# Patient Record
Sex: Female | Born: 1960 | Race: White | Hispanic: No | Marital: Married | Smoking: Former smoker
Health system: Southern US, Community
[De-identification: ages and names within clinical notes are randomized; demographics above are authoritative.]

## PROBLEM LIST (undated history)

## (undated) DIAGNOSIS — M1710 Unilateral primary osteoarthritis, unspecified knee: Secondary | ICD-10-CM

## (undated) DIAGNOSIS — G4700 Insomnia, unspecified: Secondary | ICD-10-CM

## (undated) DIAGNOSIS — Z5189 Encounter for other specified aftercare: Secondary | ICD-10-CM

## (undated) DIAGNOSIS — T4145XA Adverse effect of unspecified anesthetic, initial encounter: Secondary | ICD-10-CM

## (undated) DIAGNOSIS — R809 Proteinuria, unspecified: Secondary | ICD-10-CM

## (undated) DIAGNOSIS — T7840XA Allergy, unspecified, initial encounter: Secondary | ICD-10-CM

## (undated) DIAGNOSIS — I10 Essential (primary) hypertension: Secondary | ICD-10-CM

## (undated) DIAGNOSIS — C50919 Malignant neoplasm of unspecified site of unspecified female breast: Secondary | ICD-10-CM

## (undated) DIAGNOSIS — M179 Osteoarthritis of knee, unspecified: Secondary | ICD-10-CM

## (undated) DIAGNOSIS — F419 Anxiety disorder, unspecified: Secondary | ICD-10-CM

## (undated) DIAGNOSIS — Z9221 Personal history of antineoplastic chemotherapy: Secondary | ICD-10-CM

## (undated) DIAGNOSIS — A0472 Enterocolitis due to Clostridium difficile, not specified as recurrent: Secondary | ICD-10-CM

## (undated) DIAGNOSIS — M810 Age-related osteoporosis without current pathological fracture: Secondary | ICD-10-CM

## (undated) DIAGNOSIS — F329 Major depressive disorder, single episode, unspecified: Secondary | ICD-10-CM

## (undated) DIAGNOSIS — IMO0001 Reserved for inherently not codable concepts without codable children: Secondary | ICD-10-CM

## (undated) DIAGNOSIS — G4730 Sleep apnea, unspecified: Secondary | ICD-10-CM

## (undated) DIAGNOSIS — Z923 Personal history of irradiation: Secondary | ICD-10-CM

## (undated) DIAGNOSIS — E611 Iron deficiency: Secondary | ICD-10-CM

## (undated) DIAGNOSIS — K219 Gastro-esophageal reflux disease without esophagitis: Secondary | ICD-10-CM

## (undated) DIAGNOSIS — M722 Plantar fascial fibromatosis: Secondary | ICD-10-CM

## (undated) DIAGNOSIS — F32A Depression, unspecified: Secondary | ICD-10-CM

## (undated) DIAGNOSIS — E785 Hyperlipidemia, unspecified: Secondary | ICD-10-CM

## (undated) DIAGNOSIS — T8859XA Other complications of anesthesia, initial encounter: Secondary | ICD-10-CM

## (undated) HISTORY — PX: OTHER SURGICAL HISTORY: SHX169

## (undated) HISTORY — PX: MYRINGOTOMY: SUR874

## (undated) HISTORY — DX: Allergy, unspecified, initial encounter: T78.40XA

## (undated) HISTORY — DX: Hyperlipidemia, unspecified: E78.5

## (undated) HISTORY — DX: Essential (primary) hypertension: I10

## (undated) HISTORY — DX: Age-related osteoporosis without current pathological fracture: M81.0

## (undated) HISTORY — DX: Plantar fascial fibromatosis: M72.2

## (undated) HISTORY — PX: GASTRIC ROUX-EN-Y: SHX5262

## (undated) HISTORY — PX: COLONOSCOPY: SHX174

## (undated) HISTORY — PX: APPENDECTOMY: SHX54

## (undated) HISTORY — DX: Depression, unspecified: F32.A

## (undated) HISTORY — DX: Reserved for inherently not codable concepts without codable children: IMO0001

## (undated) HISTORY — DX: Iron deficiency: E61.1

## (undated) HISTORY — DX: Malignant neoplasm of unspecified site of unspecified female breast: C50.919

## (undated) HISTORY — DX: Osteoarthritis of knee, unspecified: M17.9

## (undated) HISTORY — DX: Enterocolitis due to Clostridium difficile, not specified as recurrent: A04.72

## (undated) HISTORY — DX: Major depressive disorder, single episode, unspecified: F32.9

## (undated) HISTORY — DX: Encounter for other specified aftercare: Z51.89

## (undated) HISTORY — DX: Insomnia, unspecified: G47.00

## (undated) HISTORY — DX: Anxiety disorder, unspecified: F41.9

## (undated) HISTORY — PX: TONSILLECTOMY AND ADENOIDECTOMY: SUR1326

## (undated) HISTORY — DX: Unilateral primary osteoarthritis, unspecified knee: M17.10

## (undated) HISTORY — DX: Sleep apnea, unspecified: G47.30

## (undated) HISTORY — DX: Proteinuria, unspecified: R80.9

---

## 1998-05-21 HISTORY — PX: TOTAL VAGINAL HYSTERECTOMY: SHX2548

## 1998-05-21 HISTORY — PX: ABDOMINAL HYSTERECTOMY: SHX81

## 1998-08-15 ENCOUNTER — Other Ambulatory Visit: Admission: RE | Admit: 1998-08-15 | Discharge: 1998-08-15 | Payer: Self-pay | Admitting: Obstetrics and Gynecology

## 1998-09-08 ENCOUNTER — Emergency Department (HOSPITAL_COMMUNITY): Admission: EM | Admit: 1998-09-08 | Discharge: 1998-09-08 | Payer: Self-pay | Admitting: Emergency Medicine

## 1998-09-08 ENCOUNTER — Encounter: Payer: Self-pay | Admitting: Emergency Medicine

## 1998-10-20 ENCOUNTER — Inpatient Hospital Stay (HOSPITAL_COMMUNITY): Admission: RE | Admit: 1998-10-20 | Discharge: 1998-10-22 | Payer: Self-pay | Admitting: Obstetrics and Gynecology

## 1999-05-22 DIAGNOSIS — C50919 Malignant neoplasm of unspecified site of unspecified female breast: Secondary | ICD-10-CM

## 1999-05-22 HISTORY — DX: Malignant neoplasm of unspecified site of unspecified female breast: C50.919

## 1999-09-18 ENCOUNTER — Other Ambulatory Visit: Admission: RE | Admit: 1999-09-18 | Discharge: 1999-09-18 | Payer: Self-pay | Admitting: *Deleted

## 1999-11-03 ENCOUNTER — Other Ambulatory Visit: Admission: RE | Admit: 1999-11-03 | Discharge: 1999-11-03 | Payer: Self-pay | Admitting: General Surgery

## 1999-11-03 ENCOUNTER — Encounter: Admission: RE | Admit: 1999-11-03 | Discharge: 1999-11-03 | Payer: Self-pay | Admitting: General Surgery

## 1999-11-03 ENCOUNTER — Encounter: Payer: Self-pay | Admitting: General Surgery

## 1999-11-08 ENCOUNTER — Encounter: Payer: Self-pay | Admitting: General Surgery

## 1999-11-09 ENCOUNTER — Encounter: Payer: Self-pay | Admitting: General Surgery

## 1999-11-09 ENCOUNTER — Ambulatory Visit (HOSPITAL_COMMUNITY): Admission: RE | Admit: 1999-11-09 | Discharge: 1999-11-09 | Payer: Self-pay | Admitting: General Surgery

## 1999-11-09 ENCOUNTER — Encounter (INDEPENDENT_AMBULATORY_CARE_PROVIDER_SITE_OTHER): Payer: Self-pay | Admitting: *Deleted

## 1999-11-19 HISTORY — PX: BREAST LUMPECTOMY: SHX2

## 1999-11-28 ENCOUNTER — Encounter: Admission: RE | Admit: 1999-11-28 | Discharge: 2000-02-26 | Payer: Self-pay | Admitting: *Deleted

## 1999-11-30 ENCOUNTER — Ambulatory Visit (HOSPITAL_COMMUNITY): Admission: RE | Admit: 1999-11-30 | Discharge: 1999-11-30 | Payer: Self-pay | Admitting: Oncology

## 1999-11-30 ENCOUNTER — Encounter: Payer: Self-pay | Admitting: Oncology

## 2000-02-27 ENCOUNTER — Encounter: Admission: RE | Admit: 2000-02-27 | Discharge: 2000-05-27 | Payer: Self-pay | Admitting: *Deleted

## 2000-06-27 ENCOUNTER — Ambulatory Visit (HOSPITAL_COMMUNITY): Admission: RE | Admit: 2000-06-27 | Discharge: 2000-06-27 | Payer: Self-pay | Admitting: *Deleted

## 2000-08-29 ENCOUNTER — Observation Stay (HOSPITAL_COMMUNITY): Admission: AD | Admit: 2000-08-29 | Discharge: 2000-08-30 | Payer: Self-pay | Admitting: Internal Medicine

## 2000-12-17 ENCOUNTER — Other Ambulatory Visit: Admission: RE | Admit: 2000-12-17 | Discharge: 2000-12-17 | Payer: Self-pay | Admitting: Obstetrics and Gynecology

## 2001-01-29 ENCOUNTER — Encounter: Payer: Self-pay | Admitting: Internal Medicine

## 2001-01-29 ENCOUNTER — Ambulatory Visit (HOSPITAL_COMMUNITY): Admission: RE | Admit: 2001-01-29 | Discharge: 2001-01-29 | Payer: Self-pay | Admitting: Internal Medicine

## 2001-06-27 ENCOUNTER — Encounter: Admission: RE | Admit: 2001-06-27 | Discharge: 2001-09-25 | Payer: Self-pay | Admitting: *Deleted

## 2002-01-02 ENCOUNTER — Other Ambulatory Visit: Admission: RE | Admit: 2002-01-02 | Discharge: 2002-01-02 | Payer: Self-pay | Admitting: Obstetrics and Gynecology

## 2002-02-20 ENCOUNTER — Encounter: Payer: Self-pay | Admitting: *Deleted

## 2002-02-20 ENCOUNTER — Encounter: Admission: RE | Admit: 2002-02-20 | Discharge: 2002-02-20 | Payer: Self-pay | Admitting: *Deleted

## 2002-05-07 ENCOUNTER — Encounter: Admission: RE | Admit: 2002-05-07 | Discharge: 2002-08-05 | Payer: Self-pay | Admitting: Surgery

## 2002-05-18 ENCOUNTER — Encounter: Payer: Self-pay | Admitting: Surgery

## 2002-05-18 ENCOUNTER — Encounter: Admission: RE | Admit: 2002-05-18 | Discharge: 2002-05-18 | Payer: Self-pay | Admitting: Surgery

## 2002-06-02 ENCOUNTER — Ambulatory Visit (HOSPITAL_BASED_OUTPATIENT_CLINIC_OR_DEPARTMENT_OTHER): Admission: RE | Admit: 2002-06-02 | Discharge: 2002-06-02 | Payer: Self-pay | Admitting: Surgery

## 2002-06-02 ENCOUNTER — Encounter: Admission: RE | Admit: 2002-06-02 | Discharge: 2002-06-18 | Payer: Self-pay | Admitting: Surgery

## 2002-08-03 ENCOUNTER — Inpatient Hospital Stay (HOSPITAL_COMMUNITY): Admission: RE | Admit: 2002-08-03 | Discharge: 2002-08-08 | Payer: Self-pay | Admitting: Surgery

## 2002-08-04 ENCOUNTER — Encounter: Payer: Self-pay | Admitting: Surgery

## 2002-08-05 ENCOUNTER — Encounter: Payer: Self-pay | Admitting: Surgery

## 2002-08-20 ENCOUNTER — Encounter: Admission: RE | Admit: 2002-08-20 | Discharge: 2002-11-18 | Payer: Self-pay | Admitting: Surgery

## 2002-11-12 ENCOUNTER — Encounter: Payer: Self-pay | Admitting: Emergency Medicine

## 2002-11-12 ENCOUNTER — Emergency Department (HOSPITAL_COMMUNITY): Admission: EM | Admit: 2002-11-12 | Discharge: 2002-11-12 | Payer: Self-pay | Admitting: Emergency Medicine

## 2002-12-09 ENCOUNTER — Encounter: Admission: RE | Admit: 2002-12-09 | Discharge: 2003-03-09 | Payer: Self-pay | Admitting: Surgery

## 2002-12-20 HISTORY — PX: OTHER SURGICAL HISTORY: SHX169

## 2003-01-22 ENCOUNTER — Encounter: Admission: RE | Admit: 2003-01-22 | Discharge: 2003-01-22 | Payer: Self-pay | Admitting: *Deleted

## 2003-01-22 ENCOUNTER — Encounter: Payer: Self-pay | Admitting: *Deleted

## 2003-02-12 ENCOUNTER — Encounter: Payer: Self-pay | Admitting: *Deleted

## 2003-02-12 ENCOUNTER — Encounter: Admission: RE | Admit: 2003-02-12 | Discharge: 2003-02-12 | Payer: Self-pay | Admitting: *Deleted

## 2003-03-12 ENCOUNTER — Encounter: Payer: Self-pay | Admitting: Oncology

## 2003-03-12 ENCOUNTER — Ambulatory Visit (HOSPITAL_COMMUNITY): Admission: RE | Admit: 2003-03-12 | Discharge: 2003-03-12 | Payer: Self-pay | Admitting: Oncology

## 2003-04-28 ENCOUNTER — Other Ambulatory Visit: Admission: RE | Admit: 2003-04-28 | Discharge: 2003-04-28 | Payer: Self-pay | Admitting: Obstetrics and Gynecology

## 2003-06-21 ENCOUNTER — Encounter: Admission: RE | Admit: 2003-06-21 | Discharge: 2003-09-19 | Payer: Self-pay | Admitting: Obstetrics and Gynecology

## 2004-05-01 ENCOUNTER — Emergency Department (HOSPITAL_COMMUNITY): Admission: EM | Admit: 2004-05-01 | Discharge: 2004-05-01 | Payer: Self-pay | Admitting: Emergency Medicine

## 2004-08-25 ENCOUNTER — Ambulatory Visit: Payer: Self-pay | Admitting: Oncology

## 2004-12-19 ENCOUNTER — Ambulatory Visit: Payer: Self-pay | Admitting: Oncology

## 2005-04-24 ENCOUNTER — Encounter: Admission: RE | Admit: 2005-04-24 | Discharge: 2005-04-24 | Payer: Self-pay | Admitting: Family Medicine

## 2005-06-19 ENCOUNTER — Ambulatory Visit: Payer: Self-pay | Admitting: Oncology

## 2005-12-19 ENCOUNTER — Ambulatory Visit: Payer: Self-pay | Admitting: Oncology

## 2005-12-24 LAB — COMPREHENSIVE METABOLIC PANEL
AST: 24 U/L (ref 0–37)
Albumin: 4.5 g/dL (ref 3.5–5.2)
Alkaline Phosphatase: 70 U/L (ref 39–117)
BUN: 13 mg/dL (ref 6–23)
Creatinine, Ser: 0.6 mg/dL (ref 0.40–1.20)
Glucose, Bld: 105 mg/dL — ABNORMAL HIGH (ref 70–99)
Potassium: 4.1 mEq/L (ref 3.5–5.3)
Total Bilirubin: 0.3 mg/dL (ref 0.3–1.2)

## 2005-12-24 LAB — CBC WITH DIFFERENTIAL/PLATELET
BASO%: 0.3 % (ref 0.0–2.0)
EOS%: 2.1 % (ref 0.0–7.0)
HCT: 38.9 % (ref 34.8–46.6)
MCH: 29.9 pg (ref 26.0–34.0)
MCHC: 34.3 g/dL (ref 32.0–36.0)
MCV: 87.1 fL (ref 81.0–101.0)
MONO%: 9.8 % (ref 0.0–13.0)
NEUT%: 49.3 % (ref 39.6–76.8)
lymph#: 2.3 10*3/uL (ref 0.9–3.3)

## 2006-05-21 HISTORY — PX: FOOT SURGERY: SHX648

## 2006-06-20 ENCOUNTER — Ambulatory Visit: Payer: Self-pay | Admitting: Oncology

## 2006-07-01 LAB — CBC WITH DIFFERENTIAL/PLATELET
Basophils Absolute: 0 10*3/uL (ref 0.0–0.1)
Eosinophils Absolute: 0.1 10*3/uL (ref 0.0–0.5)
HCT: 36.6 % (ref 34.8–46.6)
HGB: 13 g/dL (ref 11.6–15.9)
LYMPH%: 34.6 % (ref 14.0–48.0)
MCV: 83.9 fL (ref 81.0–101.0)
MONO#: 0.5 10*3/uL (ref 0.1–0.9)
MONO%: 8.6 % (ref 0.0–13.0)
NEUT#: 3 10*3/uL (ref 1.5–6.5)
NEUT%: 53.9 % (ref 39.6–76.8)
Platelets: 285 10*3/uL (ref 145–400)
WBC: 5.6 10*3/uL (ref 3.9–10.0)

## 2006-07-05 LAB — LACTATE DEHYDROGENASE: LDH: 176 U/L (ref 94–250)

## 2006-07-05 LAB — COMPREHENSIVE METABOLIC PANEL
Albumin: 4.4 g/dL (ref 3.5–5.2)
Alkaline Phosphatase: 61 U/L (ref 39–117)
BUN: 15 mg/dL (ref 6–23)
CO2: 26 mEq/L (ref 19–32)
Glucose, Bld: 204 mg/dL — ABNORMAL HIGH (ref 70–99)
Total Bilirubin: 0.3 mg/dL (ref 0.3–1.2)
Total Protein: 6.6 g/dL (ref 6.0–8.3)

## 2006-07-05 LAB — VITAMIN D PNL(25-HYDRXY+1,25-DIHY)-BLD

## 2006-07-05 LAB — CANCER ANTIGEN 27.29: CA 27.29: 24 U/mL (ref 0–39)

## 2006-12-03 ENCOUNTER — Ambulatory Visit (HOSPITAL_COMMUNITY): Admission: RE | Admit: 2006-12-03 | Discharge: 2006-12-03 | Payer: Self-pay | Admitting: Oncology

## 2006-12-18 ENCOUNTER — Ambulatory Visit: Payer: Self-pay | Admitting: Oncology

## 2006-12-20 LAB — CBC WITH DIFFERENTIAL/PLATELET
BASO%: 0.5 % (ref 0.0–2.0)
Eosinophils Absolute: 0.2 10*3/uL (ref 0.0–0.5)
LYMPH%: 36.7 % (ref 14.0–48.0)
MCHC: 34.8 g/dL (ref 32.0–36.0)
MCV: 85.3 fL (ref 81.0–101.0)
MONO%: 7 % (ref 0.0–13.0)
NEUT#: 3.1 10*3/uL (ref 1.5–6.5)
Platelets: 265 10*3/uL (ref 145–400)
RBC: 4.42 10*6/uL (ref 3.70–5.32)
RDW: 13.4 % (ref 11.3–14.5)
WBC: 5.9 10*3/uL (ref 3.9–10.0)

## 2006-12-20 LAB — COMPREHENSIVE METABOLIC PANEL
ALT: 25 U/L (ref 0–35)
AST: 23 U/L (ref 0–37)
Albumin: 4.4 g/dL (ref 3.5–5.2)
Alkaline Phosphatase: 59 U/L (ref 39–117)
Potassium: 4.1 mEq/L (ref 3.5–5.3)
Sodium: 142 mEq/L (ref 135–145)
Total Bilirubin: 0.5 mg/dL (ref 0.3–1.2)
Total Protein: 6.4 g/dL (ref 6.0–8.3)

## 2007-01-01 LAB — ESTRADIOL, ULTRA SENS: Estradiol, Ultra Sensitive: <2 pg/mL

## 2010-10-06 NOTE — Procedures (Signed)
[facility]  Patient:    Julie Fischer, Julie Fischer                    MRN: [id number] Proc. Date: 06/27/00 Adm. Date:  [id number] Attending:  Sabino Gasser                           Procedure Report  PROCEDURE:  Colonoscopy.  INDICATION FOR PROCEDURE:  Rectal bleeding.  ANESTHESIA:  Demerol 90, Versed 7 mg.  DESCRIPTION OF PROCEDURE:  With the patient mildly sedated in the left lateral decubitus position, the Olympus videoscopic colonoscope was inserted in the rectum and passed under direct vision to the cecum identified by the ileocecal valve and appendiceal orifice both of which were photographed. From this point, the colonoscope was slowly withdrawn taking circumferential views of the entire colonic mucosa, stopping only in the rectum which appeared normal in direct and retroflexed view. The endoscope was straightened and withdrawn. The patients vital signs and pulse oximeter remained stable. The patient tolerated the procedure well without apparent complications.  FINDINGS:  Essentially negative colonoscopic examination to the cecum.  PLAN:  Consider repeat examination in five years. DD:  06/27/00 TD:  06/28/00 Job: [id number]

## 2010-10-06 NOTE — H&P (Signed)
[place]  Patient:    Julie Fischer, Julie Fischer                      MRN: [id number] Adm. Date:  08/29/00 Attending:  Jenel Lucks, M.D. Dictator:   Rickard Patience, P.A.                         History and Physical  DATE OF BIRTH:  [birth year]  CHIEF COMPLAINT:  Chest pain.  HISTORY OF PRESENT ILLNESS:  Julie Fischer is a 50 year old married, white, female patient of Dr. Jenel Lucks who presents to the office today complaining of a three to four-day history of nonradiation substernal chest pain. Approximately four days ago, she had an episode of fevers and chills accompanied by some diarrhea which subsided after about two days.  She cannot identify any alleviating or exacerbating factors along with her chest pain. She denies any significant dyspnea or shortness of breath.  Chest pain occurs at rest and with activity.  She has a past medical history of type 2 diabetes diagnosed in January 2002 and is also status post right breast cancer status post lumpectomy in January 2001.  She also has had chemotherapy for breast cancer and is currently on Prevacid secondary to that.  PAST MEDICAL HISTORY: 1. Sleep apnea. 2. Dyslipidemia including elevated triglycerides. 3. Hypertension. 4. Breast cancer status post lumpectomy. 5. History of migraines. 6. Obesity.  PAST SURGICAL HISTORY: 1. Partial hysterectomy secondary to endometriosis June 2000. 2. Appendectomy. 3. Tonsillectomy. 4. Adenoidectomy. 5. Myringotomy tube placement x 5.  ALLERGIES:  PENICILLIN.  CURRENT MEDICATIONS: 1. Paxil 20 mg daily. 2. Prevacid 30 mg daily. 3. Tamoxifen 10 mg 2 daily. 4. Actos 15 mg daily. 5. Glucotrol XL 5 mg b.i.d. 6. ______ 5 mg daily.  SOCIAL HISTORY:  The patient is married, lives in [place] with her husband. She has no children.  She works as a Quarry manager.  She does not currently smoke.  She has a remote history of smoking, having quit  in approximately 1993.  No alcohol use.  FAMILY HISTORY:  Negative for any breast cancer, colon cancer, or endometrial cancer.  Father deceased at age 37 secondary to pneumonia which was a complication of metastatic cancer.  Mother had a history of cerebral aneurysm which did hemorrhage and also had a seizure disorder.  No family history of diabetes.  Maternal grandparents had emphysema.  Maternal uncle and maternal grandfather had a history of alcoholism.  No known coronary artery disease history is in the family.  REVIEW OF SYSTEMS:  Currently, her chest pain is a 3 to 4/10, nonradiating, and substernal.  No acute dyspnea or any kind of cough.  She denies recent cold symptoms, sore throat, or any fevers or chills in the last 48 hours.  She denies any heartburn-type symptoms or reflux.  She has had no recent changes in her bowel or bladder habits.  The remainder of the Review of Systems is otherwise negative except as noted in the HPI.  PHYSICAL EXAMINATION:  VITAL SIGNS:  Today, her weight is 183.  Oxygen saturation is 98% on room air. Blood pressure 120/90, temperature 97.9, pulse 68 and regular sitting, respirations 16 and unlabored.  GENERAL:  Well-nourished, obese, black female in no acute distress.  SKIN:  Clear with no lesions.  HEENT:  Head is normocephalic.  Pupils are equal, round and reactive to light. Extraocular motions intact.  The patient wears glasses.  Her oropharynx is moist and clear.  NECK:  Supple with no JVD or thyromegaly noted.  CARDIOVASCULAR:   Normal S1 and S2 with no murmur, rub, or gallop.  Pulse is 68 and regular with the patient sitting.  LUNGS:  Clear to auscultation bilaterally without rales, rhonchi, or wheezing. Chest shape is normal, and respirations are unlabored.   Chest wall in nontender to compression today.  ABDOMEN:  She is obese with good bowel sounds throughout.  She is soft and nontender.  No organomegaly is noted  today.  GU/RECTAL:  Exams deferred.  EXTREMITIES:  Normal muscle mass.  No synovitis and no edema.  NEUROLOGIC:  Alert and oriented x 3.  Cranial nerves II-XII intact.  No tremors.  Neurologic exam is nonfocal.  LABORATORY DATA:  An EKG is done which shows normal sinus rhythm at 68 with a minimal ST segment elevation in the inferior leads II, III, and aVF.  This was reviewed with Dr. Lucas Mallow.  Chest x-ray shows possible borderline cardiomegaly, otherwise negative.  No acute infiltrates are noted.  This is sent along with the patient today.  IMPRESSION: 1. Unstable angina.  We will admit her for overnight observation to rule    out myocardial infarction and get cardiac enzymes on her today.  She is    given nitroglycerin x 3 in the office with no relief of her chest pain;    325 mg aspirin was also given in the office, and the patient was placed on    oxygen at 2 liters until she can be transported. 2. Diabetes mellitus.  Currently under fair control.  This has been newly    diagnosed, and she will follow up with Dr. Shelva Majestic. 3. History of hyperlipidemia. 4. Obesity. 5. Sleep apnea. 6. Breast cancer status post lumpectomy.  If the patient has elevated cardiac enzymes, cardiac consult will be obtained, and she will be followed by Dr. Jenel Lucks. DD:  08/29/00 TD:  08/29/00 Job: [id number]

## 2010-10-06 NOTE — Consult Note (Signed)
NAME:  Julie Fischer, Julie Fischer                       ACCOUNT NO.:  [id number]   MEDICAL RECORD NO.:  [id number]                   PATIENT TYPE:  INP   LOCATION:  0163                                 FACILITY:  [facility]   PHYSICIAN:  Genene Churn. Cyndie Chime, M.D.          DATE OF BIRTH:  1960/08/22   DATE OF CONSULTATION:  08/04/2002  DATE OF DISCHARGE:                                   CONSULTATION   HISTORY OF PRESENT ILLNESS:  The patient is a 50 year old female who  presented to [facility] on 08[facility]02, for routine elective  laparoscopic Roux-en-Y gastrojejunostomy for morbid obesity.  The patient  received 5000 units of heparin preoperatively.  Additionally, her  preoperative platelet count was found to be 346,000.  Heparin 5000 units  q.8h. was ordered for the patient postoperatively, and it seemed she  received two postoperative doses of this per her report.  Her platelet count  decreased to 72,000 on the morning of 08/04/02, and a repeat of this showed  platelet count of 36,000.  At 4 p.m., the platelet count was found to be  23,000.  We were asked to evaluate this patient for the abrupt onset of  thrombocytopenia.  The patient does not recall any prior heparin exposures  with her prior surgeries or in regards to treatment of her breast cancer.  She has no known bleeding or clotting problems.   PAST MEDICAL HISTORY:  1. 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  2. Type 2 diabetes x2 years.  3. Hyperlipidemia.  4. Morbid obesity/OHS.  5. History of hysterectomy at age 4 secondary to endometriosis.  6. History of appendectomy at age 1.  7. Gastroesophageal reflux disease.  8. Degenerative joint disease of the right knee.   ALLERGIES:  PENICILLIN causes rash and swelling.   MEDICATIONS PRIOR TO ADMISSION:  1. Glucotrol.  2. Altace.  3. Lipitor.  4. TriCor.  5. Tamoxifen, discontinued 1/04, in preparation for current surgery.  6. Prevacid.  7. Paxil.  8. Effexor.  9. Bextra, started three to four months ago, prior to this used ibuprofen     for pain control of her degenerative joint disease.   MEDICATIONS WHILE IN THE HOSPITAL:  1. Heparin.  2. Morphine.  3. Regular sliding scale insulin.  4. Protonix.  5. Phenergan.  6. Zofran.   FAMILY HISTORY:  Father is deceased secondary to complications of metastatic  cancer and pneumonia.  Mother is alive and has been treated for gastric  cancer.  She has three siblings in good health.  No children.  No known  family history of bleeding or clotting disorders.   SOCIAL HISTORY:  The patient is married.  No children.  She works  as a  Veterinary surgeon.  She is a former one pack a day smoker, quit approximately nine  years ago.  She reports occasional alcohol use in social situation.  No  recreational drug use.   REVIEW OF SYMPTOMS:  CONSTITUTIONAL:  Occasional fever and chills this month  secondary to having a cold.  No night sweats.  No weight loss.  RESPIRATORY:  Long-standing dyspnea on exertion, no orthopnea, no cough.  The patient uses CPAP at night for obstructive sleep apnea and has done so  for two years.  CARDIOVASCULAR:  No chest pain, no arrhythmia, no edema.  GASTROINTESTINAL:  No change in bowel habits, no melena, no hematochezia, no  nausea or vomiting.  GENITOURINARY:  No dysuria, no hematuria, no vaginal  bleeding.  MSK:  No complaints except for right knee pain secondary to  osteoarthritis.  HEME:  No history of anemia or clotting disorder.  ENDOCRINE:  No history of thyroid disease.  No changes in energy level.  NEUROLOGIC:  No current headache.  No history of seizures.  No paresthesias.  She reports occasional migraine headaches, approximately one time per month.   PHYSICAL EXAMINATION:  VITAL  SIGNS:  Temperature 98.6, blood pressure  160/84, pulse 104, respirations 20, O2 saturation 99% on 3 L.  GENERA:L  An obese pale female in no acute distress.  HEENT:  Normocephalic, atraumatic.  Oropharynx is clear.  NECK:  Supple without thyromegaly or lymphadenopathy.  CHEST:  There is faint bibasilar crackles at the bases.  Good air movement.  HEART:  Regular rate and rhythm with normal S1 and S2.  No murmurs, rubs, or  gallops.  ABDOMEN:  Soft, there is a Jackson-Pratt drain in place draining  serosanguinous fluid.  She has dressings from her prior surgery which are  clean, dry, and intact.  She is slightly tender at the left lower quadrant.  Hypoactive bowel sounds.  EXTREMITIES:  Trace edema.  NEUROLOGIC:  Strength is 5/5.  Otherwise nonfocal.  SKIN:  No petechiae.   LABORATORY DATA:  PT 19.9, INR 1.9, PTT 51, platelet count 23, fibrinogen  392, D-dimer 9.29.  White blood cell count 9.4, hemoglobin 12.9, hematocrit  37.3, MCV 84.  On the peripheral smear, there was 1 to 3 platelets per high  powered field and no schistocytes seen.  Sodium was 136, potassium 4,  chloride 104, bicarbonate 27, BUN 4, creatinine 0.7, calcium 8.1, glucose  116.  Liver function tests were within normal limits.  Urinalysis was within  normal limits.  No red blood cells noted.   ASSESSMENT AND PLAN:  This is a 50 year old female with abrupt onset of  thrombocytopenia, status post laparoscopic gastric bypass surgery and three  doses of subcutaneous heparin administration.  With no known prior heparin  exposures, HIT from preformed antibodies is unlikely.  HIT that is immune  mediated typically occurs four to 10 days after the initiation of heparin  therapy.  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  At this time, favor DIC related to surgery as the most likely  diagnosis.  Other considerations include atypical reaction to anesthetic  agents or Protonix.    RECOMMENDATIONS:  1. DIC panel.  2. Platelet aggregation studies.  3. Platelet factor 4 Eliza.  4. Platelet heparin associated antibody.  5. Discontinue heparin.  6. Discontinue Protonix.  7. Repeat coagulation studies in the morning along with fibrinogen and a     reticulocyte count.  If her coagulation studies  are still abnormal,     consider giving fresh frozen plasma.  8. Would treat with one unit of platelets and four units of fresh frozen     plasma tonight in light of possible need for surgical exploration     tomorrow.   Thank you for this interesting consult.  We will follow with you.      Hillery Aldo, M.D.                      Genene Churn. Cyndie Chime, M.D.    CR/MEDQ  D:  08/04/2002  T:  08/05/2002  Job:  [id number]   cc:   Sandria Bales. Ezzard Standing, M.D.  [address]  [place]  [place] [address]  Fax: [phone]   Pierce Crane, M.D.  [address] [facility]  [place]  [place] [address]  Fax: [phone]   Armstead Peaks, M.D.  [address] [place], [place] [address]  Fax: [phone]

## 2010-10-06 NOTE — Discharge Summary (Signed)
NAME:  Julie Fischer, Julie Fischer                       ACCOUNT NO.:  [id number]   MEDICAL RECORD NO.:  [id number]                   PATIENT TYPE:  INP   LOCATION:  0469                                 FACILITY:  [facility]   PHYSICIAN:  Sandria Bales. Ezzard Standing, M.D.               DATE OF BIRTH:  05-14-1961   DATE OF ADMISSION:  08/03/2002  DATE OF DISCHARGE:  08/08/2002                                 DISCHARGE SUMMARY   DISCHARGE DIAGNOSES:  1. Morbid obesity.  2. Postoperative disseminated intravascular coagulation (DIC).  3. Chronic obstructive pulmonary disease with continuous positive airway     pressure (CPAP).  4. Hypertension.  5. Gastroesophageal reflux disease.  6. Hypercholesterolemia.  7. Type 2 diabetes mellitus.  8. Stage I carcinoma of the right breast.  9. History of depression.   OPERATION PERFORMED:  The patient underwent a laparoscopic Roux-en-Y  gastrojejunostomy, which was antecolic and antegastric, enterolysis of  adhesions, esophagogastroscopy on August 03, 2002.   HISTORY OF ILLNESS:  Ms. Kolander is a pleasant 50 year old white female who  sees Dr. Armstead Peaks from [facility] and Dr. Mancel Bale  from OB/GYN who has been overweight since her teenage years.  Her weight is  approximately 200 pounds but she is only 4 feet 8 inches tall and a BMI of  45.  She has tried multiple weight loss methods which have been unsuccessful  and now comes for attempted bariatric surgery.   Most significantly she has multiple comorbid problems associated with her  weight which include COPD, hypertension, gastroesophageal reflux disease,  and type 2 diabetes mellitus.   She has been through all our preoperative staging.  She sees Dr. Cyndia Skeeters as a  Psychiatrist and has a good understanding both of the indications and  potential complications of the operation.   HOSPITAL COURSE:  On August 03, 2002, she underwent a laparoscopic Roux-en-Y  gastrojejunostomy which was done  antecolic and antegastric, she had  enterolysis of adhesions, and esophagogastroscopy all on the 15th of March.   Postoperatively, she had two problems.  One is her initial swallow postop  showed what looked like a partial bowel obstruction, but we gave this 24  hours, followed up with x-rays, and this all resolved.  Her second problem  was that she developed DIC picture, initially identified as a  thrombocytopenia which her platelets the first postop day were 72,000, they  dropped down to 36,000.  She also bumped up her PT to 16.5, PTT to 47.  Dr.  Julio Sicks helped me with the medical management of her medical problems,  and Dr. Cyndie Chime saw her along with Dr. Pierce Crane for her hematologic  problems.   We did lower extremity Dopplers the first postop day which were negative.  Surprisingly through all this she remained remarkably stable.  Her glucoses  stayed low postop off her medications.  Her blood pressure stayed stable.  So, I  have held actually all her medicines at the time of discharge.   There over a few days postop slowly we did give her some FFP and some  platelets.  Slowly her PT and PTT returned to normal.  Her platelets started  increasing and again the whole cause of this DIC is somewhat of a mystery.  She is now five days postop.  Her hemoglobin is 10 and hematocrit 29, white  blood count of 8,400, and her platelets are 162,000.  Her PT is 14.9 and PTT  of 35.  Her glucose today is 97.   I think she is ready for discharge.  She is on our diet, which is mainly  liquids and Glucerna.  She will have some Roxicet liquid for pain.  She  should do no driving or heavy lifting for four to five days.  She has  staples in place.  I went on and removed her Jackson-Pratt drain today.  She  will see me back next Wednesday, which would be about March 24.  She knows  to call for any interval problems.  She has also already made an appointment  to see Dr. Yetta Barre the first of April.   At that time, I think will have to  just review her medicines and make adjustments as she does with her weight  loss.                                               Sandria Bales. Ezzard Standing, M.D.    DHN/MEDQ  D:  08/08/2002  T:  08/08/2002  Job:  [id number]   cc:   Armstead Peaks, M.D.  [address] [place], [place] [address]  Fax: [phone]   Pierce Crane, M.D.  [address] - [facility]  [place]  [place] [address]  Fax: [phone]   Jackie Plum, M.D.  [address]  [place]  [place] [address]  Fax: [phone]   Duke Salvia. Marcelle Overlie, M.D.  [address]  [place]  [place] [address]  Fax: [phone]   Georgiana Spinner, M.D.  [address]  [place]  [place] [address]  Fax: [phone]   Vilinda Flake, Ph.D.

## 2010-10-06 NOTE — Op Note (Signed)
   NAME:  Julie Fischer, Julie Fischer                       ACCOUNT NO.:  [id number]   MEDICAL RECORD NO.:  [id number]                   PATIENT TYPE:  INP   LOCATION:  X001                                 [facility]ACILITY[facility]  [facility]   PHYSICIAN:  Sandria Bales. Ezzard Standing, M.D.               DATE OF BIRTH:  Sep 22, 1960   DATE OF PROCEDURE:  08/03/2002  DATE OF DISCHARGE:                                 OPERATIVE REPORT   HISTORY:  She is a 50 year old white female,who has been morbidly obese for  a number of years.  She has multiple comorbid conditions and now comes for  laparoscopic Roux-Y gastrojejunostomy.   PREOPERATIVE DIAGNOSIS:  Morbid obesity.   POSTOPERATIVE DIAGNOSIS:  Morbid obesity.   PROCEDURE:  Laparoscopic Roux-en-Y, antecolic/antegastric gastrojejunostomy,  upper endoscopy.  Dictation ends at this point.                                                 Sandria Bales. Ezzard Standing, M.D.    DHN/MEDQ  D:  08/03/2002  T:  08/03/2002  Job:  [id number]   cc:   Armstead Peaks, M.D.  [address] [place], [place] [address]  Fax: [phone]   Dr. Wilburn Cornelia M. Marcelle Overlie, M.D.  [address]  [place]  [place] [address]  Fax: [phone]   Georgiana Spinner, M.D.  [address]  [place]  [place] [address]  Fax: [phone]   Pierce Crane, M.D.  [address] [facility]  [place]  [place] [address]  Fax: [phone]

## 2010-11-07 ENCOUNTER — Encounter: Payer: Self-pay | Admitting: *Deleted

## 2010-11-15 ENCOUNTER — Other Ambulatory Visit: Payer: Self-pay | Admitting: Family Medicine

## 2010-11-15 ENCOUNTER — Ambulatory Visit (INDEPENDENT_AMBULATORY_CARE_PROVIDER_SITE_OTHER): Payer: 59 | Admitting: Family Medicine

## 2010-11-15 ENCOUNTER — Encounter: Payer: Self-pay | Admitting: Family Medicine

## 2010-11-15 ENCOUNTER — Other Ambulatory Visit: Payer: Self-pay | Admitting: *Deleted

## 2010-11-15 VITALS — BP 150/90 | HR 72 | Ht <= 58 in | Wt 153.0 lb

## 2010-11-15 DIAGNOSIS — Z853 Personal history of malignant neoplasm of breast: Secondary | ICD-10-CM | POA: Insufficient documentation

## 2010-11-15 DIAGNOSIS — E119 Type 2 diabetes mellitus without complications: Secondary | ICD-10-CM

## 2010-11-15 DIAGNOSIS — Z23 Encounter for immunization: Secondary | ICD-10-CM

## 2010-11-15 DIAGNOSIS — G4700 Insomnia, unspecified: Secondary | ICD-10-CM | POA: Insufficient documentation

## 2010-11-15 DIAGNOSIS — M8580 Other specified disorders of bone density and structure, unspecified site: Secondary | ICD-10-CM

## 2010-11-15 DIAGNOSIS — E7800 Pure hypercholesterolemia, unspecified: Secondary | ICD-10-CM | POA: Insufficient documentation

## 2010-11-15 DIAGNOSIS — F329 Major depressive disorder, single episode, unspecified: Secondary | ICD-10-CM

## 2010-11-15 DIAGNOSIS — Z800 Family history of malignant neoplasm of digestive organs: Secondary | ICD-10-CM

## 2010-11-15 DIAGNOSIS — R413 Other amnesia: Secondary | ICD-10-CM

## 2010-11-15 DIAGNOSIS — Z0000 Encounter for general adult medical examination without abnormal findings: Secondary | ICD-10-CM

## 2010-11-15 DIAGNOSIS — F325 Major depressive disorder, single episode, in full remission: Secondary | ICD-10-CM | POA: Insufficient documentation

## 2010-11-15 DIAGNOSIS — M899 Disorder of bone, unspecified: Secondary | ICD-10-CM

## 2010-11-15 DIAGNOSIS — E559 Vitamin D deficiency, unspecified: Secondary | ICD-10-CM | POA: Insufficient documentation

## 2010-11-15 LAB — CBC WITH DIFFERENTIAL/PLATELET
Basophils Relative: 1 % (ref 0–1)
Eosinophils Absolute: 0.2 10*3/uL (ref 0.0–0.7)
HCT: 40 % (ref 36.0–46.0)
Hemoglobin: 13.1 g/dL (ref 12.0–15.0)
MCH: 28.2 pg (ref 26.0–34.0)
MCHC: 32.8 g/dL (ref 30.0–36.0)
Monocytes Absolute: 0.5 10*3/uL (ref 0.1–1.0)
Monocytes Relative: 8 % (ref 3–12)

## 2010-11-15 LAB — COMPREHENSIVE METABOLIC PANEL
Alkaline Phosphatase: 65 U/L (ref 39–117)
BUN: 10 mg/dL (ref 6–23)
Glucose, Bld: 93 mg/dL (ref 70–99)
Total Bilirubin: 0.4 mg/dL (ref 0.3–1.2)

## 2010-11-15 LAB — POCT URINALYSIS DIPSTICK
Ketones, UA: NEGATIVE
Protein, UA: NEGATIVE
Spec Grav, UA: 1.005
WBC, UA: NEGATIVE
pH, UA: 5

## 2010-11-15 LAB — LIPID PANEL
HDL: 57 mg/dL (ref >39)
LDL Cholesterol: 92 mg/dL (ref 0–99)
Triglycerides: 109 mg/dL (ref <150)
VLDL: 22 mg/dL (ref 0–40)

## 2010-11-15 LAB — VITAMIN B12: Vitamin B-12: 650 pg/mL (ref 211–911)

## 2010-11-15 MED ORDER — PAROXETINE HCL 40 MG PO TABS: 40.0000 mg | ORAL_TABLET | ORAL | Status: DC

## 2010-11-15 MED ORDER — SIMVASTATIN 20 MG PO TABS: 20.0000 mg | ORAL_TABLET | Freq: Every day | ORAL | Status: DC

## 2010-11-15 MED ORDER — ZOLPIDEM TARTRATE ER 12.5 MG PO TBCR: 12.5000 mg | EXTENDED_RELEASE_TABLET | Freq: Every evening | ORAL | Status: DC | PRN

## 2010-11-15 NOTE — Patient Instructions (Addendum)
Check blood pressure elsewhere (pharmacy).  Watch the sodium in your diet (try to limit to less than 2000mg   Daily).  Daily exercise and weight loss will also help your blood pressure.  Avoid decongestants (sinus medications) as these can raise your blood pressure.  Please write your BP down every time you check it, and mail/fax me a list of your blood pressures in the next 1-2 months.  Schedule an appointment with me if they are truly running >135/85.  Please schedule your mammogram, as well as your appointment with Dr. Donnie Coffin

## 2010-11-15 NOTE — Progress Notes (Addendum)
Julie Fischer is a 50 y.o. female who presents for a complete physical.  She has the following concerns: Possible lump on right breast, noticed in the last 6 weeks.  Not tender.  Last mammo was 07/2009. She has a h/o R breast cancer.  Also missed her yearly follow-up with Dr. Donnie Coffin, as she didn't seem to get a reminder from their office.  Also complaining of shin splints L>R for about 6 weeks.  Got new shoes after pain developed, and is using her orthotics in the shoes.  Hasn't made a difference.  Wears orthotics in all shoes, and doesn't seem to matter which shoes she's wearing.  Doesn't wear any high heeled shoes. Starts mid-day, may last for a couple of days, then self-resolves, but then may come back.  Denies any change in activity.  Was taking Bayer aspirin for the pain, which helped some, but then started causing epigastric stomach pain.  Stopped a week ago, and still having some residual pain   Immunization History  Administered Date(s) Administered  . Influenza Split 02/07/2009, 04/10/2010  . Pneumococcal Polysaccharide 06/04/2001  . Td 06/04/2001   Last Pap smear: s/p hysterectomy Last mammogram: 07/2009 Last colonoscopy: 2002 Last DEXA: through Dr. Donnie Coffin, showed osteopenia (per pt).  Last done 2009 Exercise:  Walks dog 1-2x/week Ophtho: yearly Dentist: twice yearly  Past Medical History  Diagnosis Date  . Breast cancer R breast(lumpectomy,chemo,radiation,tamoxifen) Dr.Rubin  . Hypertension resolved after weight loss surgery  . Diabetes mellitus resolved after weightr loss surgery  . Microalbuminuria h/o  . Iron deficiency h/o  . Depression   . Anxiety   . Sleep apnea   . Osteopenia (monitored by Dr Donnie Coffin)  . Plantar fasciitis (07') DrRegal  . Vitamin D deficiency   . OA (osteoarthritis) of knee   . Insomnia chronic    Past Surgical History  Procedure Date  . Breast lumpectomy right breast 11/1999  . Abdominal hysterectomy and RSO(endometriosis) 2000  . Appendectomy     . Cuboid stress fracture right 12/2002  . Left shoulder fracture repair (DrMortensen) 12/05  . 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 Myringotomy tubes B/L, multiple sets  . Foot surgery left 2008    History   Social History  . Marital Status: Married    Spouse Name: N/A    Number of Children: N/A  . Years of Education: N/A   Occupational History  . Not on file.   Social History Main Topics  . Smoking status: Former Smoker    Quit date: 01/28/1996  . Smokeless tobacco: Not on file  . Alcohol Use: Yes     glass of wine or beer maybe once a month.  . Drug Use: No  . Sexually Active: Not on file   Other Topics Concern  . Not on file   Social History Narrative  . No narrative on file    Family History  Problem Relation Age of Onset  . Dementia Mother   . Stomach cancer Mother 58  . Stroke Mother 3    due to aneurysm  . Macular degeneration Mother   . Cancer Mother     colon cancer  . Pancreatic cancer Father   . Hyperlipidemia Sister   . Hyperlipidemia Brother   . Diabetes Maternal Grandmother   . Hyperlipidemia Sister   . Breast cancer Cousin   . Breast cancer Cousin     Current outpatient prescriptions:Calcium Carbonate-Vitamin D (CALTRATE 600+D) 600-400 MG-UNIT per tablet, Take 1 tablet by mouth 2 (  two) times daily. , Disp: , Rfl: ;  Cholecalciferol (VITAMIN D) 400 UNITS capsule, Take 400 Units by mouth 2 (two) times daily.  , Disp: , Rfl: ;  CycloSPORINE (RESTASIS OP), Apply 1 drop to eye 2 (two) times daily.  , Disp: , Rfl: ;  Lysine 1000 MG TABS, Take 1 tablet by mouth daily.  , Disp: , Rfl:  Multiple Vitamins-Minerals (MULTIVITAMIN WITH MINERALS) tablet, Take 1 tablet by mouth daily.  , Disp: , Rfl: ;  Omega-3 Fatty Acids (FISH OIL) 1200 MG CAPS, Take 1 capsule by mouth daily. 2 nightly , Disp: , Rfl: ;  PARoxetine (PAXIL) 40 MG tablet, Take 1 tablet (40 mg total) by mouth every morning., Disp: 90 tablet, Rfl: 3;  simethicone (MYLICON) 80 MG chewable tablet,  Chew 80 mg by mouth as needed.  , Disp: , Rfl:  simvastatin (ZOCOR) 20 MG tablet, Take 1 tablet (20 mg total) by mouth at bedtime., Disp: 90 tablet, Rfl: 3;  vitamin C (ASCORBIC ACID) 500 MG tablet, Take 500 mg by mouth daily.  , Disp: , Rfl: ;  vitamin E 400 UNIT capsule, Take 400 Units by mouth daily.  , Disp: , Rfl: ;  zolpidem (AMBIEN CR) 12.5 MG CR tablet, Take 1 tablet (12.5 mg total) by mouth at bedtime as needed., Disp: 90 tablet, Rfl: 1 DISCONTD: Alpha-D-Galactosidase (BEANO) TABS, Take 1 tablet by mouth as needed.  , Disp: , Rfl: ;  DISCONTD: PARoxetine (PAXIL) 40 MG tablet, Take 40 mg by mouth every morning.  , Disp: , Rfl: ;  DISCONTD: simvastatin (ZOCOR) 20 MG tablet, Take 20 mg by mouth at bedtime.  , Disp: , Rfl: ;  DISCONTD: zolpidem (AMBIEN CR) 12.5 MG CR tablet, Take 12.5 mg by mouth at bedtime as needed.  , Disp: , Rfl:  letrozole (FEMARA) 2.5 MG tablet, Take 2.5 mg by mouth daily.  , Disp: , Rfl: ;  magnesium 30 MG tablet, Take 30 mg by mouth daily.  , Disp: , Rfl: ;  DISCONTD: cholecalciferol (VITAMIN D) 1000 UNITS tablet, Take 1,000 Units by mouth daily.  , Disp: , Rfl: ;  DISCONTD: Omega-3 Fatty Acids (FISH OIL) 1000 MG CAPS, Take 1 capsule by mouth 2 (two) times daily.  , Disp: , Rfl:   Allergies  Allergen Reactions  . Heparin Other (See Comments)    Low platelets  . Niacin And Related Other (See Comments)    Blotchy/itchy  . Penicillins Rash    ROS: The patient denies anorexia, fever, weight changes, headaches,  vision changes, decreased hearing, ear pain, sore throat, breast concerns, chest pain, palpitations, syncope, dyspnea on exertion, cough, swelling, vomiting, diarrhea, constipation, abdominal pain, melena, hematochezia, indigestion/heartburn, hematuria, incontinence, dysuria, vaginal bleeding, vaginal discharge, odor or itch, genital lesions,  numbness, tingling, weakness, tremor, suspicious skin lesions, depression, anxiety, abnormal bleeding/bruising, or enlarged  lymph nodes.  Some dizziness/vertigo associated with some nausea over the last few weeks. Occasional tinnitus, and some crackling in R ear. Allergies have also been flaring some recently, some frontal sinus pressure + shin splints, and left hip pain  PHYSICAL EXAM: BP 150/90  Pulse 72  Ht 4\' 9"  (1.448 m)  Wt 153 lb (69.4 kg)  BMI 33.11 kg/m2  General Appearance:    Alert, cooperative, no distress, appears stated age  Head:    Normocephalic, without obvious abnormality, atraumatic  Eyes:    PERRL, conjunctiva/corneas clear, EOM's intact, fundi    benign  Ears:    Normal TM's and  external ear canals. Blue PE tube R ear  Nose:   Nasal mucosa mildly edematous, sinuses mildly tender over frontal  Throat:   Lips, mucosa, and tongue normal; teeth and gums normal  Neck:   Supple, no lymphadenopathy;  thyroid:  no   enlargement/tenderness/nodules; no carotid   bruit or JVD  Back:    Spine nontender, no curvature, ROM normal, no CVA     tenderness  Lungs:     Clear to auscultation bilaterally without wheezes, rales or     ronchi; respirations unlabored  Chest Wall:    No tenderness or deformity   Heart:    Regular rate and rhythm, S1 and S2 normal, no murmur, rub   or gallop  Breast Exam:    R breast--s/p surgery with scarring and some skin changes related to previous radiation.  Area of concern is inferomedial breast.  I feel some fibroglandular changes along entire inferior R breast, with no focal mass. No tenderness, nipple discharge or inversion.      No axillary lymphadenopathy  Abdomen:     Soft, nondistended, normoactive bowel sounds,    no masses, no hepatosplenomegaly.  Very mild epigastric tenderness  Genitalia:    Normal external genitalia without lesions.  BUS and vagina normal;  Bimanual exam revealed surgically absent uterus. No adnexal masses. Mild tenderness at R adnexa/RLQ.  She has large overlying ecchymosis on skin in this area due to injury from dog.  No mass, rebound  tenderness or guarding  Rectal:    Normal tone, no masses or tenderness; guaiac negative stool  Extremities:   No clubbing, cyanosis or edema.  She is nontender over tibia and anterior tibialis muscles  Pulses:   2+ and symmetric all extremities  Skin:   Skin color, texture, turgor normal, no rashes or lesions  Lymph nodes:   Cervical, supraclavicular, and axillary nodes normal  Neurologic:   CNII-XII intact, normal strength, sensation and gait; reflexes 2+ and symmetric throughout          Psych:   Normal mood, affect, hygiene and grooming.    1. Routine general medical examination at a health care facility  POCT urinalysis dipstick, Visual acuity screening, CBC with Differential  2. Pure hypercholesterolemia  simvastatin (ZOCOR) 20 MG tablet, Lipid panel  3. Insomnia  zolpidem (AMBIEN CR) 12.5 MG CR tablet   controlled by Ambien CR  4. Depressive disorder, not elsewhere classified  PARoxetine (PAXIL) 40 MG tablet  5. Type II or unspecified type diabetes mellitus without mention of complication, not stated as uncontrolled  Comprehensive metabolic panel, Microalbumin / creatinine urine ratio   off medications/diet-controlled s/p gastric bypass surgery  6. Unspecified vitamin D deficiency  Vitamin D 25 hydroxy  7. Memory loss  Vitamin B12   mild; she relates has had some slight problems since chemo.  Check B12 level today  8. Need for Tdap vaccination  Tdap vaccine greater than or equal to 7yo IM  9. Need for pneumococcal vaccination  Pneumococcal polysaccharide vaccine 23-valent greater than or equal to 2yo subcutaneous/IM  10. Osteopenia  DG Bone Density   due for repeat DEXA (previously ordered by Dr. Donnie Coffin; will need records for comparison)  11. History of breast cancer     R breast.  Past due for f/u with Dr. Donnie Coffin and mammogram  12. Family history of colon cancer  Ambulatory referral to Gastroenterology  Shin splints--will keep better track to see if related to a particular shoe that  she is  wearing.  Taught stretches, recommend heat, NSAID prn (if able to tolerate--take with food, and avoid aspirin).  F/u with Dr. Charlsie Merles if ongoing problems  Colonoscopy referral to Dr. Virginia Rochester DEXA referral to breast center Mammogram to be scheduled, and patient to schedule follow up appt with Dr. Cornelius Moras be related to allergies.  Use OTC antihistamines, mucinex, +- sinus rinses.  Meclizine prn  Discussed monthly self breast exams and yearly mammograms; at least 30 minutes of aerobic activity at least 5 days/week; proper sunscreen use reviewed; healthy diet, including goals of calcium and vitamin D intake and alcohol recommendations (less than or equal to 1 drink/day) reviewed; regular seatbelt use; changing batteries in smoke detectors.  Immunization recommendations discussed--Tdap and pneumovax given today.  Yearly flu shots.  Colonoscopy recommendations reviewed--due now and referral done.  Hemoccult kit given

## 2010-11-16 ENCOUNTER — Other Ambulatory Visit: Payer: Self-pay | Admitting: Family Medicine

## 2010-11-16 ENCOUNTER — Encounter: Payer: Self-pay | Admitting: Gastroenterology

## 2010-11-16 ENCOUNTER — Encounter: Payer: Self-pay | Admitting: Family Medicine

## 2010-11-16 DIAGNOSIS — Z1231 Encounter for screening mammogram for malignant neoplasm of breast: Secondary | ICD-10-CM

## 2010-11-16 LAB — MICROALBUMIN / CREATININE URINE RATIO: Microalb, Ur: 0.89 mg/dL (ref 0.00–1.89)

## 2010-11-24 ENCOUNTER — Ambulatory Visit
Admission: RE | Admit: 2010-11-24 | Discharge: 2010-11-24 | Disposition: A | Discharge status: Home / Self Care | Payer: 59 | Source: Ambulatory Visit | Attending: Family Medicine | Admitting: Family Medicine

## 2010-11-24 DIAGNOSIS — Z1231 Encounter for screening mammogram for malignant neoplasm of breast: Secondary | ICD-10-CM

## 2010-11-24 DIAGNOSIS — M8580 Other specified disorders of bone density and structure, unspecified site: Secondary | ICD-10-CM

## 2010-11-29 ENCOUNTER — Telehealth: Payer: Self-pay | Admitting: *Deleted

## 2010-11-29 NOTE — Telephone Encounter (Signed)
Message copied by Melonie Florida on Wed Nov 29, 2010 10:47 AM ------      Message from: Lynelle Doctor, EVE      Created: Fri Nov 24, 2010  4:46 PM       Advise pt that bone density test shows osteoporosis.  There has been a significant decline in bone density since the comparison that was available for 05/2001.  She has had other DEXA scans in the interim through Dr. Renelda Loma office that we don't have--that is what they need to be compared to.  Please send copy of DEXA results to Dr. Donnie Coffin, and make sure that patient follows up with him (she is past due). Also please send copy of mammogram to Dr. Donnie Coffin

## 2010-11-29 NOTE — Telephone Encounter (Signed)
Spoke with patient, gave her bone density results. Faxed bone density and mammogram to Dr.Rubin, patient she will call and get appt ASAP with Dr.Rubin as she is overdue.

## 2010-12-18 ENCOUNTER — Ambulatory Visit (AMBULATORY_SURGERY_CENTER): Payer: 59 | Admitting: *Deleted

## 2010-12-18 VITALS — Ht <= 58 in | Wt 155.0 lb

## 2010-12-18 DIAGNOSIS — Z1211 Encounter for screening for malignant neoplasm of colon: Secondary | ICD-10-CM

## 2010-12-18 MED ORDER — PEG-KCL-NACL-NASULF-NA ASC-C 100 G PO SOLR: ORAL | Status: DC

## 2010-12-19 ENCOUNTER — Encounter: Payer: Self-pay | Admitting: Gastroenterology

## 2010-12-29 ENCOUNTER — Encounter: Payer: Self-pay | Admitting: Gastroenterology

## 2010-12-29 ENCOUNTER — Ambulatory Visit (AMBULATORY_SURGERY_CENTER): Payer: 59 | Admitting: Gastroenterology

## 2010-12-29 VITALS — BP 148/71 | HR 60 | Temp 98.0°F | Resp 25 | Ht <= 58 in | Wt 150.0 lb

## 2010-12-29 DIAGNOSIS — Z800 Family history of malignant neoplasm of digestive organs: Secondary | ICD-10-CM

## 2010-12-29 DIAGNOSIS — Z1211 Encounter for screening for malignant neoplasm of colon: Secondary | ICD-10-CM

## 2010-12-29 MED ORDER — SODIUM CHLORIDE 0.9 % IV SOLN: 500.0000 mL | INTRAVENOUS | Status: DC

## 2010-12-29 NOTE — Progress Notes (Signed)
At end of procedure pt with eyes open, no complaints of pain or discomfort. Watching monitor. EWM  Watched monitor and would close and open eyes. Asked if ok, any pain, pt said was fine. EWM  Pt tolerated procedure well. Denies pain, discomfort afterwards. EWM

## 2010-12-29 NOTE — Patient Instructions (Signed)
Discharge instructions given with verbal understanding.  Normal exam.  Resume previous medications. 

## 2011-01-01 ENCOUNTER — Telehealth: Payer: Self-pay | Admitting: *Deleted

## 2011-01-01 NOTE — Telephone Encounter (Signed)

## 2011-01-20 LAB — HM MAMMOGRAPHY: HM Mammogram: NORMAL

## 2011-01-30 HISTORY — PX: OTHER SURGICAL HISTORY: SHX169

## 2011-01-31 ENCOUNTER — Other Ambulatory Visit: Payer: Self-pay | Admitting: Oncology

## 2011-01-31 ENCOUNTER — Encounter (HOSPITAL_BASED_OUTPATIENT_CLINIC_OR_DEPARTMENT_OTHER): Payer: 59 | Admitting: Oncology

## 2011-01-31 ENCOUNTER — Other Ambulatory Visit: Payer: Self-pay | Admitting: Family Medicine

## 2011-01-31 DIAGNOSIS — C50919 Malignant neoplasm of unspecified site of unspecified female breast: Secondary | ICD-10-CM

## 2011-01-31 DIAGNOSIS — Z1231 Encounter for screening mammogram for malignant neoplasm of breast: Secondary | ICD-10-CM

## 2011-01-31 LAB — CBC WITH DIFFERENTIAL/PLATELET
BASO%: 0.2 % (ref 0.0–2.0)
EOS%: 1.1 % (ref 0.0–7.0)
HCT: 40.3 % (ref 34.8–46.6)
LYMPH%: 26 % (ref 14.0–49.7)
MCH: 29.3 pg (ref 25.1–34.0)
MCHC: 33.8 g/dL (ref 31.5–36.0)
MONO#: 1.3 10*3/uL — ABNORMAL HIGH (ref 0.1–0.9)
NEUT%: 62.8 % (ref 38.4–76.8)
Platelets: 282 10*3/uL (ref 145–400)
RBC: 4.66 10*6/uL (ref 3.70–5.45)
WBC: 13.6 10*3/uL — ABNORMAL HIGH (ref 3.9–10.3)
lymph#: 3.5 10*3/uL — ABNORMAL HIGH (ref 0.9–3.3)

## 2011-01-31 LAB — COMPREHENSIVE METABOLIC PANEL
ALT: 24 U/L (ref 0–35)
AST: 23 U/L (ref 0–37)
CO2: 29 mEq/L (ref 19–32)
Creatinine, Ser: 0.49 mg/dL — ABNORMAL LOW (ref 0.50–1.10)
Sodium: 139 mEq/L (ref 135–145)
Total Bilirubin: 0.2 mg/dL — ABNORMAL LOW (ref 0.3–1.2)
Total Protein: 7.3 g/dL (ref 6.0–8.3)

## 2011-02-08 ENCOUNTER — Encounter: Payer: Self-pay | Admitting: Medical

## 2011-02-28 ENCOUNTER — Ambulatory Visit (INDEPENDENT_AMBULATORY_CARE_PROVIDER_SITE_OTHER): Payer: 59 | Admitting: Family Medicine

## 2011-02-28 ENCOUNTER — Encounter: Payer: Self-pay | Admitting: Family Medicine

## 2011-02-28 VITALS — BP 130/84 | HR 80 | Temp 98.0°F | Ht <= 58 in | Wt 152.0 lb

## 2011-02-28 DIAGNOSIS — B349 Viral infection, unspecified: Secondary | ICD-10-CM

## 2011-02-28 DIAGNOSIS — B9789 Other viral agents as the cause of diseases classified elsewhere: Secondary | ICD-10-CM

## 2011-02-28 DIAGNOSIS — J309 Allergic rhinitis, unspecified: Secondary | ICD-10-CM

## 2011-02-28 NOTE — Progress Notes (Signed)
Chief Complaint:  sore/scratchy throat and sneezing since last Thursday, ears also feeling plugged  HPI: 6 days of scratchy throat, some chest congestion ("feels like there is a balloon in chest when I breathe"), and ears feel plugged.  Denies ear pain, drainage, some decreased hearing. Some ringing in the right ear. Right ear is a little worse than the left ear.  +sneezing.  Denies congestion or runny nose.  Sore throat, hurts to swallow.  Denies cough.  Denies sick contacts.  She has a history of fall allergies, but this feels a little different.  Has been using Mucinex with no significant improvement.   Had some diarrhea over the weekend.  Denies nausea, vomiting.  Some decreased appetite.  No skin rash.  Had some subjective feeling of fever and chills (didn't take temperature) 3 days ago, resolved.  Past Medical History  Diagnosis Date  . Breast cancer R breast(lumpectomy,chemo,radiation,tamoxifen) Dr.Rubin  . Hypertension resolved after weight loss surgery  . Diabetes mellitus resolved after weightr loss surgery  . Microalbuminuria h/o  . Iron deficiency h/o  . Depression   . Anxiety   . Sleep apnea   . Osteopenia (monitored by Dr Donnie Coffin)  . Plantar fasciitis (07') DrRegal    s/p surgical release 01/2011  . Vitamin D deficiency   . OA (osteoarthritis) of knee   . Insomnia chronic  . Allergy     fall seasonal  . Blood transfusion     Platlet transfusion when on heparin  . Hyperlipidemia     Past Surgical History  Procedure Date  . Breast lumpectomy right breast 11/1999  . Abdominal hysterectomy and RSO(endometriosis) 2000  . Appendectomy   . Cuboid stress fracture right 12/2002  . Left shoulder fracture repair (DrMortensen) 12/05  . 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 Myringotomy tubes B/L, multiple sets  . Foot surgery left 2008  . Colonoscopy   . Tonsillectomy and adenoidectomy   . Plantar fasciitis release 01/30/11    R foot, Dr. Charlsie Merles    History   Social History    . Marital Status: Married    Spouse Name: N/A    Number of Children: N/A  . Years of Education: N/A   Occupational History  . Not on file.   Social History Main Topics  . Smoking status: Former Smoker    Quit date: 01/28/1996  . Smokeless tobacco: Never Used  . Alcohol Use: Yes     glass of wine or beer maybe once a month.  . Drug Use: No  . Sexually Active: Not on file   Other Topics Concern  . Not on file   Social History Narrative  . No narrative on file    Family History  Problem Relation Age of Onset  . Dementia Mother   . Stroke Mother 42    due to aneurysm  . Macular degeneration Mother   . Cancer Mother     colon cancer  . Colon cancer Mother   . Pancreatic cancer Father   . Hyperlipidemia Sister   . Hyperlipidemia Brother   . Diabetes Maternal Grandmother   . Hyperlipidemia Sister   . Breast cancer Cousin   . Breast cancer Cousin     Current outpatient prescriptions:alendronate (FOSAMAX) 70 MG tablet, Take 70 mg by mouth every 7 (seven) days. Take with a full glass of water on an empty stomach. , Disp: , Rfl: ;  Calcium Carbonate-Vitamin D (CALTRATE 600+D) 600-400 MG-UNIT per tablet, Take 1 tablet by mouth 2 (  two) times daily. , Disp: , Rfl: ;  Cholecalciferol (VITAMIN D) 400 UNITS capsule, Take 400 Units by mouth 2 (two) times daily.  , Disp: , Rfl:  CycloSPORINE (RESTASIS OP), Apply 1 drop to eye 2 (two) times daily.  , Disp: , Rfl: ;  Lysine 1000 MG TABS, Take 1 tablet by mouth daily as needed. Cold sores, Disp: , Rfl: ;  magnesium 30 MG tablet, Take 30 mg by mouth daily.  , Disp: , Rfl: ;  Multiple Vitamins-Minerals (MULTIVITAMIN WITH MINERALS) tablet, Take 1 tablet by mouth daily.  , Disp: , Rfl:  Omega-3 Fatty Acids (FISH OIL) 1200 MG CAPS, Take 1 capsule by mouth daily. 2 nightly , Disp: , Rfl: ;  PARoxetine (PAXIL) 40 MG tablet, Take 1 tablet (40 mg total) by mouth every morning., Disp: 90 tablet, Rfl: 3;  simethicone (MYLICON) 80 MG chewable tablet,  Chew 80 mg by mouth as needed.  , Disp: , Rfl: ;  simvastatin (ZOCOR) 20 MG tablet, Take 1 tablet (20 mg total) by mouth at bedtime., Disp: 90 tablet, Rfl: 3 vitamin C (ASCORBIC ACID) 500 MG tablet, Take 500 mg by mouth daily.  , Disp: , Rfl: ;  vitamin E 400 UNIT capsule, Take 400 Units by mouth daily.  , Disp: , Rfl: ;  zolpidem (AMBIEN CR) 12.5 MG CR tablet, Take 1 tablet (12.5 mg total) by mouth at bedtime as needed., Disp: 90 tablet, Rfl: 1;  letrozole (FEMARA) 2.5 MG tablet, Take 2.5 mg by mouth daily.  , Disp: , Rfl:   Allergies  Allergen Reactions  . Heparin Other (See Comments)    Low platelets  . Niacin And Related Other (See Comments)    Blotchy/itchy  . Penicillins Rash   ROS:  See HPI  PHYSICAL EXAM: BP 130/84  Pulse 80  Temp(Src) 98 F (36.7 C) (Oral)  Ht 4\' 9"  (1.448 m)  Wt 152 lb (68.947 kg)  BMI 32.89 kg/m2 Well developed, pleasant female, in no distress.  No cough or congestion noted. HEENT: PERRL, EOMI, conjunctiva clear.  R ear--blue tube within wax within canal (cannot tell if still partly in TM).  L TM and EAC normal, with normal light reflex.  Nasal mucosa mildly edematous, clear mucus.  Sinuses nontender.  OP clear without erythema Neck: no lymphadenopathy or mass Heart: regular rate and rhythm without murmur Lungs: clear Abdomen: soft, nontender Skin: no rash Extremities: no edema.  Some soft tissue swelling R foot from recent PF release surgery  ASSESSMENT/PLAN:  1. Allergic rhinitis, cause unspecified   2. Viral syndrome    Suspect concomitant seasonal allergies, with possible virus (fevers and diarrhea).  Treat supportively with antihistamines (Claritin or Zyrtec), with occasional decongestant if needed (especially for ear discomfort).  Continue Mucinex for cough and chest congestion, if needed.  F/U if develops fever, worsening symptoms

## 2011-02-28 NOTE — Patient Instructions (Signed)
Suspect concomitant seasonal allergies, with possible virus (fevers and diarrhea).  Treat supportively with antihistamines (Claritin or Zyrtec), with occasional decongestant if needed (especially for ear discomfort).  Continue Mucinex for cough and chest congestion, if needed.  F/U if develops fever, worsening symptoms

## 2011-03-05 LAB — CREATININE, SERUM: GFR calc Af Amer: >60

## 2011-04-19 ENCOUNTER — Encounter: Payer: Self-pay | Admitting: Family Medicine

## 2011-04-19 ENCOUNTER — Ambulatory Visit (INDEPENDENT_AMBULATORY_CARE_PROVIDER_SITE_OTHER): Payer: 59 | Admitting: Family Medicine

## 2011-04-19 VITALS — BP 128/70 | HR 72 | Temp 98.3°F | Ht <= 58 in | Wt 152.0 lb

## 2011-04-19 DIAGNOSIS — M62838 Other muscle spasm: Secondary | ICD-10-CM

## 2011-04-19 DIAGNOSIS — J069 Acute upper respiratory infection, unspecified: Secondary | ICD-10-CM

## 2011-04-19 DIAGNOSIS — M25519 Pain in unspecified shoulder: Secondary | ICD-10-CM

## 2011-04-19 DIAGNOSIS — Z23 Encounter for immunization: Secondary | ICD-10-CM

## 2011-04-19 DIAGNOSIS — M25511 Pain in right shoulder: Secondary | ICD-10-CM

## 2011-04-19 MED ORDER — KETOROLAC TROMETHAMINE 60 MG/2ML IM SOLN
60.0000 mg | Freq: Once | INTRAMUSCULAR | Status: AC
  Administered 2011-04-19: 60 mg via INTRAMUSCULAR

## 2011-04-19 MED ORDER — CYCLOBENZAPRINE HCL 10 MG PO TABS: ORAL_TABLET | ORAL | Status: DC

## 2011-04-19 NOTE — Patient Instructions (Signed)
Heat, massage and stretches to neck and shoulder. F/u next week if not improving.  F/u immediately if high fever, shortness of breath, worsening symptoms Consider x-ray if cough isn't improving, or ongoing pain.  Mucinex DM for cough, avoid Nyquil since you will be taking Flexeril which is sedating. You may continue to use Vicodin if needed for severe pain You were given a Toradol shot, which is an anti-inflammatory.  Wait at least 6 hours before using other anti-inflammatories.  I would avoid aspirin products.  You may use aleve twice a day, taken with food.  Do not use Aleve long-term (due to your stomach surgery), but probably okay for short term, if taken with food and no stomach pain.

## 2011-04-19 NOTE — Progress Notes (Signed)
Chief complaint:  Cough since Sunday, chest tightness. Mucous yellowish-green. Right shoulder pain that radiates into her neck-painful to touch  HPI:  Started with pain in her chest, which radiated to her R shoulder last week.  Then she started with a cold, and developed a cough.  The cough seems to have worsened the shoulder pain, which radiates to the back of her arm to her R elbow, and up into her neck.  Some tingling down the arm and into the 3rd and 4th fingers.  Pain with moving her hand/fingers.  Denies weakness.  Has been using Vicodin, with some help, reducing pain from 10 to 7/10.  Has been using a heating back, without help.  Feels better if she has her R arm over her head, pain is worse when her arm is down.  [employer] hasn't helped much either.  Having chest congestion. Had a fever (tactile) for a few days, which has resolved.  Cough is not productive.  Cough was worse earlier in the week, but seems to be less coughing at night the last few nights.  Using Nyquil.  Denies sinus pressure, runny nose, head congestion.  Some shortness of breath, but also pain with breathing, in the shoulder and chest area.  Past Medical History  Diagnosis Date  . Breast cancer R breast(lumpectomy,chemo,radiation,tamoxifen) Dr.Rubin  . Hypertension resolved after weight loss surgery  . Diabetes mellitus resolved after weightr loss surgery  . Microalbuminuria h/o  . Iron deficiency h/o  . Depression   . Anxiety   . Sleep apnea   . Osteopenia (monitored by Dr Donnie Coffin)  . Plantar fasciitis (07') DrRegal    s/p surgical release 01/2011  . Vitamin D deficiency   . OA (osteoarthritis) of knee   . Insomnia chronic  . Allergy     fall seasonal  . Blood transfusion     Platlet transfusion when on heparin  . Hyperlipidemia     Past Surgical History  Procedure Date  . Breast lumpectomy right breast 11/1999  . Abdominal hysterectomy and RSO(endometriosis) 2000  . Appendectomy   . Cuboid stress  fracture right 12/2002  . Left shoulder fracture repair (DrMortensen) 12/05  . 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 Myringotomy tubes B/L, multiple sets  . Foot surgery left 2008  . Colonoscopy   . Tonsillectomy and adenoidectomy   . Plantar fasciitis release 01/30/11    R foot, Dr. Charlsie Merles    History   Social History  . Marital Status: Married    Spouse Name: N/A    Number of Children: N/A  . Years of Education: N/A   Occupational History  . Not on file.   Social History Main Topics  . Smoking status: Former Smoker    Quit date: 01/28/1996  . Smokeless tobacco: Never Used  . Alcohol Use: Yes     glass of wine or beer maybe once a month.  . Drug Use: No  . Sexually Active: Not on file   Other Topics Concern  . Not on file   Social History Narrative  . No narrative on file    Family History  Problem Relation Age of Onset  . Dementia Mother   . Stroke Mother 2    due to aneurysm  . Macular degeneration Mother   . Cancer Mother     colon cancer  . Colon cancer Mother   . Pancreatic cancer Father   . Hyperlipidemia Sister   . Hyperlipidemia Brother   .  Diabetes Maternal Grandmother   . Hyperlipidemia Sister   . Breast cancer Cousin   . Breast cancer Cousin    Current Outpatient Prescriptions on File Prior to Visit  Medication Sig Dispense Refill  . alendronate (FOSAMAX) 70 MG tablet Take 70 mg by mouth every 7 (seven) days. Take with a full glass of water on an empty stomach.       . Calcium Carbonate-Vitamin D (CALTRATE 600+D) 600-400 MG-UNIT per tablet Take 1 tablet by mouth 2 (two) times daily.       Marland Kitchen Lysine 1000 MG TABS Take 1 tablet by mouth daily as needed. Cold sores      . Multiple Vitamins-Minerals (MULTIVITAMIN WITH MINERALS) tablet Take 1 tablet by mouth daily.        . Omega-3 Fatty Acids (FISH OIL) 1200 MG CAPS Take 1 capsule by mouth daily. 2 nightly       . PARoxetine (PAXIL) 40 MG tablet Take 1 tablet (40 mg total) by mouth every morning.  90  tablet  3  . simethicone (MYLICON) 80 MG chewable tablet Chew 80 mg by mouth as needed.        . simvastatin (ZOCOR) 20 MG tablet Take 1 tablet (20 mg total) by mouth at bedtime.  90 tablet  3  . vitamin C (ASCORBIC ACID) 500 MG tablet Take 500 mg by mouth daily.        . vitamin E 400 UNIT capsule Take 400 Units by mouth daily.        Marland Kitchen zolpidem (AMBIEN CR) 12.5 MG CR tablet Take 1 tablet (12.5 mg total) by mouth at bedtime as needed.  90 tablet  1   No current facility-administered medications on file prior to visit.   Allergies  Allergen Reactions  . Heparin Other (See Comments)    Low platelets  . Niacin And Related Other (See Comments)    Blotchy/itchy  . Penicillins Rash   ROS:  See HPI.  No nausea, vomiting, diarrhea, skin rash. See HPI.  +chronic insomnia, treated with ambien  PHYSICAL EXAM: BP 128/70  Pulse 72  Temp(Src) 98.3 F (36.8 C) (Oral)  Ht 4\' 9"  (1.448 m)  Wt 152 lb (68.947 kg)  BMI 32.89 kg/m2 Well developed female, appearing in moderate discomfort, holding her right arm against her body HEENT:  PERRL, EOMI, conjunctiva clear.  TM's and EAC's normal.  OP clear.  Nasal mucosa normal without purulence--some thick white mucus noted in L nares.  Sinuses nontender Heart: regular rate and rhythm without murmur Lungs: clear bilaterally.  No wheezes, rales or ronchi Neck: spine nontender.  +tender and spasm of R paraspinous, trapezius and rhomboid muscles on R.  FROM of Right shoulder.  Normal strength, sensation, rotator cuff testing intact. Skin: no rash  ASSESSMENT/PLAN: 1. Shoulder pain, right  ketorolac (TORADOL) injection 60 mg  2. Muscle spasm  cyclobenzaprine (FLEXERIL) 10 MG tablet  3. URI (upper respiratory infection)    4. Need for prophylactic vaccination and inoculation against influenza  Flu vaccine greater than or equal to 3yo preservative free IM   toradol 60mg  given.  May use OTC Aleve sparingly (due to her history of bariatric surgery), not to  start until 6 hours after toradol injection.  May also use tylenol or vicodin if needed. rx flexeril 10 mg 1/2-1 tab every 8 hrs prn muscle spasm #20--counseled regarding sedating side effects Heat, massage and stretches recommended  F/u next week if not improving.  F/u immediately if high fever,  shortness of breath, worsening symptoms Consider x-ray if cough isn't improving, or ongoing pain.   Mucinex DM for cough, avoid Nyquil since you will be taking Flexeril which is sedating. You may continue to use Vicodin if needed for severe pain You were given a Toradol shot, which is an anti-inflammatory.  Wait at least 6 hours before using other anti-inflammatories.  I would avoid aspirin products.  You may use aleve twice a day, taken with food.  Do not use Aleve long-term (due to your stomach surgery), but probably okay for short term, if taken with food and no stomach pain.

## 2011-04-23 ENCOUNTER — Telehealth: Payer: Self-pay | Admitting: Family Medicine

## 2011-04-24 ENCOUNTER — Encounter: Payer: Self-pay | Admitting: Internal Medicine

## 2011-04-24 NOTE — Telephone Encounter (Signed)
Noted.  Was advised to follow up this week if ongoing pain--appt Thurs

## 2011-04-26 ENCOUNTER — Ambulatory Visit (INDEPENDENT_AMBULATORY_CARE_PROVIDER_SITE_OTHER): Payer: 59 | Admitting: Family Medicine

## 2011-04-26 DIAGNOSIS — M25519 Pain in unspecified shoulder: Secondary | ICD-10-CM

## 2011-04-26 DIAGNOSIS — M25511 Pain in right shoulder: Secondary | ICD-10-CM

## 2011-04-26 DIAGNOSIS — M62838 Other muscle spasm: Secondary | ICD-10-CM

## 2011-04-26 MED ORDER — CYCLOBENZAPRINE HCL 10 MG PO TABS: ORAL_TABLET | ORAL | Status: DC

## 2011-04-26 MED ORDER — METHYLPREDNISOLONE 4 MG PO KIT: PACK | ORAL | Status: AC

## 2011-04-26 MED ORDER — HYDROCODONE-ACETAMINOPHEN 5-325 MG PO TABS: ORAL_TABLET | ORAL | Status: DC

## 2011-04-26 NOTE — Patient Instructions (Signed)
Referring to physical therapy.  Continue heat, massage, muscle relaxants.  STOP the Aleve if/when you start the oral steroids.  If your PT is scheduled for the next few days, you might want to give that a try before starting the steroids.  Start the steroids if appointment is far off, or if ongoing pain despite therapy

## 2011-04-26 NOTE — Progress Notes (Signed)
Patient presents with ongoing R shoulder pain.  URI symptoms have resolved. Taking Aleve twice a day, heat, Vicodin (leftover from foot surgery) and Flexeril--nothing seems to help decrease the pain in the right shoulder, which radiates down the right arm.  Denies numbness/tingling into arm or hand.  Pain doesn't radiate below the elbow.  Has pain at rest, worse with use of her right hand (brushing teeth, lifting things, any movement at all).  Pain is diminished some only if she takes the Flexeril, Aleve and Vicodin all together--pain 10/10 before meds, down to 7/10 after medications.  Sleep doesn't seem to be disturbed due to pain, but she takes Ambien at bedtime.  Doesn't awaken due to discomfort or pain  Past Medical History  Diagnosis Date  . Breast cancer R breast(lumpectomy,chemo,radiation,tamoxifen) Dr.Rubin  . Hypertension resolved after weight loss surgery  . Diabetes mellitus resolved after weightr loss surgery  . Microalbuminuria h/o  . Iron deficiency h/o  . Depression   . Anxiety   . Sleep apnea   . Osteopenia (monitored by Dr Donnie Coffin)  . Plantar fasciitis (07') DrRegal    s/p surgical release 01/2011  . Vitamin D deficiency   . OA (osteoarthritis) of knee   . Insomnia chronic  . Allergy     fall seasonal  . Blood transfusion     Platlet transfusion when on heparin  . Hyperlipidemia     Past Surgical History  Procedure Date  . Breast lumpectomy right breast 11/1999  . Abdominal hysterectomy and RSO(endometriosis) 2000  . Appendectomy   . Cuboid stress fracture right 12/2002  . Left shoulder fracture repair (DrMortensen) 12/05  . 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 Myringotomy tubes B/L, multiple sets  . Foot surgery left 2008  . Colonoscopy   . Tonsillectomy and adenoidectomy   . Plantar fasciitis release 01/30/11    R foot, Dr. Charlsie Merles    History   Social History  . Marital Status: Married    Spouse Name: N/A    Number of Children: N/A  . Years of Education: N/A     Occupational History  . Not on file.   Social History Main Topics  . Smoking status: Former Smoker    Quit date: 01/28/1996  . Smokeless tobacco: Never Used  . Alcohol Use: Yes     glass of wine or beer maybe once a month.  . Drug Use: No  . Sexually Active: Not on file   Other Topics Concern  . Not on file   Social History Narrative  . No narrative on file    Family History  Problem Relation Age of Onset  . Dementia Mother   . Stroke Mother 28    due to aneurysm  . Macular degeneration Mother   . Cancer Mother     colon cancer  . Colon cancer Mother   . Pancreatic cancer Father   . Cancer Father   . Hyperlipidemia Sister   . Hyperlipidemia Brother   . Diabetes Maternal Grandmother   . Hyperlipidemia Sister   . Breast cancer Cousin   . Breast cancer Cousin     Current outpatient prescriptions:alendronate (FOSAMAX) 70 MG tablet, Take 70 mg by mouth every 7 (seven) days. Take with a full glass of water on an empty stomach. , Disp: , Rfl: ;  Calcium Carbonate-Vitamin D (CALTRATE 600+D) 600-400 MG-UNIT per tablet, Take 1 tablet by mouth 2 (two) times daily. , Disp: , Rfl: ;  cholecalciferol (VITAMIN D) 1000 UNITS tablet,  Take 1,000 Units by mouth daily.  , Disp: , Rfl:  cyclobenzaprine (FLEXERIL) 10 MG tablet, 1/2 to 1 tablet every 8 hours as needed for muscle spasm, Disp: 40 tablet, Rfl: 0;  HYDROcodone-acetaminophen (NORCO) 5-325 MG per tablet, 1 tablet by mouth every 4-6 hours as needed for severe pain, Disp: 30 tablet, Rfl: 0;  Lysine 1000 MG TABS, Take 1 tablet by mouth daily as needed. Cold sores, Disp: , Rfl:  Multiple Vitamins-Minerals (MULTIVITAMIN WITH MINERALS) tablet, Take 1 tablet by mouth daily.  , Disp: , Rfl: ;  Omega-3 Fatty Acids (FISH OIL) 1200 MG CAPS, Take 1 capsule by mouth daily. 2 nightly , Disp: , Rfl: ;  PARoxetine (PAXIL) 40 MG tablet, Take 1 tablet (40 mg total) by mouth every morning., Disp: 90 tablet, Rfl: 3;  simethicone (MYLICON) 80 MG chewable  tablet, Chew 80 mg by mouth as needed.  , Disp: , Rfl:  simvastatin (ZOCOR) 20 MG tablet, Take 1 tablet (20 mg total) by mouth at bedtime., Disp: 90 tablet, Rfl: 3;  vitamin C (ASCORBIC ACID) 500 MG tablet, Take 500 mg by mouth daily.  , Disp: , Rfl: ;  vitamin E 400 UNIT capsule, Take 400 Units by mouth daily.  , Disp: , Rfl: ;  zolpidem (AMBIEN CR) 12.5 MG CR tablet, Take 1 tablet (12.5 mg total) by mouth at bedtime as needed., Disp: 90 tablet, Rfl: 1 methylPREDNISolone (MEDROL, PAK,) 4 MG tablet, follow package directions, Disp: 21 tablet, Rfl: 0  Allergies  Allergen Reactions  . Heparin Other (See Comments)    Low platelets  . Niacin And Related Other (See Comments)    Blotchy/itchy  . Penicillins Rash   ROS:  Denies fevers, URI symptoms, shortness of breath, chest pain, rash, edema  PHYSICAL EXAM: BP 120/86  Pulse 80  Ht 4\' 9"  (1.448 m)  Wt 157 lb (71.215 kg)  BMI 33.97 kg/m2 Doesn't appear to be in as much discomfort as last week, not holding her arms in odd position like last week (was the only way to be comfortable, holding her arm over her head last week) Less tender over R paraspinous muscles, but still very tender over R trapezius.  Only mild spasm Strength, sensation and DTR's normal C-spine nontender  ASSESSMENT/PLAN: 1. Muscle spasm  cyclobenzaprine (FLEXERIL) 10 MG tablet  2. Shoulder pain, right  HYDROcodone-acetaminophen (NORCO) 5-325 MG per tablet, methylPREDNISolone (MEDROL, PAK,) 4 MG tablet, Ambulatory referral to Physical Therapy   R shoulder pain--consistent with muscle spasm, possibly radiculopathy.  Failed conservative measures with heat, massage, NSAIDs and muscle relaxant.  Refer for physical therapy.  Trial of oral steroids --risks/side effects reviewed with patient.  If PT appt is very soon, may opt to hold of on starting steroid course.  F/u 2-3 weeks if not better

## 2011-05-02 ENCOUNTER — Telehealth: Payer: Self-pay | Admitting: Family Medicine

## 2011-05-02 NOTE — Telephone Encounter (Signed)
DONE

## 2011-05-17 ENCOUNTER — Ambulatory Visit: Payer: 59 | Admitting: Family Medicine

## 2011-06-11 ENCOUNTER — Telehealth: Payer: Self-pay | Admitting: Family Medicine

## 2011-06-11 DIAGNOSIS — G4700 Insomnia, unspecified: Secondary | ICD-10-CM

## 2011-06-11 MED ORDER — ZOLPIDEM TARTRATE ER 12.5 MG PO TBCR: 12.5000 mg | EXTENDED_RELEASE_TABLET | Freq: Every evening | ORAL | Status: DC | PRN

## 2011-06-11 NOTE — Telephone Encounter (Signed)
Okay for rx's (#90 for mail order)

## 2011-06-11 NOTE — Telephone Encounter (Signed)
Left message to let patient know that Ambien 12.5mg  #15 was called into [facility] [address] and her 90 day supply was called into [facility] for #90 with 1 refill per Dr.Knapp.

## 2011-06-14 ENCOUNTER — Encounter: Payer: Self-pay | Admitting: Family Medicine

## 2011-06-14 ENCOUNTER — Ambulatory Visit (INDEPENDENT_AMBULATORY_CARE_PROVIDER_SITE_OTHER): Payer: 59 | Admitting: Family Medicine

## 2011-06-14 DIAGNOSIS — G4700 Insomnia, unspecified: Secondary | ICD-10-CM

## 2011-06-14 DIAGNOSIS — J069 Acute upper respiratory infection, unspecified: Secondary | ICD-10-CM

## 2011-06-14 DIAGNOSIS — E119 Type 2 diabetes mellitus without complications: Secondary | ICD-10-CM

## 2011-06-14 DIAGNOSIS — E7800 Pure hypercholesterolemia, unspecified: Secondary | ICD-10-CM

## 2011-06-14 LAB — HEMOGLOBIN A1C
Hgb A1c MFr Bld: 6.1 % — ABNORMAL HIGH (ref <5.7)
Mean Plasma Glucose: 128 mg/dL — ABNORMAL HIGH (ref <117)

## 2011-06-14 LAB — LIPID PANEL
HDL: 47 mg/dL (ref >39)
LDL Cholesterol: 106 mg/dL — ABNORMAL HIGH (ref 0–99)
Total CHOL/HDL Ratio: 3.9 Ratio
VLDL: 31 mg/dL (ref 0–40)

## 2011-06-14 LAB — HEPATIC FUNCTION PANEL
ALT: 36 U/L — ABNORMAL HIGH (ref 0–35)
Albumin: 4.7 g/dL (ref 3.5–5.2)
Total Protein: 6.8 g/dL (ref 6.0–8.3)

## 2011-06-14 NOTE — Progress Notes (Signed)
Chief complaint: 6 month fasting follow up , pt requests that A1C be done with labs today. Would like refill on hydrocodone  HPI: Complaining of ongoing foot pain.  Had surgery on R foot by Dr. Charlsie Merles 01/30/11, but having some ongoing pain. Has a few pills left, but hasn't been back for f/u.  Diabetes follow-up:  Blood sugars are not checked elsewhere.  She has diet controlled diabetes, and has been off medications since her gastric bypass surgery. Denies polydipsia and polyuria.  Last eye exam was 03/2011.  Patient follows a low sugar diet and checks feet regularly without concerns. Exercising 15 minutes daily, limited by her right foot pain  Hyperlipidemia follow-up:  Patient is reportedly following a low-fat, low cholesterol diet.  Compliant with medications and denies medication side effects  Shoulder pain completely resolved with muscle relaxants and steroids--never needed to get PT  Depression--Well controlled, denies side effects of medications Insomnia--well controlled with Ambien CR, needs it nightly.  Patient complains of nasal congestion and sinus headaches x 2 days.  Using some tylenol sinus medication with improvement  Past Medical History  Diagnosis Date  . Breast cancer R breast(lumpectomy,chemo,radiation,tamoxifen) Dr.Rubin  . Hypertension resolved after weight loss surgery  . Diabetes mellitus resolved after weightr loss surgery  . Microalbuminuria h/o  . Iron deficiency h/o  . Depression   . Anxiety   . Sleep apnea   . Osteopenia (monitored by Dr Donnie Coffin)  . Plantar fasciitis (07') DrRegal    s/p surgical release 01/2011  . Vitamin D deficiency   . OA (osteoarthritis) of knee   . Insomnia chronic  . Allergy     fall seasonal  . Blood transfusion     Platlet transfusion when on heparin  . Hyperlipidemia     Past Surgical History  Procedure Date  . Breast lumpectomy right breast 11/1999  . Abdominal hysterectomy and RSO(endometriosis) 2000  . Appendectomy   .  Cuboid stress fracture right 12/2002  . Left shoulder fracture repair (DrMortensen) 12/05  . 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 Myringotomy tubes B/L, multiple sets  . Foot surgery left 2008  . Colonoscopy   . Tonsillectomy and adenoidectomy   . Plantar fasciitis release 01/30/11    R foot, Dr. Charlsie Merles    History   Social History  . Marital Status: Married    Spouse Name: N/A    Number of Children: N/A  . Years of Education: N/A   Occupational History  . Not on file.   Social History Main Topics  . Smoking status: Former Smoker    Quit date: 01/28/1996  . Smokeless tobacco: Never Used  . Alcohol Use: Yes     glass of wine or beer maybe once a month.  . Drug Use: No  . Sexually Active: Not on file   Other Topics Concern  . Not on file   Social History Narrative  . No narrative on file    Family History  Problem Relation Age of Onset  . Dementia Mother   . Stroke Mother 43    due to aneurysm  . Macular degeneration Mother   . Cancer Mother     colon cancer  . Colon cancer Mother   . Pancreatic cancer Father   . Cancer Father   . Hyperlipidemia Sister   . Hyperlipidemia Brother   . Diabetes Maternal Grandmother   . Hyperlipidemia Sister   . Breast cancer Cousin   . Breast cancer Cousin    Current Outpatient  Prescriptions on File Prior to Visit  Medication Sig Dispense Refill  . alendronate (FOSAMAX) 70 MG tablet Take 70 mg by mouth every 7 (seven) days. Take with a full glass of water on an empty stomach.       . Calcium Carbonate-Vitamin D (CALTRATE 600+D) 600-400 MG-UNIT per tablet Take 1 tablet by mouth 2 (two) times daily.       . cholecalciferol (VITAMIN D) 1000 UNITS tablet Take 1,000 Units by mouth daily.        Marland Kitchen HYDROcodone-acetaminophen (NORCO) 5-325 MG per tablet 1 tablet by mouth every 4-6 hours as needed for severe pain  30 tablet  0  . Lysine 1000 MG TABS Take 1 tablet by mouth daily as needed. Cold sores      . Multiple Vitamins-Minerals  (MULTIVITAMIN WITH MINERALS) tablet Take 1 tablet by mouth daily.        . Omega-3 Fatty Acids (FISH OIL) 1200 MG CAPS Take 1 capsule by mouth daily. 2 nightly       . PARoxetine (PAXIL) 40 MG tablet Take 1 tablet (40 mg total) by mouth every morning.  90 tablet  3  . simethicone (MYLICON) 80 MG chewable tablet Chew 80 mg by mouth as needed.        . simvastatin (ZOCOR) 20 MG tablet Take 1 tablet (20 mg total) by mouth at bedtime.  90 tablet  3  . vitamin C (ASCORBIC ACID) 500 MG tablet Take 500 mg by mouth daily.        . vitamin E 400 UNIT capsule Take 400 Units by mouth daily.        Marland Kitchen zolpidem (AMBIEN CR) 12.5 MG CR tablet Take 1 tablet (12.5 mg total) by mouth at bedtime as needed.  15 tablet  0  . cyclobenzaprine (FLEXERIL) 10 MG tablet 1/2 to 1 tablet every 8 hours as needed for muscle spasm  40 tablet  0    Allergies  Allergen Reactions  . Heparin Other (See Comments)    Low platelets  . Niacin And Related Other (See Comments)    Blotchy/itchy  . Penicillins Rash   ROS:  Denies fevers, chest pain, shortness of breath, dizziness, skin rash, GI complaints or other concerns.  See HPI  PHYSICAL EXAM: BP 126/86  Pulse 68  Ht 4\' 9"  (1.448 m)  Wt 152 lb (68.947 kg)  BMI 32.89 kg/m2 Well developed, pleasant female, overweight, in no distress HEENT:  PERRL, EOMI, conjunctiva clear.  Nasal mucosa mildly edematous, with clear-white discharge, some recent bleeding on right. Sinuses mildly tender x 4.  OP clear.  TM's and EAC's normal. Neck: no lymphadenopathy, thyromegaly, carotid bruit or mass Heart: regular rate and rhythm without murmur Lungs: clear bilaterally Abdomen: soft, nontender, no organomegaly or mass Extremities: no edema, 2+ pulse Normal diabetic foot exam--see other section Skin: no rash Psych: normal mood, affect, hygiene and grooming  ASSESSMENT/PLAN: 1. Insomnia    2. Pure hypercholesterolemia  Lipid panel, Hepatic function panel  3. Type II or unspecified type  diabetes mellitus without mention of complication, not stated as uncontrolled  Hemoglobin A1c, Glucose, random  4. Acute upper respiratory infections of unspecified site      Obesity--further weight loss encouraged Increase exercise to minimum of 30 minutes daily. Continue to watch diet, and portion sizes  DM--diet controlled,  Check A1c today  Hyperlipidemia--labs due  URI--continue decongestants as needed for sinus pain.  Consider sinus rinses or Neti-pot, and use of Mucinex (or other  guaifenesin product) to help thin out the mucus. Contact us if symptoms persisting/worsening after 7-10 days of symptoms

## 2011-06-14 NOTE — Patient Instructions (Signed)
Increase exercise to minimum of 30 minutes daily. Continue to watch diet, and portion sizes  URI--continue decongestants as needed for sinus pain.  Consider sinus rinses or Neti-pot, and use of Mucinex (or other guaifenesin product) to help thin out the mucus. Contact us if symptoms persisting/worsening after 7-10 days of symptoms

## 2011-06-18 ENCOUNTER — Telehealth: Payer: Self-pay | Admitting: *Deleted

## 2011-06-18 ENCOUNTER — Other Ambulatory Visit: Payer: Self-pay | Admitting: *Deleted

## 2011-06-18 DIAGNOSIS — E7800 Pure hypercholesterolemia, unspecified: Secondary | ICD-10-CM

## 2011-06-18 DIAGNOSIS — E119 Type 2 diabetes mellitus without complications: Secondary | ICD-10-CM

## 2011-06-18 NOTE — Telephone Encounter (Signed)
Error

## 2011-07-04 ENCOUNTER — Ambulatory Visit (INDEPENDENT_AMBULATORY_CARE_PROVIDER_SITE_OTHER): Payer: 59 | Admitting: Medical

## 2011-07-04 ENCOUNTER — Encounter: Payer: Self-pay | Admitting: Medical

## 2011-07-04 VITALS — BP 128/80 | HR 68 | Temp 98.4°F | Resp 16 | Wt 152.0 lb

## 2011-07-04 DIAGNOSIS — J40 Bronchitis, not specified as acute or chronic: Secondary | ICD-10-CM

## 2011-07-04 MED ORDER — DOXYCYCLINE HYCLATE 100 MG PO TABS: 100.0000 mg | ORAL_TABLET | Freq: Two times a day (BID) | ORAL | Status: AC

## 2011-07-04 MED ORDER — METHYLPREDNISOLONE ACETATE 80 MG/ML IJ SUSP: 80.0000 mg | Freq: Once | INTRAMUSCULAR | Status: DC

## 2011-07-04 MED ORDER — METHYLPREDNISOLONE ACETATE PF 40 MG/ML IJ SUSP
80.0000 mg | Freq: Once | INTRAMUSCULAR | Status: AC
  Administered 2011-07-04: 80 mg via INTRAMUSCULAR

## 2011-07-04 MED ORDER — HYDROCODONE-HOMATROPINE 5-1.5 MG/5ML PO SYRP: 5.0000 mL | ORAL_SOLUTION | Freq: Four times a day (QID) | ORAL | Status: AC | PRN

## 2011-07-04 NOTE — Progress Notes (Signed)
Addended by: Janeice Robinson on: 07/04/2011 02:22 PM   Modules accepted: Orders

## 2011-07-04 NOTE — Progress Notes (Signed)
Subjective  Julie Fischer is a 51 y.o. female who presents for 1 week hx/o sore throat, runny nose, sneezing, nasal congestion as well as clear nasal discharge.   Started having cough, getting some phlegm.  Cough is keeping her up at night.  Having some ear fullness.  She notes some chest and back pain from all the coughing.   Having fever and chills intermittently.  Highest temp was 99.6.  +work sick contacts.  Using Vicks vapor rub, Nyquil.  No other aggravating or relieving factors.  No other c/o.  The following portions of the patient's history were reviewed and updated as appropriate: allergies, current medications, past family history, past medical history, past social history, past surgical history and problem list.  ROS: Gen.: No weight gain or weight change Skin: No rash GI: No nausea, vomiting, diarrhea Heart: No palpitations or edema Lungs: No shortness of breath or wheezing GU: Negative   Objective:   Filed Vitals:   07/04/11 1334  BP: 128/80  Pulse: 68  Temp: 98.4 F (36.9 C)  Resp: 16    General appearance: Alert, WD/WN, no distress, ill appearing, bad frequent coughing spells                              Skin: warm, no rash, no diaphoresis                           Head: no sinus tenderness                            Eyes: conjunctiva normal, corneas clear, PERRLA                            Ears: left TM with erythema, right TM with tube in place, external ear canals normal                          Nose: septum midline, turbinates swollen, with erythema and clear discharge             Mouth/throat: MMM, tongue normal, mild pharyngeal erythema                           Neck: supple, no adenopathy, no thyromegaly, nontender                          Heart: RRR, normal S1, S2, no murmurs                         Lungs: +bronchial breath sounds, few +scattered rhonchi, no wheezes, no rales                Extremities: no edema, nontender     Assessment and Plan:    Encounter Diagnosis  Name Primary?  . Bronchitis Yes   Prescription given today for Doxycycline as below. Hycodan prn for worse cough.  Depo Medrol 80mg  IM today to help with cough/inflammation.  Discussed diagnosis and treatment of bronchitis.  Suggested symptomatic OTC remedies for cough and congestion.  Call/return in 2-3 days if symptoms are worse or not improving.  Advised that cough may linger even after the infection is improved.

## 2011-07-04 NOTE — Patient Instructions (Signed)
Acute Bronchitis Bronchitis is a problem of the air tubes leading to your lungs. Acute means the illness started quickly. In this condition, the lining of those tubes becomes puffy (swollen) and can leak fluid. This makes it harder for air to get in and out of your lungs. You may cough a lot. This is because the air tubes are narrow. Bronchitis is most often caused by a virus. Medicines that kill germs (antibiotics) may be needed with germ (bacteria) infections for people who:  Smoke.   Have lasting (chronic) lung problems.   Are elderly.  HOME CARE  Rest.   Drink enough water and fluids to keep the pee clear or pale yellow.   Only take medicine as told by your doctor.   Medicines may be prescribed that will open up the airways. This will help make breathing easier.   Bronchitis usually gets better on its own in a few days.  Recovery from some problems (symptoms) of bronchitis may be slow. You should start feeling a little better after 2 to 3 days. Coughing may last for 3 to 4 weeks. GET HELP RIGHT AWAY IF:  You or your child has a temperature by mouth above 101, not controlled by medicine.   Chills or chest pain develops.   You or your child develops very bad shortness of breath.   There is bloody saliva mixed with mucus (sputum).   You or your child throws up (vomits) often, loses too much fluid (dehydration), feels faint, or has a very bad headache.   You or your child does not improve after 1 week of treatment.  MAKE SURE YOU:   Understand these instructions.   Will watch this condition.   Will get help right away if you or your child is not doing well or gets worse.  Document Released: 10/24/2007 Document Re-Released: 08/01/2009 [employer] Patient Information 2011 [employer].  

## 2011-07-06 ENCOUNTER — Ambulatory Visit (INDEPENDENT_AMBULATORY_CARE_PROVIDER_SITE_OTHER): Payer: 59 | Admitting: Medical

## 2011-07-06 ENCOUNTER — Encounter: Payer: Self-pay | Admitting: Medical

## 2011-07-06 VITALS — BP 130/80 | HR 60 | Temp 98.1°F | Resp 16 | Wt 152.0 lb

## 2011-07-06 DIAGNOSIS — J40 Bronchitis, not specified as acute or chronic: Secondary | ICD-10-CM

## 2011-07-06 NOTE — Progress Notes (Signed)
Subjective  Julie Fischer is a 51 y.o. female who presents for recheck.  I saw her a few days ago for horrible cough and chest congestion.  She is using the antibiotic, Hycodan cough syrup.  She says she feels still fatigued, no real improvement, and not drinking a lot of fluids.  No appetite.  Denies fever, no SOB or wheezing, no CP or palpitations, no edema, no nausea, vomiting, diarrhea, no recent long travel, no recent surgery or procedure, no hx/o DVT/PE.  No other aggravating or relieving factors.  No other c/o.  The following portions of the patient's history were reviewed and updated as appropriate: allergies, current medications, past family history, past medical history, past social history, past surgical history and problem list.  ROS: Gen.: No weight gain or weight change Skin: No rash GI: No nausea, vomiting, diarrhea Heart: No palpitations or edema Lungs: No shortness of breath or wheezing GU: Negative   Objective:   Filed Vitals:   07/06/11 1436  BP: 130/80  Pulse: 60  Temp: 98.1 F (36.7 C)  Resp: 16    General appearance: Alert, WD/WN, no distress, and she did not cough at all while I was in exam room                             Skin: warm, no rash, no diaphoresis                           Head: no sinus tenderness                            Eyes: conjunctiva normal, corneas clear, PERRLA                            Ears: TMs pearly, right with tube in place, external ear canals normal                          Nose: septum midline, turbinates swollen, with erythema and clear discharge             Mouth/throat: MMM, tongue normal, mild pharyngeal erythema                           Neck: supple, no adenopathy, no thyromegaly, nontender                          Heart: RRR, normal S1, S2, no murmurs                         Lungs: CTA, no rhonchi, no wheezes, no rales                Extremities: no edema, nontender     Assessment and Plan:   Encounter Diagnosis    Name Primary?  . Bronchitis Yes   Overall she appears improved from a few days ago. The other day she could barely speak because of the coughing, today I did not hear even a single cough.  We talked about hydration, trying to stay hydrated, advise she finish the antibiotic, can still use Hycodan syrup for cough, and if worse or not improving call or return.  I gave her my  cell phone number to call in the event things are worse over the weekend.

## 2011-08-23 ENCOUNTER — Telehealth: Payer: Self-pay | Admitting: Internal Medicine

## 2011-08-23 DIAGNOSIS — G4700 Insomnia, unspecified: Secondary | ICD-10-CM

## 2011-08-23 NOTE — Telephone Encounter (Signed)
Ok to refill #90.   

## 2011-08-23 NOTE — Telephone Encounter (Signed)
This paper was for a prior auth, verified with Dr.Knapp and gave to Vernona Rieger to renew prior Berkley Harvey that is dated to expire 08/30/11. Refill was not necessary at this time.

## 2011-08-27 ENCOUNTER — Telehealth: Payer: Self-pay | Admitting: Family Medicine

## 2011-08-27 DIAGNOSIS — G4700 Insomnia, unspecified: Secondary | ICD-10-CM

## 2011-08-27 MED ORDER — ZOLPIDEM TARTRATE ER 12.5 MG PO TBCR: 12.5000 mg | EXTENDED_RELEASE_TABLET | Freq: Every evening | ORAL | Status: DC | PRN

## 2011-08-27 NOTE — Telephone Encounter (Signed)
Got prior auth approved and called in [place] for 90 days but when i talked to the guy she still had 1 refill as of 06/13/11 #90 so i called in #90 and it will be processed on 4/15 but will not be given to her til 4/23. However i have tried to contact pt to call [employer] to give them the account info to make payment arrangements before given med

## 2011-11-26 ENCOUNTER — Ambulatory Visit: Payer: 59

## 2011-12-03 ENCOUNTER — Other Ambulatory Visit: Payer: 59

## 2011-12-03 ENCOUNTER — Telehealth: Payer: Self-pay | Admitting: *Deleted

## 2011-12-03 ENCOUNTER — Telehealth: Payer: Self-pay | Admitting: Internal Medicine

## 2011-12-03 DIAGNOSIS — E7800 Pure hypercholesterolemia, unspecified: Secondary | ICD-10-CM

## 2011-12-03 DIAGNOSIS — E119 Type 2 diabetes mellitus without complications: Secondary | ICD-10-CM

## 2011-12-03 LAB — COMPREHENSIVE METABOLIC PANEL
ALT: 28 U/L (ref 0–35)
Albumin: 4.4 g/dL (ref 3.5–5.2)
Alkaline Phosphatase: 55 U/L (ref 39–117)
CO2: 30 mEq/L (ref 19–32)
Glucose, Bld: 105 mg/dL — ABNORMAL HIGH (ref 70–99)
Potassium: 4.2 mEq/L (ref 3.5–5.3)
Sodium: 143 mEq/L (ref 135–145)
Total Bilirubin: 0.3 mg/dL (ref 0.3–1.2)
Total Protein: 6.4 g/dL (ref 6.0–8.3)

## 2011-12-03 LAB — LIPID PANEL
Cholesterol: 154 mg/dL (ref 0–200)
Triglycerides: 123 mg/dL (ref <150)

## 2011-12-03 LAB — HEMOGLOBIN A1C: Mean Plasma Glucose: 134 mg/dL — ABNORMAL HIGH (ref <117)

## 2011-12-03 NOTE — Telephone Encounter (Signed)
Spoke with patient and she stated she has enough Ambien and an rx at her appt would be fine.

## 2011-12-03 NOTE — Telephone Encounter (Signed)
She has appt next week--I'd rather give her printed rx at her visit, unless she needs sooner (I believe it is a 6 month f/u visit, for rx refills)

## 2011-12-03 NOTE — Telephone Encounter (Signed)
Left message for patient to call me back to relay message to her and make sure she has enough Ambien to make it until her appt.

## 2011-12-04 LAB — MICROALBUMIN / CREATININE URINE RATIO
Creatinine, Urine: 96.1 mg/dL
Microalb, Ur: 1.82 mg/dL (ref 0.00–1.89)

## 2011-12-10 ENCOUNTER — Ambulatory Visit (INDEPENDENT_AMBULATORY_CARE_PROVIDER_SITE_OTHER): Payer: 59 | Admitting: Family Medicine

## 2011-12-10 ENCOUNTER — Encounter: Payer: Self-pay | Admitting: Family Medicine

## 2011-12-10 VITALS — BP 140/88 | HR 72 | Ht <= 58 in | Wt 152.0 lb

## 2011-12-10 DIAGNOSIS — Z0000 Encounter for general adult medical examination without abnormal findings: Secondary | ICD-10-CM

## 2011-12-10 DIAGNOSIS — R7301 Impaired fasting glucose: Secondary | ICD-10-CM

## 2011-12-10 DIAGNOSIS — E559 Vitamin D deficiency, unspecified: Secondary | ICD-10-CM

## 2011-12-10 DIAGNOSIS — E7800 Pure hypercholesterolemia, unspecified: Secondary | ICD-10-CM

## 2011-12-10 DIAGNOSIS — F329 Major depressive disorder, single episode, unspecified: Secondary | ICD-10-CM

## 2011-12-10 DIAGNOSIS — I10 Essential (primary) hypertension: Secondary | ICD-10-CM

## 2011-12-10 DIAGNOSIS — M8580 Other specified disorders of bone density and structure, unspecified site: Secondary | ICD-10-CM

## 2011-12-10 DIAGNOSIS — G4700 Insomnia, unspecified: Secondary | ICD-10-CM

## 2011-12-10 DIAGNOSIS — M949 Disorder of cartilage, unspecified: Secondary | ICD-10-CM

## 2011-12-10 LAB — POCT URINALYSIS DIPSTICK
Bilirubin, UA: NEGATIVE
Glucose, UA: NEGATIVE
Ketones, UA: NEGATIVE
Leukocytes, UA: NEGATIVE
Nitrite, UA: NEGATIVE
pH, UA: 5

## 2011-12-10 MED ORDER — PAROXETINE HCL 40 MG PO TABS: 40.0000 mg | ORAL_TABLET | ORAL | Status: DC

## 2011-12-10 MED ORDER — LISINOPRIL 10 MG PO TABS: 10.0000 mg | ORAL_TABLET | Freq: Every day | ORAL | Status: DC

## 2011-12-10 MED ORDER — SIMVASTATIN 20 MG PO TABS: 20.0000 mg | ORAL_TABLET | Freq: Every day | ORAL | Status: DC

## 2011-12-10 MED ORDER — ZOLPIDEM TARTRATE ER 12.5 MG PO TBCR: 12.5000 mg | EXTENDED_RELEASE_TABLET | Freq: Every evening | ORAL | Status: DC | PRN

## 2011-12-10 NOTE — Progress Notes (Signed)
Chief Complaint  Patient presents with  . Annual Exam    annual CPE/med check with pelvic exam.    Depression: Paxil doesn't seem to be working quite as well in the past, as sometimes she notices a little bit of depression creeping in more frequently than in past. Has some bad days, rarely has suicidal thoughts on those days, but no plan.  Hyperlipidemia follow-up:  Patient is reportedly following a low-fat, low cholesterol diet.  Compliant with medications and denies medication side effects.  Breast cancer: Saw Dr. Donnie Coffin last fall, and was told everything was good.  Due again this fall.  Denies any breast concerns.  Osteopenia/osteoporosis: Tolerating fosamax without problems.  Denies dysphagia or chest pain.  DEXA's done through Dr. Renelda Loma office  Insomnia: requires Ambien CR nightly.  Denies side effects or strange behaviors.  Needs refill  Health Maintenance: Immunization History  Administered Date(s) Administered  . Influenza Split 02/07/2009, 04/10/2010, 04/19/2011  . Pneumococcal Polysaccharide 06/04/2001, 11/15/2010  . Td 06/04/2001  . Tdap 11/15/2010   Last Pap smear: s/p hysterectomy Last mammogram: 01/2011 Last colonoscopy: 12/2010 Last DEXA: through Dr. Donnie Coffin, due to see him again in Fall Ophtho: yearly, 04/2011 Dentist: twice yearly Exercise: walks twice a week  Past Medical History  Diagnosis Date  . Breast cancer R breast(lumpectomy,chemo,radiation,tamoxifen) Dr.Rubin  . Hypertension resolved after weight loss surgery  . Diabetes mellitus resolved after weightr loss surgery  . Microalbuminuria h/o  . Iron deficiency h/o  . Depression   . Anxiety   . Sleep apnea   . Osteopenia (monitored by Dr Donnie Coffin)  . Plantar fasciitis (07') DrRegal    s/p surgical release 01/2011  . Vitamin d deficiency   . OA (osteoarthritis) of knee   . Insomnia chronic  . Allergy     fall seasonal  . Blood transfusion     Platlet transfusion when on heparin  . Hyperlipidemia      Past Surgical History  Procedure Date  . Breast lumpectomy right breast 11/1999  . Abdominal hysterectomy and RSO(endometriosis) 2000  . Appendectomy   . Cuboid stress fracture right 12/2002  . Left shoulder fracture repair (DrMortensen) 12/05  . 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 Myringotomy tubes B/L, multiple sets  . Foot surgery left 2008  . Colonoscopy 2002, 2012  . Tonsillectomy and adenoidectomy   . Plantar fasciitis release 01/30/11    R foot, Dr. Charlsie Merles    History   Social History  . Marital Status: Married    Spouse Name: N/A    Number of Children: N/A  . Years of Education: N/A   Occupational History  . Not on file.   Social History Main Topics  . Smoking status: Former Smoker    Quit date: 01/28/1996  . Smokeless tobacco: Never Used  . Alcohol Use: Yes     glass of wine or beer maybe once a month.  . Drug Use: No  . Sexually Active: Not on file   Other Topics Concern  . Not on file   Social History Narrative   Works at [employer], as a copy and Chartered loss adjuster.  Lives with husband, 2 dogs, 1 cat, 10 fish    Family History  Problem Relation Age of Onset  . Dementia Mother   . Stroke Mother 40    due to aneurysm  . Macular degeneration Mother   . Cancer Mother     colon cancer  . Colon cancer Mother   . Pancreatic cancer Father   .  Cancer Father   . Hyperlipidemia Sister   . Hyperlipidemia Brother   . Diabetes Brother   . Diabetes Maternal Grandmother   . Hyperlipidemia Sister   . Breast cancer Cousin   . Breast cancer Cousin    Current Outpatient Prescriptions on File Prior to Visit  Medication Sig Dispense Refill  . alendronate (FOSAMAX) 70 MG tablet Take 70 mg by mouth every 7 (seven) days. Take with a full glass of water on an empty stomach.       . Calcium Carbonate-Vitamin D (CALTRATE 600+D) 600-400 MG-UNIT per tablet Take 1 tablet by mouth 2 (two) times daily.       . cholecalciferol (VITAMIN D) 1000 UNITS tablet Take 1,000  Units by mouth daily.        Marland Kitchen Lysine 1000 MG TABS Take 1 tablet by mouth daily as needed. Cold sores      . Multiple Vitamins-Minerals (MULTIVITAMIN WITH MINERALS) tablet Take 1 tablet by mouth daily.        . Omega-3 Fatty Acids (FISH OIL) 1200 MG CAPS Take 1 capsule by mouth daily. 2 nightly       . simethicone (MYLICON) 80 MG chewable tablet Chew 80 mg by mouth as needed.        . vitamin C (ASCORBIC ACID) 500 MG tablet Take 500 mg by mouth daily.        . vitamin E 400 UNIT capsule Take 400 Units by mouth daily.        Marland Kitchen DISCONTD: PARoxetine (PAXIL) 40 MG tablet Take 1 tablet (40 mg total) by mouth every morning.  90 tablet  3  . DISCONTD: simvastatin (ZOCOR) 20 MG tablet Take 1 tablet (20 mg total) by mouth at bedtime.  90 tablet  3  . DISCONTD: zolpidem (AMBIEN CR) 12.5 MG CR tablet Take 1 tablet (12.5 mg total) by mouth at bedtime as needed.  90 tablet  0  . lisinopril (PRINIVIL,ZESTRIL) 10 MG tablet Take 1 tablet (10 mg total) by mouth daily.  30 tablet  1  (lisinopril just started today)  Allergies  Allergen Reactions  . Heparin Other (See Comments)    Low platelets  . Niacin And Related Other (See Comments)    Blotchy/itchy  . Penicillins Rash   ROS:  The patient denies anorexia, fever, weight changes, headaches,  vision changes, decreased hearing, ear pain, sore throat, breast concerns, chest pain, palpitations, dizziness, syncope, dyspnea on exertion, cough, swelling, nausea, vomiting, diarrhea, constipation, abdominal pain, melena, hematochezia, indigestion/heartburn, hematuria, incontinence, dysuria, vaginal bleeding, discharge, odor or itch, genital lesions, joint pains, numbness, tingling, weakness, tremor, suspicious skin lesions, depression, anxiety, abnormal bleeding/bruising, or enlarged lymph nodes.  Occasionally checks BP at [facility], and is slightly high--can't recall #'s. Previously, BP's had always been fine, but over the last 1-2 years it has been creeping  up.  PHYSICAL EXAM: BP 140/88  Pulse 72  Ht 4\' 10"  (1.473 m)  Wt 152 lb (68.947 kg)  BMI 31.77 kg/m2 160/100 on repeat by MD, [place] General Appearance:  Alert, cooperative, no distress, appears stated age   Head:  Normocephalic, without obvious abnormality, atraumatic   Eyes:  PERRL, conjunctiva/corneas clear, EOM's intact, fundi  benign   Ears:  Normal TM's and external ear canals.   Nose:  Nasal mucosa normal  Throat:  Lips, mucosa, and tongue normal; teeth and gums normal   Neck:  Supple, no lymphadenopathy; thyroid: no enlargement/tenderness/nodules; no carotid  bruit or JVD   Back:  Spine nontender, no curvature, ROM normal, no CVA tenderness   Lungs:  Clear to auscultation bilaterally without wheezes, rales or ronchi; respirations unlabored   Chest Wall:  No tenderness or deformity   Heart:  Regular rate and rhythm, S1 and S2 normal, no murmur, rub  or gallop   Breast Exam:  R breast--s/p surgery with scarring and some skin changes related to previous radiation.  I feel some fibroglandular changes along entire inferior R breast, with no focal mass. No tenderness, nipple discharge or inversion. No axillary lymphadenopathy   Abdomen:  Soft, nondistended, normoactive bowel sounds,  no masses, no hepatosplenomegaly   Genitalia:  Normal external genitalia without lesions. BUS and vagina normal; Bimanual exam revealed surgically absent uterus. No adnexal masses. She is mildly tender on exam of adnexa bilaterally, mainly with internal exam.  No mass appreciated. No mass, rebound tenderness or guarding   Rectal:  Normal tone, no masses or tenderness; guaiac negative stool   Extremities:  No clubbing, cyanosis or edema. She is nontender over tibia and anterior tibialis muscles   Pulses:  2+ and symmetric all extremities   Skin:  Skin color, texture, turgor normal, no rashes or lesions   Lymph nodes:  Cervical, supraclavicular, and axillary nodes normal   Neurologic:  CNII-XII intact, normal  strength, sensation and gait; reflexes 2+ and symmetric throughout   Psych: Normal mood, affect, hygiene and grooming.   Lab Results  Component Value Date   CHOL 154 12/03/2011   HDL 46 12/03/2011   LDLCALC 83 12/03/2011   TRIG 123 12/03/2011   CHOLHDL 3.3 12/03/2011   Lab Results  Component Value Date   HGBA1C 6.3* 12/03/2011     Chemistry      Component Value Date/Time   NA 143 12/03/2011 0952   K 4.2 12/03/2011 0952   CL 106 12/03/2011 0952   CO2 30 12/03/2011 0952   BUN 11 12/03/2011 0952   CREATININE 0.48* 12/03/2011 0952   CREATININE 0.49* 01/31/2011 1536      Component Value Date/Time   CALCIUM 9.4 12/03/2011 0952   ALKPHOS 55 12/03/2011 0952   AST 27 12/03/2011 0952   ALT 28 12/03/2011 0952   BILITOT 0.3 12/03/2011 0952     Fasting glucose 105 Normal urine microalbumin/creatinine ratio   ASSESSMENT/PLAN: 1. Routine general medical examination at a health care facility  POCT Urinalysis Dipstick, Visual acuity screening  2. Pure hypercholesterolemia  simvastatin (ZOCOR) 20 MG tablet  3. Unspecified vitamin D deficiency    4. Insomnia  zolpidem (AMBIEN CR) 12.5 MG CR tablet  5. Osteopenia     per Dr. Donnie Coffin (pt reports osteoporosis)--to discuss with Dr. Donnie Coffin when next DEXA scan is due (at her f/u this fall)  6. Depressive disorder, not elsewhere classified  PARoxetine (PAXIL) 40 MG tablet  7. Insomnia  zolpidem (AMBIEN CR) 12.5 MG CR tablet   controlled by Ambien CR  8. Essential hypertension, benign  lisinopril (PRINIVIL,ZESTRIL) 10 MG tablet  9. Impaired fasting glucose     Hypertension--start lisinopril. Monitor BP, low sodium diet, exercise, weight loss.  F/u 4-6 weeks with list of BP's and plan to do b-met at visit.  Depression--consider adding Wellbutrin (vs changing to SNRI or other med).  For now, recommend stress reduction, consider counseling; exercise.  Hyperlipidemia--well controlled.  Continue current meds.  Diabetes--h/o diabetes prior to weight loss surgery.   Now falls under criteria of pre-diabetes/impaired fasting glucose.  A1c is creeping up.  Discussed need for exercise, weight  loss.  Discussed monthly self breast exams and yearly mammograms; at least 30 minutes of aerobic activity at least 5 days/week; proper sunscreen use reviewed; healthy diet, including goals of calcium and vitamin D intake and alcohol recommendations (less than or equal to 1 drink/day) reviewed; regular seatbelt use; changing batteries in smoke detectors.  Immunization recommendations discussed--UTD, yearly flu shots recommended.  Colonoscopy recommendations reviewed, UTD

## 2011-12-10 NOTE — Patient Instructions (Addendum)
HEALTH MAINTENANCE RECOMMENDATIONS:  It is recommended that you get at least 30 minutes of aerobic exercise at least 5 days/week (for weight loss, you may need as much as 60-90 minutes). This can be any activity that gets your heart rate up. This can be divided in 10-15 minute intervals if needed, but try and build up your endurance at least once a week.  Weight bearing exercise is also recommended twice weekly.  Eat a healthy diet with lots of vegetables, fruits and fiber.  "Colorful" foods have a lot of vitamins (ie green vegetables, tomatoes, red peppers, etc).  Limit sweet tea, regular sodas and alcoholic beverages, all of which has a lot of calories and sugar.  Up to 1 alcoholic drink daily may be beneficial for women (unless trying to lose weight, watch sugars).  Drink a lot of water.  Calcium recommendations are 1200-1500 mg daily (1500 mg for postmenopausal women or women without ovaries), and vitamin D 1000 IU daily.  This should be obtained from diet and/or supplements (vitamins), and calcium should not be taken all at once, but in divided doses.  Monthly self breast exams and yearly mammograms for women over the age of 74 is recommended.  Sunscreen of at least SPF 30 should be used on all sun-exposed parts of the skin when outside between the hours of 10 am and 4 pm (not just when at beach or pool, but even with exercise, golf, tennis, and yard work!)  Use a sunscreen that says "broad spectrum" so it covers both UVA and UVB rays, and make sure to reapply every 1-2 hours.  Remember to change the batteries in your smoke detectors when changing your clock times in the spring and fall.  Use your seat belt every time you are in a car, and please drive safely and not be distracted with cell phones and texting while driving.  Start lisinopril daily.  Call us if you develop a significant cough related to the medication (so it can be changed). Try and check blood pressure at least once a week,  write it down, and bring to your visit.  If you check with your own machine, please bring it to visit.  Try and exercise daily, lose weight, and follow low sodium diet to help get blood pressures down.  Goal blood pressure is <130/80  2 Gram Low Sodium Diet A 2 gram sodium diet restricts the amount of sodium in the diet to no more than 2 g or 2000 mg daily. Limiting the amount of sodium is often used to help lower blood pressure. It is important if you have heart, liver, or kidney problems. Many foods contain sodium for flavor and sometimes as a preservative. When the amount of sodium in a diet needs to be low, it is important to know what to look for when choosing foods and drinks. The following includes some information and guidelines to help make it easier for you to adapt to a low sodium diet. QUICK TIPS  Do not add salt to food.   Avoid convenience items and fast food.   Choose unsalted snack foods.   Buy lower sodium products, often labeled as "lower sodium" or "no salt added."   Check food labels to learn how much sodium is in 1 serving.   When eating at a restaurant, ask that your food be prepared with less salt or none, if possible.  READING FOOD LABELS FOR SODIUM INFORMATION The nutrition facts label is a good place to find  how much sodium is in foods. Look for products with no more than 500 to 600 mg of sodium per meal and no more than 150 mg per serving. Remember that 2 g = 2000 mg. The food label may also list foods as:  Sodium-free: Less than 5 mg in a serving.   Very low sodium: 35 mg or less in a serving.   Low-sodium: 140 mg or less in a serving.   Light in sodium: 50% less sodium in a serving. For example, if a food that usually has 300 mg of sodium is changed to become light in sodium, it will have 150 mg of sodium.   Reduced sodium: 25% less sodium in a serving. For example, if a food that usually has 400 mg of sodium is changed to reduced sodium, it will have 300 mg  of sodium.  CHOOSING FOODS Grains  Avoid: Salted crackers and snack items. Some cereals, including instant hot cereals. Bread stuffing and biscuit mixes. Seasoned rice or pasta mixes.   Choose: Unsalted snack items. Low-sodium cereals, oats, puffed wheat and rice, shredded wheat. English muffins and bread. Pasta.  Meats  Avoid: Salted, canned, smoked, spiced, pickled meats, including fish and poultry. Bacon, ham, sausage, cold cuts, hot dogs, anchovies.   Choose: Low-sodium canned tuna and salmon. Fresh or frozen meat, poultry, and fish.  Dairy  Avoid: Processed cheese and spreads. Cottage cheese. Buttermilk and condensed milk. Regular cheese.   Choose: Milk. Low-sodium cottage cheese. Yogurt. Sour cream. Low-sodium cheese.  Fruits and Vegetables  Avoid: Regular canned vegetables. Regular canned tomato sauce and paste. Frozen vegetables in sauces. Olives. Rosita Fire. Relishes. Sauerkraut.   Choose: Low-sodium canned vegetables. Low-sodium tomato sauce and paste. Frozen or fresh vegetables. Fresh and frozen fruit.  Condiments  Avoid: Canned and packaged gravies. Worcestershire sauce. Tartar sauce. Barbecue sauce. Soy sauce. Steak sauce. Ketchup. Onion, garlic, and table salt. Meat flavorings and tenderizers.   Choose: Fresh and dried herbs and spices. Low-sodium varieties of mustard and ketchup. Lemon juice. Tabasco sauce. Horseradish.  SAMPLE 2 GRAM SODIUM MEAL PLAN Breakfast / Sodium (mg)  1 cup low-fat milk / 143 mg   2 slices whole-wheat toast / 270 mg   1 tbs heart-healthy margarine / 153 mg   1 hard-boiled egg / 139 mg   1 small orange / 0 mg  Lunch / Sodium (mg)  1 cup raw carrots / 76 mg    cup hummus / 298 mg   1 cup low-fat milk / 143 mg    cup red grapes / 2 mg   1 whole-wheat pita bread / 356 mg  Dinner / Sodium (mg)  1 cup whole-wheat pasta / 2 mg   1 cup low-sodium tomato sauce / 73 mg   3 oz lean ground beef / 57 mg   1 small side salad (1 cup  raw spinach leaves,  cup cucumber,  cup yellow bell pepper) with 1 tsp olive oil and 1 tsp red wine vinegar / 25 mg  Snack / Sodium (mg)  1 container low-fat vanilla yogurt / 107 mg   3 graham cracker squares / 127 mg  Nutrient Analysis  Calories: 2033   Protein: 77 g   Carbohydrate: 282 g   Fat: 72 g   Sodium: 1971 mg  Document Released: 05/07/2005 Document Revised: 04/26/2011 Document Reviewed: 08/08/2009 [facility] Patient Information 2012 [place], [place].

## 2012-01-24 ENCOUNTER — Ambulatory Visit: Payer: 59 | Admitting: Family Medicine

## 2012-01-28 ENCOUNTER — Encounter: Payer: Self-pay | Admitting: Family Medicine

## 2012-01-28 ENCOUNTER — Ambulatory Visit (INDEPENDENT_AMBULATORY_CARE_PROVIDER_SITE_OTHER): Payer: 59 | Admitting: Family Medicine

## 2012-01-28 VITALS — BP 116/68 | HR 72 | Ht <= 58 in | Wt 150.0 lb

## 2012-01-28 DIAGNOSIS — I10 Essential (primary) hypertension: Secondary | ICD-10-CM

## 2012-01-28 DIAGNOSIS — Z79899 Other long term (current) drug therapy: Secondary | ICD-10-CM

## 2012-01-28 DIAGNOSIS — Z23 Encounter for immunization: Secondary | ICD-10-CM

## 2012-01-28 DIAGNOSIS — M62830 Muscle spasm of back: Secondary | ICD-10-CM

## 2012-01-28 DIAGNOSIS — M545 Low back pain: Secondary | ICD-10-CM

## 2012-01-28 DIAGNOSIS — M5380 Other specified dorsopathies, site unspecified: Secondary | ICD-10-CM

## 2012-01-28 LAB — POCT URINALYSIS DIPSTICK
Bilirubin, UA: NEGATIVE
Blood, UA: NEGATIVE
Glucose, UA: 250
Ketones, UA: NEGATIVE
Spec Grav, UA: 1.015
Urobilinogen, UA: NEGATIVE

## 2012-01-28 NOTE — Patient Instructions (Addendum)
Continue the lisinopril.  If the cough worsens, let us know. Continue to monitor blood pressure.  Goal is <130/80.  If you are frequently having <105 systolic (top number), especially if frequently <100, and/or if you are feeling dizzy, lightheaded, fatigued with these low blood pressures, please let us know, as we will likely want to adjust your dose.  Schedule visit if this occurs.  Continue low sodium diet and efforts at weight loss.  Continue with heat, massage and stretches for back.  Take flexeril at bedtime (muscle relaxant that you should have at home--call if you need a refill)

## 2012-01-28 NOTE — Progress Notes (Signed)
Chief Complaint  Patient presents with  . Hypertension    6 week follow up.   HPI: Patient presents to follow up on hypertension.  She was started on lisinopril at her CPE 6 weeks ago.  BP's at home have been running 103-127/67-85 (one time 137/90).  She felt a little queasy and light headed when she was taking lisinopril in the morning, but has done fine without side effects since changing it to taking at bedtime.  Feels less anxious.  She has just a small cough, very tolerable, not a bother.    C/o pain 3/10 bilateral back pain, R>L in paraspinous area. Tried back and body medication and vicodin as needed.  Heat and massage helps. Denies radiation of pain, numbness, tingling or weakness.  Some stomach upset after NSAIDs.  Depression--she reports she is doing better overall.  Past Medical History  Diagnosis Date  . Breast cancer R breast(lumpectomy,chemo,radiation,tamoxifen) Dr.Rubin  . Hypertension resolved after weight loss surgery  . Diabetes mellitus resolved after weightr loss surgery  . Microalbuminuria h/o  . Iron deficiency h/o  . Depression   . Anxiety   . Sleep apnea   . Osteopenia (monitored by Dr Donnie Coffin)  . Plantar fasciitis (07') DrRegal    s/p surgical release 01/2011  . Vitamin d deficiency   . OA (osteoarthritis) of knee   . Insomnia chronic  . Allergy     fall seasonal  . Blood transfusion     Platlet transfusion when on heparin  . Hyperlipidemia    Past Surgical History  Procedure Date  . Breast lumpectomy right breast 11/1999  . Abdominal hysterectomy and RSO(endometriosis) 2000  . Appendectomy   . Cuboid stress fracture right 12/2002  . Left shoulder fracture repair (DrMortensen) 12/05  . 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 Myringotomy tubes B/L, multiple sets  . Foot surgery left 2008  . Colonoscopy 2002, 2012  . Tonsillectomy and adenoidectomy   . Plantar fasciitis release 01/30/11    R foot, Dr. Charlsie Merles   History   Social History  . Marital  Status: Married    Spouse Name: N/A    Number of Children: N/A  . Years of Education: N/A   Occupational History  . Not on file.   Social History Main Topics  . Smoking status: Former Smoker    Quit date: 01/28/1996  . Smokeless tobacco: Never Used  . Alcohol Use: Yes     glass of wine or beer maybe once a month.  . Drug Use: No  . Sexually Active: Not on file   Other Topics Concern  . Not on file   Social History Narrative   Works at [employer], as a copy and Chartered loss adjuster.  Lives with husband, 2 dogs, 1 cat, 10 fish    Current Outpatient Prescriptions on File Prior to Visit  Medication Sig Dispense Refill  . alendronate (FOSAMAX) 70 MG tablet Take 70 mg by mouth every 7 (seven) days. Take with a full glass of water on an empty stomach.       . Calcium Carbonate-Vitamin D (CALTRATE 600+D) 600-400 MG-UNIT per tablet Take 1 tablet by mouth 2 (two) times daily.       . cholecalciferol (VITAMIN D) 1000 UNITS tablet Take 1,000 Units by mouth daily.        Marland Kitchen HYDROcodone-acetaminophen (VICODIN) 5-500 MG per tablet Take 1 tablet by mouth as needed. TAKES AS NEEDED FOR FOOT PAIN- rx'd by Dr.Regal @@ [facility]      .  lisinopril (PRINIVIL,ZESTRIL) 10 MG tablet Take 1 tablet (10 mg total) by mouth daily.  30 tablet  1  . Multiple Vitamins-Minerals (MULTIVITAMIN WITH MINERALS) tablet Take 1 tablet by mouth daily.        . Omega-3 Fatty Acids (FISH OIL) 1200 MG CAPS Take 1 capsule by mouth daily. 2 nightly      . PARoxetine (PAXIL) 40 MG tablet Take 1 tablet (40 mg total) by mouth every morning.  90 tablet  3  . simethicone (MYLICON) 80 MG chewable tablet Chew 80 mg by mouth as needed.        . simvastatin (ZOCOR) 20 MG tablet Take 1 tablet (20 mg total) by mouth at bedtime.  90 tablet  3  . vitamin C (ASCORBIC ACID) 500 MG tablet Take 500 mg by mouth daily.        . vitamin E 400 UNIT capsule Take 400 Units by mouth daily.        Marland Kitchen zolpidem (AMBIEN CR) 12.5 MG CR tablet Take 1  tablet (12.5 mg total) by mouth at bedtime as needed.  90 tablet  1  . Lysine 1000 MG TABS Take 1 tablet by mouth daily as needed. Cold sores       Allergies  Allergen Reactions  . Heparin Other (See Comments)    Low platelets  . Niacin And Related Other (See Comments)    Blotchy/itchy  . Penicillins Rash    ROS:  Denies fevers.  Getting up to void in the middle of the night, slightly strong smelling. Denies urgency or pain with urination.  Denies nausea, vomiting, diarrhea, URI complaints, sinus pain, skin rashes or other concerns.  Moods good. No chest pain, shortness of breath, edema, skin rash.  PHYSICAL EXAM: BP 120/62  Pulse 72  Ht 4\' 10"  (1.473 m)  Wt 150 lb (68.04 kg)  BMI 31.35 kg/m2 Pt's machine 109/71 116/68 LA by MD Neck: no lymphadenopathy Heart: regular rate and rhythm without murmur Lungs: clear bilaterally Back: no spine or CVA tenderness.  Tender bilaterally at paraspinous muscles at level of her bra Abdomen: soft, nontender, no organomegaly or mass Extremities: no edema Skin: no rash Neuro: alert and oriented.  DTR's 2+ and symmetric at knees bilaterally.  Normal strength, sensation, gait  ASSESSMENT/PLAN: 1. Need for prophylactic vaccination and inoculation against influenza  Flu vaccine greater than or equal to 3yo preservative free IM  2. Essential hypertension, benign  Basic metabolic panel  3. Lumbago  POCT urinalysis dipstick  4. Muscle spasm of back    5. Encounter for long-term (current) use of other medications  Basic metabolic panel   HTN--well controlled on lisinopril. Check b-met today.  If normal, send Rx to [facility] x 6 months.  Back pain--appears muscular in etiology.  Check urine dip given some increase in frequency and odor. Continue with heat, massage.   Has flexeril at home from previous neck issue--recommend trying Flexeril in the evenings.  Use NSAID's with caution.  F/u 6 months.

## 2012-01-29 ENCOUNTER — Encounter: Payer: Self-pay | Admitting: Family Medicine

## 2012-01-29 LAB — BASIC METABOLIC PANEL
CO2: 28 mEq/L (ref 19–32)
Chloride: 107 mEq/L (ref 96–112)
Potassium: 4.2 mEq/L (ref 3.5–5.3)

## 2012-02-02 ENCOUNTER — Other Ambulatory Visit: Payer: Self-pay | Admitting: Oncology

## 2012-02-02 DIAGNOSIS — M810 Age-related osteoporosis without current pathological fracture: Secondary | ICD-10-CM

## 2012-02-02 DIAGNOSIS — M8580 Other specified disorders of bone density and structure, unspecified site: Secondary | ICD-10-CM

## 2012-02-02 DIAGNOSIS — C50919 Malignant neoplasm of unspecified site of unspecified female breast: Secondary | ICD-10-CM

## 2012-02-04 NOTE — Telephone Encounter (Signed)
Please refill x1, this time only, then forward to Primary Care MD, Dr Joselyn Arrow,  patient was released from practice 01/2011.

## 2012-02-13 ENCOUNTER — Other Ambulatory Visit: Payer: Self-pay | Admitting: Family Medicine

## 2012-03-17 ENCOUNTER — Telehealth: Payer: Self-pay | Admitting: Family Medicine

## 2012-03-17 MED ORDER — LISINOPRIL 10 MG PO TABS: 10.0000 mg | ORAL_TABLET | Freq: Every day | ORAL | Status: DC

## 2012-03-17 NOTE — Telephone Encounter (Signed)
done

## 2012-04-20 LAB — HM DIABETES EYE EXAM

## 2012-04-26 ENCOUNTER — Other Ambulatory Visit: Payer: Self-pay | Admitting: Oncology

## 2012-04-29 ENCOUNTER — Telehealth: Payer: Self-pay | Admitting: Family Medicine

## 2012-04-29 DIAGNOSIS — C50919 Malignant neoplasm of unspecified site of unspecified female breast: Secondary | ICD-10-CM

## 2012-04-29 DIAGNOSIS — M8580 Other specified disorders of bone density and structure, unspecified site: Secondary | ICD-10-CM

## 2012-04-29 DIAGNOSIS — M810 Age-related osteoporosis without current pathological fracture: Secondary | ICD-10-CM

## 2012-04-29 MED ORDER — ALENDRONATE SODIUM 70 MG PO TABS: 70.0000 mg | ORAL_TABLET | ORAL | Status: DC

## 2012-04-29 NOTE — Telephone Encounter (Signed)
Done

## 2012-06-10 ENCOUNTER — Ambulatory Visit (INDEPENDENT_AMBULATORY_CARE_PROVIDER_SITE_OTHER): Payer: 59 | Admitting: Family Medicine

## 2012-06-10 ENCOUNTER — Encounter: Payer: Self-pay | Admitting: Family Medicine

## 2012-06-10 VITALS — BP 128/74 | HR 72 | Ht <= 58 in | Wt 148.0 lb

## 2012-06-10 DIAGNOSIS — J069 Acute upper respiratory infection, unspecified: Secondary | ICD-10-CM

## 2012-06-10 DIAGNOSIS — B001 Herpesviral vesicular dermatitis: Secondary | ICD-10-CM

## 2012-06-10 DIAGNOSIS — H938X9 Other specified disorders of ear, unspecified ear: Secondary | ICD-10-CM

## 2012-06-10 DIAGNOSIS — H6120 Impacted cerumen, unspecified ear: Secondary | ICD-10-CM

## 2012-06-10 DIAGNOSIS — G4700 Insomnia, unspecified: Secondary | ICD-10-CM

## 2012-06-10 DIAGNOSIS — B009 Herpesviral infection, unspecified: Secondary | ICD-10-CM

## 2012-06-10 MED ORDER — ZOLPIDEM TARTRATE ER 12.5 MG PO TBCR: 12.5000 mg | EXTENDED_RELEASE_TABLET | Freq: Every evening | ORAL | Status: DC | PRN

## 2012-06-10 MED ORDER — VALACYCLOVIR HCL 1 G PO TABS: ORAL_TABLET | ORAL | Status: DC

## 2012-06-10 NOTE — Progress Notes (Signed)
Chief Complaint  Patient presents with  . Ear Fullness    right ear fullness x 1 week. Also has gotten a cold sore in the last 24 hours. Needs refill on Ambien CR.   HPI: R ear has been plugged for a little over a week.  Denies pain.  Doesn't pop, just feels plugged.  Sometimes it feels like it wants to pop, but it doesn't pop or unplug.  Has a mild humming in that ear, louder than usual.  Woke up this morning with a headache, runny nose today, as well as sore throat, mild cough.  ?lowgrade fever throughout the week (subjective, doesn't have thermometer, unsure if hot flash vs fever).  Some decrease of hearing in R ear, more chronic, unrelated to this plugging.  Denies ear pain, sore throat, cough, shortness of breath.  Has been using benadryl or sudafed at night, trying to help the ear, but no improvement.  Took Excedrin Migraine today--didn't help  Cold sore x 24 hours only. Requesting medication to treat it. Needs refill on her Ambien CR--continues to work well.  Past Medical History  Diagnosis Date  . Breast cancer R breast(lumpectomy,chemo,radiation,tamoxifen) Dr.Rubin  . Hypertension resolved after weight loss surgery  . Diabetes mellitus resolved after weightr loss surgery  . Microalbuminuria h/o  . Iron deficiency h/o  . Depression   . Anxiety   . Sleep apnea   . Osteopenia (monitored by Dr Donnie Coffin)  . Plantar fasciitis (07') DrRegal    s/p surgical release 01/2011  . Vitamin D deficiency   . OA (osteoarthritis) of knee   . Insomnia chronic  . Allergy     fall seasonal  . Blood transfusion     Platlet transfusion when on heparin  . Hyperlipidemia    Past Surgical History  Procedure Date  . Breast lumpectomy right breast 11/1999  . Abdominal hysterectomy and RSO(endometriosis) 2000  . Appendectomy   . Cuboid stress fracture right 12/2002  . Left shoulder fracture repair (DrMortensen) 12/05  . 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 Myringotomy tubes B/L, multiple sets  .  Foot surgery left 2008  . Colonoscopy 2002, 2012  . Tonsillectomy and adenoidectomy   . Plantar fasciitis release 01/30/11    R foot, Dr. Charlsie Merles   History   Social History  . Marital Status: Married    Spouse Name: N/A    Number of Children: N/A  . Years of Education: N/A   Occupational History  . Not on file.   Social History Main Topics  . Smoking status: Former Smoker    Quit date: 01/28/1996  . Smokeless tobacco: Never Used  . Alcohol Use: Yes     Comment: glass of wine or beer maybe once a month.  . Drug Use: No  . Sexually Active: Not on file   Other Topics Concern  . Not on file   Social History Narrative   Works at [employer], as a copy and Chartered loss adjuster.  Lives with husband, 2 dogs, 1 cat, 10 fish   Current Outpatient Prescriptions on File Prior to Visit  Medication Sig Dispense Refill  . alendronate (FOSAMAX) 70 MG tablet Take 1 tablet (70 mg total) by mouth every 7 (seven) days. Take with a full glass of water on an empty stomach.  12 tablet  1  . Calcium Carbonate-Vitamin D (CALTRATE 600+D) 600-400 MG-UNIT per tablet Take 1 tablet by mouth 2 (two) times daily.       . cholecalciferol (VITAMIN D) 1000  UNITS tablet Take 1,000 Units by mouth daily.        Marland Kitchen lisinopril (PRINIVIL,ZESTRIL) 10 MG tablet Take 1 tablet (10 mg total) by mouth daily.  90 tablet  1  . Lysine 1000 MG TABS Take 1 tablet by mouth daily as needed. Cold sores      . Multiple Vitamins-Minerals (MULTIVITAMIN WITH MINERALS) tablet Take 1 tablet by mouth daily.        . Omega-3 Fatty Acids (FISH OIL) 1200 MG CAPS Take 1 capsule by mouth daily. 2 nightly      . PARoxetine (PAXIL) 40 MG tablet Take 1 tablet (40 mg total) by mouth every morning.  90 tablet  3  . simethicone (MYLICON) 80 MG chewable tablet Chew 80 mg by mouth as needed.        . simvastatin (ZOCOR) 20 MG tablet Take 1 tablet (20 mg total) by mouth at bedtime.  90 tablet  3  . vitamin C (ASCORBIC ACID) 500 MG tablet Take 500 mg by  mouth daily.        . vitamin E 400 UNIT capsule Take 400 Units by mouth daily.        Marland Kitchen zolpidem (AMBIEN CR) 12.5 MG CR tablet Take 1 tablet (12.5 mg total) by mouth at bedtime as needed.  90 tablet  1  . HYDROcodone-acetaminophen (VICODIN) 5-500 MG per tablet Take 1 tablet by mouth as needed. TAKES AS NEEDED FOR FOOT PAIN- rx'd by Dr.Regal @@ [facility]       Allergies  Allergen Reactions  . Heparin Other (See Comments)    Low platelets  . Niacin And Related Other (See Comments)    Blotchy/itchy  . Penicillins Rash   ROS:  Denies nausea, vomiting, diarrhea, skin rash.  +cold sore. Denies chest pain, palpitations, shortness of breath.  Sinus headache today, no other headaches. +hearing loss, plugging.  No dizziness, vertigo  PHYSICAL EXAM: BP 128/74  Pulse 72  Ht 4\' 10"  (1.473 m)  Wt 148 lb (67.132 kg)  BMI 30.93 kg/m2 Well developed, pleasant female in no distress HEENT:  L TM and EAC normal.  R TM has blue tube, with some surrounding wax (surrounding the tube, deep in canal).  After brief lavage, some wax was cleared from canal.  She developed some discomfort, so irrigation was stopped.  On recheck, wax was gone, tube still in place. Eardrum normal, without erythema. No drainage.n small ulceration with crusting at left lower lip.  No swelling or surrounding erythema. Nasal mucosa mildly edematous with clear mucus.  OP clear. Sinuses minimally tender Neck: no lymphadenopathy or mass  ASSESSMENT/PLAN:  1. Plugged feeling in ear    2. Insomnia  zolpidem (AMBIEN CR) 12.5 MG CR tablet   controlled by Ambien CR  3. Herpes labialis  valACYclovir (VALTREX) 1000 MG tablet  4. Cerumen impaction  Ear cerumen removal  5. URI (upper respiratory infection)      F/u with ENT re: hearing loss (more chronic, for full eval) F/u with ENT re: ?function of PE tube/plugging of ear.  Doesn't appear to be due to cerumen impaction, and symptoms preceded URI symptoms.  No evidence of  infection

## 2012-06-10 NOTE — Patient Instructions (Addendum)
Follow up your ear doctor if ear plugging persists.  Tube may no longer be functioning correctly.  We have made sure that wax is not contributing to the plugging.  Your cold symptoms may be making it worse. You can use decongestants cautiously--monitor your blood pressure while taking sudafed, and stop if your BP goes above 140/90

## 2012-06-12 ENCOUNTER — Telehealth: Payer: Self-pay | Admitting: *Deleted

## 2012-06-12 DIAGNOSIS — G4700 Insomnia, unspecified: Secondary | ICD-10-CM

## 2012-06-12 MED ORDER — ZOLPIDEM TARTRATE ER 12.5 MG PO TBCR: 12.5000 mg | EXTENDED_RELEASE_TABLET | Freq: Every evening | ORAL | Status: DC | PRN

## 2012-06-12 NOTE — Telephone Encounter (Signed)
Ok for #10 locally

## 2012-06-12 NOTE — Telephone Encounter (Signed)
Patient came by and brought her written rx for Ambien, said we needed to fax(so we did). She would like to know if you would call in #10 to hold her until mail order arrives? [facility] [address]

## 2012-06-12 NOTE — Telephone Encounter (Signed)
Called in and patient notified

## 2012-06-26 ENCOUNTER — Other Ambulatory Visit: Payer: Self-pay | Admitting: Family Medicine

## 2012-06-26 DIAGNOSIS — Z1231 Encounter for screening mammogram for malignant neoplasm of breast: Secondary | ICD-10-CM

## 2012-07-21 ENCOUNTER — Encounter: Payer: Self-pay | Admitting: Family Medicine

## 2012-07-21 ENCOUNTER — Ambulatory Visit (INDEPENDENT_AMBULATORY_CARE_PROVIDER_SITE_OTHER): Payer: 59 | Admitting: Family Medicine

## 2012-07-21 VITALS — BP 126/70 | HR 68 | Ht <= 58 in | Wt 151.0 lb

## 2012-07-21 DIAGNOSIS — F329 Major depressive disorder, single episode, unspecified: Secondary | ICD-10-CM

## 2012-07-21 DIAGNOSIS — Z853 Personal history of malignant neoplasm of breast: Secondary | ICD-10-CM

## 2012-07-21 DIAGNOSIS — F3289 Other specified depressive episodes: Secondary | ICD-10-CM

## 2012-07-21 DIAGNOSIS — E7800 Pure hypercholesterolemia, unspecified: Secondary | ICD-10-CM

## 2012-07-21 DIAGNOSIS — R7301 Impaired fasting glucose: Secondary | ICD-10-CM

## 2012-07-21 DIAGNOSIS — E119 Type 2 diabetes mellitus without complications: Secondary | ICD-10-CM

## 2012-07-21 DIAGNOSIS — E559 Vitamin D deficiency, unspecified: Secondary | ICD-10-CM

## 2012-07-21 DIAGNOSIS — I10 Essential (primary) hypertension: Secondary | ICD-10-CM

## 2012-07-21 LAB — POCT GLYCOSYLATED HEMOGLOBIN (HGB A1C): Hemoglobin A1C: 5.6

## 2012-07-21 NOTE — Patient Instructions (Signed)
Continue your current medications.  Have your pharmacy contact us when refills are needed.  We should have your lab results within just a day or two of when you have it drawn.  You can look up results yourself if you sign up for MyChart (if desired), or else we can call/mail results.

## 2012-07-21 NOTE — Progress Notes (Signed)
Chief Complaint  Patient presents with  . Diabetes    nonfasting med check. Also her oncologist, Dr. Donnie Coffin is no longer at [facility], wants to know what she should do.   Patient presents for med check, without any specific concerns except about Dr. Donnie Coffin no longer practicing.  She was past due to see him, never had labs done last year.  She did schedule mamogram, and gave my name as referring MD.  She denies any breast complaints.  H/o DM, improved after weight loss (surgical).  A1c's had been gradually creeping up, but she increased her exercise since her last visit.  She denies polydipsia, polyuria, vision changes.  Denies headaches, dizziness, chest pain, edema. She denies side effects of medications.  She continues to follow a low cholesterol diet.  Denies myalgias from medications.  Past Medical History  Diagnosis Date  . Breast cancer R breast(lumpectomy,chemo,radiation,tamoxifen) Dr.Rubin  . Hypertension resolved after weight loss surgery  . Diabetes mellitus resolved after weightr loss surgery  . Microalbuminuria h/o  . Iron deficiency h/o  . Depression   . Anxiety   . Sleep apnea   . Osteopenia (monitored by Dr Donnie Coffin)  . Plantar fasciitis (07') DrRegal    s/p surgical release 01/2011  . Vitamin D deficiency   . OA (osteoarthritis) of knee   . Insomnia chronic  . Allergy     fall seasonal  . Blood transfusion     Platlet transfusion when on heparin  . Hyperlipidemia    Past Surgical History  Procedure Laterality Date  . Breast lumpectomy  right breast 11/1999  . Abdominal hysterectomy  and RSO(endometriosis) 2000  . Appendectomy    . Cuboid stress fracture  right 12/2002  . Left shoulder fracture repair  (DrMortensen) 12/05  . 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 Myringotomy  tubes B/L, multiple sets  . Foot surgery  left 2008  . Colonoscopy  2002, 2012  . Tonsillectomy and adenoidectomy    . Plantar fasciitis release  01/30/11    R foot, Dr. Charlsie Merles   History   Social  History  . Marital Status: Married    Spouse Name: N/A    Number of Children: N/A  . Years of Education: N/A   Occupational History  . Not on file.   Social History Main Topics  . Smoking status: Former Smoker    Quit date: 01/28/1996  . Smokeless tobacco: Never Used  . Alcohol Use: Yes     Comment: glass of wine or beer maybe once a month.  . Drug Use: No  . Sexually Active: Not on file   Other Topics Concern  . Not on file   Social History Narrative   Works at [employer], as a copy and Chartered loss adjuster.  Lives with husband, 2 dogs, 1 cat, 10 fish   Current outpatient prescriptions:alendronate (FOSAMAX) 70 MG tablet, Take 1 tablet (70 mg total) by mouth every 7 (seven) days. Take with a full glass of water on an empty stomach., Disp: 12 tablet, Rfl: 1;  Calcium Carbonate-Vitamin D (CALTRATE 600+D) 600-400 MG-UNIT per tablet, Take 1 tablet by mouth 2 (two) times daily. , Disp: , Rfl: ;  cholecalciferol (VITAMIN D) 1000 UNITS tablet, Take 1,000 Units by mouth daily.  , Disp: , Rfl:  lisinopril (PRINIVIL,ZESTRIL) 10 MG tablet, Take 1 tablet (10 mg total) by mouth daily., Disp: 90 tablet, Rfl: 1;  Multiple Vitamins-Minerals (MULTIVITAMIN WITH MINERALS) tablet, Take 1 tablet by mouth daily.  , Disp: ,  Rfl: ;  Omega-3 Fatty Acids (FISH OIL) 1200 MG CAPS, Take 1 capsule by mouth daily. 2 nightly, Disp: , Rfl: ;  PARoxetine (PAXIL) 40 MG tablet, Take 1 tablet (40 mg total) by mouth every morning., Disp: 90 tablet, Rfl: 3 simethicone (MYLICON) 80 MG chewable tablet, Chew 80 mg by mouth as needed.  , Disp: , Rfl: ;  simvastatin (ZOCOR) 20 MG tablet, Take 1 tablet (20 mg total) by mouth at bedtime., Disp: 90 tablet, Rfl: 3;  vitamin C (ASCORBIC ACID) 500 MG tablet, Take 500 mg by mouth daily.  , Disp: , Rfl: ;  vitamin E 400 UNIT capsule, Take 400 Units by mouth daily.  , Disp: , Rfl:  zolpidem (AMBIEN CR) 12.5 MG CR tablet, Take 1 tablet (12.5 mg total) by mouth at bedtime as needed., Disp: 10  tablet, Rfl: 0;  Lysine 1000 MG TABS, Take 1 tablet by mouth daily as needed. Cold sores, Disp: , Rfl: ;  valACYclovir (VALTREX) 1000 MG tablet, Take 2 tablets at onset of cold sore, and repeat in 12 hours (4 tablets per episode of cold sore), Disp: 12 tablet, Rfl: 0  Allergies  Allergen Reactions  . Heparin Other (See Comments)    Low platelets  . Niacin And Related Other (See Comments)    Blotchy/itchy  . Penicillins Rash   ROS:  Denies weight change, fatigue, fevers, URI symptoms, cough, shortness of breath, nausea, vomiting, GI or GU complaints.  Denies bleeding/bruising, skin rash or other complaints.  Moods are good  PHYSICAL EXAM: BP 126/70  Pulse 68  Ht 4\' 10"  (1.473 m)  Wt 151 lb (68.493 kg)  BMI 31.57 kg/m2 Well developed, pleasant overweight/obese female in no distress Neck: no lymphadenopathy, thyromegaly or bruit Heart: regular rate and rhythm without murmur Lungs clear bilaterally Abdomen: soft, nontender, no mass Extremities: no edema Skin: no rash Psych: normal mood, affect, hygiene and grooming Neuro: alert and oriented, normal gait   Lab Results  Component Value Date   HGBA1C 5.6 07/21/2012   ASSESSMENT/PLAN  Type II or unspecified type diabetes mellitus without mention of complication, not stated as uncontrolled - Plan: HgB A1c, TSH  Depressive disorder, not elsewhere classified  Essential hypertension, benign - Plan: Comprehensive metabolic panel  Pure hypercholesterolemia - Plan: Lipid panel  Impaired fasting glucose  Unspecified vitamin D deficiency - Plan: Vitamin D 25 hydroxy  History of breast cancer - Plan: CBC with Differential, Comprehensive metabolic panel  Return for fasting labs to eval her treatment of hyperlipidemia.   HTN is well controlled on current regimen H/o DM--improved after weight loss, with persistent IFG per last labs.  Encouraged weight loss.  Continue daily exercise.  Can remain off medication.  Full set of labs ordered,  especially since she was past due for labs from oncologist.   Recommended she set f/u with another provider at [facility] heme-onc clinic.  Get mammo as scheduled.  F/u 6 months for med check and CPE, sooner prn.

## 2012-07-24 ENCOUNTER — Other Ambulatory Visit: Payer: 59

## 2012-07-24 ENCOUNTER — Other Ambulatory Visit: Payer: Self-pay | Admitting: Family Medicine

## 2012-07-24 DIAGNOSIS — I10 Essential (primary) hypertension: Secondary | ICD-10-CM

## 2012-07-24 DIAGNOSIS — E7800 Pure hypercholesterolemia, unspecified: Secondary | ICD-10-CM

## 2012-07-24 DIAGNOSIS — E559 Vitamin D deficiency, unspecified: Secondary | ICD-10-CM

## 2012-07-24 DIAGNOSIS — Z853 Personal history of malignant neoplasm of breast: Secondary | ICD-10-CM

## 2012-07-24 DIAGNOSIS — E119 Type 2 diabetes mellitus without complications: Secondary | ICD-10-CM

## 2012-07-24 LAB — CBC WITH DIFFERENTIAL/PLATELET
Basophils Absolute: 0 10*3/uL (ref 0.0–0.1)
Eosinophils Relative: 4 % (ref 0–5)
Lymphocytes Relative: 35 % (ref 12–46)
MCV: 84.4 fL (ref 78.0–100.0)
Platelets: 226 10*3/uL (ref 150–400)
RDW: 14.5 % (ref 11.5–15.5)
WBC: 6.5 10*3/uL (ref 4.0–10.5)

## 2012-07-24 LAB — COMPREHENSIVE METABOLIC PANEL
BUN: 11 mg/dL (ref 6–23)
CO2: 24 mEq/L (ref 19–32)
Creat: 0.45 mg/dL — ABNORMAL LOW (ref 0.50–1.10)
Glucose, Bld: 94 mg/dL (ref 70–99)
Total Bilirubin: 0.3 mg/dL (ref 0.3–1.2)

## 2012-07-24 LAB — LIPID PANEL: HDL: 48 mg/dL (ref >39)

## 2012-07-24 LAB — TSH: TSH: 0.909 u[IU]/mL (ref 0.350–4.500)

## 2012-07-25 LAB — VITAMIN D 25 HYDROXY (VIT D DEFICIENCY, FRACTURES): Vit D, 25-Hydroxy: 52 ng/mL (ref 30–89)

## 2012-07-28 LAB — FOLATE: Folate: >20 ng/mL

## 2012-07-28 LAB — VITAMIN B12: Vitamin B-12: 526 pg/mL (ref 211–911)

## 2012-07-29 ENCOUNTER — Encounter: Payer: Self-pay | Admitting: Family Medicine

## 2012-07-29 NOTE — Progress Notes (Signed)
Quick Note:  Gave to jo ______

## 2012-07-30 ENCOUNTER — Encounter: Payer: Self-pay | Admitting: Family Medicine

## 2012-07-30 DIAGNOSIS — D509 Iron deficiency anemia, unspecified: Secondary | ICD-10-CM | POA: Insufficient documentation

## 2012-07-31 ENCOUNTER — Other Ambulatory Visit: Payer: Self-pay | Admitting: *Deleted

## 2012-07-31 ENCOUNTER — Ambulatory Visit
Admission: RE | Admit: 2012-07-31 | Discharge: 2012-07-31 | Disposition: A | Discharge status: Home / Self Care | Payer: 59 | Source: Ambulatory Visit | Attending: Family Medicine | Admitting: Family Medicine

## 2012-07-31 DIAGNOSIS — D509 Iron deficiency anemia, unspecified: Secondary | ICD-10-CM

## 2012-07-31 DIAGNOSIS — Z1231 Encounter for screening mammogram for malignant neoplasm of breast: Secondary | ICD-10-CM

## 2012-09-06 ENCOUNTER — Other Ambulatory Visit: Payer: Self-pay | Admitting: Family Medicine

## 2012-09-18 ENCOUNTER — Telehealth: Payer: Self-pay | Admitting: Family Medicine

## 2012-09-18 DIAGNOSIS — G4700 Insomnia, unspecified: Secondary | ICD-10-CM

## 2012-09-18 MED ORDER — ZOLPIDEM TARTRATE ER 12.5 MG PO TBCR: 12.5000 mg | EXTENDED_RELEASE_TABLET | Freq: Every evening | ORAL | Status: DC | PRN

## 2012-09-18 NOTE — Telephone Encounter (Signed)
Needs refill on Ambien  [facility]

## 2012-09-18 NOTE — Telephone Encounter (Signed)
I'm assuming she means Ambien CR 12.5.  Okay for #90 to [facility]

## 2012-09-18 NOTE — Telephone Encounter (Signed)
Phoned in.

## 2012-11-03 ENCOUNTER — Other Ambulatory Visit: Payer: 59

## 2012-11-03 ENCOUNTER — Other Ambulatory Visit: Payer: Self-pay | Admitting: Family Medicine

## 2012-11-03 DIAGNOSIS — D509 Iron deficiency anemia, unspecified: Secondary | ICD-10-CM

## 2012-11-03 LAB — CBC WITH DIFFERENTIAL/PLATELET
Basophils Absolute: 0 10*3/uL (ref 0.0–0.1)
Basophils Relative: 0 % (ref 0–1)
Eosinophils Absolute: 0.2 10*3/uL (ref 0.0–0.7)
Hemoglobin: 12.9 g/dL (ref 12.0–15.0)
MCH: 28.6 pg (ref 26.0–34.0)
MCHC: 33.5 g/dL (ref 30.0–36.0)
Monocytes Relative: 10 % (ref 3–12)
Neutro Abs: 3.2 10*3/uL (ref 1.7–7.7)
Neutrophils Relative %: 48 % (ref 43–77)
RDW: 14.8 % (ref 11.5–15.5)

## 2012-11-04 LAB — FERRITIN: Ferritin: 11 ng/mL (ref 10–291)

## 2012-11-14 ENCOUNTER — Telehealth: Payer: Self-pay | Admitting: Family Medicine

## 2012-11-14 DIAGNOSIS — G4700 Insomnia, unspecified: Secondary | ICD-10-CM

## 2012-11-14 NOTE — Telephone Encounter (Signed)
90 pills were given May 1. It is too soon to get a refill.

## 2012-11-18 ENCOUNTER — Encounter: Payer: Self-pay | Admitting: Medical

## 2012-11-18 ENCOUNTER — Ambulatory Visit (INDEPENDENT_AMBULATORY_CARE_PROVIDER_SITE_OTHER): Payer: 59 | Admitting: Medical

## 2012-11-18 VITALS — BP 128/82 | HR 68 | Temp 98.1°F | Resp 16 | Wt 147.0 lb

## 2012-11-18 DIAGNOSIS — J329 Chronic sinusitis, unspecified: Secondary | ICD-10-CM

## 2012-11-18 MED ORDER — AZITHROMYCIN 250 MG PO TABS: ORAL_TABLET | ORAL | Status: DC

## 2012-11-18 NOTE — Patient Instructions (Signed)

## 2012-11-18 NOTE — Progress Notes (Signed)
Subjective:  Julie Fischer is a 52 y.o. female who presents for 1 wk hx/o illness.  She reports cough that began a week ago.  Has developed sore throat, sinus headache over the week.  Sore throat from coughing so much.  She notes subjective fever, pressure in sinuses, headache around eyes, sense of fullness in ears, feels some teeth pain.  No NVD, no runny nose.  Past history is significant for prior sinus infections, hx/o right TM tube due to prior OM.  Has done fine on Zpak prior, PCN allergic.  Patient is a non-smoker.  Using Tylneol and Sudafed for symptoms.  Denies sick contacts.  No other aggravating or relieving factors.  No other c/o.  Past Medical History  Diagnosis Date  . Breast cancer R breast(lumpectomy,chemo,radiation,tamoxifen) Dr.Rubin  . Hypertension resolved after weight loss surgery  . Diabetes mellitus resolved after weightr loss surgery  . Microalbuminuria h/o  . Iron deficiency h/o  . Depression   . Anxiety   . Sleep apnea   . Osteopenia (monitored by Dr Donnie Coffin)  . Plantar fasciitis (07') DrRegal    s/p surgical release 01/2011  . Vitamin D deficiency   . OA (osteoarthritis) of knee   . Insomnia chronic  . Allergy     fall seasonal  . Blood transfusion     Platlet transfusion when on heparin  . Hyperlipidemia    ROS as in subjective   Objective: Filed Vitals:   11/18/12 1337  BP: 128/82  Pulse: 68  Temp: 98.1 F (36.7 C)  Resp: 16    General appearance: Alert, WD/WN, no distress                             Skin: warm, no rash                           Head: +frontal and maxillary sinus tenderness,                            Eyes: conjunctiva normal, corneas clear, PERRLA                            Ears: blue TM tube in place in right TM, left TM pearly, external ear canals normal                          Nose: septum midline, turbinates swollen, no discharge             Mouth/throat: MMM, tongue normal, mild pharyngeal erythema       Neck: supple, no adenopathy, no thyromegaly, nontender                          Heart: RRR, normal S1, S2, no murmurs                         Lungs: CTA bilaterally, no wheezes, rales, or rhonchi      Assessment and Plan:   Encounter Diagnosis  Name Primary?  . Sinusitis Yes    Prescription given for Zpak.  Can use OTC Mucinex or Coricidin HBP for congestion.  Advised against sudafed or similar due to BP affects.   Can use Tylenol OTC for fever and  malaise.  Discussed symptomatic relief, nasal saline, and call or return if worse or not improving in 2-3 days.  Note given for work.

## 2012-11-23 ENCOUNTER — Other Ambulatory Visit: Payer: Self-pay | Admitting: Family Medicine

## 2012-11-24 ENCOUNTER — Telehealth: Payer: Self-pay | Admitting: Internal Medicine

## 2012-11-24 NOTE — Telephone Encounter (Signed)
Pt states she needs her Remus Loffler called in for a 10day supply to [facility] [address] and then [place] [facility] for [facility] 90 day supplies. Pt is out and she thought you were going to send in [place] the other day when she was in

## 2012-11-25 ENCOUNTER — Other Ambulatory Visit: Payer: Self-pay | Admitting: Family Medicine

## 2012-11-25 ENCOUNTER — Telehealth: Payer: Self-pay | Admitting: Family Medicine

## 2012-11-25 NOTE — Telephone Encounter (Signed)
Here is the note

## 2012-11-25 NOTE — Telephone Encounter (Signed)
Looking back through chart notes, Ambien last sent 5/1 for 90 day supply.  Thus, she should have enough until 8/1.  I can't refill this early.  I don't recall [place] discussing last visit for sinus infection, and there is another msg regarding this in recent phone records.

## 2012-11-25 NOTE — Telephone Encounter (Signed)
See note from 11/25/2012

## 2012-11-25 NOTE — Telephone Encounter (Signed)
Pt called wanting ambien refill. RX was written for number 90 on may 1st. Pt was informed that she should have meds left. Pt then brought bottle in that states she received number 60 not 90. [facility] then [facility] to inquire as to why correct amount was not filled. They stated that her insurance would only fill 60 at a time. The additional 30 pills where called into a local pharmacy. [facility] Pt did voice concerns that she had to make several phone calls to get this resolved and records indicate that she made several calls and was not called back.

## 2012-12-22 ENCOUNTER — Telehealth: Payer: Self-pay | Admitting: Family Medicine

## 2012-12-22 DIAGNOSIS — G4700 Insomnia, unspecified: Secondary | ICD-10-CM

## 2012-12-22 MED ORDER — ZOLPIDEM TARTRATE ER 12.5 MG PO TBCR: 12.5000 mg | EXTENDED_RELEASE_TABLET | Freq: Every evening | ORAL | Status: DC | PRN

## 2012-12-22 NOTE — Telephone Encounter (Signed)
Pt needs refill on ambien. This rx can only be written for 60 days not 90 as most mail orders. He insurance will only cover 60 at a time. Please note previous messages for this pt. [facility]

## 2012-12-22 NOTE — Telephone Encounter (Signed)
Faxed rx for #60 to [facility].

## 2012-12-22 NOTE — Telephone Encounter (Signed)
Previous messages were never routed to me.  They were now reviewed.  Please sent #60 to [facility]

## 2013-01-21 ENCOUNTER — Encounter: Payer: Self-pay | Admitting: Family Medicine

## 2013-01-28 ENCOUNTER — Ambulatory Visit (INDEPENDENT_AMBULATORY_CARE_PROVIDER_SITE_OTHER): Payer: 59 | Admitting: Family Medicine

## 2013-01-28 ENCOUNTER — Encounter: Payer: Self-pay | Admitting: Family Medicine

## 2013-01-28 VITALS — BP 120/76 | HR 60 | Ht <= 58 in | Wt 149.0 lb

## 2013-01-28 DIAGNOSIS — R7301 Impaired fasting glucose: Secondary | ICD-10-CM

## 2013-01-28 DIAGNOSIS — C50911 Malignant neoplasm of unspecified site of right female breast: Secondary | ICD-10-CM

## 2013-01-28 DIAGNOSIS — Z853 Personal history of malignant neoplasm of breast: Secondary | ICD-10-CM

## 2013-01-28 DIAGNOSIS — E7800 Pure hypercholesterolemia, unspecified: Secondary | ICD-10-CM

## 2013-01-28 DIAGNOSIS — M810 Age-related osteoporosis without current pathological fracture: Secondary | ICD-10-CM

## 2013-01-28 DIAGNOSIS — C50919 Malignant neoplasm of unspecified site of unspecified female breast: Secondary | ICD-10-CM

## 2013-01-28 DIAGNOSIS — J309 Allergic rhinitis, unspecified: Secondary | ICD-10-CM

## 2013-01-28 DIAGNOSIS — Z0000 Encounter for general adult medical examination without abnormal findings: Secondary | ICD-10-CM

## 2013-01-28 DIAGNOSIS — G4700 Insomnia, unspecified: Secondary | ICD-10-CM

## 2013-01-28 DIAGNOSIS — I10 Essential (primary) hypertension: Secondary | ICD-10-CM

## 2013-01-28 DIAGNOSIS — Z23 Encounter for immunization: Secondary | ICD-10-CM

## 2013-01-28 DIAGNOSIS — F3289 Other specified depressive episodes: Secondary | ICD-10-CM

## 2013-01-28 DIAGNOSIS — F329 Major depressive disorder, single episode, unspecified: Secondary | ICD-10-CM

## 2013-01-28 DIAGNOSIS — M899 Disorder of bone, unspecified: Secondary | ICD-10-CM

## 2013-01-28 DIAGNOSIS — M8580 Other specified disorders of bone density and structure, unspecified site: Secondary | ICD-10-CM

## 2013-01-28 LAB — COMPREHENSIVE METABOLIC PANEL
ALT: 25 U/L (ref 0–35)
Albumin: 4.5 g/dL (ref 3.5–5.2)
CO2: 28 mEq/L (ref 19–32)
Calcium: 9.5 mg/dL (ref 8.4–10.5)
Chloride: 102 mEq/L (ref 96–112)
Potassium: 3.7 mEq/L (ref 3.5–5.3)
Sodium: 137 mEq/L (ref 135–145)
Total Protein: 6.6 g/dL (ref 6.0–8.3)

## 2013-01-28 LAB — POCT URINALYSIS DIPSTICK
Bilirubin, UA: NEGATIVE
Blood, UA: NEGATIVE
Glucose, UA: NEGATIVE
Ketones, UA: NEGATIVE
Spec Grav, UA: 1.015
Urobilinogen, UA: NEGATIVE

## 2013-01-28 LAB — LIPID PANEL: Cholesterol: 150 mg/dL (ref 0–200)

## 2013-01-28 MED ORDER — FLUTICASONE PROPIONATE 50 MCG/ACT NA SUSP: 2.0000 | Freq: Every day | NASAL | Status: DC

## 2013-01-28 MED ORDER — PAROXETINE HCL 40 MG PO TABS: ORAL_TABLET | ORAL | Status: DC

## 2013-01-28 MED ORDER — LISINOPRIL 10 MG PO TABS: ORAL_TABLET | ORAL | Status: DC

## 2013-01-28 MED ORDER — SIMVASTATIN 20 MG PO TABS: ORAL_TABLET | ORAL | Status: DC

## 2013-01-28 MED ORDER — ALENDRONATE SODIUM 70 MG PO TABS: 70.0000 mg | ORAL_TABLET | ORAL | Status: DC

## 2013-01-28 NOTE — Patient Instructions (Signed)
HEALTH MAINTENANCE RECOMMENDATIONS:  It is recommended that you get at least 30 minutes of aerobic exercise at least 5 days/week (for weight loss, you may need as much as 60-90 minutes). This can be any activity that gets your heart rate up. This can be divided in 10-15 minute intervals if needed, but try and build up your endurance at least once a week.  Weight bearing exercise is also recommended twice weekly.  Eat a healthy diet with lots of vegetables, fruits and fiber.  "Colorful" foods have a lot of vitamins (ie green vegetables, tomatoes, red peppers, etc).  Limit sweet tea, regular sodas and alcoholic beverages, all of which has a lot of calories and sugar.  Up to 1 alcoholic drink daily may be beneficial for women (unless trying to lose weight, watch sugars).  Drink a lot of water.  Calcium recommendations are 1200-1500 mg daily (1500 mg for postmenopausal women or women without ovaries), and vitamin D 1000 IU daily.  This should be obtained from diet and/or supplements (vitamins), and calcium should not be taken all at once, but in divided doses.  Monthly self breast exams and yearly mammograms for women over the age of 66 is recommended.  Sunscreen of at least SPF 30 should be used on all sun-exposed parts of the skin when outside between the hours of 10 am and 4 pm (not just when at beach or pool, but even with exercise, golf, tennis, and yard work!)  Use a sunscreen that says "broad spectrum" so it covers both UVA and UVB rays, and make sure to reapply every 1-2 hours.  Remember to change the batteries in your smoke detectors when changing your clock times in the spring and fall.  Use your seat belt every time you are in a car, and please drive safely and not be distracted with cell phones and texting while driving.  Restart the weekly alendronate. Schedule your bone density. Increase your exercise, and cut back on portions, eat healthy diet--really try and lose a little weight Add  weight-bearing exercise to upper extremities twice a week.

## 2013-01-28 NOTE — Progress Notes (Signed)
Chief Complaint  Patient presents with  . Annual Exam    fasting annual exam, did have breakfast @ 8:20am. Needs pelvic. Is having some issues with ther sinuses-her teeth are aching by the end of the day.    Julie Fischer is a 52 y.o. female who presents for a complete physical.  She also presents for 6 month med check.  Allergies have started flaring over the last month.  She is complaining of some congestion and sinus pressure; describingpain in her upper teeth that is worse at the end of the day.  She denies any purulence or fevers. She takes tylenol as needed for the pain.  She takes an OTC allergy medication daily.  Some postnasal drainage.  Denies chest congestion, cough, shortness of breath.  She previously used flonase with good results.  Hypertension follow-up:  Blood pressures elsewhere are 120-130/70-85.  Denies dizziness, headaches, chest pain.  Denies side effects of medications, no cough.  Depression:  Moods seem to be well controlled on the current dose.  Some days feels more down than others, but overall doing okay.  Denies SI/HI.  Denies side effects of medications.  Hyperlipidemia follow-up: Patient is reportedly following a low-fat, low cholesterol diet. Compliant with medications and denies medication side effects.   Osteoporosis: Previously took fosamax without problems. Denies dysphagia or chest pain. DEXA's previously ordered by oncologist, but she is no longer seeing them. She stopped taking the medication about 6-8 months ago, because she got out of the habit.  Didn't have any side effects develop.   Insomnia: requires Ambien CR nightly. Denies side effects or strange behaviors. Insurance changed to where it only covers 60 pills/90 days.  Has been paying out of pocket for other days since the change (required local 30 day rx, whereas the #60 was going to mail order pharmacy).  H/o DM, improved after weight loss (surgical).  She denies polydipsia, polyuria, vision  changes.  Breast cancer:  She elected not to follow up with oncologist after Dr. Donnie Coffin left.  Her cancer diagnosis was 13 years ago, hasn't had any problems (other than osteoporosis), and still does regular breast exams, and yearly mammograms.   Immunization History  Administered Date(s) Administered  . Influenza Split 02/07/2009, 04/10/2010, 04/19/2011, 01/28/2012  . Influenza,inj,Quad PF,36+ Mos 01/28/2013  . Pneumococcal Polysaccharide 06/04/2001, 11/15/2010  . Td 06/04/2001  . Tdap 11/15/2010   Last Pap smear: s/p hysterectomy  Last mammogram: 07/2012 Last colonoscopy: 12/2010  Last DEXA: through oncologist, 11/2010.  T-2.7 (osteoporosis) Ophtho: yearly,  Last in 04/2012 Dentist: twice yearly  Exercise: walks 2-3x per week    ROS: The patient denies anorexia, fever, weight changes, headaches, vision changes, decreased hearing, ear pain, sore throat, breast concerns, chest pain, palpitations, dizziness, syncope, dyspnea on exertion, cough, swelling, nausea, vomiting, diarrhea, constipation, abdominal pain, melena, hematochezia, indigestion/heartburn, hematuria, incontinence, dysuria, vaginal bleeding, discharge, odor or itch, genital lesions, joint pains, numbness, tingling, weakness, tremor, suspicious skin lesions, depression, anxiety, abnormal bleeding/bruising, or enlarged lymph nodes.    PHYSICAL EXAM: BP 120/76  Pulse 60  Ht 4\' 9"  (1.448 m)  Wt 149 lb (67.586 kg)  BMI 32.23 kg/m2  General Appearance:  Alert, cooperative, no distress, appears stated age   Head:  Normocephalic, without obvious abnormality, atraumatic   Eyes:  PERRL, conjunctiva/corneas clear, EOM's intact, fundi  benign   Ears:  Normal TM's and external ear canals. Blue PE tube noted in right ear  Nose:  Nasal mucosa mildly edematous with clear mucus.  Sinuses nontender  Throat:  Lips, mucosa, and tongue normal; teeth and gums normal   Neck:  Supple, no lymphadenopathy; thyroid: no  enlargement/tenderness/nodules; no carotid  bruit or JVD   Back:  Spine nontender, no curvature, ROM normal, no CVA tenderness   Lungs:  Clear to auscultation bilaterally without wheezes, rales or ronchi; respirations unlabored   Chest Wall:  No tenderness or deformity   Heart:  Regular rate and rhythm, S1 and S2 normal, no murmur, rub  or gallop   Breast Exam:  R breast--s/p surgery with scarring and some skin changes related to previous radiation. No focal mass. No tenderness, nipple discharge or inversion. L breast exam is entirely normal.  No axillary lymphadenopathy   Abdomen:  Soft, nondistended, normoactive bowel sounds,  no masses, no hepatosplenomegaly   Genitalia:  Normal external genitalia without lesions. BUS and vagina normal; Bimanual exam revealed surgically absent uterus. No adnexal masses. No mass, rebound tenderness or guarding   Rectal:  Normal tone, no masses or tenderness; guaiac negative stool   Extremities:  No clubbing, cyanosis or edema.   Pulses:  2+ and symmetric all extremities   Skin:  Skin color, texture, turgor normal, no rashes or lesions   Lymph nodes:  Cervical, supraclavicular, and axillary nodes normal   Neurologic:  CNII-XII intact, normal strength, sensation and gait; reflexes 2+ and symmetric throughout          Psych: Normal mood, affect, hygiene and grooming.   ASSESSMENT/PLAN:  Routine general medical examination at a health care facility - Plan: POCT Urinalysis Dipstick, Visual acuity screening  Need for prophylactic vaccination and inoculation against influenza - Plan: Flu Vaccine QUAD 36+ mos IM  Pure hypercholesterolemia - Plan: Comprehensive metabolic panel, Lipid panel, simvastatin (ZOCOR) 20 MG tablet  Insomnia - well controlled with Ambien CR.  Insurance only covering 60/90 days.  discussed OTC meds to try on other days, vs cash-pay  Impaired fasting glucose - formerly diabetic (prior to weight loss).  encouraged daily exercise and weight  loss - Plan: Hemoglobin A1c  Essential hypertension, benign - well controlled.  encouraged increased exercise and weight loss.  continue current medication - Plan: Comprehensive metabolic panel, lisinopril (PRINIVIL,ZESTRIL) 10 MG tablet  Depressive disorder, not elsewhere classified - Stable.  continue current medication - Plan: PARoxetine (PAXIL) 40 MG tablet  Osteopenia due to cancer therapy - Plan: alendronate (FOSAMAX) 70 MG tablet  Breast cancer, right - Plan: Comprehensive metabolic panel, alendronate (FOSAMAX) 70 MG tablet  Osteoporosis - Due for f/u DEXA.  restart alendronate weekly.  If she prefers to change to a monthly dosed medication (Boniva, Actonel) she will let us know - Plan: alendronate (FOSAMAX) 70 MG tablet, DG Bone Density  Allergic rhinitis, cause unspecified - restart Flonase.  continue antihistamines.  use sinus rinses prn - Plan: fluticasone (FLONASE) 50 MCG/ACT nasal spray  History of breast cancer  Discussed monthly self breast exams and yearly mammograms; at least 30 minutes of aerobic activity at least 5 days/week; proper sunscreen use reviewed; healthy diet, including goals of calcium and vitamin D intake and alcohol recommendations (less than or equal to 1 drink/day) reviewed; regular seatbelt use; changing batteries in smoke detectors. Immunization recommendations discussed--UTD, flu shot given. Colonoscopy recommendations reviewed, UTD --due again in 2017 (5 year f/u) due to family h/o colon cancer (mother).

## 2013-01-29 ENCOUNTER — Encounter: Payer: Self-pay | Admitting: Family Medicine

## 2013-01-29 LAB — HEMOGLOBIN A1C
Hgb A1c MFr Bld: 5.6 % (ref <5.7)
Mean Plasma Glucose: 114 mg/dL (ref <117)

## 2013-02-12 ENCOUNTER — Encounter: Payer: Self-pay | Admitting: Family Medicine

## 2013-02-12 ENCOUNTER — Ambulatory Visit (INDEPENDENT_AMBULATORY_CARE_PROVIDER_SITE_OTHER): Payer: 59 | Admitting: Family Medicine

## 2013-02-12 VITALS — BP 122/74 | HR 60 | Wt 147.0 lb

## 2013-02-12 DIAGNOSIS — M549 Dorsalgia, unspecified: Secondary | ICD-10-CM

## 2013-02-12 NOTE — Patient Instructions (Addendum)
Heat to back for 20 minutes 3 times per day. Stretching after you do the heat. 4 Advil 3 times per day. Lift properly meaning keep your back straight. As for the next 7-10 days but she get worse come back

## 2013-02-12 NOTE — Progress Notes (Signed)
  Subjective:    Patient ID: Julie Fischer, female    DOB: 1960-07-06, 52 y.o.   MRN: [id number]  HPI 4 Days ago she noted the onset of back pain no history of injury. It has been slowly getting worse specially with activity. No numbness, tingling or weakness. She has been using Bayer aspirin. No urinary symptoms. No previous history of back pain     Review of Systems     Objective:   Physical Exam Slight tenderness palpation over the mid sacral area. SI joints were essentially negative. Normal lumbar motion. Normal hip motion. DTRs normal. Straight leg raising negative.       Assessment & Plan:  Back pain  recommend conservative care with heat, stretching, NSAID. Call if continued difficulty.

## 2013-02-16 ENCOUNTER — Ambulatory Visit (INDEPENDENT_AMBULATORY_CARE_PROVIDER_SITE_OTHER): Payer: 59 | Admitting: Family Medicine

## 2013-02-16 ENCOUNTER — Other Ambulatory Visit: Payer: Self-pay | Admitting: Family Medicine

## 2013-02-16 ENCOUNTER — Encounter: Payer: Self-pay | Admitting: Family Medicine

## 2013-02-16 ENCOUNTER — Ambulatory Visit
Admission: RE | Admit: 2013-02-16 | Discharge: 2013-02-16 | Disposition: A | Discharge status: Home / Self Care | Payer: 59 | Source: Ambulatory Visit | Attending: Family Medicine | Admitting: Family Medicine

## 2013-02-16 VITALS — BP 128/68 | HR 68 | Ht <= 58 in | Wt 149.0 lb

## 2013-02-16 DIAGNOSIS — M545 Low back pain: Secondary | ICD-10-CM

## 2013-02-16 DIAGNOSIS — M546 Pain in thoracic spine: Secondary | ICD-10-CM

## 2013-02-16 DIAGNOSIS — M62838 Other muscle spasm: Secondary | ICD-10-CM

## 2013-02-16 LAB — POCT URINALYSIS DIPSTICK
Bilirubin, UA: NEGATIVE
Ketones, UA: NEGATIVE
Leukocytes, UA: NEGATIVE
pH, UA: 5

## 2013-02-16 MED ORDER — CYCLOBENZAPRINE HCL 10 MG PO TABS: 5.0000 mg | ORAL_TABLET | Freq: Three times a day (TID) | ORAL | Status: DC | PRN

## 2013-02-16 NOTE — Progress Notes (Signed)
Chief Complaint  Patient presents with  . Back Pain    has been having some lbp that is on both sides x 1.5 weeks, saw Dr.Lalonde 02/12/13 he told her to take 800 mg IBU q 8 hrs and heat QID x 20 minutes and stretching afterward, not any better-actually worse. Also makes mention of a "knot" near her left shoulder blade that has been there x several years, pain is radiating down her arm-wonders if they are related?   Patient has been having LBP since 9/22. She saw Dr. Susann Givens on 9/25, was found to be tender over her sacrum. She has been using ibuprofen 800mg  three times daily and using heat, but pain has gotten worse.  Pain is now spreading up the back, on both sides.  Denies radiation into her legs.  Denies numbness, weakness or fevers.  She had some tingling in left arm yesterday--she has a knot in her upper shoulder, and felt numbness radiate into the left arm/hand while riding in the car. Yesterday she felt a little weak in that arm, but feels normal today.  Denies any known injury or change in activity; denies falls.  Past Medical History  Diagnosis Date  . Breast cancer 2001    R breast(lumpectomy,chemo,radiation,tamoxifen)  . Hypertension     resolved after weight loss surgery; recurred 2013  . Diabetes mellitus     resolved after weight loss surgery  . Microalbuminuria     h/o  . Iron deficiency     h/o  . Depression   . Anxiety   . Sleep apnea     resolved after weight loss surgery  . Osteoporosis   . Plantar fasciitis (07') DrRegal    s/p surgical release 01/2011  . Vitamin D deficiency   . OA (osteoarthritis) of knee   . Insomnia chronic  . Allergy     fall seasonal  . Blood transfusion     Platelet transfusion when on heparin  . Hyperlipidemia    Past Surgical History  Procedure Laterality Date  . Breast lumpectomy Right  11/1999  . Abdominal hysterectomy  2000    and RSO(endometriosis)  . Appendectomy    . Cuboid stress fracture Right 12/2002  . Left shoulder  fracture repair  (DrMortensen) 12/05  . 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 Myringotomy      tubes B/L, multiple sets  . Foot surgery Left 2008  . Colonoscopy  2002, 2012  . Tonsillectomy and adenoidectomy    . Plantar fasciitis release Right 01/30/11    Dr. Charlsie Merles   History   Social History  . Marital Status: Married    Spouse Name: N/A    Number of Children: N/A  . Years of Education: N/A   Occupational History  . Not on file.   Social History Main Topics  . Smoking status: Former Smoker    Quit date: 01/28/1996  . Smokeless tobacco: Never Used  . Alcohol Use: Yes     Comment: glass of wine or beer maybe once a month.  . Drug Use: No  . Sexual Activity: Not Currently    Partners: Male   Other Topics Concern  . Not on file   Social History Narrative   Works at [employer], as a copy and Chartered loss adjuster.  Lives with husband, 3 dogs, 1 cat, 4 fish   Current Outpatient Prescriptions on File Prior to Visit  Medication Sig Dispense Refill  . alendronate (FOSAMAX) 70 MG tablet Take 1 tablet (70  mg total) by mouth every 7 (seven) days. Take with a full glass of water on an empty stomach.  12 tablet  3  . Calcium Carbonate-Vitamin D (CALTRATE 600+D) 600-400 MG-UNIT per tablet Take 1 tablet by mouth 2 (two) times daily.       . cholecalciferol (VITAMIN D) 1000 UNITS tablet Take 1,000 Units by mouth daily.        . fluticasone (FLONASE) 50 MCG/ACT nasal spray Place 2 sprays into the nose daily.  16 g  6  . lisinopril (PRINIVIL,ZESTRIL) 10 MG tablet TAKE 1 TABLET DAILY  90 tablet  3  . Lysine 1000 MG TABS Take 1 tablet by mouth daily as needed. Cold sores      . Multiple Vitamins-Minerals (MULTIVITAMIN WITH MINERALS) tablet Take 1 tablet by mouth daily.        . Omega-3 Fatty Acids (FISH OIL) 1200 MG CAPS Take 1 capsule by mouth daily. 2 nightly      . PARoxetine (PAXIL) 40 MG tablet TAKE 1 TABLET EVERY MORNING  90 tablet  3  . simvastatin (ZOCOR) 20 MG tablet TAKE 1 TABLET AT  BEDTIME  90 tablet  1  . vitamin C (ASCORBIC ACID) 500 MG tablet Take 500 mg by mouth daily.        . vitamin E 400 UNIT capsule Take 400 Units by mouth daily.        Marland Kitchen zolpidem (AMBIEN CR) 12.5 MG CR tablet Take 1 tablet (12.5 mg total) by mouth at bedtime as needed.  60 tablet  0  . simethicone (MYLICON) 80 MG chewable tablet Chew 80 mg by mouth as needed.        . valACYclovir (VALTREX) 1000 MG tablet Take 2 tablets at onset of cold sore, and repeat in 12 hours (4 tablets per episode of cold sore)  12 tablet  0   No current facility-administered medications on file prior to visit.   Allergies  Allergen Reactions  . Heparin Other (See Comments)    Low platelets  . Niacin And Related Other (See Comments)    Blotchy/itchy  . Penicillins Rash   ROS:  Denies fevers, URI symptoms, chest pain, shortness of breath, numbness/tingling/weakness except as noted on HPI  Denies GI complaints, urinary complaints or other concerns.  PHYSICAL EXAM: BP 128/68  Pulse 68  Ht 4\' 9"  (1.448 m)  Wt 149 lb (67.586 kg)  BMI 32.23 kg/m2 Pleasant female in no distress  Spine: c-spine nontender Tender at mid-thoracic spine (above level of her bra, extending up to top of scapula), and at left rhomboid muscles Tender at entire lower lumbar spine, down through sacrum, and R SI joint.   No tenderness at sciatic notch Strength 5/5, normal sensation.  DTR's 2+ and symmetric Negative straight leg raise.  ASSESSMENT/PLAN: LBP (low back pain) - Plan: POCT Urinalysis Dipstick, DG Lumbar Spine Complete  Thoracic back pain - Plan: CANCELED: DG Thoracic Spine 4V  Muscle spasm - Plan: cyclobenzaprine (FLEXERIL) 10 MG tablet   Thoracic and lumbosacral back pain, with tenderness at spine and R SI joint, and some spasm of romboid muscles. Check x-rays given h/o cancer  Continue NSAID's as needed for pain, along with tylenol as needed. She has previously taken flexeril--will rx to use as needed at  bedtime Stretches as shown.  Call for referral to PT if not improving with these measures in the next week.

## 2013-02-16 NOTE — Patient Instructions (Signed)
Go to [facility] for x-rays. Continue ibuprofen as needed for pain, along with tylenol as needed. Continue heat, massage, and stretches as shown (for the muscles near your shoulder blade). Use the muscle relaxants at bedtime--likely can't use during the day due to sedation.  Call us within a week or two if you aren't improving, for referral to Physical Therapy

## 2013-02-27 ENCOUNTER — Telehealth: Payer: Self-pay | Admitting: Family Medicine

## 2013-02-27 DIAGNOSIS — G4700 Insomnia, unspecified: Secondary | ICD-10-CM

## 2013-02-27 MED ORDER — ZOLPIDEM TARTRATE ER 12.5 MG PO TBCR: 12.5000 mg | EXTENDED_RELEASE_TABLET | Freq: Every evening | ORAL | Status: DC | PRN

## 2013-02-27 NOTE — Telephone Encounter (Signed)
This was sent to me, is this okay to phone in?

## 2013-02-27 NOTE — Telephone Encounter (Signed)
Pt called requesting a refill on her Ambien CR 12.5 mg # 60 called into [facility] on [address]  Please notify pt when ready.

## 2013-02-27 NOTE — Telephone Encounter (Signed)
Phoned in 60 with 0 refills

## 2013-02-27 NOTE — Telephone Encounter (Signed)
Ok to call in #60, no refill

## 2013-04-07 ENCOUNTER — Encounter: Payer: Self-pay | Admitting: Medical

## 2013-04-07 ENCOUNTER — Ambulatory Visit (INDEPENDENT_AMBULATORY_CARE_PROVIDER_SITE_OTHER): Payer: 59 | Admitting: Medical

## 2013-04-07 VITALS — BP 140/90 | HR 63 | Temp 98.1°F | Resp 18 | Wt 149.0 lb

## 2013-04-07 DIAGNOSIS — R05 Cough: Secondary | ICD-10-CM

## 2013-04-07 DIAGNOSIS — R0602 Shortness of breath: Secondary | ICD-10-CM

## 2013-04-07 DIAGNOSIS — L255 Unspecified contact dermatitis due to plants, except food: Secondary | ICD-10-CM

## 2013-04-07 DIAGNOSIS — L237 Allergic contact dermatitis due to plants, except food: Secondary | ICD-10-CM

## 2013-04-07 DIAGNOSIS — J069 Acute upper respiratory infection, unspecified: Secondary | ICD-10-CM

## 2013-04-07 MED ORDER — AZITHROMYCIN 250 MG PO TABS: ORAL_TABLET | ORAL | Status: DC

## 2013-04-07 MED ORDER — HYDROCODONE-HOMATROPINE 5-1.5 MG/5ML PO SYRP
5.0000 mL | ORAL_SOLUTION | Freq: Three times a day (TID) | ORAL | Status: DC | PRN
Start: 2013-04-07 — End: 2013-07-29

## 2013-04-07 MED ORDER — METHYLPREDNISOLONE SODIUM SUCC 125 MG IJ SOLR
125.0000 mg | Freq: Once | INTRAMUSCULAR | Status: AC
  Administered 2013-04-07: 125 mg via INTRAMUSCULAR

## 2013-04-07 NOTE — Progress Notes (Signed)
Subjective:  Julie Fischer is a 52 y.o. female who presents for illness.  symptoms include 9 day hx/o cough, nonstop, sometimes productive of green mucous, nasal drainage that is green, some sore throat, some SOB, some sinus pressure.   Denies fever, NVD, ear pain, sneezing.   Patient is a non-smoker.  Using Mucinex DM, Nyquil, Dayquil, and Coricidin for symptoms.  Denies sick contacts.  No other aggravating or relieving factors.  No other c/o.  ROS as in subjective  Past Medical History  Diagnosis Date  . Breast cancer 2001    R breast(lumpectomy,chemo,radiation,tamoxifen)  . Hypertension     resolved after weight loss surgery; recurred 2013  . Diabetes mellitus     resolved after weight loss surgery  . Microalbuminuria     h/o  . Iron deficiency     h/o  . Depression   . Anxiety   . Sleep apnea     resolved after weight loss surgery  . Osteoporosis   . Plantar fasciitis (07') DrRegal    s/p surgical release 01/2011  . Vitamin D deficiency   . OA (osteoarthritis) of knee   . Insomnia chronic  . Allergy     fall seasonal  . Blood transfusion     Platelet transfusion when on heparin  . Hyperlipidemia     Objective: Filed Vitals:   04/07/13 1032  BP: 140/90  Pulse: 63  Temp: 98.1 F (36.7 C)  Resp: 18    General appearance: Alert, WD/WN, no distress                             Skin: warm, no rash                           Head: no sinus tenderness                            Eyes: conjunctiva normal, corneas clear, PERRLA                            Ears: blue TM tube in right TM, flat TMs in general, no erythema, external ear canals normal                          Nose: septum midline, turbinates swollen, with erythema and mucoid discharge             Mouth/throat: MMM, tongue normal, mild pharyngeal erythema                           Neck: supple, no adenopathy, no thyromegaly, nontender                          Heart: RRR, normal S1, S2, no murmurs            Lungs: CTA bilaterally, no wheezes, rales, or rhonchi      Assessment and Plan: URI SOB Cough  Prescription given for azithromycin, Hycodan syrup for cough.  Can use OTC Mucinex for congestion, but not all the different medications she is using simultaneously.  Tylenol for fever and malaise.  Discussed symptomatic relief, nasal saline flush, and call or return if worse or not improving in 2-3 days.

## 2013-04-29 ENCOUNTER — Telehealth: Payer: Self-pay | Admitting: Internal Medicine

## 2013-04-29 ENCOUNTER — Other Ambulatory Visit: Payer: Self-pay | Admitting: *Deleted

## 2013-04-29 DIAGNOSIS — G4700 Insomnia, unspecified: Secondary | ICD-10-CM

## 2013-04-29 MED ORDER — ZOLPIDEM TARTRATE ER 12.5 MG PO TBCR: 12.5000 mg | EXTENDED_RELEASE_TABLET | Freq: Every evening | ORAL | Status: DC | PRN

## 2013-04-29 NOTE — Telephone Encounter (Signed)
Refill request for Ambien 12.5 #[facility]

## 2013-04-29 NOTE — Telephone Encounter (Signed)
Ok to refill 

## 2013-04-29 NOTE — Telephone Encounter (Signed)
Done. Faxed rx.

## 2013-05-04 ENCOUNTER — Other Ambulatory Visit: Payer: Self-pay | Admitting: *Deleted

## 2013-05-04 ENCOUNTER — Telehealth: Payer: Self-pay | Admitting: Family Medicine

## 2013-05-04 DIAGNOSIS — G4700 Insomnia, unspecified: Secondary | ICD-10-CM

## 2013-05-04 MED ORDER — ZOLPIDEM TARTRATE ER 12.5 MG PO TBCR: 12.5000 mg | EXTENDED_RELEASE_TABLET | Freq: Every evening | ORAL | Status: DC | PRN

## 2013-05-04 NOTE — Telephone Encounter (Signed)
okay

## 2013-05-04 NOTE — Telephone Encounter (Signed)
Phoned in and notified patient.  

## 2013-05-25 ENCOUNTER — Encounter: Payer: 59 | Admitting: Family Medicine

## 2013-07-03 ENCOUNTER — Encounter: Payer: Self-pay | Admitting: Family Medicine

## 2013-07-03 ENCOUNTER — Ambulatory Visit (INDEPENDENT_AMBULATORY_CARE_PROVIDER_SITE_OTHER): Payer: 59 | Admitting: Family Medicine

## 2013-07-03 VITALS — BP 140/72 | HR 60 | Temp 98.2°F | Wt 149.0 lb

## 2013-07-03 DIAGNOSIS — J069 Acute upper respiratory infection, unspecified: Secondary | ICD-10-CM

## 2013-07-03 NOTE — Progress Notes (Signed)
   Subjective:    Patient ID: Julie Fischer, female    DOB: 08-04-1960, 53 y.o.   MRN: [id number]  HPI She has a two-day history of chest and nasal congestion or cough started last night. She also has fever and sore throat but no earache. Her husband has similar symptoms and is now getting better.   Review of Systems     Objective:   Physical Exam alert and in no distress. Tympanic membranes and canals are normal with a tube present in the right TM. Throat is clear. Tonsils are normal. Neck is supple without adenopathy or thyromegaly. Cardiac exam shows a regular sinus rhythm without murmurs or gallops. Lungs are clear to auscultation.        Assessment & Plan:  Acute URI  recommend supportive care.

## 2013-07-03 NOTE — Patient Instructions (Signed)
Upper Respiratory Infection, Adult An upper respiratory infection (URI) is also known as the common cold. It is often caused by a type of germ (virus). Colds are easily spread (contagious). You can pass it to others by kissing, coughing, sneezing, or drinking out of the same glass. Usually, you get better in 1 or 2 weeks.  HOME CARE   Only take medicine as told by your doctor.  Use a warm mist humidifier or breathe in steam from a hot shower.  Drink enough water and fluids to keep your pee (urine) clear or pale yellow.  Get plenty of rest.  Return to work when your temperature is back to normal or as told by your doctor. You may use a face mask and wash your hands to stop your cold from spreading. GET HELP RIGHT AWAY IF:   After the first few days, you feel you are getting worse.  You have questions about your medicine.  You have chills, shortness of breath, or brown or red spit (mucus).  You have yellow or brown snot (nasal discharge) or pain in the face, especially when you bend forward.  You have a fever, puffy (swollen) neck, pain when you swallow, or white spots in the back of your throat.  You have a bad headache, ear pain, sinus pain, or chest pain.  You have a high-pitched whistling sound when you breathe in and out (wheezing).  You have a lasting cough or cough up blood.  You have sore muscles or a stiff neck. MAKE SURE YOU:   Understand these instructions.  Will watch your condition.  Will get help right away if you are not doing well or get worse. Document Released: 10/24/2007 Document Revised: 07/30/2011 Document Reviewed: 09/11/2010 [facility] Patient Information 2014 [place], [place]. Afrin nasal spray at night only. Use whatever works for the rest your symptoms

## 2013-07-06 ENCOUNTER — Telehealth: Payer: Self-pay | Admitting: *Deleted

## 2013-07-06 ENCOUNTER — Other Ambulatory Visit: Payer: Self-pay | Admitting: *Deleted

## 2013-07-06 DIAGNOSIS — G4700 Insomnia, unspecified: Secondary | ICD-10-CM

## 2013-07-06 MED ORDER — BENZONATATE 100 MG PO CAPS: 100.0000 mg | ORAL_CAPSULE | Freq: Three times a day (TID) | ORAL | Status: DC | PRN

## 2013-07-06 MED ORDER — ZOLPIDEM TARTRATE ER 12.5 MG PO TBCR: 12.5000 mg | EXTENDED_RELEASE_TABLET | Freq: Every evening | ORAL | Status: DC | PRN

## 2013-07-06 NOTE — Telephone Encounter (Signed)
Patient called and asked for 30 day of Ambien be sent to pharmacy. Also she saw Dr.Lalonde last week, her cough is 10 times worse and she is not getting any sleep. Would like to know if you can phone something in or recommend something over the counter, nyquil is not working at all.

## 2013-07-06 NOTE — Telephone Encounter (Signed)
Done, and yes patient wanted #30.

## 2013-07-06 NOTE — Telephone Encounter (Signed)
[place] for zolpidem refill (looks like last rx was #60 on 04/29/13, is she only asking for #30 now?). Okay for tessalon 200mg  TID prn cough (see if she has taken/tolerated in past--shouldn't make her sedated, so can take during day.  Okay for #30, no refill).  She can also use Mucinex (plain or DM) to help with cough

## 2013-07-12 ENCOUNTER — Telehealth: Payer: Self-pay | Admitting: Family Medicine

## 2013-07-20 NOTE — Telephone Encounter (Signed)
Pt informed

## 2013-07-20 NOTE — Telephone Encounter (Signed)
P.A. ZOLPIDEM APPROVED TIL 07/13/14, FAXED PHARMACY

## 2013-07-29 ENCOUNTER — Encounter: Payer: Self-pay | Admitting: Family Medicine

## 2013-07-29 ENCOUNTER — Ambulatory Visit: Payer: 59 | Admitting: Family Medicine

## 2013-07-29 VITALS — BP 118/76 | HR 60 | Ht <= 58 in | Wt 150.0 lb

## 2013-07-29 DIAGNOSIS — E669 Obesity, unspecified: Secondary | ICD-10-CM

## 2013-07-29 DIAGNOSIS — I10 Essential (primary) hypertension: Secondary | ICD-10-CM

## 2013-07-29 DIAGNOSIS — Z9884 Bariatric surgery status: Secondary | ICD-10-CM

## 2013-07-29 DIAGNOSIS — B001 Herpesviral vesicular dermatitis: Secondary | ICD-10-CM

## 2013-07-29 DIAGNOSIS — M810 Age-related osteoporosis without current pathological fracture: Secondary | ICD-10-CM

## 2013-07-29 DIAGNOSIS — R7301 Impaired fasting glucose: Secondary | ICD-10-CM

## 2013-07-29 DIAGNOSIS — G4700 Insomnia, unspecified: Secondary | ICD-10-CM

## 2013-07-29 DIAGNOSIS — E7800 Pure hypercholesterolemia, unspecified: Secondary | ICD-10-CM

## 2013-07-29 DIAGNOSIS — D509 Iron deficiency anemia, unspecified: Secondary | ICD-10-CM

## 2013-07-29 LAB — HEPATIC FUNCTION PANEL
ALBUMIN: 4.6 g/dL (ref 3.5–5.2)
ALT: 27 U/L (ref 0–35)
AST: 23 U/L (ref 0–37)
Alkaline Phosphatase: 35 U/L — ABNORMAL LOW (ref 39–117)
BILIRUBIN INDIRECT: 0.3 mg/dL (ref 0.2–1.2)
Bilirubin, Direct: 0.1 mg/dL (ref 0.0–0.3)
TOTAL PROTEIN: 6.5 g/dL (ref 6.0–8.3)
Total Bilirubin: 0.4 mg/dL (ref 0.2–1.2)

## 2013-07-29 LAB — LIPID PANEL
Cholesterol: 155 mg/dL (ref 0–200)
HDL: 43 mg/dL (ref >39)
LDL Cholesterol: 92 mg/dL (ref 0–99)
TRIGLYCERIDES: 98 mg/dL (ref <150)
Total CHOL/HDL Ratio: 3.6 Ratio
VLDL: 20 mg/dL (ref 0–40)

## 2013-07-29 LAB — HEMOGLOBIN A1C
HEMOGLOBIN A1C: 6 % — AB (ref <5.7)
MEAN PLASMA GLUCOSE: 126 mg/dL — AB (ref <117)

## 2013-07-29 LAB — IRON: IRON: 123 ug/dL (ref 42–145)

## 2013-07-29 LAB — GLUCOSE, RANDOM: Glucose, Bld: 87 mg/dL (ref 70–99)

## 2013-07-29 MED ORDER — VALACYCLOVIR HCL 1 G PO TABS: ORAL_TABLET | ORAL | Status: DC

## 2013-07-29 MED ORDER — SIMVASTATIN 20 MG PO TABS: ORAL_TABLET | ORAL | Status: DC

## 2013-07-29 MED ORDER — ZOLPIDEM TARTRATE ER 12.5 MG PO TBCR: 12.5000 mg | EXTENDED_RELEASE_TABLET | Freq: Every evening | ORAL | Status: DC | PRN

## 2013-07-29 NOTE — Patient Instructions (Signed)
Continue your current medications. Please call the [facility] to schedule your bone density test.  It is recommended that you get at least 30 minutes of aerobic exercise at least 5 days/week (for weight loss, you may need as much as 60-90 minutes). This can be any activity that gets your heart rate up. This can be divided in 10-15 minute intervals if needed, but try and build up your endurance at least once a week.  Weight bearing exercise is also recommended twice weekly.  Try and limit portions, eat healthy diet, in order to lose weight.

## 2013-07-29 NOTE — Progress Notes (Signed)
Chief Complaint  Patient presents with  . Hypertension    fasting med check.   Had URI a month ago.  She was in bed for 4 days, but ultimately got better without any antibiotics. Cough only recently completely resolved in the last few days.  Hasn't been having any allergy symptoms recently, so not using flonase yet.   Hypertension follow-up: Blood pressures elsewhere are 120-130/70-85. Denies dizziness, headaches, chest pain. Denies side effects of medications, no cough.   Depression: Moods seem to be well controlled on the current dose. Some days feels more down than others, but overall doing okay--"maintaining", a few less bad days than per last visit. Denies SI/HI. Denies side effects of medications.   Hyperlipidemia follow-up: Patient is reportedly following a low-fat, low cholesterol diet. Compliant with medications and denies medication side effects.   Osteoporosis: She restarted Fosamax in September. (she had been off meds for about 6-8 months, got "out of habit", not due to any side effect or problem).  Denies dysphagia or chest pain.  Last DEXA was 11/2010.  One was ordered in September but apparently they never called her to schedule it, and she forgot all about it.  Order is still active in system.  Insomnia: requires Ambien CR nightly. She sent two texts during the night (as she was drifting off to sleep) --to her friend and her sister.  No other unusual behavior.  She keeps the phone farther away, and hasn't done it since.    H/o DM, now just impaired fasting glucose (improved after bariatric surgery and weight loss). She denies polydipsia, polyuria, vision changes, numbness or tingling.   Past Medical History  Diagnosis Date  . Breast cancer 2001    R breast(lumpectomy,chemo,radiation,tamoxifen)  . Hypertension     resolved after weight loss surgery; recurred 2013  . Diabetes mellitus     resolved after weight loss surgery  . Microalbuminuria     h/o  . Iron deficiency      h/o  . Depression   . Anxiety   . Sleep apnea     resolved after weight loss surgery  . Osteoporosis   . Plantar fasciitis (07') DrRegal    s/p surgical release 01/2011  . Vitamin D deficiency   . OA (osteoarthritis) of knee   . Insomnia chronic  . Allergy     fall seasonal  . Blood transfusion     Platelet transfusion when on heparin  . Hyperlipidemia    Past Surgical History  Procedure Laterality Date  . Breast lumpectomy Right  11/1999  . Abdominal hysterectomy  2000    and RSO(endometriosis)  . Appendectomy    . Cuboid stress fracture Right 12/2002  . Left shoulder fracture repair  (DrMortensen) 12/05  . 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 Myringotomy      tubes B/L, multiple sets  . Foot surgery Left 2008  . Colonoscopy  2002, 2012  . Tonsillectomy and adenoidectomy    . Plantar fasciitis release Right 01/30/11    Dr. Paulla Dolly   History   Social History  . Marital Status: Married    Spouse Name: N/A    Number of Children: N/A  . Years of Education: N/A   Occupational History  . Not on file.   Social History Main Topics  . Smoking status: Former Smoker    Quit date: 01/28/1996  . Smokeless tobacco: Never Used  . Alcohol Use: Yes     Comment: glass of wine or beer  maybe once a month.  . Drug Use: No  . Sexual Activity: Not Currently    Partners: Male   Other Topics Concern  . Not on file   Social History Narrative   Works at [employer], as a copy and Programme researcher, broadcasting/film/video.  Lives with husband, 3 dogs, 1 cat, 4 fish   Outpatient Encounter Prescriptions as of 07/29/2013  Medication Sig Note  . alendronate (FOSAMAX) 70 MG tablet Take 1 tablet (70 mg total) by mouth every 7 (seven) days. Take with a full glass of water on an empty stomach.   . Calcium Carbonate-Vitamin D (CALTRATE 600+D) 600-400 MG-UNIT per tablet Take 1 tablet by mouth 2 (two) times daily.    . cholecalciferol (VITAMIN D) 1000 UNITS tablet Take 1,000 Units by mouth daily.     Marland Kitchen lisinopril  (PRINIVIL,ZESTRIL) 10 MG tablet TAKE 1 TABLET DAILY   . Multiple Vitamins-Minerals (MULTIVITAMIN WITH MINERALS) tablet Take 1 tablet by mouth daily.     . Omega-3 Fatty Acids (FISH OIL) 1200 MG CAPS Take 1 capsule by mouth daily. 2 nightly   . PARoxetine (PAXIL) 40 MG tablet TAKE 1 TABLET EVERY MORNING   . simvastatin (ZOCOR) 20 MG tablet TAKE 1 TABLET AT BEDTIME   . vitamin C (ASCORBIC ACID) 500 MG tablet Take 500 mg by mouth daily.     . vitamin E 400 UNIT capsule Take 400 Units by mouth daily.     Marland Kitchen zolpidem (AMBIEN CR) 12.5 MG CR tablet Take 1 tablet (12.5 mg total) by mouth at bedtime as needed. 07/29/2013: Takes daily  . fluticasone (FLONASE) 50 MCG/ACT nasal spray Place 2 sprays into the nose daily. 07/29/2013: Seasonally, not needing now  . ibuprofen (ADVIL,MOTRIN) 200 MG tablet Take 800 mg by mouth every 8 (eight) hours as needed for pain.    Marland Kitchen Lysine 1000 MG TABS Take 1 tablet by mouth daily as needed. Cold sores 07/04/2011: AS NEEDED  . simethicone (MYLICON) 80 MG chewable tablet Chew 80 mg by mouth as needed.     . valACYclovir (VALTREX) 1000 MG tablet Take 2 tablets at onset of cold sore, and repeat in 12 hours (4 tablets per episode of cold sore) 01/28/2013: As needed  . [DISCONTINUED] benzonatate (TESSALON) 100 MG capsule Take 1-2 capsules (100-200 mg total) by mouth 3 (three) times daily as needed for cough.   . [DISCONTINUED] HYDROcodone-homatropine (HYCODAN) 5-1.5 MG/5ML syrup Take 5 mLs by mouth every 8 (eight) hours as needed for cough.     Allergies  Allergen Reactions  . Heparin Other (See Comments)    Low platelets  . Niacin And Related Other (See Comments)    Blotchy/itchy  . Tessalon [Benzonatate] Diarrhea  . Penicillins Rash   ROS:  Denies fevers, URI symptoms, cough, shortness of breath, headache, dizziness, chest pain, palpitations, nausea, vomiting, bowel changes, urinary complaints, rashes, bleeding, bruising, myalgias, arthralgias.  +stable depression,  +insomnia.  PHYSICAL EXAM: BP 118/76  Pulse 60  Ht 4\' 9"  (1.448 m)  Wt 150 lb (68.04 kg)  BMI 32.45 kg/m2  Well developed, pleasant overweight female in no distress  Neck: no lymphadenopathy, thyromegaly or bruit  Heart: regular rate and rhythm without murmur  Lungs clear bilaterally, no wheezes, rales, ronchi Abdomen: soft, nontender, no mass, no organomegaly or mass Extremities: no edema, 2+ pulses Skin: no rash or lesions Psych: normal mood, affect, hygiene and grooming  Neuro: alert and oriented, normal gait  ASSESSMENT/PLAN:  Essential hypertension, benign - controlled  Impaired fasting  glucose - Plan: TSH, Hemoglobin A1c, Glucose, random  Osteoporosis, unspecified - continue Fosamax.  Schedule DEXA  Pure hypercholesterolemia - Plan: Hepatic function panel, Lipid panel, simvastatin (ZOCOR) 20 MG tablet  Insomnia - chronic; controlled with Ambien CR - Plan: zolpidem (AMBIEN CR) 12.5 MG CR tablet  History of bariatric surgery - Plan: Vit D  25 hydroxy (rtn osteoporosis monitoring), TSH, Vitamin B12, Ferritin, Iron  Anemia, iron deficiency - Plan: TSH, Vitamin B12, Ferritin, Iron  Herpes labialis - Plan: valACYclovir (VALTREX) 1000 MG tablet  Obesity (BMI 30-39.9) - counseled re: exercise, diet--portions, healthy diet  Glucose, A1c, LFT's, lipids, TSH Vit D, B12 and iron/ferritin, given h/o anemia as well as h/o bariatric surgery  F/u 6 months for CPE/fasting med check

## 2013-07-30 ENCOUNTER — Other Ambulatory Visit: Payer: Self-pay | Admitting: Family Medicine

## 2013-07-30 LAB — VITAMIN D 25 HYDROXY (VIT D DEFICIENCY, FRACTURES): VIT D 25 HYDROXY: 94 ng/mL — AB (ref 30–89)

## 2013-07-30 LAB — FERRITIN: Ferritin: 56 ng/mL (ref 10–291)

## 2013-07-30 LAB — VITAMIN B12: Vitamin B-12: 675 pg/mL (ref 211–911)

## 2013-07-30 LAB — TSH: TSH: 0.623 u[IU]/mL (ref 0.350–4.500)

## 2013-10-29 ENCOUNTER — Telehealth: Payer: Self-pay | Admitting: *Deleted

## 2013-10-29 DIAGNOSIS — G4700 Insomnia, unspecified: Secondary | ICD-10-CM

## 2013-10-29 MED ORDER — ZOLPIDEM TARTRATE ER 12.5 MG PO TBCR: 12.5000 mg | EXTENDED_RELEASE_TABLET | Freq: Every evening | ORAL | Status: DC | PRN

## 2013-10-29 NOTE — Telephone Encounter (Signed)
Ok for 90

## 2013-10-29 NOTE — Telephone Encounter (Signed)
Patient called requesting a refill on Ambien CR be sent to [facility], she has enough to last until next Tuesday.

## 2013-10-29 NOTE — Telephone Encounter (Signed)
rx faxed to [facility] 

## 2014-01-02 ENCOUNTER — Other Ambulatory Visit: Payer: Self-pay | Admitting: Family Medicine

## 2014-01-03 ENCOUNTER — Other Ambulatory Visit: Payer: Self-pay | Admitting: Family Medicine

## 2014-01-15 ENCOUNTER — Other Ambulatory Visit: Payer: Self-pay | Admitting: Family Medicine

## 2014-01-28 ENCOUNTER — Encounter: Payer: Self-pay | Admitting: Family Medicine

## 2014-01-28 ENCOUNTER — Ambulatory Visit (INDEPENDENT_AMBULATORY_CARE_PROVIDER_SITE_OTHER): Payer: 59 | Admitting: Family Medicine

## 2014-01-28 VITALS — BP 130/80 | HR 60 | Ht <= 58 in | Wt 148.0 lb

## 2014-01-28 DIAGNOSIS — F329 Major depressive disorder, single episode, unspecified: Secondary | ICD-10-CM

## 2014-01-28 DIAGNOSIS — M949 Disorder of cartilage, unspecified: Secondary | ICD-10-CM

## 2014-01-28 DIAGNOSIS — M79609 Pain in unspecified limb: Secondary | ICD-10-CM

## 2014-01-28 DIAGNOSIS — M79672 Pain in left foot: Secondary | ICD-10-CM

## 2014-01-28 DIAGNOSIS — Z23 Encounter for immunization: Secondary | ICD-10-CM

## 2014-01-28 DIAGNOSIS — M8580 Other specified disorders of bone density and structure, unspecified site: Secondary | ICD-10-CM

## 2014-01-28 DIAGNOSIS — C50911 Malignant neoplasm of unspecified site of right female breast: Secondary | ICD-10-CM

## 2014-01-28 DIAGNOSIS — M899 Disorder of bone, unspecified: Secondary | ICD-10-CM

## 2014-01-28 DIAGNOSIS — Z79899 Other long term (current) drug therapy: Secondary | ICD-10-CM

## 2014-01-28 DIAGNOSIS — Z0000 Encounter for general adult medical examination without abnormal findings: Secondary | ICD-10-CM

## 2014-01-28 DIAGNOSIS — J309 Allergic rhinitis, unspecified: Secondary | ICD-10-CM

## 2014-01-28 DIAGNOSIS — M810 Age-related osteoporosis without current pathological fracture: Secondary | ICD-10-CM

## 2014-01-28 DIAGNOSIS — R7301 Impaired fasting glucose: Secondary | ICD-10-CM

## 2014-01-28 DIAGNOSIS — G4700 Insomnia, unspecified: Secondary | ICD-10-CM

## 2014-01-28 DIAGNOSIS — F3289 Other specified depressive episodes: Secondary | ICD-10-CM

## 2014-01-28 DIAGNOSIS — I10 Essential (primary) hypertension: Secondary | ICD-10-CM

## 2014-01-28 DIAGNOSIS — E7800 Pure hypercholesterolemia, unspecified: Secondary | ICD-10-CM

## 2014-01-28 DIAGNOSIS — M79671 Pain in right foot: Secondary | ICD-10-CM

## 2014-01-28 DIAGNOSIS — C50919 Malignant neoplasm of unspecified site of unspecified female breast: Secondary | ICD-10-CM

## 2014-01-28 LAB — POCT URINALYSIS DIPSTICK
Bilirubin, UA: NEGATIVE
Blood, UA: NEGATIVE
Glucose, UA: NEGATIVE
Ketones, UA: NEGATIVE
Leukocytes, UA: NEGATIVE
Nitrite, UA: NEGATIVE
Spec Grav, UA: 1.02
Urobilinogen, UA: NEGATIVE
pH, UA: 5

## 2014-01-28 LAB — CBC WITH DIFFERENTIAL/PLATELET
BASOS ABS: 0 10*3/uL (ref 0.0–0.1)
Basophils Relative: 0 % (ref 0–1)
Eosinophils Absolute: 0.2 10*3/uL (ref 0.0–0.7)
Eosinophils Relative: 4 % (ref 0–5)
HEMATOCRIT: 37.3 % (ref 36.0–46.0)
Hemoglobin: 12.5 g/dL (ref 12.0–15.0)
LYMPHS PCT: 44 % (ref 12–46)
Lymphs Abs: 2.4 10*3/uL (ref 0.7–4.0)
MCH: 29.5 pg (ref 26.0–34.0)
MCHC: 33.5 g/dL (ref 30.0–36.0)
MCV: 88 fL (ref 78.0–100.0)
Monocytes Absolute: 0.4 10*3/uL (ref 0.1–1.0)
Monocytes Relative: 8 % (ref 3–12)
NEUTROS ABS: 2.4 10*3/uL (ref 1.7–7.7)
NEUTROS PCT: 44 % (ref 43–77)
Platelets: 205 10*3/uL (ref 150–400)
RBC: 4.24 MIL/uL (ref 3.87–5.11)
RDW: 14.4 % (ref 11.5–15.5)
WBC: 5.4 10*3/uL (ref 4.0–10.5)

## 2014-01-28 MED ORDER — FLUTICASONE PROPIONATE 50 MCG/ACT NA SUSP: 2.0000 | Freq: Every day | NASAL | Status: DC

## 2014-01-28 MED ORDER — SIMVASTATIN 20 MG PO TABS: ORAL_TABLET | ORAL | Status: DC

## 2014-01-28 MED ORDER — ALENDRONATE SODIUM 70 MG PO TABS: 70.0000 mg | ORAL_TABLET | ORAL | Status: DC

## 2014-01-28 MED ORDER — ZOLPIDEM TARTRATE ER 12.5 MG PO TBCR: 12.5000 mg | EXTENDED_RELEASE_TABLET | Freq: Every evening | ORAL | Status: DC | PRN

## 2014-01-28 NOTE — Progress Notes (Signed)
Chief Complaint  Patient presents with  . cpe -fasting    cpe fasting, med check,- both feet have a lump on the side that bothers her and needs refill on ambien, flu shot today. no other concerns   Julie Fischer is a 53 y.o. female who presents for a complete physical.  She has the following concerns:  She has tender lumps on the sides of both feet. Hurts to stand, and worse at the end of the day.  Symptoms started in the last month.  Denies injury, fall.  She has new shoes, but may have started earlier.  Right hurts more than left.  Hypertension follow-up: Blood pressures elsewhere are 120-130/70-80. Denies dizziness, headaches, chest pain. Denies side effects of medications, no cough.   Depression: Moods seem to be well controlled on the current dose. Some days feels more down than others, but overall doing okay. Only has about 2-3 bad days/month, better than in the past. Denies SI/HI. Denies side effects of medications.   Hyperlipidemia follow-up: Patient is reportedly following a low-fat, low cholesterol diet. Compliant with medications and denies medication side effects.   Osteoporosis: She restarted Fosamax in September. (she had been off meds for about 6-8 months, got "out of habit", not due to any side effect or problem). She admits that she has had difficulty getting back in the habit of a weekly medications, and hasn't taken it in the last month. Denies dysphagia or chest pain. Last DEXA was 11/2010. One was ordered in September but apparently they never called her to schedule it, and she forgot all about it. This was discussed again at her last visit in March, but not yet done.  Vitamin D level was noted to be elevated in March, and dose was decreased.    H/o breast cancer:  She is no longer under the care of oncologist (since Dr. Truddie Coco left--now is in [place], and she would go up to [facility] to see him if there are any recurrences or problems).  She is past due for mammogram.  Insomnia:  requires Ambien CR nightly. No unusual behavior (keeps phone downstairs, not sending any texts late). It works well for her.  She doesn't sleep at all if she doesn't take it, "the brain won't shut up"..  H/o DM, now just impaired fasting glucose (improved after bariatric surgery and weight loss). She denies polydipsia, polyuria, vision changes, numbness or tingling.   Immunization History  Administered Date(s) Administered  . Influenza Split 02/07/2009, 04/10/2010, 04/19/2011, 01/28/2012  . Influenza,inj,Quad PF,36+ Mos 01/28/2013, 01/28/2014  . Pneumococcal Polysaccharide-23 06/04/2001, 11/15/2010  . Td 06/04/2001  . Tdap 11/15/2010   Last Pap smear: s/p hysterectomy  Last mammogram: 07/2012  Last colonoscopy: 12/2010  Last DEXA: through oncologist, 11/2010. T-2.7 (osteoporosis) --ordered last year, not done. Ophtho: yearly, Last in 04/2013 Dentist: twice yearly  Exercise: walks 2-3x per week  Past Medical History  Diagnosis Date  . Breast cancer 2001    R breast(lumpectomy,chemo,radiation,tamoxifen)  . Hypertension     resolved after weight loss surgery; recurred 2013  . Diabetes mellitus     resolved after weight loss surgery  . Microalbuminuria     h/o  . Iron deficiency     h/o  . Depression   . Anxiety   . Sleep apnea     resolved after weight loss surgery  . Osteoporosis   . Plantar fasciitis (07') DrRegal    s/p surgical release 01/2011  . Vitamin D deficiency   .  OA (osteoarthritis) of knee   . Insomnia chronic  . Allergy     fall seasonal  . Blood transfusion     Platelet transfusion when on heparin  . Hyperlipidemia     Past Surgical History  Procedure Laterality Date  . Breast lumpectomy Right  11/1999  . Abdominal hysterectomy  2000    and RSO(endometriosis)  . Appendectomy    . Cuboid stress fracture Right 12/2002  . Left shoulder fracture repair  (DrMortensen) 12/05  . 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 Myringotomy      tubes B/L, multiple sets   . Foot surgery Left 2008  . Colonoscopy  2002, 2012  . Tonsillectomy and adenoidectomy    . Plantar fasciitis release Right 01/30/11    Dr. Paulla Dolly    History   Social History  . Marital Status: Married    Spouse Name: N/A    Number of Children: N/A  . Years of Education: N/A   Occupational History  . Not on file.   Social History Main Topics  . Smoking status: Former Smoker    Quit date: 01/28/1996  . Smokeless tobacco: Never Used  . Alcohol Use: Yes     Comment: glass of wine or beer maybe once a month.  . Drug Use: No  . Sexual Activity: Not Currently    Partners: Male   Other Topics Concern  . Not on file   Social History Narrative   Works at [employer], as a copy and Programme researcher, broadcasting/film/video.  Lives with husband, 3 dogs, 1 cat, 8 fish    Family History  Problem Relation Age of Onset  . Dementia Mother   . Stroke Mother 79    due to aneurysm  . Macular degeneration Mother   . Cancer Mother     colon cancer  . Colon cancer Mother   . Pancreatic cancer Father   . Cancer Father   . Hyperlipidemia Sister   . Hyperlipidemia Brother   . Diabetes Brother   . Hypertension Brother   . Diabetes Maternal Grandmother   . Hyperlipidemia Sister   . Breast cancer Cousin   . Breast cancer Cousin    Outpatient Encounter Prescriptions as of 01/28/2014  Medication Sig Note  . Calcium Carbonate-Vitamin D (CALTRATE 600+D) 600-400 MG-UNIT per tablet Take 1 tablet by mouth 2 (two) times daily.    . cholecalciferol (VITAMIN D) 1000 UNITS tablet Take 1,000 Units by mouth daily.   01/28/2014: Taking every other day  . ibuprofen (ADVIL,MOTRIN) 200 MG tablet Take 800 mg by mouth every 8 (eight) hours as needed for pain.    Marland Kitchen lisinopril (PRINIVIL,ZESTRIL) 10 MG tablet TAKE 1 TABLET DAILY   . Lysine 1000 MG TABS Take 1 tablet by mouth daily as needed. Cold sores 07/04/2011: AS NEEDED  . Multiple Vitamins-Minerals (MULTIVITAMIN WITH MINERALS) tablet Take 1 tablet by mouth daily.     .  Omega-3 Fatty Acids (FISH OIL) 1200 MG CAPS Take 1 capsule by mouth daily. 2 nightly   . PARoxetine (PAXIL) 40 MG tablet TAKE 1 TABLET EVERY MORNING   . simethicone (MYLICON) 80 MG chewable tablet Chew 80 mg by mouth as needed.     . simvastatin (ZOCOR) 20 MG tablet TAKE 1 TABLET AT BEDTIME   . valACYclovir (VALTREX) 1000 MG tablet Take 2 tablets at onset of cold sore, and repeat in 12 hours (4 tablets per episode of cold sore) 01/28/2014: Uses prn flares (once a year)  .  vitamin C (ASCORBIC ACID) 500 MG tablet Take 500 mg by mouth daily.     . vitamin E 400 UNIT capsule Take 400 Units by mouth daily.     Marland Kitchen zolpidem (AMBIEN CR) 12.5 MG CR tablet Take 1 tablet (12.5 mg total) by mouth at bedtime as needed.   Marland Kitchen alendronate (FOSAMAX) 70 MG tablet Take 1 tablet (70 mg total) by mouth every 7 (seven) days. Take with a full glass of water on an empty stomach. 01/28/2014: Hasn't taken it in about a month, has been forgetful  . fluticasone (FLONASE) 50 MCG/ACT nasal spray Place 2 sprays into the nose daily. 01/28/2014: Takes seasonally; allergies have started, but hasn't restarted flonase yet  . [DISCONTINUED] simvastatin (ZOCOR) 20 MG tablet TAKE 1 TABLET AT BEDTIME      Allergies  Allergen Reactions  . Heparin Other (See Comments)    Low platelets  . Niacin And Related Other (See Comments)    Blotchy/itchy  . Tessalon [Benzonatate] Diarrhea  . Penicillins Rash    ROS: The patient denies anorexia, fever, weight changes, headaches, vision changes, decreased hearing, ear pain, sore throat, breast concerns, chest pain, palpitations, dizziness, syncope, dyspnea on exertion, cough, swelling, nausea, vomiting, diarrhea, constipation, abdominal pain, melena, hematochezia, indigestion/heartburn, hematuria, incontinence, dysuria, vaginal bleeding, discharge, odor or itch, genital lesions, joint pains, numbness, tingling, weakness, tremor, suspicious skin lesions, abnormal bleeding/bruising, or enlarged lymph  nodes.  Feet pain as per HPI Chronic insomnia.  Moods well controlled  PHYSICAL EXAM:  BP 130/80  Pulse 60  Ht 4' 8"  (1.422 m)  Wt 148 lb (67.132 kg)  BMI 33.20 kg/m2  General Appearance:  Alert, cooperative, no distress, appears stated age   Head:  Normocephalic, without obvious abnormality, atraumatic   Eyes:  PERRL, conjunctiva/corneas clear, EOM's intact, fundi  benign   Ears:  Normal TM's and external ear canals. Blue PE tube noted in right ear   Nose:  Nasal mucosa mildly edematous with clear mucus. Sinuses nontender   Throat:  Lips, mucosa, and tongue normal; teeth and gums normal   Neck:  Supple, no lymphadenopathy; thyroid: no enlargement/tenderness/nodules; no carotid  bruit or JVD   Back:  Spine nontender, no curvature, ROM normal, no CVA tenderness   Lungs:  Clear to auscultation bilaterally without wheezes, rales or ronchi; respirations unlabored   Chest Wall:  No tenderness or deformity   Heart:  Regular rate and rhythm, S1 and S2 normal, no murmur, rub  or gallop   Breast Exam:  R breast--s/p surgery with scarring and some skin changes related to previous radiation. No focal mass. No tenderness, nipple discharge or inversion. L breast exam is entirely normal. No axillary lymphadenopathy   Abdomen:  Soft, nondistended, normoactive bowel sounds,  no masses, no hepatosplenomegaly   Genitalia:  Normal external genitalia without lesions. BUS and vagina normal; Bimanual exam revealed surgically absent uterus. No adnexal masses. No mass, rebound tenderness or guarding   Rectal:  Normal tone, no masses or tenderness; guaiac negative stool   Extremities:  No clubbing, cyanosis or edema. Feet:  There is bony prominence bilaterally on the lateral aspects of the foot, at proximal 5th metatarsal.  Only very slight overlying callous. No erythema or warmth.  Pulses:  2+ and symmetric all extremities   Skin:  Skin color, texture, turgor normal, no rashes or lesions   Lymph nodes:   Cervical, supraclavicular, and axillary nodes normal   Neurologic:  CNII-XII intact, normal strength, sensation and gait; reflexes 2+  and symmetric throughout         Psych: Normal mood, affect, hygiene and grooming.   Urine dip:  Trace protein, otherwise negative.  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 Past due for DEXA--call to schedule  Allergic rhinitis, cause unspecified - Plan: fluticasone (FLONASE) 50 MCG/ACT nasal spray  Bilateral foot pain - suspect bunion; x-rays; padding, wide shoes.  f/u with Dr. Paulla Dolly if worse. - Plan: DG Foot Complete Left, DG Foot Complete Right  Osteopenia due to cancer therapy - Plan: alendronate (FOSAMAX) 70 MG tablet  Breast cancer, right - past due for mammogram; reminded to schedule - Plan: alendronate (FOSAMAX) 70 MG tablet  Osteoporosis - Due for f/u DEXA.  restart alendronate weekly.  If she prefers to change to a monthly dosed medication (Boniva, Actonel) she will let us know - Plan: alendronate (FOSAMAX) 70 MG tablet, Vit D  25 hydroxy (rtn osteoporosis monitoring)  Encounter for long-term (current) use of other medications - Plan: Lipid panel, Comprehensive metabolic panel,  CBC with Differential, Vit D  25 hydroxy (rtn osteoporosis monitoring)   Bunion pads, wide shoes, NSAID prn.  X-rays today. ?Tailor's bunion If ongoing/worsening, f/u with Dr. Paulla Dolly.  Discussed monthly self breast exams and yearly mammograms; at least 30 minutes of aerobic activity at least 5 days/week; proper sunscreen use reviewed; healthy diet, including goals of calcium and vitamin D intake and alcohol recommendations (less than or equal to 1 drink/day) reviewed; regular seatbelt use; changing batteries in smoke detectors. Immunization recommendations discussed--UTD, flu shot given. Colonoscopy recommendations reviewed, UTD --due again in 2017 (5 year f/u) due to family h/o colon cancer (mother).  Vitamin D level (to ensure no longer high) A1c, c-met, lipid, CBC DEXA order is good until 03/30/14--pt to call to schedule DEXA and mammo

## 2014-01-28 NOTE — Patient Instructions (Signed)
  HEALTH MAINTENANCE RECOMMENDATIONS:  It is recommended that you get at least 30 minutes of aerobic exercise at least 5 days/week (for weight loss, you may need as much as 60-90 minutes). This can be any activity that gets your heart rate up. This can be divided in 10-15 minute intervals if needed, but try and build up your endurance at least once a week.  Weight bearing exercise is also recommended twice weekly.  Eat a healthy diet with lots of vegetables, fruits and fiber.  "Colorful" foods have a lot of vitamins (ie green vegetables, tomatoes, red peppers, etc).  Limit sweet tea, regular sodas and alcoholic beverages, all of which has a lot of calories and sugar.  Up to 1 alcoholic drink daily may be beneficial for women (unless trying to lose weight, watch sugars).  Drink a lot of water.  Calcium recommendations are 1200-1500 mg daily (1500 mg for postmenopausal women or women without ovaries), and vitamin D 1000 IU daily.  This should be obtained from diet and/or supplements (vitamins), and calcium should not be taken all at once, but in divided doses.  Monthly self breast exams and yearly mammograms for women over the age of 23 is recommended.  Sunscreen of at least SPF 30 should be used on all sun-exposed parts of the skin when outside between the hours of 10 am and 4 pm (not just when at beach or pool, but even with exercise, golf, tennis, and yard work!)  Use a sunscreen that says "broad spectrum" so it covers both UVA and UVB rays, and make sure to reapply every 1-2 hours.  Remember to change the batteries in your smoke detectors when changing your clock times in the spring and fall.  Use your seat belt every time you are in a car, and please drive safely and not be distracted with cell phones and texting while driving.  SCHEDULE YOUR MAMMOGRAM AND YOUR BONE DENSITY  Try padding around the bunion/bony prominence to decrease the pressure, and try wider shoes.  Go to [facility] for  x-rays.  If pain persists/worsens, follow up with Dr. Paulla Dolly

## 2014-01-29 ENCOUNTER — Telehealth: Payer: Self-pay | Admitting: Family Medicine

## 2014-01-29 DIAGNOSIS — M810 Age-related osteoporosis without current pathological fracture: Secondary | ICD-10-CM

## 2014-01-29 LAB — COMPREHENSIVE METABOLIC PANEL
ALBUMIN: 4.4 g/dL (ref 3.5–5.2)
ALT: 28 U/L (ref 0–35)
AST: 24 U/L (ref 0–37)
Alkaline Phosphatase: 41 U/L (ref 39–117)
BUN: 12 mg/dL (ref 6–23)
CALCIUM: 9.1 mg/dL (ref 8.4–10.5)
CHLORIDE: 105 meq/L (ref 96–112)
CO2: 27 meq/L (ref 19–32)
CREATININE: 0.44 mg/dL — AB (ref 0.50–1.10)
Glucose, Bld: 95 mg/dL (ref 70–99)
POTASSIUM: 3.9 meq/L (ref 3.5–5.3)
Sodium: 141 mEq/L (ref 135–145)
Total Bilirubin: 0.3 mg/dL (ref 0.2–1.2)
Total Protein: 6.3 g/dL (ref 6.0–8.3)

## 2014-01-29 LAB — HEMOGLOBIN A1C
Hgb A1c MFr Bld: 6 % — ABNORMAL HIGH (ref <5.7)
Mean Plasma Glucose: 126 mg/dL — ABNORMAL HIGH (ref <117)

## 2014-01-29 LAB — LIPID PANEL
CHOLESTEROL: 136 mg/dL (ref 0–200)
HDL: 48 mg/dL (ref >39)
LDL Cholesterol: 64 mg/dL (ref 0–99)
Total CHOL/HDL Ratio: 2.8 Ratio
Triglycerides: 119 mg/dL (ref <150)
VLDL: 24 mg/dL (ref 0–40)

## 2014-01-29 LAB — VITAMIN D 25 HYDROXY (VIT D DEFICIENCY, FRACTURES): VIT D 25 HYDROXY: 66 ng/mL (ref 30–89)

## 2014-01-29 NOTE — Telephone Encounter (Signed)
Pt came in and stated that when she tried to get a mammogram and done density at the breast center. They informed her that the referral was too old and they needed a new one. Please handle for pt.

## 2014-01-29 NOTE — Telephone Encounter (Signed)
She doesn't need an order for mammogram (they have been doing screening, she can schedule herself).  The DEXA was ordered 01/28/13, but expiration date had been extended.  I guess they didn't like (or see that).  New order was put in.

## 2014-02-01 NOTE — Telephone Encounter (Signed)
Call and left a message with spouse to calls back.

## 2014-02-02 NOTE — Telephone Encounter (Signed)
Pt called and back and informed.

## 2014-04-02 ENCOUNTER — Other Ambulatory Visit: Payer: Self-pay | Admitting: Family Medicine

## 2014-04-02 DIAGNOSIS — F325 Major depressive disorder, single episode, in full remission: Secondary | ICD-10-CM

## 2014-04-02 NOTE — Telephone Encounter (Signed)
done

## 2014-04-02 NOTE — Telephone Encounter (Signed)
Is this okay?

## 2014-05-03 ENCOUNTER — Other Ambulatory Visit: Payer: Self-pay | Admitting: Family Medicine

## 2014-05-04 NOTE — Telephone Encounter (Signed)
Yes, okay to refill #90

## 2014-05-04 NOTE — Telephone Encounter (Signed)
Is this okay to refill? 

## 2014-06-14 ENCOUNTER — Other Ambulatory Visit: Payer: Self-pay | Admitting: Family Medicine

## 2014-06-14 NOTE — Telephone Encounter (Signed)
Is this okay?

## 2014-07-12 ENCOUNTER — Ambulatory Visit (INDEPENDENT_AMBULATORY_CARE_PROVIDER_SITE_OTHER): Payer: 59 | Admitting: Family Medicine

## 2014-07-12 ENCOUNTER — Encounter: Payer: Self-pay | Admitting: Family Medicine

## 2014-07-12 VITALS — BP 130/72 | HR 64 | Ht <= 58 in | Wt 148.0 lb

## 2014-07-12 DIAGNOSIS — M7061 Trochanteric bursitis, right hip: Secondary | ICD-10-CM

## 2014-07-12 DIAGNOSIS — R197 Diarrhea, unspecified: Secondary | ICD-10-CM

## 2014-07-12 DIAGNOSIS — M79604 Pain in right leg: Secondary | ICD-10-CM

## 2014-07-12 MED ORDER — NAPROXEN 500 MG PO TABS: 500.0000 mg | ORAL_TABLET | Freq: Two times a day (BID) | ORAL | Status: DC

## 2014-07-12 NOTE — Patient Instructions (Signed)
Take the naproxen twice daily with food, until your pain has completely resolved (ie you can stop after 7-10 days if 100% better; okay to use full 2 weeks if needed.  Cut the dose or stop if it bothers your stomach).  Take your prescribed anti-inflammatory medication with food; discontinue or cut back the dose if you develop stomach pain/discomfort/side effects.  Do not take other over-the-counter pain medications such as ibuprofen, advil, motrin, aleve, naproxen at the same time.  Do not use longer than recommended.  It is okay to use acetaminophen (tylenol) along with this medication.  Return in 2 weeks if ongoing problems.  Consider checking with pharmacy to see if supplier of generic has changed. Consider using imodium as needed just that day.  If very bothersome, consider trial of a once monthly bisophosphonate rather than weekly.

## 2014-07-12 NOTE — Progress Notes (Signed)
Chief Complaint  Patient presents with  . Leg Pain    right leg (from above knee about 5-6 inches up) x 1 month. Stretches and heat did not work, has been taking some tylenol.     She is having pain in her right anterior thigh for 4-6 weeks.  Pain feels muscular, like a stabbing/burning pain.  Pain is constant--worse as the day goes on, but not with any particular activity.  No known change in activity, injury, trauma.  She has been trying to stretch her quadriceps for a couple of weeks, but didn't notice much difference.  She has been taking 2 tylenol/day (in the evening), which helps some.  Heat doesn't help.  Rubbing/"beating"/massage doesn't seem to help (but she continues to rub it).  Maybe was carrying a heavy office chair down the stairs around that time, but no other change in activity.   Denies numbness, tingling.  Denies any back pain.  No rash/skin lesions.  She has diarrhea within 2 hours of taking the fosamax each week.  It lasts for about 4 hours (2-3 episodes of diarrhea over 4 hours).  She has been on fosamax for many years. She had stopped taking it for a while, but back to taking it again, thinks she has had this problem the whole time, not new.  PMH, PSH, SH reviewed.  Outpatient Encounter Prescriptions as of 07/12/2014  Medication Sig Note  . acetaminophen (TYLENOL) 500 MG tablet Take 1,000 mg by mouth every 6 (six) hours as needed.   Marland Kitchen alendronate (FOSAMAX) 70 MG tablet Take 1 tablet (70 mg total) by mouth every 7 (seven) days. Take with a full glass of water on an empty stomach.   . Calcium Carbonate-Vitamin D (CALTRATE 600+D) 600-400 MG-UNIT per tablet Take 1 tablet by mouth 2 (two) times daily.    . cholecalciferol (VITAMIN D) 1000 UNITS tablet Take 1,000 Units by mouth daily.   01/28/2014: Taking every other day  . lisinopril (PRINIVIL,ZESTRIL) 10 MG tablet TAKE 1 TABLET DAILY   . Multiple Vitamins-Minerals (MULTIVITAMIN WITH MINERALS) tablet Take 1 tablet by mouth daily.      . Omega-3 Fatty Acids (FISH OIL) 1200 MG CAPS Take 1 capsule by mouth daily. 2 nightly   . PARoxetine (PAXIL) 40 MG tablet TAKE 1 TABLET EVERY MORNING   . simvastatin (ZOCOR) 20 MG tablet TAKE 1 TABLET AT BEDTIME   . vitamin C (ASCORBIC ACID) 500 MG tablet Take 500 mg by mouth daily.     . vitamin E 400 UNIT capsule Take 400 Units by mouth daily.     Marland Kitchen zolpidem (AMBIEN CR) 12.5 MG CR tablet TAKE 1 TABLET BY MOUTH AT BEDTIME AS NEEDED   . fluticasone (FLONASE) 50 MCG/ACT nasal spray Place 2 sprays into both nostrils daily. (Patient not taking: Reported on 07/12/2014)   . ibuprofen (ADVIL,MOTRIN) 200 MG tablet Take 800 mg by mouth every 8 (eight) hours as needed for pain.    Marland Kitchen Lysine 1000 MG TABS Take 1 tablet by mouth daily as needed. Cold sores 07/04/2011: AS NEEDED  . simethicone (MYLICON) 80 MG chewable tablet Chew 80 mg by mouth as needed.     . valACYclovir (VALTREX) 1000 MG tablet TAKE 2 TABLETS AT ONSET OF COLD SORE AND REPEAT IN 12 HOURS (4 TABLETS PER EPISODE OF COLD SORE) (Patient not taking: Reported on 07/12/2014)    Allergies  Allergen Reactions  . Heparin Other (See Comments)    Low platelets  . Niacin And Related  Other (See Comments)    Blotchy/itchy  . Tessalon [Benzonatate] Diarrhea  . Penicillins Rash   ROS:  No fever, chills, headaches, dizziness, chest pain, palpitations, nausea, vomiting. +diarrhea weekly after fosamax as per HPI. No bleeding/bruising.  No weakness.  No back pain, urinary complaints. See HPI  PHYSICAL EXAM: BP 130/72 mmHg  Pulse 64  Ht 4\' 8"  (1.422 m)  Wt 148 lb (67.132 kg)  BMI 33.20 kg/m2 Very pleasant, obese female in good spirits. Constantly rubbing at her right thigh.  She is in no distress  Back: no CVA tenderness or SI joint tenderness.  Spine is nontender.  Mildly tender at right sciatic notch. She has some discomfort with pyriformis stretch.  She is tender over the trochanteric bursa on the right.  Nontender along IT band, no pain with ITB  stretch.  FROM of hip without pain. Neuro: Normal strength, sensation, DTR gait. Extremities: 2+ pulses, no edema  ASSESSMENT/PLAN:  Right leg pain - component of bursitis.  cannot r/o radiculpathy.  trial of NSAIDs.  risks/side effects reviewed. - Plan: naproxen (NAPROSYN) 500 MG tablet  Trochanteric bursitis of right hip  Diarrhea - once weekly, related to fosamax. Trial of imodium prn.  consider monthly bisphosphonate rather than weekly   Right thigh pain.  Likely multifactorial.  Suspect a component of bursitis.  Cannot r/o other causes as well.  Trial of NSAIDs along with ongoing heat/stretch. Shown quad stretches, pyriformis stretches, ITB stretches.  NSAID precautions were reviewed with the patient.  Patient should take medication with food, discontinue if develops GI side effects, not to take other OTC NSAIDs at the same time, and not to use longer than recommended.  Return in 2 weeks if not better. Might need x-rays, steroids or other additional eval depending on findings at that time.  Diarrhea related to fosamax.  ?if related to a generic.  Consider checking with pharmacy to see if supplier has changed.  Discussed using imodium as needed just that day.  If very bothersome, consider trial of a once monthly bisophosphonate rather than weekly.

## 2014-07-23 ENCOUNTER — Other Ambulatory Visit: Payer: Self-pay

## 2014-07-23 DIAGNOSIS — Z9889 Other specified postprocedural states: Secondary | ICD-10-CM

## 2014-07-23 DIAGNOSIS — Z1231 Encounter for screening mammogram for malignant neoplasm of breast: Secondary | ICD-10-CM

## 2014-07-23 DIAGNOSIS — Z853 Personal history of malignant neoplasm of breast: Secondary | ICD-10-CM

## 2014-07-28 ENCOUNTER — Ambulatory Visit
Admission: RE | Admit: 2014-07-28 | Discharge: 2014-07-28 | Disposition: A | Discharge status: Home / Self Care | Payer: 59 | Source: Ambulatory Visit

## 2014-07-28 ENCOUNTER — Telehealth: Payer: Self-pay | Admitting: Family Medicine

## 2014-07-28 DIAGNOSIS — Z1231 Encounter for screening mammogram for malignant neoplasm of breast: Secondary | ICD-10-CM

## 2014-07-28 DIAGNOSIS — Z853 Personal history of malignant neoplasm of breast: Secondary | ICD-10-CM

## 2014-07-28 DIAGNOSIS — Z9889 Other specified postprocedural states: Secondary | ICD-10-CM

## 2014-07-29 ENCOUNTER — Encounter: Payer: 59 | Admitting: Family Medicine

## 2014-07-29 ENCOUNTER — Ambulatory Visit (INDEPENDENT_AMBULATORY_CARE_PROVIDER_SITE_OTHER): Payer: 59 | Admitting: Family Medicine

## 2014-07-29 ENCOUNTER — Encounter: Payer: Self-pay | Admitting: Family Medicine

## 2014-07-29 VITALS — BP 140/74 | HR 60 | Ht <= 58 in | Wt 148.6 lb

## 2014-07-29 DIAGNOSIS — I10 Essential (primary) hypertension: Secondary | ICD-10-CM | POA: Diagnosis not present

## 2014-07-29 DIAGNOSIS — E7800 Pure hypercholesterolemia, unspecified: Secondary | ICD-10-CM

## 2014-07-29 DIAGNOSIS — G4700 Insomnia, unspecified: Secondary | ICD-10-CM

## 2014-07-29 DIAGNOSIS — R7301 Impaired fasting glucose: Secondary | ICD-10-CM | POA: Diagnosis not present

## 2014-07-29 DIAGNOSIS — M810 Age-related osteoporosis without current pathological fracture: Secondary | ICD-10-CM

## 2014-07-29 DIAGNOSIS — F324 Major depressive disorder, single episode, in partial remission: Secondary | ICD-10-CM | POA: Diagnosis not present

## 2014-07-29 DIAGNOSIS — F325 Major depressive disorder, single episode, in full remission: Secondary | ICD-10-CM

## 2014-07-29 DIAGNOSIS — Z853 Personal history of malignant neoplasm of breast: Secondary | ICD-10-CM | POA: Diagnosis not present

## 2014-07-29 LAB — HEPATIC FUNCTION PANEL
ALBUMIN: 4.5 g/dL (ref 3.5–5.2)
ALT: 26 U/L (ref 0–35)
AST: 22 U/L (ref 0–37)
Alkaline Phosphatase: 45 U/L (ref 39–117)
Bilirubin, Direct: 0.1 mg/dL (ref 0.0–0.3)
Indirect Bilirubin: 0.2 mg/dL (ref 0.2–1.2)
Total Bilirubin: 0.3 mg/dL (ref 0.2–1.2)
Total Protein: 6.4 g/dL (ref 6.0–8.3)

## 2014-07-29 LAB — GLUCOSE, RANDOM: Glucose, Bld: 89 mg/dL (ref 70–99)

## 2014-07-29 MED ORDER — SIMVASTATIN 20 MG PO TABS: ORAL_TABLET | ORAL | Status: DC

## 2014-07-29 NOTE — Progress Notes (Signed)
Chief Complaint  Patient presents with  . Med check    fasting med check.    Follow up on right hip pain--naproxen has helped a lot.  Pain was 7-8 initially, now 1-2/10 and tolerable now, "no big deal". She took the naproxen for 13 days--stopped due to GI side effects (pain, heartburn, needing Tums with meals). Pain resolved since stopping the NSAID.  Hypertension follow-up: Blood pressures haven't been checked elsewhere recently. Denies dizziness, headaches, chest pain. Denies side effects of medications, no cough.   Depression: Moods seem to be well controlled on the current dose. Some days feels more down than others, but overall doing okay. Only has about 2-3 bad days/month, better than in the past. Denies SI/HI. Denies side effects of medications.   Hyperlipidemia follow-up: Patient is reportedly following a low-fat, low cholesterol diet. Compliant with medications and denies medication side effects.   Osteoporosis: She is now in a good routine of remembering to take the fosamax weekly. She has some diarrhea that lasts for one day.  Denies heartburn, chest pain or dysphagia (just heartburn when on the naproxen). Denies dysphagia or chest pain. Last DEXA was 11/2010. She has one ordered, but still hasn't scheduled it (but was proud that she finally got her mammogram yesterday!).   H/o breast cancer: She is no longer under the care of oncologist (since Dr. Truddie Fischer left--now is in [place], and she would go up to [facility] to see him if there are any recurrences or problems).She finally got her mammogram yesterday--has not been read yet.  Insomnia: requires Ambien CR nightly. No unusual behavior (keeps phone downstairs, not sending any texts late). It works well for her. She doesn't sleep at all if she doesn't take it, "the brain won't shut up". She will be needing a refill next week.  H/o DM, now just impaired fasting glucose (improved after bariatric surgery and weight loss). She denies polydipsia,  polyuria, vision changes, numbness or tingling.  She is not getting regular exercise outside of work.  PMH, PSH, SH reviewed   Outpatient Encounter Prescriptions as of 07/29/2014  Medication Sig Note  . acetaminophen (TYLENOL) 500 MG tablet Take 1,000 mg by mouth every 6 (six) hours as needed.   Marland Kitchen alendronate (FOSAMAX) 70 MG tablet Take 1 tablet (70 mg total) by mouth every 7 (seven) days. Take with a full glass of water on an empty stomach.   . Calcium Carbonate-Vitamin D (CALTRATE 600+D) 600-400 MG-UNIT per tablet Take 1 tablet by mouth 2 (two) times daily.    . cholecalciferol (VITAMIN D) 1000 UNITS tablet Take 1,000 Units by mouth daily.   01/28/2014: Taking every other day  . fluticasone (FLONASE) 50 MCG/ACT nasal spray Place 2 sprays into both nostrils daily.   Marland Kitchen lisinopril (PRINIVIL,ZESTRIL) 10 MG tablet TAKE 1 TABLET DAILY   . Multiple Vitamins-Minerals (MULTIVITAMIN WITH MINERALS) tablet Take 1 tablet by mouth daily.     . Omega-3 Fatty Acids (FISH OIL) 1200 MG CAPS Take 1 capsule by mouth daily. 2 nightly   . PARoxetine (PAXIL) 40 MG tablet TAKE 1 TABLET EVERY MORNING   . simvastatin (ZOCOR) 20 MG tablet TAKE 1 TABLET AT BEDTIME   . vitamin C (ASCORBIC ACID) 500 MG tablet Take 500 mg by mouth daily.     . vitamin E 400 UNIT capsule Take 400 Units by mouth daily.     Marland Kitchen zolpidem (AMBIEN CR) 12.5 MG CR tablet TAKE 1 TABLET BY MOUTH AT BEDTIME AS NEEDED 07/29/2014: Uses nightly  .  ibuprofen (ADVIL,MOTRIN) 200 MG tablet Take 800 mg by mouth every 8 (eight) hours as needed for pain.    Marland Kitchen Lysine 1000 MG TABS Take 1 tablet by mouth daily as needed. Cold sores 07/04/2011: AS NEEDED  . simethicone (MYLICON) 80 MG chewable tablet Chew 80 mg by mouth as needed.     . valACYclovir (VALTREX) 1000 MG tablet TAKE 2 TABLETS AT ONSET OF COLD SORE AND REPEAT IN 12 HOURS (4 TABLETS PER EPISODE OF COLD SORE) (Patient not taking: Reported on 07/12/2014)   . [DISCONTINUED] naproxen (NAPROSYN) 500 MG tablet  Take 1 tablet (500 mg total) by mouth 2 (two) times daily with a meal. (Patient not taking: Reported on 07/29/2014)    Allergies  Allergen Reactions  . Heparin Other (See Comments)    Low platelets  . Niacin And Related Other (See Comments)    Blotchy/itchy  . Tessalon [Benzonatate] Diarrhea  . Penicillins Rash   ROS:  No fevers, chills, headaches, dizziness, chest pain, shortness of breath, nausea, vomiting, bowel changes, urinary complaints, joint pains (just slight residual in right thigh/hip). No numbness, tingling, weakness, bleeding, bruising, rash.  Moods are good.  No URI symptoms, cough, shortness of breath. See HPI  PHYSICAL EXAM: BP 140/86 mmHg  Pulse 60  Ht 4\' 8"  (1.422 m)  Wt 148 lb 9.6 oz (67.405 kg)  BMI 33.33 kg/m2 140/74 on repeat by MD  Well developed, pleasant overweight female in no distress  HEENT: PERRL, conjunctiva clear. Neck: no lymphadenopathy, thyromegaly or bruit  Heart: regular rate and rhythm without murmur  Lungs clear bilaterally, no wheezes, rales, ronchi Abdomen: soft, nontender, no mass, no organomegaly or mass Extremities: no edema, 2+ pulses Skin: no rash or lesions Psych: normal mood, affect, hygiene and grooming  Neuro: alert and oriented, normal gait  ASSESSMENT/PLAN:  Pure hypercholesterolemia - Plan: Hepatic function panel, simvastatin (ZOCOR) 20 MG tablet  Insomnia - controlled with Ambien CR nightly  Depression, major, in remission  Impaired fasting glucose - Plan: Hemoglobin A1c, Glucose, random  Essential hypertension, benign  Osteoporosis - reminded to schedule DEXA.  Continue fosamax, calcium and D  History of breast cancer - mammogram was just done, results pending  Encouraged regular exercise, weight loss. Continue current medications. Schedule DEXA  F/u 6 months at CPE, sooner prn

## 2014-07-29 NOTE — Patient Instructions (Signed)
Continue your current medications. [facility].  It is recommended that you get at least 30 minutes of aerobic exercise at least 5 days/week (for weight loss, you may need as much as 60-90 minutes). This can be any activity that gets your heart rate up. This can be divided in 10-15 minute intervals if needed, but try and build up your endurance at least once a week.  Weight bearing exercise is also recommended twice weekly.  Try and follow a low sodium diet, and periodically check your blood pressure elsewhere to ensure that it is usually less than 416 systolic (which is borderline high, which is what it was today at the office).

## 2014-07-30 LAB — HEMOGLOBIN A1C
Hgb A1c MFr Bld: 6.1 % — ABNORMAL HIGH (ref <5.7)
Mean Plasma Glucose: 128 mg/dL — ABNORMAL HIGH (ref <117)

## 2014-08-01 ENCOUNTER — Other Ambulatory Visit: Payer: Self-pay | Admitting: Family Medicine

## 2014-08-02 NOTE — Telephone Encounter (Signed)
Ok to refill #90.   

## 2014-08-02 NOTE — Telephone Encounter (Signed)
Is this okay to refill? 

## 2014-08-03 NOTE — Telephone Encounter (Signed)
P.A. Zolpidem approved til 07/28/15, pt informed

## 2014-08-20 ENCOUNTER — Ambulatory Visit
Admission: RE | Admit: 2014-08-20 | Discharge: 2014-08-20 | Disposition: A | Discharge status: Home / Self Care | Payer: 59 | Source: Ambulatory Visit | Attending: Family Medicine | Admitting: Family Medicine

## 2014-08-20 DIAGNOSIS — M810 Age-related osteoporosis without current pathological fracture: Secondary | ICD-10-CM

## 2014-08-20 LAB — HM DEXA SCAN

## 2014-09-01 ENCOUNTER — Encounter: Payer: Self-pay | Admitting: Internal Medicine

## 2014-09-01 ENCOUNTER — Encounter: Payer: Self-pay | Admitting: *Deleted

## 2014-09-08 ENCOUNTER — Other Ambulatory Visit: Payer: Self-pay | Admitting: Family Medicine

## 2014-09-19 ENCOUNTER — Other Ambulatory Visit: Payer: Self-pay | Admitting: Family Medicine

## 2014-10-28 ENCOUNTER — Other Ambulatory Visit: Payer: Self-pay | Admitting: Family Medicine

## 2014-10-29 NOTE — Telephone Encounter (Signed)
Okay to refill #90

## 2014-12-03 ENCOUNTER — Encounter: Payer: Self-pay | Admitting: Internal Medicine

## 2014-12-06 ENCOUNTER — Other Ambulatory Visit: Payer: Self-pay | Admitting: Family Medicine

## 2014-12-18 ENCOUNTER — Other Ambulatory Visit: Payer: Self-pay | Admitting: Family Medicine

## 2015-01-31 ENCOUNTER — Other Ambulatory Visit: Payer: Self-pay | Admitting: Family Medicine

## 2015-01-31 NOTE — Telephone Encounter (Signed)
yes

## 2015-01-31 NOTE — Telephone Encounter (Signed)
She has appointment 9/15 (Thurs)--see if she has enough or can wait

## 2015-01-31 NOTE — Telephone Encounter (Signed)
Sorry, I had called patient and she has two left and does this need called in, cannot wait. Is this ok?

## 2015-01-31 NOTE — Telephone Encounter (Signed)
Is this okay to call in? 

## 2015-02-03 ENCOUNTER — Ambulatory Visit (INDEPENDENT_AMBULATORY_CARE_PROVIDER_SITE_OTHER): Payer: 59 | Admitting: Family Medicine

## 2015-02-03 ENCOUNTER — Encounter: Payer: Self-pay | Admitting: Family Medicine

## 2015-02-03 VITALS — BP 122/72 | HR 68 | Ht <= 58 in | Wt 156.0 lb

## 2015-02-03 DIAGNOSIS — E7800 Pure hypercholesterolemia, unspecified: Secondary | ICD-10-CM

## 2015-02-03 DIAGNOSIS — F324 Major depressive disorder, single episode, in partial remission: Secondary | ICD-10-CM | POA: Diagnosis not present

## 2015-02-03 DIAGNOSIS — R7301 Impaired fasting glucose: Secondary | ICD-10-CM | POA: Diagnosis not present

## 2015-02-03 DIAGNOSIS — B001 Herpesviral vesicular dermatitis: Secondary | ICD-10-CM

## 2015-02-03 DIAGNOSIS — K219 Gastro-esophageal reflux disease without esophagitis: Secondary | ICD-10-CM

## 2015-02-03 DIAGNOSIS — Z9884 Bariatric surgery status: Secondary | ICD-10-CM

## 2015-02-03 DIAGNOSIS — M810 Age-related osteoporosis without current pathological fracture: Secondary | ICD-10-CM

## 2015-02-03 DIAGNOSIS — Z853 Personal history of malignant neoplasm of breast: Secondary | ICD-10-CM

## 2015-02-03 DIAGNOSIS — R109 Unspecified abdominal pain: Secondary | ICD-10-CM

## 2015-02-03 DIAGNOSIS — J309 Allergic rhinitis, unspecified: Secondary | ICD-10-CM

## 2015-02-03 DIAGNOSIS — Z0000 Encounter for general adult medical examination without abnormal findings: Secondary | ICD-10-CM

## 2015-02-03 DIAGNOSIS — G4700 Insomnia, unspecified: Secondary | ICD-10-CM

## 2015-02-03 DIAGNOSIS — N644 Mastodynia: Secondary | ICD-10-CM

## 2015-02-03 DIAGNOSIS — E669 Obesity, unspecified: Secondary | ICD-10-CM | POA: Diagnosis not present

## 2015-02-03 DIAGNOSIS — R10A2 Flank pain, left side: Secondary | ICD-10-CM

## 2015-02-03 DIAGNOSIS — Z23 Encounter for immunization: Secondary | ICD-10-CM

## 2015-02-03 DIAGNOSIS — I10 Essential (primary) hypertension: Secondary | ICD-10-CM | POA: Diagnosis not present

## 2015-02-03 DIAGNOSIS — F325 Major depressive disorder, single episode, in full remission: Secondary | ICD-10-CM

## 2015-02-03 DIAGNOSIS — Z1159 Encounter for screening for other viral diseases: Secondary | ICD-10-CM

## 2015-02-03 DIAGNOSIS — R413 Other amnesia: Secondary | ICD-10-CM

## 2015-02-03 DIAGNOSIS — R635 Abnormal weight gain: Secondary | ICD-10-CM

## 2015-02-03 DIAGNOSIS — Z5181 Encounter for therapeutic drug level monitoring: Secondary | ICD-10-CM

## 2015-02-03 LAB — POCT URINALYSIS DIPSTICK
Bilirubin, UA: NEGATIVE
Blood, UA: NEGATIVE
Glucose, UA: NEGATIVE
Ketones, UA: NEGATIVE
Leukocytes, UA: NEGATIVE
NITRITE UA: NEGATIVE
Protein, UA: NEGATIVE
Spec Grav, UA: 1.025
UROBILINOGEN UA: NEGATIVE
pH, UA: 5.5

## 2015-02-03 MED ORDER — PAROXETINE HCL 40 MG PO TABS: 40.0000 mg | ORAL_TABLET | Freq: Every morning | ORAL | Status: DC

## 2015-02-03 MED ORDER — ALENDRONATE SODIUM 70 MG PO TABS: ORAL_TABLET | ORAL | Status: DC

## 2015-02-03 MED ORDER — LISINOPRIL 10 MG PO TABS: 10.0000 mg | ORAL_TABLET | Freq: Every day | ORAL | Status: DC

## 2015-02-03 MED ORDER — SIMVASTATIN 20 MG PO TABS: 20.0000 mg | ORAL_TABLET | Freq: Every day | ORAL | Status: DC

## 2015-02-03 MED ORDER — VALACYCLOVIR HCL 1 G PO TABS: ORAL_TABLET | ORAL | Status: DC

## 2015-02-03 NOTE — Progress Notes (Signed)
Chief Complaint  Patient presents with  . Annual Exam    fasting (had coffee with cream this am) with pelvic/med check. Did not do eye exam, had one two weeks ago with Dr.Martinek. Is having some left sided mid to low back pain x 1 week. Is also having some memory problems.    Julie Fischer is a 54 y.o. female who presents for a complete physical.  She has the following concerns:  "I think I have a kidney infection".  She has been having a pain like a fist was stuck in her left flank for the last week.  Last night the pain became sharper, more like a "stiletto".  She has some urinary urgency, small amounts, slightly strong odor. No dysuria, no hematuria, fevers, chills.  She has been doing some bending, thinks pain is more constant rather than positional.  She took some Bayer Back and Body with some improvement, and Tylenol.  Having more frequent heartburn than usual--2x/week, not related to a particular food, usually at the end of the day.  Using Tums with relief.  She is complaining of feeling tired a lot.  Denies feeling unrefreshed in the morning. She has a h/o sleep apnea, prior to her bariatric surgery.  She also is complaining of memory concerns.  She is losing things--phone, remote control, papers. Has missed turns from spacing out while driving, but not really getting lost.  Some trouble with names, no trouble with word finding. Not significant issues have occurred.  Hypertension follow-up: Blood pressures at home run about 120-130's/70's-80. Denies dizziness, headaches, chest pain. Denies side effects of medications, no cough.   Depression: Moods seem to be well controlled on the current dose. Some days feels more down than others, but overall doing okay. Only has about 2-3 bad days/month, better than in the past.  This remains the same as 6 months ago, no changes. Denies SI/HI. Denies side effects of medications.   Hyperlipidemia follow-up: Patient is reportedly following a low-fat,  low cholesterol diet. Compliant with medications and denies medication side effects.   Osteoporosis: She is compliant with taking fosamax weekly. She has some diarrhea that lasts for one day. Denies chest pain or dysphagia, occasional heartburn not related to the medication.  Last DEXA was 08/2014 which showed T-2.0 at spine, -1.6 at left hip.  10% improvement of L hip bone density, and 11% increase in the spine.  H/o breast cancer: She is no longer under the care of oncologist (since Dr. Truddie Coco left--now is in [place], and she would go up to [facility] to see him if there are any recurrences or problems). Last mammogram was in March.  She checks her breasts regularly.  She has some tenderness around her scar, slightly more than usual.  Insomnia: requires Ambien CR nightly. No unusual behavior (keeps phone downstairs, not sending any texts late). It works well for her. She doesn't sleep at all if she doesn't take it, "the brain won't shut up". She tried not taking it, and using her husband's CPAP (which used to be effective in helping her fall asleep, when she had sleep apnea) but she still couldn't sleep.  H/o DM, now just impaired fasting glucose (improved after bariatric surgery and weight loss). She denies polydipsia, polyuria, vision changes, numbness or tingling. She is not getting regular exercise outside of work. Plans to start walking the dogs each evening  Immunization History  Administered Date(s) Administered  . Influenza Split 02/07/2009, 04/10/2010, 04/19/2011, 01/28/2012  . Influenza,inj,Quad PF,36+  Mos 01/28/2013, 01/28/2014, 02/03/2015  . Pneumococcal Polysaccharide-23 06/04/2001, 11/15/2010  . Td 06/04/2001  . Tdap 11/15/2010   Last Pap smear: s/p hysterectomy  Last mammogram: 07/2014  Last colonoscopy: 12/2010  Last DEXA: 08/2014 T-2.0 at spine, -1.6 at L hip (significantly improved from 2012). Ophtho: yearly, Last went a couple of weeks ago Dentist: twice yearly  Exercise:  nothing outside of work.  Past Medical History  Diagnosis Date  . Breast cancer 2001    R breast(lumpectomy,chemo,radiation,tamoxifen)  . Hypertension     resolved after weight loss surgery; recurred 2013  . Diabetes mellitus     resolved after weight loss surgery  . Microalbuminuria     h/o  . Iron deficiency     h/o  . Depression   . Anxiety   . Sleep apnea     resolved after weight loss surgery  . Osteoporosis   . Plantar fasciitis (07') DrRegal    s/p surgical release 01/2011  . Vitamin D deficiency   . OA (osteoarthritis) of knee   . Insomnia chronic  . Allergy     fall seasonal  . Blood transfusion     Platelet transfusion when on heparin  . Hyperlipidemia     Past Surgical History  Procedure Laterality Date  . Breast lumpectomy Right  11/1999  . Abdominal hysterectomy  2000    and RSO(endometriosis)  . Appendectomy    . Cuboid stress fracture Right 12/2002  . Left shoulder fracture repair  (DrMortensen) 12/05  . 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 Myringotomy      tubes B/L, multiple sets  . Foot surgery Left 2008  . Colonoscopy  2002, 2012  . Tonsillectomy and adenoidectomy    . Plantar fasciitis release Right 01/30/11    Dr. Paulla Dolly    Social History   Social History  . Marital Status: Married    Spouse Name: N/A  . Number of Children: N/A  . Years of Education: N/A   Occupational History  . Not on file.   Social History Main Topics  . Smoking status: Former Smoker    Quit date: 01/28/1996  . Smokeless tobacco: Never Used  . Alcohol Use: Yes     Comment: glass of wine or beer maybe once a month.  . Drug Use: No  . Sexual Activity:    Partners: Male   Other Topics Concern  . Not on file   Social History Narrative   Works at [employer], as a copy and Programme researcher, broadcasting/film/video.  Lives with husband, 3 dogs, 1 cat    Family History  Problem Relation Age of Onset  . Dementia Mother   . Stroke Mother 66    due to aneurysm  . Macular  degeneration Mother   . Cancer Mother     colon cancer  . Colon cancer Mother   . Pancreatic cancer Father   . Cancer Father   . Hyperlipidemia Sister   . Hyperlipidemia Brother   . Diabetes Brother   . Hypertension Brother   . Diabetes Maternal Grandmother   . Hyperlipidemia Sister   . Breast cancer Cousin   . Breast cancer Cousin     Outpatient Encounter Prescriptions as of 02/03/2015  Medication Sig Note  . alendronate (FOSAMAX) 70 MG tablet TAKE 1 TABLET EVERY SEVEN DAYS WITH A FULL GLASS OF WATER ON AN EMPTY STOMACH   . Calcium Carbonate-Vitamin D (CALTRATE 600+D) 600-400 MG-UNIT per tablet Take 1 tablet by mouth 2 (two)  times daily.    . cholecalciferol (VITAMIN D) 1000 UNITS tablet Take 1,000 Units by mouth daily.   01/28/2014: Taking every other day  . lisinopril (PRINIVIL,ZESTRIL) 10 MG tablet Take 1 tablet (10 mg total) by mouth daily.   . Multiple Vitamins-Minerals (MULTIVITAMIN WITH MINERALS) tablet Take 1 tablet by mouth daily.     . Omega-3 Fatty Acids (FISH OIL) 1200 MG CAPS Take 1 capsule by mouth daily. 2 nightly   . PARoxetine (PAXIL) 40 MG tablet Take 1 tablet (40 mg total) by mouth every morning.   . RESTASIS 0.05 % ophthalmic emulsion INSTILL 1 DROP IN [facility] EYE 2 TIMES DAILY 02/03/2015: Received from: External Pharmacy  . simvastatin (ZOCOR) 20 MG tablet Take 1 tablet (20 mg total) by mouth at bedtime.   . vitamin C (ASCORBIC ACID) 500 MG tablet Take 500 mg by mouth daily.     . vitamin E 400 UNIT capsule Take 400 Units by mouth daily.     Marland Kitchen zolpidem (AMBIEN CR) 12.5 MG CR tablet TAKE 1 TABLET BY MOUTH AT BEDTIME AS NEEDED   . [DISCONTINUED] alendronate (FOSAMAX) 70 MG tablet TAKE 1 TABLET EVERY SEVEN DAYS WITH A FULL GLASS OF WATER ON AN EMPTY STOMACH   . [DISCONTINUED] fluticasone (FLONASE) 50 MCG/ACT nasal spray Place 2 sprays into both nostrils daily.   . [DISCONTINUED] lisinopril (PRINIVIL,ZESTRIL) 10 MG tablet TAKE 1 TABLET DAILY   . [DISCONTINUED] PARoxetine  (PAXIL) 40 MG tablet TAKE 1 TABLET EVERY MORNING   . [DISCONTINUED] simvastatin (ZOCOR) 20 MG tablet TAKE 1 TABLET AT BEDTIME   . acetaminophen (TYLENOL) 500 MG tablet Take 1,000 mg by mouth every 6 (six) hours as needed.   Marland Kitchen ibuprofen (ADVIL,MOTRIN) 200 MG tablet Take 800 mg by mouth every 8 (eight) hours as needed for pain.    Marland Kitchen Lysine 1000 MG TABS Take 1 tablet by mouth daily as needed. Cold sores 07/04/2011: AS NEEDED  . simethicone (MYLICON) 80 MG chewable tablet Chew 80 mg by mouth as needed.     . valACYclovir (VALTREX) 1000 MG tablet TAKE 2 TABLETS AT ONSET OF COLD SORE AND REPEAT IN 12 HOURS (4 TABLETS PER EPISODE OF COLD SORE)   . [DISCONTINUED] valACYclovir (VALTREX) 1000 MG tablet TAKE 2 TABLETS AT ONSET OF COLD SORE AND REPEAT IN 12 HOURS (4 TABLETS PER EPISODE OF COLD SORE) (Patient not taking: Reported on 07/12/2014)    No facility-administered encounter medications on file as of 02/03/2015.    Allergies  Allergen Reactions  . Heparin Other (See Comments)    Low platelets  . Niacin And Related Other (See Comments)    Blotchy/itchy  . Tessalon [Benzonatate] Diarrhea  . Penicillins Rash    ROS: The patient denies anorexia, fever, headaches, vision changes, decreased hearing, ear pain, sore throat, chest pain, palpitations, dizziness, syncope, dyspnea on exertion, cough, swelling, nausea, vomiting, constipation, abdominal pain, melena, hematochezia, hematuria, incontinence, dysuria, vaginal bleeding, discharge, odor or itch, genital lesions, numbness, tingling, weakness, tremor, suspicious skin lesions, abnormal bleeding/bruising, or enlarged lymph nodes.  Chronic insomnia. Moods well controlled as per HPI. Some right knee pain, only infrequent hip pain. No longer having feet pain. See HPI--indigestion, back pain, slight urinary complaints. Some postnasal drainage with sore throat at night. +weight gain 8# in the last year. Tenderness around scar at right breast.  PHYSICAL  EXAM:  BP 122/72 mmHg  Pulse 68  Ht 4' 9.5" (1.461 m)  Wt 156 lb (70.761 kg)  BMI 33.15 kg/m2  General Appearance:  Alert, cooperative, no distress, appears stated age   Head:  Normocephalic, without obvious abnormality, atraumatic   Eyes:  PERRL, conjunctiva/corneas clear, EOM's intact, fundi  benign   Ears:  Normal TM's and external ear canals. Blue PE tube noted in right ear   Nose:  Nasal mucosa mildly edematous. Sinuses nontender   Throat:  Lips, mucosa, and tongue normal; teeth and gums normal   Neck:  Supple, no lymphadenopathy; thyroid: no enlargement/tenderness/nodules; no carotid  bruit or JVD   Back:  Spine nontender, no curvature, ROM normal. No muscle spasm. nontender to palpation over left flank and paraspinous muscles, but was tender to percussion over the L CVA.  Lungs:  Clear to auscultation bilaterally without wheezes, rales or ronchi; respirations unlabored   Chest Wall:  No tenderness or deformity   Heart:  Regular rate and rhythm, S1 and S2 normal, no murmur, rub  or gallop   Breast Exam:  R breast--s/p surgery with scarring and some skin changes related to previous radiation. No focal mass. No nipple discharge or inversion. L breast exam is entirely normal. No axillary lymphadenopathy. She is diffusely tender over the right scarred area in right breast.  Abdomen:  Soft, nondistended, normoactive bowel sounds,  no masses, no hepatosplenomegaly   Genitalia:  Normal external genitalia without lesions. BUS and vagina normal; Bimanual exam revealed surgically absent uterus. No adnexal masses. No mass, rebound tenderness or guarding   Rectal:  Normal tone, no masses or tenderness; guaiac negative stool   Extremities:  No clubbing, cyanosis or edema. No lesions.  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 Discussed Belsomra; no need to change at this point  Impaired fasting glucose - previously diabetic, prior to wt loss surgery. Gaining weight, at risk for increasing sugars - Plan: Comprehensive metabolic panel, Hemoglobin A1c  Essential hypertension, benign - controlled - Plan: lisinopril (PRINIVIL,ZESTRIL) 10 MG tablet  Osteoporosis - improving.  continue medication, Ca and D - Plan: alendronate (FOSAMAX) 70 MG tablet  Allergic rhinitis, unspecified allergic rhinitis type  History of breast cancer - some increased tenderness over scar.  send for diagnostic mammo/u/s. consider MRI - Plan: Comprehensive metabolic panel, CBC with Differential/Platelet, MM Digital Diagnostic Unilat R, US BREAST COMPLETE UNI RIGHT INC AXILLA  Obesity (BMI 30-39.9)  History of bariatric surgery - Plan: Lipid panel, Comprehensive metabolic panel, CBC with Differential/Platelet, TSH, Hemoglobin A1c, Vitamin B12  Herpes labialis - Plan: valACYclovir (VALTREX) 1000 MG tablet  Need for hepatitis C screening test - Plan: Hepatitis C antibody  Memory change - check labs; history is not particularly concerning. discussed routines/lists - Plan: TSH, Vitamin B12  Weight gain - discussed proper  diet, exercise, and need for weight loss - Plan: TSH  Medication monitoring encounter - Plan: Lipid panel, Comprehensive metabolic panel, CBC with Differential/Platelet  Left flank pain - nontender over muscles, tender w/percussion. normal urine dip, but r/o infection. heat, stretches, NSAID's prn - Plan: Urine culture  Gastroesophageal reflux disease without esophagitis - reviewed diet/behaviors, OTC meds. Weight loss encouraged  Pain of right breast - Plan: MM Digital Diagnostic Unilat R, US BREAST COMPLETE UNI RIGHT INC AXILLA   Fatigue--consider re-evaluating for OSA if energy isn't improved with starting a regular exercise routine, and if labs are normal.  Flank pain--send culture. May need further eval with imaging if not improving. Heat/massage and NSAID.  Contact us if not improving.  Discussed monthly self breast exams and yearly mammograms (referring for diagnostic and u/s of R breast due to pain); at least 30 minutes of aerobic activity at least 5 days/week, weight-bearing exercise at least 2x/wk; proper sunscreen use reviewed; healthy diet, including goals of calcium and vitamin D intake and alcohol recommendations (less than or equal to 1 drink/day) reviewed; regular seatbelt use; changing batteries in smoke detectors. Immunization recommendations discussed--UTD, flu shot given. Colonoscopy recommendations reviewed, UTD --due again in 2017 (5 year f/u) due to family h/o colon cancer (mother).  F/u 6 months for med check, sooner prn.

## 2015-02-03 NOTE — Patient Instructions (Addendum)
  HEALTH MAINTENANCE RECOMMENDATIONS:  It is recommended that you get at least 30 minutes of aerobic exercise at least 5 days/week (for weight loss, you may need as much as 60-90 minutes). This can be any activity that gets your heart rate up. This can be divided in 10-15 minute intervals if needed, but try and build up your endurance at least once a week.  Weight bearing exercise is also recommended twice weekly.  Eat a healthy diet with lots of vegetables, fruits and fiber.  "Colorful" foods have a lot of vitamins (ie green vegetables, tomatoes, red peppers, etc).  Limit sweet tea, regular sodas and alcoholic beverages, all of which has a lot of calories and sugar.  Up to 1 alcoholic drink daily may be beneficial for women (unless trying to lose weight, watch sugars).  Drink a lot of water.  Calcium recommendations are 1200-1500 mg daily (1500 mg for postmenopausal women or women without ovaries), and vitamin D 1000 IU daily.  This should be obtained from diet and/or supplements (vitamins), and calcium should not be taken all at once, but in divided doses.  Monthly self breast exams and yearly mammograms for women over the age of 76 is recommended.  Sunscreen of at least SPF 30 should be used on all sun-exposed parts of the skin when outside between the hours of 10 am and 4 pm (not just when at beach or pool, but even with exercise, golf, tennis, and yard work!)  Use a sunscreen that says "broad spectrum" so it covers both UVA and UVB rays, and make sure to reapply every 1-2 hours.  Remember to change the batteries in your smoke detectors when changing your clock times in the spring and fall.  Use your seat belt every time you are in a car, and please drive safely and not be distracted with cell phones and texting while driving.  Heat/massage and anti-inflammatory (aleve, ibuprofen). Contact us if not improving. We may need to evaluate with imaging procedure if pain persists/worsens.

## 2015-02-04 ENCOUNTER — Telehealth: Payer: Self-pay

## 2015-02-04 LAB — CBC WITH DIFFERENTIAL/PLATELET
BASOS ABS: 0.1 10*3/uL (ref 0.0–0.1)
Basophils Relative: 1 % (ref 0–1)
EOS ABS: 0.3 10*3/uL (ref 0.0–0.7)
Eosinophils Relative: 4 % (ref 0–5)
HCT: 38.9 % (ref 36.0–46.0)
Hemoglobin: 13.2 g/dL (ref 12.0–15.0)
LYMPHS ABS: 2.7 10*3/uL (ref 0.7–4.0)
LYMPHS PCT: 42 % (ref 12–46)
MCH: 29.9 pg (ref 26.0–34.0)
MCHC: 33.9 g/dL (ref 30.0–36.0)
MCV: 88 fL (ref 78.0–100.0)
MPV: 9.2 fL (ref 8.6–12.4)
Monocytes Absolute: 0.5 10*3/uL (ref 0.1–1.0)
Monocytes Relative: 7 % (ref 3–12)
NEUTROS PCT: 46 % (ref 43–77)
Neutro Abs: 3 10*3/uL (ref 1.7–7.7)
PLATELETS: 210 10*3/uL (ref 150–400)
RBC: 4.42 MIL/uL (ref 3.87–5.11)
RDW: 14.1 % (ref 11.5–15.5)
WBC: 6.5 10*3/uL (ref 4.0–10.5)

## 2015-02-04 LAB — LIPID PANEL
CHOL/HDL RATIO: 3.2 ratio (ref <=5.0)
CHOLESTEROL: 162 mg/dL (ref 125–200)
HDL: 50 mg/dL (ref >=46)
LDL Cholesterol: 82 mg/dL (ref <130)
TRIGLYCERIDES: 149 mg/dL (ref <150)
VLDL: 30 mg/dL (ref <30)

## 2015-02-04 LAB — COMPREHENSIVE METABOLIC PANEL
ALBUMIN: 4.5 g/dL (ref 3.6–5.1)
ALT: 39 U/L — ABNORMAL HIGH (ref 6–29)
AST: 35 U/L (ref 10–35)
Alkaline Phosphatase: 45 U/L (ref 33–130)
BILIRUBIN TOTAL: 0.3 mg/dL (ref 0.2–1.2)
BUN: 13 mg/dL (ref 7–25)
CALCIUM: 9.5 mg/dL (ref 8.6–10.4)
CHLORIDE: 102 mmol/L (ref 98–110)
CO2: 26 mmol/L (ref 20–31)
Creat: 0.44 mg/dL — ABNORMAL LOW (ref 0.50–1.05)
Glucose, Bld: 102 mg/dL — ABNORMAL HIGH (ref 65–99)
POTASSIUM: 4.2 mmol/L (ref 3.5–5.3)
Sodium: 139 mmol/L (ref 135–146)
Total Protein: 6.7 g/dL (ref 6.1–8.1)

## 2015-02-04 LAB — VITAMIN B12: VITAMIN B 12: 702 pg/mL (ref 211–911)

## 2015-02-04 LAB — HEMOGLOBIN A1C
Hgb A1c MFr Bld: 6.6 % — ABNORMAL HIGH (ref <5.7)
MEAN PLASMA GLUCOSE: 143 mg/dL — AB (ref <117)

## 2015-02-04 LAB — TSH: TSH: 0.66 u[IU]/mL (ref 0.350–4.500)

## 2015-02-04 LAB — HEPATITIS C ANTIBODY: HCV Ab: NEGATIVE

## 2015-02-04 NOTE — Telephone Encounter (Signed)
CALLED PT INFORMED HER OF HER APPOINTMENT 02/10/15 AT 10:30 PT VERBALIZED UNDERSTANDING

## 2015-02-04 NOTE — Progress Notes (Signed)
I HAVE CALLED MADE APPOINTMENT CALLED TO INFORM PT

## 2015-02-05 LAB — URINE CULTURE
COLONY COUNT: NO GROWTH
ORGANISM ID, BACTERIA: NO GROWTH

## 2015-02-10 ENCOUNTER — Ambulatory Visit
Admission: RE | Admit: 2015-02-10 | Discharge: 2015-02-10 | Disposition: A | Discharge status: Home / Self Care | Payer: 59 | Source: Ambulatory Visit | Attending: Family Medicine | Admitting: Family Medicine

## 2015-02-10 DIAGNOSIS — N644 Mastodynia: Secondary | ICD-10-CM

## 2015-02-10 DIAGNOSIS — Z853 Personal history of malignant neoplasm of breast: Secondary | ICD-10-CM

## 2015-02-17 ENCOUNTER — Encounter: Payer: Self-pay | Admitting: Family Medicine

## 2015-04-28 ENCOUNTER — Other Ambulatory Visit: Payer: Self-pay | Admitting: Family Medicine

## 2015-04-29 MED ORDER — ZOLPIDEM TARTRATE ER 12.5 MG PO TBCR: 12.5000 mg | EXTENDED_RELEASE_TABLET | Freq: Every evening | ORAL | Status: DC | PRN

## 2015-04-29 NOTE — Telephone Encounter (Signed)
Phoned in.

## 2015-04-29 NOTE — Telephone Encounter (Signed)
Is this ok to refill?  

## 2015-04-29 NOTE — Telephone Encounter (Signed)
Ok to refill #90.   

## 2015-04-29 NOTE — Addendum Note (Signed)
Addended by: Billie Lade on: 04/29/2015 12:20 PM   Modules accepted: Orders

## 2015-05-17 ENCOUNTER — Other Ambulatory Visit: Payer: Self-pay | Admitting: Family Medicine

## 2015-07-16 ENCOUNTER — Telehealth: Payer: Self-pay | Admitting: Family Medicine

## 2015-07-26 ENCOUNTER — Other Ambulatory Visit: Payer: Self-pay | Admitting: Family Medicine

## 2015-07-27 NOTE — Telephone Encounter (Signed)
Yes.  Okay to refill #90

## 2015-07-27 NOTE — Telephone Encounter (Signed)
Is this okay to refill? Spoke with patient, she has an appt next Wed but only has 2 pills left.

## 2015-08-03 ENCOUNTER — Encounter: Payer: Self-pay | Admitting: Family Medicine

## 2015-08-03 ENCOUNTER — Ambulatory Visit (INDEPENDENT_AMBULATORY_CARE_PROVIDER_SITE_OTHER): Payer: 59 | Admitting: Family Medicine

## 2015-08-03 VITALS — BP 110/70 | HR 76 | Ht <= 58 in | Wt 152.0 lb

## 2015-08-03 DIAGNOSIS — E7800 Pure hypercholesterolemia, unspecified: Secondary | ICD-10-CM

## 2015-08-03 DIAGNOSIS — Z87898 Personal history of other specified conditions: Secondary | ICD-10-CM

## 2015-08-03 DIAGNOSIS — I10 Essential (primary) hypertension: Secondary | ICD-10-CM | POA: Diagnosis not present

## 2015-08-03 DIAGNOSIS — K5900 Constipation, unspecified: Secondary | ICD-10-CM

## 2015-08-03 DIAGNOSIS — E669 Obesity, unspecified: Secondary | ICD-10-CM | POA: Diagnosis not present

## 2015-08-03 DIAGNOSIS — Z8669 Personal history of other diseases of the nervous system and sense organs: Secondary | ICD-10-CM

## 2015-08-03 DIAGNOSIS — E119 Type 2 diabetes mellitus without complications: Secondary | ICD-10-CM | POA: Diagnosis not present

## 2015-08-03 DIAGNOSIS — G478 Other sleep disorders: Secondary | ICD-10-CM | POA: Diagnosis not present

## 2015-08-03 DIAGNOSIS — Z9884 Bariatric surgery status: Secondary | ICD-10-CM | POA: Diagnosis not present

## 2015-08-03 DIAGNOSIS — G4700 Insomnia, unspecified: Secondary | ICD-10-CM | POA: Diagnosis not present

## 2015-08-03 DIAGNOSIS — R7301 Impaired fasting glucose: Secondary | ICD-10-CM | POA: Diagnosis not present

## 2015-08-03 DIAGNOSIS — Z5181 Encounter for therapeutic drug level monitoring: Secondary | ICD-10-CM

## 2015-08-03 LAB — POCT GLYCOSYLATED HEMOGLOBIN (HGB A1C): HEMOGLOBIN A1C: 6.4

## 2015-08-03 MED ORDER — SIMVASTATIN 20 MG PO TABS: 20.0000 mg | ORAL_TABLET | Freq: Every day | ORAL | Status: DC

## 2015-08-03 NOTE — Progress Notes (Signed)
Chief Complaint  Patient presents with  . Hypertension    fasting med check.   "I can't poop!"-- problems with constipation for the last 2 months.  She had been having loose stools almost daily prior to that.  Denies changes in diet, supplements.  Having bowel movement every 3-4 days. Stools are hard, painful, +straining.  She started taking gummy fiber supplement, but doesn't drink water with it.  Drinks 48-50 ounces of water daily.  She continues to feel fatigued, waking up unrefreshed.  She reports snoring "only a little bit", not like she used to.  She has a history of sleep apnea, treated with CPAP, prior to bariatric surgery.  She has regained some of the weight that she had lost.  Hypertension follow-up: Hasn't been checking blood pressures at home recently, had all been normal. Denies dizziness, headaches, chest pain. Denies side effects of medications, no cough.   Depression: Moods seem to be well controlled on the current dose. Some days feels more down than others, but overall doing okay. Only has about 2-3 bad days/month, better than in the past. She lost her cat a couple of weeks ago, so moods worse recently, but doing okay overall. Denies SI/HI. Denies side effects of medications.   Hyperlipidemia follow-up: Patient is reportedly following a low-fat, low cholesterol diet. Compliant with medications and denies medication side effects.   Osteoporosis: She is compliant with taking fosamax weekly. She hasn't been getting the diarrhea after taking it, which used to last for one day. Denies chest pain or dysphagia, occasional heartburn not related to the medication. Last DEXA was 08/2014 which showed T-2.0 at spine, -1.6 at left hip. 10% improvement of L hip bone density, and 11% increase in the spine.  Insomnia: requires Ambien CR nightly. No unusual behavior (keeps phone downstairs, not sending any texts late). It works well for her. She doesn't sleep at all if she doesn't take it,  "the brain won't shut up".  H/o DM, changed to impaired fasting glucose (improved after bariatric surgery and weight loss). She had regained weight and last A1c was up to 6.6% in September 2016. She denies polydipsia, polyuria, vision changes, numbness or tingling. She still isn't getting any regular exercise. She lost 4# since her last visit 6 months ago. Last eye exam was a month ago.  No changes noted to memory, same as per last visit ( losing things--phone, remote control, papers. Has missed turns from spacing out while driving, but not really getting lost. Some trouble with names, no trouble with word finding). No significant issues have occurred.  PMH, PSH, SH reviewed  Outpatient Encounter Prescriptions as of 08/03/2015  Medication Sig Note  . alendronate (FOSAMAX) 70 MG tablet TAKE 1 TABLET EVERY SEVEN DAYS WITH A FULL GLASS OF WATER ON AN EMPTY STOMACH   . Calcium Carbonate-Vitamin D (CALTRATE 600+D) 600-400 MG-UNIT per tablet Take 1 tablet by mouth 2 (two) times daily.    . cholecalciferol (VITAMIN D) 1000 UNITS tablet Take 1,000 Units by mouth daily.   01/28/2014: Taking every other day  . lisinopril (PRINIVIL,ZESTRIL) 10 MG tablet Take 1 tablet (10 mg total) by mouth daily.   . Multiple Vitamins-Minerals (MULTIVITAMIN WITH MINERALS) tablet Take 1 tablet by mouth daily.     . Omega-3 Fatty Acids (FISH OIL) 1200 MG CAPS Take 1 capsule by mouth daily. 2 nightly   . PARoxetine (PAXIL) 40 MG tablet Take 1 tablet (40 mg total) by mouth every morning.   . RESTASIS 0.05 %  ophthalmic emulsion INSTILL 1 DROP IN [facility] E[facility] 2 TIMES DAILY 02/03/2015: Received from: External Pharmacy  . simethicone (MYLICON) 80 MG chewable tablet Chew 80 mg by mouth as needed.     . simvastatin (ZOCOR) 20 MG tablet Take 1 tablet (20 mg total) by mouth at bedtime.   . valACYclovir (VALTREX) 1000 MG tablet TAKE 2 TABLETS AT ONSET OF COLD SORE AND REPEAT IN 12 HOURS (4 TABLETS PER EPISODE OF COLD SORE)   . vitamin C  (ASCORBIC ACID) 500 MG tablet Take 500 mg by mouth daily.     . vitamin E 400 UNIT capsule Take 400 Units by mouth daily.     Marland Kitchen zolpidem (AMBIEN CR) 12.5 MG CR tablet take 1 tablet by mouth at bedtime if needed   . acetaminophen (TYLENOL) 500 MG tablet Take 1,000 mg by mouth every 6 (six) hours as needed. Reported on 08/03/2015   . ibuprofen (ADVIL,MOTRIN) 200 MG tablet Take 800 mg by mouth every 8 (eight) hours as needed for pain. Reported on 08/03/2015   . Lysine 1000 MG TABS Take 1 tablet by mouth daily as needed. Reported on 08/03/2015 07/04/2011: AS NEEDED  . [DISCONTINUED] valACYclovir (VALTREX) 1000 MG tablet TAKE 2 TABLETS AT ONSET OF COLD SORE AND REPEAT IN 12 HOURS (4 TABLETS PER EPISODE OF COLD SORE)    No facility-administered encounter medications on file as of 08/03/2015.   Allergies  Allergen Reactions  . Heparin Other (See Comments)    Low platelets  . Niacin And Related Other (See Comments)    Blotchy/itchy  . Tessalon [Benzonatate] Diarrhea  . Penicillins Rash   ROS: no fever, chills, headaches, dizziness, chest pain, palpitations, DOE/SOB. Denies nausea, vomiting, reflux, abdominal pain.  +constipation as per HPI.  No urinary complaints, bleeding, bruising, rash.  Moods are well controlled.  Insomnia controlled with daily ambien CR. See HPI  PHYSICAL EXAM: BP 110/70 mmHg  Pulse 76  Ht 4' 9.5" (1.461 m)  Wt 152 lb (68.947 kg)  BMI 32.30 kg/m2  Well developed, pleasant overweight female in no distress  HEENT: PERRL, conjunctiva and sclera are clear. OP clear Neck: no lymphadenopathy, thyromegaly or bruit  Heart: regular rate and rhythm without murmur  Lungs clear bilaterally, no wheezes, rales, ronchi Abdomen: soft, nontender, no mass, no organomegaly or mass Extremities: no edema, 2+ pulses Skin: no rash or lesions Psych: normal mood, affect, hygiene and grooming  Neuro: alert and oriented, normal gait   Lab Results  Component Value Date   HGBA1C 6.4  08/03/2015   ASSESSMENT/PLAN:  Essential hypertension, benign - controlled - Plan: Comprehensive metabolic panel  Pure hypercholesterolemia - Plan: Comprehensive metabolic panel, simvastatin (ZOCOR) 20 MG tablet  Type 2 diabetes mellitus without complication, without long-term current use of insulin (HCC) - recurrence of diabetes (had been just IFG after bariatric surgery; last A1c back up to 6.6, giving her back the dx of DM) - Plan: Comprehensive metabolic panel  Insomnia - stable/controlled, requiring daily Ambien CR  IFG (impaired fasting glucose) - as per last A1c 6 months ago, now carries dx of DM again (was 6.6); improved today. Encouraged daily exercise, weight loss. Diet controlled. - Plan: HgB A1c  Constipation, unspecified constipation type - counseled re: high fiber diet, increased water intake, exercise, and otc meds (colace, Miralax)  Medication monitoring encounter  Unrefreshed by sleep - Plan: Split night study  History of sleep apnea - resolved s/p bariatric surgery.  Denies significant snoring, but recurrent symptoms otherwise, refer for  sleep study - Plan: Split night study  Obesity, Class I, BMI 30-34.9 - encouraged regular exercise, healthy diet, weight loss - Plan: Split night study   CPE 6 mos with fasting labs prior. c-met, lipid, CBC, TSH, A1c, Vit D, B12   Be sure to let the eye doctor know that you have diabetes so that they do a complete annual diabetic exam and ask them to forward me the notes.  Constipation: Be sure to drink at least 6-8 glasses of water daily; definitely take more fluid when taking any fiber supplements. You can start a stool softener daily such as colace (docusate sodium)--take 2 daily.  Cut back if stools become loose. Try and get regular exercise. If these measures aren't effective, you can use a laxative sporadically as needed, OR you can start using Miralax regularly.  You can use miralax once daily until you get good results,  then back down for maintenance.  Some people can take 1/2 capful daily, others only need it once or twice a week--depends on your response.

## 2015-08-03 NOTE — Patient Instructions (Signed)
Be sure to let the eye doctor know that you have diabetes so that they do a complete annual diabetic exam and ask them to forward me the notes.  We are referring you for a sleep study. Let us know if you don't hear from anyone.  I referred you to [facility].  If your insurance doesn't cover this, we may need to switch to an in-home study.   Constipation: Be sure to drink at least 6-8 glasses of water daily; definitely take more fluid when taking any fiber supplements. You can start a stool softener daily such as colace (docusate sodium)--take 2 daily.  Cut back if stools become loose. Try and get regular exercise. If these measures aren't effective, you can use a laxative sporadically as needed, OR you can start using Miralax regularly.  You can use miralax once daily until you get good results, then back down for maintenance.  Some people can take 1/2 capful daily, others only need it once or twice a week--depends on your response.   Constipation, Adult Constipation is when a person has fewer than three bowel movements a week, has difficulty having a bowel movement, or has stools that are dry, hard, or larger than normal. As people grow older, constipation is more common. A low-fiber diet, not taking in enough fluids, and taking certain medicines may make constipation worse.  CAUSES   Certain medicines, such as antidepressants, pain medicine, iron supplements, antacids, and water pills.   Certain diseases, such as diabetes, irritable bowel syndrome (IBS), thyroid disease, or depression.   Not drinking enough water.   Not eating enough fiber-rich foods.   Stress or travel.   Lack of physical activity or exercise.   Ignoring the urge to have a bowel movement.   Using laxatives too much.  SIGNS AND SYMPTOMS   Having fewer than three bowel movements a week.   Straining to have a bowel movement.   Having stools that are hard, dry, or larger than normal.    Feeling full or bloated.   Pain in the lower abdomen.   Not feeling relief after having a bowel movement.  DIAGNOSIS  Your health care provider will take a medical history and perform a physical exam. Further testing may be done for severe constipation. Some tests may include:  A barium enema X-ray to examine your rectum, colon, and, sometimes, your small intestine.   A sigmoidoscopy to examine your lower colon.   A colonoscopy to examine your entire colon. TREATMENT  Treatment will depend on the severity of your constipation and what is causing it. Some dietary treatments include drinking more fluids and eating more fiber-rich foods. Lifestyle treatments may include regular exercise. If these diet and lifestyle recommendations do not help, your health care provider may recommend taking over-the-counter laxative medicines to help you have bowel movements. Prescription medicines may be prescribed if over-the-counter medicines do not work.  HOME CARE INSTRUCTIONS   Eat foods that have a lot of fiber, such as fruits, vegetables, whole grains, and beans.  Limit foods high in fat and processed sugars, such as french fries, hamburgers, cookies, candies, and soda.   A fiber supplement may be added to your diet if you cannot get enough fiber from foods.   Drink enough fluids to keep your urine clear or pale yellow.   Exercise regularly or as directed by your health care provider.   Go to the restroom when you have the urge to go. Do not hold  it.   Only take over-the-counter or prescription medicines as directed by your health care provider. Do not take other medicines for constipation without talking to your health care provider first.  [place] IF:   You have bright red blood in your stool.   Your constipation lasts for more than 4 days or gets worse.   You have abdominal or rectal pain.   You have thin, pencil-like stools.   You have  unexplained weight loss. MAKE SURE YOU:   Understand these instructions.  Will watch your condition.  Will get help right away if you are not doing well or get worse.   This information is not intended to replace advice given to you by your health care provider. Make sure you discuss any questions you have with your health care provider.   Document Released: 02/03/2004 Document Revised: 05/28/2014 Document Reviewed: 02/16/2013 Elsevier Interactive Patient Education [employer].

## 2015-08-04 LAB — COMPREHENSIVE METABOLIC PANEL
ALBUMIN: 4.4 g/dL (ref 3.6–5.1)
ALK PHOS: 42 U/L (ref 33–130)
ALT: 36 U/L — AB (ref 6–29)
AST: 27 U/L (ref 10–35)
BILIRUBIN TOTAL: 0.4 mg/dL (ref 0.2–1.2)
BUN: 14 mg/dL (ref 7–25)
CHLORIDE: 103 mmol/L (ref 98–110)
CO2: 24 mmol/L (ref 20–31)
CREATININE: 0.47 mg/dL — AB (ref 0.50–1.05)
Calcium: 9.4 mg/dL (ref 8.6–10.4)
Glucose, Bld: 131 mg/dL — ABNORMAL HIGH (ref 65–99)
Potassium: 4.2 mmol/L (ref 3.5–5.3)
SODIUM: 136 mmol/L (ref 135–146)
TOTAL PROTEIN: 6.6 g/dL (ref 6.1–8.1)

## 2015-08-10 ENCOUNTER — Encounter: Payer: Self-pay | Admitting: Family Medicine

## 2015-08-22 ENCOUNTER — Other Ambulatory Visit: Payer: Self-pay | Admitting: Family Medicine

## 2015-08-29 ENCOUNTER — Ambulatory Visit (HOSPITAL_BASED_OUTPATIENT_CLINIC_OR_DEPARTMENT_OTHER): Payer: 59 | Attending: Family Medicine | Admitting: Internal Medicine

## 2015-08-29 VITALS — Ht <= 58 in | Wt 148.0 lb

## 2015-08-29 DIAGNOSIS — E669 Obesity, unspecified: Secondary | ICD-10-CM | POA: Insufficient documentation

## 2015-08-29 DIAGNOSIS — G478 Other sleep disorders: Secondary | ICD-10-CM | POA: Diagnosis present

## 2015-08-29 DIAGNOSIS — I493 Ventricular premature depolarization: Secondary | ICD-10-CM | POA: Diagnosis not present

## 2015-08-29 DIAGNOSIS — Z6833 Body mass index (BMI) 33.0-33.9, adult: Secondary | ICD-10-CM | POA: Diagnosis not present

## 2015-08-29 DIAGNOSIS — G4736 Sleep related hypoventilation in conditions classified elsewhere: Secondary | ICD-10-CM | POA: Diagnosis not present

## 2015-08-29 DIAGNOSIS — Z8669 Personal history of other diseases of the nervous system and sense organs: Secondary | ICD-10-CM

## 2015-08-29 DIAGNOSIS — G4733 Obstructive sleep apnea (adult) (pediatric): Secondary | ICD-10-CM | POA: Diagnosis not present

## 2015-08-29 DIAGNOSIS — Z87898 Personal history of other specified conditions: Secondary | ICD-10-CM | POA: Insufficient documentation

## 2015-08-29 DIAGNOSIS — R0683 Snoring: Secondary | ICD-10-CM | POA: Insufficient documentation

## 2015-09-10 DIAGNOSIS — G4733 Obstructive sleep apnea (adult) (pediatric): Secondary | ICD-10-CM

## 2015-09-10 DIAGNOSIS — E669 Obesity, unspecified: Secondary | ICD-10-CM

## 2015-09-10 DIAGNOSIS — G478 Other sleep disorders: Secondary | ICD-10-CM | POA: Diagnosis not present

## 2015-09-10 DIAGNOSIS — Z87898 Personal history of other specified conditions: Secondary | ICD-10-CM

## 2015-09-10 NOTE — Progress Notes (Signed)
  Patient Name: Julie Fischer, Chai Date: 08/29/2015 Gender: Female D.O.B: [birth year] Age (years): 54 Referring Provider: Rita Ohara Height (inches): 90 Interpreting Physician: Baird Lyons MD, ABSM Weight (lbs): 148 RPSGT: Madelon Lips BMI: 33 MRN: [id number] Neck Size: 14.00 CLINICAL INFORMATION Sleep Study Type: NPSG Indication for sleep study: OSA Epworth Sleepiness Score: 2  SLEEP STUDY TECHNIQUE As per the AASM Manual for the Scoring of Sleep and Associated Events v2.3 (April 2016) with a hypopnea requiring 4% desaturations. The channels recorded and monitored were frontal, central and occipital EEG, electrooculogram (EOG), submentalis EMG (chin), nasal and oral airflow, thoracic and abdominal wall motion, anterior tibialis EMG, snore microphone, electrocardiogram, and pulse oximetry.  MEDICATIONS Patient's medications include: charted for review. Medications self-administered by patient during sleep study : No sleep medicine administered.  SLEEP ARCHITECTURE The study was initiated at 10:06:59 PM and ended at 4:18:47 AM. Sleep onset time was 105.2 minutes and the sleep efficiency was 67.3%. The total sleep time was 250.1 minutes. Stage REM latency was 188.5 minutes. The patient spent 13.83% of the night in stage N1 sleep, 69.98% in stage N2 sleep, 2.60% in stage N3 and 13.60% in REM. Alpha intrusion was absent. Supine sleep was 59.49%. Wake after sleep onset 16.5 minutes  RESPIRATORY PARAMETERS The overall apnea/hypopnea index (AHI) was 42.0 per hour. There were 51 total apneas, including 50 obstructive, 1 central and 0 mixed apneas. There were 124 hypopneas and 27 RERAs. The AHI during Stage REM sleep was 0.0 per hour. AHI while supine was 52.4 per hour. The mean oxygen saturation was 93.99%. The minimum SpO2 during sleep was 78.00%. Loud snoring was noted during this study.  CARDIAC DATA The 2 lead EKG demonstrated sinus rhythm. The mean heart rate was 72.68  beats per minute. Other EKG findings include: PVCs.  LEG MOVEMENT DATA The total PLMS were 1 with a resulting PLMS index of 0.24. Associated arousal with leg movement index was 0.0 .  IMPRESSIONS - Severe obstructive sleep apnea occurred during this study (AHI = 42.0/h). - Sleep onset was delayed and there was insufficient early sleep to meet protocol requirements for split CPAP titration. - No significant central sleep apnea occurred during this study (CAI = 0.2/h). - Moderate oxygen desaturation was noted during this study (Min O2 = 78.00%). - The patient snored with Loud snoring volume. - EKG findings include PVCs. - Clinically significant periodic limb movements did not occur during sleep. No significant associated arousals.  DIAGNOSIS - Obstructive Sleep Apnea (327.23 [G47.33 ICD-10]) - Nocturnal Hypoxemia (327.26 [G47.36 ICD-10])  RECOMMENDATIONS - Therapeutic CPAP titration to determine optimal pressure required to alleviate sleep disordered breathing. - Positional therapy avoiding supine position during sleep. - Avoid alcohol, sedatives and other CNS depressants that may worsen sleep apnea and disrupt normal sleep architecture. - Sleep hygiene should be reviewed to assess factors that may improve sleep quality. - Weight management and regular exercise should be initiated or continued if appropriate.  Deneise Lever Diplomate, American Board of Sleep Medicine  ELECTRONICALLY SIGNED ON:  09/10/2015, 11:47 AM [place] PH: [phone]   FX: [phone] [place]

## 2015-09-19 NOTE — Telephone Encounter (Signed)
Recv'd notice no P.A. Required, I called pharmacy and went thru insurance in March & April

## 2015-09-26 ENCOUNTER — Telehealth: Payer: Self-pay | Admitting: *Deleted

## 2015-09-26 NOTE — Telephone Encounter (Signed)
-----   Message from Rita Ohara, MD sent at 09/24/2015  3:14 PM EDT ----- Regarding: sleep study results Not sure if she ever called for results of sleep study or not, but as I was going through my inbox today, I saw them:  DIAGNOSIS - Obstructive Sleep Apnea (327.23 [G47.33 ICD-10]) - Nocturnal Hypoxemia (327.26 [G47.36 ICD-10])  RECOMMENDATIONS - Therapeutic CPAP titration to determine optimal pressure required to alleviate sleep disordered breathing. - Positional therapy avoiding supine position during sleep. - Avoid alcohol, sedatives and other CNS depressants that may worsen sleep apnea and disrupt normal sleep architecture. - Sleep hygiene should be reviewed to assess factors that may improve sleep quality. - Weight management and regular exercise should be initiated or continued if appropriate.   I don't know if she was ever followed by pulm (had OSA treated in the past, prior to weight loss surgery), or if f/u is scheduled.  They weren't able to do split night, so needs to have CPAP titration study. Not sure if this needs to be done at home or at sleep lab  She had significant desats so probably should have the pulse oximetry along with the CPAP titration, to ensure it improves with treatment.  Please call pt (and cut/paste info into a phone note).  Thanks

## 2015-09-26 NOTE — Telephone Encounter (Signed)
Looks like she saw Dr.Young on 08/29/15, would you still like me to call her?

## 2015-09-26 NOTE — Telephone Encounter (Signed)
4/10 was the sleep study--I don't think she has had an office visit with him (click on the visit from that date and you will see the sleep study)

## 2015-09-26 NOTE — Telephone Encounter (Signed)
Left message for patient to return my call.

## 2015-09-28 ENCOUNTER — Other Ambulatory Visit: Payer: Self-pay | Admitting: *Deleted

## 2015-09-28 DIAGNOSIS — G4733 Obstructive sleep apnea (adult) (pediatric): Secondary | ICD-10-CM

## 2015-09-28 NOTE — Telephone Encounter (Signed)
Spoke with patient and sleep lab. They recommend having CPAP titration part done at [facility] and they said oximetry was automatic, no need to order. I told patient I would order and she was agreeable.

## 2015-10-25 ENCOUNTER — Other Ambulatory Visit: Payer: Self-pay | Admitting: Family Medicine

## 2015-10-25 NOTE — Telephone Encounter (Signed)
Called med into pharmacy

## 2015-10-25 NOTE — Telephone Encounter (Signed)
Ok to refill #90.   

## 2015-10-25 NOTE — Telephone Encounter (Signed)
Is this ok to call in?

## 2015-11-06 ENCOUNTER — Ambulatory Visit (HOSPITAL_BASED_OUTPATIENT_CLINIC_OR_DEPARTMENT_OTHER): Payer: 59 | Attending: Family Medicine | Admitting: Internal Medicine

## 2015-11-06 VITALS — Ht <= 58 in | Wt 148.0 lb

## 2015-11-06 DIAGNOSIS — G4733 Obstructive sleep apnea (adult) (pediatric): Secondary | ICD-10-CM | POA: Diagnosis not present

## 2015-11-12 DIAGNOSIS — G4733 Obstructive sleep apnea (adult) (pediatric): Secondary | ICD-10-CM

## 2015-11-12 NOTE — Procedures (Signed)
   Patient Name: Julie Fischer, Julie Fischer Date: 11/06/2015 Gender: Female D.O.B: [birth year] Age (years): 63 Referring Provider: Rita Ohara Height (inches): 56 Interpreting Physician: Baird Lyons MD, ABSM Weight (lbs): 148 RPSGT: Baxter Flattery BMI: 33 MRN: [id number] Neck Size: 14.00 CLINICAL INFORMATION The patient is referred for a CPAP titration to treat sleep apnea.   Date of NPSG, Split Night or HST: Diagnostic NPSG 09/07/15  AHI 42/ hr, desaturation to 78%, body weight 148 lbs  SLEEP STUDY TECHNIQUE As per the AASM Manual for the Scoring of Sleep and Associated Events v2.3 (April 2016) with a hypopnea requiring 4% desaturations. The channels recorded and monitored were frontal, central and occipital EEG, electrooculogram (EOG), submentalis EMG (chin), nasal and oral airflow, thoracic and abdominal wall motion, anterior tibialis EMG, snore microphone, electrocardiogram, and pulse oximetry. Continuous positive airway pressure (CPAP) was initiated at the beginning of the study and titrated to treat sleep-disordered breathing.  MEDICATIONS Medications taken by the patient : charted for review Medications administered by patient during sleep study : Sleep medicine administered - Ambien 10 mg at 08:35:31 PM  TECHNICIAN COMMENTS Comments added by technician: Pressure was tolerated Comments added by scorer: N/A  RESPIRATORY PARAMETERS Optimal PAP Pressure (cm): 10 AHI at Optimal Pressure (/hr): 0.0 Overall Minimal O2 (%): 88.00 Supine % at Optimal Pressure (%): 30 Minimal O2 at Optimal Pressure (%): 92.0    SLEEP ARCHITECTURE The study was initiated at 10:30:28 PM and ended at 4:52:40 AM. Sleep onset time was 11.5 minutes and the sleep efficiency was 91.0%. The total sleep time was 347.7 minutes. The patient spent 3.59% of the night in stage N1 sleep, 63.91% in stage N2 sleep, 18.98% in stage N3 and 13.52% in REM.Stage REM latency was 230.5 minutes Wake after sleep onset was  23.0. Alpha intrusion was absent. Supine sleep was 24.43%.  CARDIAC DATA The 2 lead EKG demonstrated sinus rhythm. The mean heart rate was 69.75 beats per minute. Other EKG findings include: None.  LEG MOVEMENT DATA The total Periodic Limb Movements of Sleep (PLMS) were 142. The PLMS index was 24.50. A PLMS index of <15 is considered normal in adults.  IMPRESSIONS - The optimal PAP pressure was 10 cm of water. - Central sleep apnea was not noted during this titration (CAI = 0.0/h). - Mild oxygen desaturations were observed during this titration (min O2 = 88.00%). - No snoring was audible during this study. - No cardiac abnormalities were observed during this study. - Mild periodic limb movements were observed during this study. Arousals associated with PLMs were rare.  DIAGNOSIS - Obstructive Sleep Apnea (327.23 [G47.33 ICD-10])  RECOMMENDATIONS - Trial of CPAP therapy on 10 cm H2O with a Small size Resmed Full Face Mask AirFit F20 mask and heated humidification. - Avoid alcohol, sedatives and other CNS depressants that may worsen sleep apnea and disrupt normal sleep architecture. - Sleep hygiene should be reviewed to assess factors that may improve sleep quality. - Weight management and regular exercise should be initiated or continued.  Deneise Lever Diplomate, American Board of Sleep Medicine  ELECTRONICALLY SIGNED ON:  11/12/2015, 11:43 AM [place] PH: [phone]   FX: [phone] [place]

## 2015-12-07 ENCOUNTER — Encounter: Payer: Self-pay | Admitting: Family Medicine

## 2015-12-07 ENCOUNTER — Encounter: Payer: Self-pay | Admitting: Gastroenterology

## 2015-12-07 ENCOUNTER — Ambulatory Visit (INDEPENDENT_AMBULATORY_CARE_PROVIDER_SITE_OTHER): Payer: 59 | Admitting: Family Medicine

## 2015-12-07 VITALS — BP 120/80 | HR 62 | Temp 98.7°F | Resp 16 | Ht <= 58 in | Wt 158.6 lb

## 2015-12-07 DIAGNOSIS — S61219A Laceration without foreign body of unspecified finger without damage to nail, initial encounter: Secondary | ICD-10-CM

## 2015-12-07 DIAGNOSIS — R059 Cough, unspecified: Secondary | ICD-10-CM

## 2015-12-07 DIAGNOSIS — R05 Cough: Secondary | ICD-10-CM

## 2015-12-07 DIAGNOSIS — J069 Acute upper respiratory infection, unspecified: Secondary | ICD-10-CM

## 2015-12-07 MED ORDER — HYDROCODONE-HOMATROPINE 5-1.5 MG/5ML PO SYRP: 5.0000 mL | ORAL_SOLUTION | Freq: Three times a day (TID) | ORAL | Status: DC | PRN

## 2015-12-07 NOTE — Progress Notes (Signed)
Chief Complaint  Patient presents with  . Cough    onset 1 week. no other sx other than snoring noted by husband. /RLB    4-5 days ago she started coughing. Throat feels raw "like coughing up tacks" when she coughs.  Denies significant nasal congestion, no runny nose. She has had some ear plugging intermittently; no ear pain.  Some sinus pressure, like a mask around and behind her eyes.  Cough is nonproductive.    She used Vicks on her chest--didn't help. No other OTC meds for cough. Hot showers didn't help.  Slight shortness of breath--feels like there is a lot of pressure on her chest.   Denies edema, chest pain (just heaviness). No sick contacts. Recent camping trip (in a camper--air conditioned).  She cut a paper cut from a piece of plastic last week.  This occurred on her left index finger.  It remains red.  It hasn't gotten much larger or more painful. No drainage, no fevers.  She is wondering if it is infected.  Denies any foreign body.  OSA:  She had CPAP titration study last month: IMPRESSIONS - The optimal PAP pressure was 10 cm of water. - Central sleep apnea was not noted during this titration (CAI = 0.0/h). - Mild oxygen desaturations were observed during this titration (min O2 = 88.00%). - No snoring was audible during this study. - No cardiac abnormalities were observed during this study. - Mild periodic limb movements were observed during this study. Arousals associated with PLMs were rare. RECOMMENDATIONS - Trial of CPAP therapy on 10 cm H2O with a Small size Resmed Full Face Mask AirFit F20 mask and heated humidification. - Avoid alcohol, sedatives and other CNS depressants that may worsen sleep apnea and disrupt normal sleep architecture. - Sleep hygiene should be reviewed to assess factors that may improve sleep quality. - Weight management and regular exercise should be initiated or continued.  PMH, PSH ,SH reviewed  Outpatient Encounter Prescriptions as of  12/07/2015  Medication Sig Note  . acetaminophen (TYLENOL) 500 MG tablet Take 1,000 mg by mouth every 6 (six) hours as needed. Reported on 08/03/2015   . alendronate (FOSAMAX) 70 MG tablet TAKE 1 TABLET EVERY SEVEN DAYS WITH A FULL GLASS OF WATER ON AN EMPTY STOMACH   . Calcium Carbonate-Vitamin D (CALTRATE 600+D) 600-400 MG-UNIT per tablet Take 1 tablet by mouth 2 (two) times daily.    . cholecalciferol (VITAMIN D) 1000 UNITS tablet Take 1,000 Units by mouth daily.   01/28/2014: Taking every other day  . ibuprofen (ADVIL,MOTRIN) 200 MG tablet Take 800 mg by mouth every 8 (eight) hours as needed for pain. Reported on 08/03/2015   . lisinopril (PRINIVIL,ZESTRIL) 10 MG tablet Take 1 tablet (10 mg total) by mouth daily.   Marland Kitchen Lysine 1000 MG TABS Take 1 tablet by mouth daily as needed. Reported on 08/03/2015 07/04/2011: AS NEEDED  . Multiple Vitamins-Minerals (MULTIVITAMIN WITH MINERALS) tablet Take 1 tablet by mouth daily.     . Omega-3 Fatty Acids (FISH OIL) 1200 MG CAPS Take 1 capsule by mouth daily. 2 nightly   . PARoxetine (PAXIL) 40 MG tablet Take 1 tablet (40 mg total) by mouth every morning.   . RESTASIS 0.05 % ophthalmic emulsion INSTILL 1 DROP IN [facility] EYE 2 TIMES DAILY 02/03/2015: Received from: External Pharmacy  . simethicone (MYLICON) 80 MG chewable tablet Chew 80 mg by mouth as needed.   12/07/2015: prn  . simvastatin (ZOCOR) 20 MG tablet Take 1 tablet (  20 mg total) by mouth at bedtime.   . valACYclovir (VALTREX) 1000 MG tablet TAKE 2 TABLETS AT ONSET OF COLD SORE AND REPEAT IN 12 HOURS (4 TABLETS PER EPISODE OF COLD SORE) 12/07/2015: prn  . vitamin C (ASCORBIC ACID) 500 MG tablet Take 500 mg by mouth daily.     . vitamin E 400 UNIT capsule Take 400 Units by mouth daily.     Marland Kitchen zolpidem (AMBIEN CR) 12.5 MG CR tablet TAKE 1 TABLET BY MOUTH AT BEDTIME AS NEEDED    No facility-administered encounter medications on file as of 12/07/2015.   Allergies  Allergen Reactions  . Heparin Other (See  Comments)    Low platelets  . Niacin And Related Other (See Comments)    Blotchy/itchy  . Tessalon [Benzonatate] Diarrhea  . Penicillins Rash   ROS: No fever, chills, nausea, vomiting, diarrhea; no bleeding, bruising, rash. No headaches (other than sinus pressure), dizziness, numbness, tingling, leg cramps, edema or other complaints.  See HPI.  PHYSICAL EXAM: BP 120/80 mmHg  Pulse 62  Temp(Src) 98.7 F (37.1 C) (Oral)  Resp 16  Ht 4' 9"  (1.448 m)  Wt 158 lb 9.6 oz (71.94 kg)  BMI 34.31 kg/m2  SpO2 97%  Well developed, pleasant female, with occasional dry cough. Speaking easily in full sentences HEENT: PERRL, EOMI, conjunctiva and sclera are clear. Nasal mucosa was mildly edematous, pale, with some white crusting noted on the left.  Mild diffuse sinus tenderness. OP clear. TM's and EAC's normal (PE tubes present). Neck: No lymphadenopathy or mass Heart: regular rate and rhythm, no murmur Lungs: slightly coarse anteriorly, but improved with ongoing deep breaths. No rales, ronchi or wheezes.  Good air movement. No coughing with deep breaths or forced expiration. Left index finger: 8x33m area of raised erythema at the left index finger at the radial aspect of the PIP joint.  There is no induration, fluctuance, drainage or warmth.  ASSESSMENT/PLAN:  Acute upper respiratory infection - discussed s/sx for which she should [place]  Cough - risks/side effects of cough syrup reviewed - Plan: HYDROcodone-homatropine (HYCODAN) 5-1.5 MG/5ML syrup  Finger laceration, initial encounter - very superficial, L index finger. S/sx of infection were reviewed--call/return if they develop, not infected today   I suspect that the cough is related to postnasal drainage from some sinus congestion. I do not hear evidence of bronchitis or pneumonia at this time. Drink plenty of water. Start taking Mucinex (or other guaifenesin, round the clock) to help thin out any secretions (in the sinuses  and in the bronchial tubes/chest). You may notice increase in drainage (from nose and/or get a productive cough) after using the mucinex. I encourage you to also use sinuses rinses (sinus rinse kit or neti-pot). Contact [place](by phone or MyChart) if worsening cough, fever, worsening discolored mucus). If the barky cough remains the same or worsens, also let [place].

## 2015-12-07 NOTE — Patient Instructions (Signed)
  I suspect that the cough is related to postnasal drainage from some sinus congestion. I do not hear evidence of bronchitis or pneumonia at this time. Drink plenty of water. Start taking Mucinex (or other guaifenesin, round the clock) to help thin out any secretions (in the sinuses and in the bronchial tubes/chest). You may notice increase in drainage (from nose and/or get a productive cough) after using the mucinex. I encourage you to also use sinuses rinses (sinus rinse kit or neti-pot). Contact us (by phone or MyChart) if worsening cough, fever, worsening discolored mucus). If the barky cough remains the same or worsens, also let us know.

## 2015-12-19 ENCOUNTER — Encounter: Payer: Self-pay | Admitting: Family Medicine

## 2015-12-19 ENCOUNTER — Ambulatory Visit (INDEPENDENT_AMBULATORY_CARE_PROVIDER_SITE_OTHER): Payer: 59 | Admitting: Family Medicine

## 2015-12-19 VITALS — BP 134/84 | HR 80 | Temp 100.0°F | Ht <= 58 in | Wt 156.6 lb

## 2015-12-19 DIAGNOSIS — R05 Cough: Secondary | ICD-10-CM | POA: Diagnosis not present

## 2015-12-19 DIAGNOSIS — J209 Acute bronchitis, unspecified: Secondary | ICD-10-CM | POA: Diagnosis not present

## 2015-12-19 DIAGNOSIS — R059 Cough, unspecified: Secondary | ICD-10-CM

## 2015-12-19 DIAGNOSIS — R5383 Other fatigue: Secondary | ICD-10-CM

## 2015-12-19 MED ORDER — AZITHROMYCIN 250 MG PO TABS: ORAL_TABLET | ORAL | 0 refills | Status: DC

## 2015-12-19 MED ORDER — BENZONATATE 200 MG PO CAPS: 200.0000 mg | ORAL_CAPSULE | Freq: Three times a day (TID) | ORAL | 0 refills | Status: DC | PRN

## 2015-12-19 NOTE — Patient Instructions (Addendum)
  Take the zpak as directed. Remember that this antibiotic stays in your system and works for 10 days, even though you only take it for 5.  Your fever should resolve within 48 hours or so of starting the antibiotic. If fever is getting worse or persisting, please let us know. Take the benzonatate as needed for cough. You previously stated that this causes diarrhea--consider taking a probiotic along with it (and the antibiotic). Stop the cough medication if diarrhea is a problem. Call in a few days if your cough is NOT improving, and we can call in a short course of steroids for you.  Look at all the ingredients in your over-the-counter medications. Be sure that you aren't getting duplicate--the dextromethorphan is in your Mucinex DM, Nyquil and the Delsym syrup, so don't use these together.

## 2015-12-19 NOTE — Progress Notes (Signed)
Chief Complaint  Patient presents with  . Cough    and wheezing. Seen 7/19, feels that she is much worse. Mucus is now yellow in color and is having some fevers.    Symptoms of URI began on 7/14--seen here on 7/19.  Supportive measures were recommended at that time, given rx for hycodan cough syrup. She has continued to cough, but been "maintaining", still going to work.  She feels like she has gotten worse in the last day or so. She feels more fatigued. Started with some fevers in the last 4 days or so, low grade.  Denies chills. Sinus pressure resolved, only occasional small amount of green mucus from the nose "nothing to be alarmed about". The cough is still "horrible"--mostly nonproductive, occasionally gets up creamy-yellow-green phlegm.  She feels short of breath, not related to coughing spasms--feels easily winded.  Denies any pressure/tightness in her chest, no pain with breathing, just feels like she can't get a deep enough breath.  Coughs at night and during the day.  She takes her Lorrin Mais, so she doesn't report being kept awake from the cough.  Her husband accompanies her today, and reports that last night she slept better, didn't cough much, other nights are worse.  PMH, PSH, SH reviewed  Outpatient Encounter Prescriptions as of 12/19/2015  Medication Sig Note  . alendronate (FOSAMAX) 70 MG tablet TAKE 1 TABLET EVERY SEVEN DAYS WITH A FULL GLASS OF WATER ON AN EMPTY STOMACH   . Calcium Carbonate-Vitamin D (CALTRATE 600+D) 600-400 MG-UNIT per tablet Take 1 tablet by mouth 2 (two) times daily.    . cholecalciferol (VITAMIN D) 1000 UNITS tablet Take 1,000 Units by mouth daily.   01/28/2014: Taking every other day  . dextromethorphan (DELSYM) 30 MG/5ML liquid Take 30 mg by mouth as needed for cough.   Marland Kitchen lisinopril (PRINIVIL,ZESTRIL) 10 MG tablet Take 1 tablet (10 mg total) by mouth daily.   . Multiple Vitamins-Minerals (MULTIVITAMIN WITH MINERALS) tablet Take 1 tablet by mouth daily.     .  Omega-3 Fatty Acids (FISH OIL) 1200 MG CAPS Take 1 capsule by mouth daily. 2 nightly   . PARoxetine (PAXIL) 40 MG tablet Take 1 tablet (40 mg total) by mouth every morning.   . Pseudoeph-Doxylamine-DM-APAP (NYQUIL PO) Take 30 mLs by mouth at bedtime.   . RESTASIS 0.05 % ophthalmic emulsion INSTILL 1 DROP IN [facility] EYE 2 TIMES DAILY 02/03/2015: Received from: External Pharmacy  . simvastatin (ZOCOR) 20 MG tablet Take 1 tablet (20 mg total) by mouth at bedtime.   . vitamin C (ASCORBIC ACID) 500 MG tablet Take 500 mg by mouth daily.     . vitamin E 400 UNIT capsule Take 400 Units by mouth daily.     Marland Kitchen zolpidem (AMBIEN CR) 12.5 MG CR tablet TAKE 1 TABLET BY MOUTH AT BEDTIME AS NEEDED   . acetaminophen (TYLENOL) 500 MG tablet Take 1,000 mg by mouth every 6 (six) hours as needed. Reported on 08/03/2015   . HYDROcodone-homatropine (HYCODAN) 5-1.5 MG/5ML syrup Take 5 mLs by mouth every 8 (eight) hours as needed for cough. (Patient not taking: Reported on 12/19/2015)   . ibuprofen (ADVIL,MOTRIN) 200 MG tablet Take 800 mg by mouth every 8 (eight) hours as needed for pain. Reported on 08/03/2015   . Lysine 1000 MG TABS Take 1 tablet by mouth daily as needed. Reported on 08/03/2015 07/04/2011: AS NEEDED  . simethicone (MYLICON) 80 MG chewable tablet Chew 80 mg by mouth as needed.   12/07/2015:  prn  . valACYclovir (VALTREX) 1000 MG tablet TAKE 2 TABLETS AT ONSET OF COLD SORE AND REPEAT IN 12 HOURS (4 TABLETS PER EPISODE OF COLD SORE) (Patient not taking: Reported on 12/19/2015) 12/07/2015: prn   No facility-administered encounter medications on file as of 12/19/2015.    She has also been taking Nyquil at bedtime, Delsym syrup, Hall's cough drops. She has been taking Mucinex DM, none yet today. Started taking the Delsym last night. Ran out of the Hycodan  Allergies  Allergen Reactions  . Heparin Other (See Comments)    Low platelets  . Niacin And Related Other (See Comments)    Blotchy/itchy  . Tessalon  [Benzonatate] Diarrhea  . Penicillins Rash   ROS:  No nausea, vomiting, diarrhea (loose today, after not having gone for a few days). No rashes, bleeding, bruising. +low grade fevers. Denies headaches; slight dizziness if she stands too quickly. +cough and shortness of breath as per HPI.  +LG fever.  No chest pain, palpitations, edema or other concerns, except as noted in HPI  PHYSICAL EXAM:  BP 134/84   Pulse 80   Temp 100 F (37.8 C) (Tympanic)   Ht 4\' 9"  (1.448 m)   Wt 156 lb 9.6 oz (71 kg)   BMI 33.89 kg/m   Fatigued, frustrated-appearing female in no distress.  Only occasional intermittent dry cough noted. Speaking easily in full sentences, in no distress (just tired, teary at one point). HEENT: PERRL, EOMI, conjunctiva and sclera are clear.  Nasal mucosa is mild-mod edematous, pale, with some clear mucus noted on the left.  Sinuses nontender, OP is clear.  TM's and EAC's are normal (blue tube seen on the right). Neck: no lymphadenopathy or mass Heart: regular rate and rhythm without murmur Lungs: clear bilaterally, no wheezes, rales, ronchi, no cough with forced expiration Intermittent hacky dry cough during visit, not associated with speaking, forced expiration or deep breaths. Skin: normal turgor, no lesions Psych: full range of affect, normal eye contact and speech.  Appears tired, frustrated Neuro: alert and oriented, cranial nerves intact, normal strength, gait  ASSESSMENT/PLAN:  Acute bronchitis, unspecified organism - Plan: azithromycin (ZITHROMAX) 250 MG tablet  Cough - Plan: benzonatate (TESSALON) 200 MG capsule  Other fatigue - suspect related to developing bacterial infection; no e/o pna. May be from overlap of DM in OTC meds--avoid    Take the zpak as directed. Remember that this antibiotic stays in your system and works for 10 days, even though you only take it for 5.  Your fever should resolve within 48 hours or so of starting the antibiotic. If fever is  getting worse or persisting, please let us know. Take the benzonatate as needed for cough. You previously stated that this causes diarrhea--consider taking a probiotic along with it (and the antibiotic). Stop the cough medication if diarrhea is a problem. Call in a few days if your cough is NOT improving, and we can call in a short course of steroids for you.  Look at all the ingredients in your over-the-counter medications. Be sure that you aren't getting duplicate--the dextromethorphan is in your Mucinex DM, Nyquil and the Delsym syrup, so don't use these together.   25 min visit, more than 1/2 spent counseling. Risks/side effects of meds reviewed (including steroids, if needed within the next week). Discussed tessalon and lsited diarrhea in her chart under allergies--intolerance/side effect, not allergic reaction. She is willing to try this again.

## 2016-01-25 ENCOUNTER — Other Ambulatory Visit: Payer: Self-pay | Admitting: Family Medicine

## 2016-01-25 NOTE — Telephone Encounter (Signed)
Yes, ok for #90 

## 2016-01-25 NOTE — Telephone Encounter (Signed)
Is this okay to call in? 

## 2016-01-30 ENCOUNTER — Other Ambulatory Visit: Payer: Self-pay | Admitting: Family Medicine

## 2016-01-30 DIAGNOSIS — E7800 Pure hypercholesterolemia, unspecified: Secondary | ICD-10-CM

## 2016-02-07 ENCOUNTER — Encounter: Payer: Self-pay | Admitting: Family Medicine

## 2016-02-07 ENCOUNTER — Ambulatory Visit (INDEPENDENT_AMBULATORY_CARE_PROVIDER_SITE_OTHER): Payer: 59 | Admitting: Family Medicine

## 2016-02-07 VITALS — BP 150/84 | HR 76 | Temp 98.9°F | Ht <= 58 in | Wt 158.2 lb

## 2016-02-07 DIAGNOSIS — J019 Acute sinusitis, unspecified: Secondary | ICD-10-CM | POA: Diagnosis not present

## 2016-02-07 DIAGNOSIS — J040 Acute laryngitis: Secondary | ICD-10-CM | POA: Diagnosis not present

## 2016-02-07 DIAGNOSIS — J029 Acute pharyngitis, unspecified: Secondary | ICD-10-CM | POA: Diagnosis not present

## 2016-02-07 DIAGNOSIS — B379 Candidiasis, unspecified: Secondary | ICD-10-CM

## 2016-02-07 MED ORDER — FLUCONAZOLE 100 MG PO TABS: 100.0000 mg | ORAL_TABLET | Freq: Every day | ORAL | 0 refills | Status: DC

## 2016-02-07 MED ORDER — OMEPRAZOLE MAGNESIUM 20 MG PO TBEC: DELAYED_RELEASE_TABLET | ORAL | 0 refills | Status: AC

## 2016-02-07 NOTE — Progress Notes (Signed)
Chief Complaint  Patient presents with  . Bronchitis    saw UC last Friday and was diagnosed with bronchitis. Not any better, may be worse. Got sent home from work yesterday. Supposed to work at 3:30 but unsure if she should. Taking prednisone and cefdnir.    Went to [facility] [place] 9/15 with complaints of laryngitis, chest pain, sinus congestion and headaches.  She hadn't been coughing.  She was prescribed Cefdinir 300mg  BID and prednisone 40mg  daily x 6 days.  She denies feeling any better--if anything is worse. On further clarification, the sinus pain/head pressure that she had at the UC visit has improved significantly, but is complaining of worsening pain/rawness in her throat.  She reports this feels like being scraped, like knifes, and it hurts in the lower chest when she breathes.  Head congestion and sinuses feel better, but throat and chest feel worse.    She had some chills/sweats on 9/17, not much since then.   She is having some insomnia (early morning awakening--still takes [place] and falls asleep easily) and some acne since being on the prednisone, otherwise tolerating it well.  Some diarrhea from the ABX, mild.  She recalls having a "white yucky tongue" shortly after being diagnosed with bronchitis (7/31), starting after she was seen and started on antibiotics. Doesn't recall having any tongue pain or white spots in mouth recently.    PMH, PSH ,SH reviewed  Outpatient Encounter Prescriptions as of 02/07/2016  Medication Sig Note  . alendronate (FOSAMAX) 70 MG tablet TAKE 1 TABLET EVERY SEVEN DAYS WITH A FULL GLASS OF WATER ON AN EMPTY STOMACH   . Calcium Carbonate-Vitamin D (CALTRATE 600+D) 600-400 MG-UNIT per tablet Take 1 tablet by mouth 2 (two) times daily.    . cefdinir (OMNICEF) 300 MG capsule Take 300 mg by mouth 2 (two) times daily.  02/07/2016: Received from: External Pharmacy  . cholecalciferol (VITAMIN D) 1000 UNITS tablet Take 1,000 Units by mouth daily.   01/28/2014: Taking every other  day  . lisinopril (PRINIVIL,ZESTRIL) 10 MG tablet Take 1 tablet (10 mg total) by mouth daily.   . Multiple Vitamins-Minerals (MULTIVITAMIN WITH MINERALS) tablet Take 1 tablet by mouth daily.     . Omega-3 Fatty Acids (FISH OIL) 1200 MG CAPS Take 1 capsule by mouth daily. 2 nightly   . PARoxetine (PAXIL) 40 MG tablet Take 1 tablet (40 mg total) by mouth every morning.   . predniSONE (DELTASONE) 20 MG tablet Take 40 mg by mouth daily with breakfast.  02/07/2016: Received from: External Pharmacy  . RESTASIS 0.05 % ophthalmic emulsion INSTILL 1 DROP IN [facility] EYE 2 TIMES DAILY 02/03/2015: Received from: External Pharmacy  . simvastatin (ZOCOR) 20 MG tablet TAKE 1 TABLET AT BEDTIME   . vitamin C (ASCORBIC ACID) 500 MG tablet Take 500 mg by mouth daily.     . vitamin E 400 UNIT capsule Take 400 Units by mouth daily.     Marland Kitchen zolpidem (AMBIEN CR) 12.5 MG CR tablet TAKE 1 TABLET BY MOUTH AT BEDTIME AS NEEDED   . acetaminophen (TYLENOL) 500 MG tablet Take 1,000 mg by mouth every 6 (six) hours as needed. Reported on 08/03/2015   . ibuprofen (ADVIL,MOTRIN) 200 MG tablet Take 800 mg by mouth every 8 (eight) hours as needed for pain. Reported on 08/03/2015   . Lysine 1000 MG TABS Take 1 tablet by mouth daily as needed. Reported on 08/03/2015 07/04/2011: AS NEEDED  . simethicone (MYLICON) 80 MG chewable tablet Chew 80 mg by  mouth as needed.   12/07/2015: prn  . valACYclovir (VALTREX) 1000 MG tablet TAKE 2 TABLETS AT ONSET OF COLD SORE AND REPEAT IN 12 HOURS (4 TABLETS PER EPISODE OF COLD SORE) (Patient not taking: Reported on 02/07/2016) 12/07/2015: prn  . [DISCONTINUED] azithromycin (ZITHROMAX) 250 MG tablet Take 2 tablets by mouth on first day, then 1 tablet by mouth on days 2 through 5   . [DISCONTINUED] benzonatate (TESSALON) 200 MG capsule Take 1 capsule (200 mg total) by mouth 3 (three) times daily as needed.   . [DISCONTINUED] dextromethorphan (DELSYM) 30 MG/5ML liquid Take 30 mg by mouth as needed for cough.   .  [DISCONTINUED] HYDROcodone-homatropine (HYCODAN) 5-1.5 MG/5ML syrup Take 5 mLs by mouth every 8 (eight) hours as needed for cough. (Patient not taking: Reported on 12/19/2015)   . [DISCONTINUED] Pseudoeph-Doxylamine-DM-APAP (NYQUIL PO) Take 30 mLs by mouth at bedtime.    No facility-administered encounter medications on file as of 02/07/2016.    Allergies  Allergen Reactions  . Heparin Other (See Comments)    Low platelets  . Niacin And Related Other (See Comments)    Blotchy/itchy  . Tessalon [Benzonatate] Diarrhea  . Penicillins Rash    ROS:  No nausea, vomiting, heartburn, abdominal pain, blood in her stool, no urinary complaints.  No cough, shortness of breath. She has been having some diarrhea since being on the antibiotics. No vaginal discharge or itching.  No bleeding, bruising, rash.   PHYSICAL EXAM:  BP (!) 150/96 (BP Location: Right Arm, Patient Position: Sitting, Cuff Size: Normal)   Pulse 76   Temp 98.9 F (37.2 C) (Tympanic)   Ht 4\' 9"  (1.448 m)   Wt 158 lb 3.2 oz (71.8 kg)   BMI 34.23 kg/m   Well developed, pleasant female in no distress. Voice is a little weak/hoarse/quiet--reports that it is painful for her to speak HEENT: PERRL, EOMI, conjunctiva and sclera are clear. TM's and EAC's normal  (blue tube seen on the right). Nasal mucosa is normal, sinuses nontender. OP is clear--tongue and mucosa are normal Neck: no lymphadenopathy, thyromegaly or mass Heart: regular rate and rhythm, no murmur Lungs: clear bilaterally, no wheezes, rales, ronchi Abdomen: soft, nontender, normal bowel sounds, no mass Extremities: no edema Skin: normal turgor, no rash Psych: normal mood, affect, hygiene and grooming Neuro; alert and oriented, cranial nerves intact, normal strength, gait   ASSESSMENT/PLAN:  Acute pharyngitis, unspecified etiology - Ddx iincludes reflux/esophagitis vs candidiasis.  Start with PPI, and if not better, treat for yeast. supportive measures reviewed -  Plan: omeprazole (PRILOSEC OTC) 20 MG tablet  Candidiasis - Plan: fluconazole (DIFLUCAN) 100 MG tablet  Laryngitis  Acute sinusitis, recurrence not specified, unspecified location - improving--complete course of ABX   Sore throat, chest, hoarseness and pain with speaking. Clinically her sinusitis symptoms have improved with steroids and antibiotics.  Mild ABX-induced diarrhea.  Throat symptoms have worsened despite treatment with steroids and ABX, making me suspect either reflux, vs an underlying yeast/fungal infection.  No thrush noted in the visible portions of the oropharynx, but will treat presumptively for this in her lower (not visualized) portion of her pharynx/larynx if not better from PPI.    Complete the course of antibiotics and prednisone as directed. Since your sinus symptoms are so much better, and you are having some side effects from the prednisone, you could decrease to 20mg  for the next 2-3 days and then stop (rather than completing the course at 40mg  daily).  Start taking some probiotics to  help with the diarrhea from the antibiotics. Start taking Prilosec OTC 20mg  once daily (if very helpful, but recurrent symptoms later in the day, you can take it up to twice daily).  Take this for 1-2 weeks.  If you get NO improvement after a few days with the prilosec, then let's start treatment for presumptive yeast. Take the fluconazole once daily for 7 days. If you don't get resolution of your symptoms, we will need to refer you to ENT for further evaluation.   Do not take your simvastatin when taking fluconazole--okay to stop it for the week that you take the short-term medication (if you even need to take it--don't start the fluconazole if your symptoms improve with the prilosec).  You may use tylenol, ibuprofen, salt water gargles also to help with your throat pain.

## 2016-02-07 NOTE — Patient Instructions (Addendum)
  Complete the course of antibiotics and prednisone as directed. Since your sinus symptoms are so much better, and you are having some side effects from the prednisone, you could decrease to 20mg  for the next 2-3 days and then stop (rather than completing the course at 40mg  daily).  Start taking some probiotics to help with the diarrhea from the antibiotics. Start taking Prilosec OTC 20mg  once daily (if very helpful, but recurrent symptoms later in the day, you can take it up to twice daily).  Take this for 1-2 weeks.  If you get NO improvement after a few days with the prilosec, then let's start treatment for presumptive yeast. Take the fluconazole once daily for 7 days. If you don't get resolution of your symptoms, we will need to refer you to ENT for further evaluation.   Do not take your simvastatin when taking fluconazole--okay to stop it for the week that you take the short-term medication (if you even need to take it--don't start the fluconazole if your symptoms improve with the prilosec).  You may use tylenol, ibuprofen, salt water gargles also to help with your throat pain.

## 2016-02-19 DIAGNOSIS — A0472 Enterocolitis due to Clostridium difficile, not specified as recurrent: Secondary | ICD-10-CM

## 2016-02-19 HISTORY — DX: Enterocolitis due to Clostridium difficile, not specified as recurrent: A04.72

## 2016-02-23 ENCOUNTER — Ambulatory Visit (INDEPENDENT_AMBULATORY_CARE_PROVIDER_SITE_OTHER): Payer: 59 | Admitting: Family Medicine

## 2016-02-23 ENCOUNTER — Encounter: Payer: Self-pay | Admitting: Family Medicine

## 2016-02-23 VITALS — BP 130/78 | HR 72 | Temp 98.3°F | Ht <= 58 in | Wt 154.2 lb

## 2016-02-23 DIAGNOSIS — Z23 Encounter for immunization: Secondary | ICD-10-CM

## 2016-02-23 DIAGNOSIS — G4733 Obstructive sleep apnea (adult) (pediatric): Secondary | ICD-10-CM | POA: Diagnosis not present

## 2016-02-23 DIAGNOSIS — Z9989 Dependence on other enabling machines and devices: Secondary | ICD-10-CM | POA: Diagnosis not present

## 2016-02-23 DIAGNOSIS — J302 Other seasonal allergic rhinitis: Secondary | ICD-10-CM | POA: Diagnosis not present

## 2016-02-23 DIAGNOSIS — J329 Chronic sinusitis, unspecified: Secondary | ICD-10-CM | POA: Diagnosis not present

## 2016-02-23 NOTE — Patient Instructions (Signed)
  I recommend that you start doing sinus rinses at least once/day (twice per day if having sinus congestion and pain).  Use either the Neti-pot or Sinus Rinse Kit ([facility] makes ones).  Continue to use the Flonase daily (2 sprays into each nostril, gentle sniffs).  Given the ongoing/recurrent sinus problems over the last few months, please be sure to check with [facility] regarding proper cleaning of your CPAP, to make sure this isn't contributing. I think we should do a CT of the sinuses at the end of your current antibiotic.  It may be that you haven't been fully clearing the infection, and a longer course of antibiotics for a chronic sinus infection is needed.  If the CT shows defomities (in the sinuses, nasal turbinates), then an ENT consult might be indicated, to see if you would benefit from any sinus surgery/procedures.

## 2016-02-23 NOTE — Progress Notes (Signed)
Chief Complaint  Patient presents with  . Advice Only    needs face to face for CPAP-compliance.   Marland Kitchen Headache    has been having headaches lately and would like to discuss.    Patient presents for face to face visit for CPAP.   She reports good compliance with use of her CPAP.  Missed it once this week after returning from a trip and fell asleep before unpacking it.  She tolerates it well, able to wear it the full night without any problems.  She feels more refreshed in the mornings since being back on the CPAP. Energy seems to be better during the day.  Used it 26/30 days (87%) per last compliance report (Aug-Sept)--didn't take it with her for a funeral in [place], shortly after starting back on CPAP.  CPAP titration study was done 11/12/15: IMPRESSIONS - The optimal PAP pressure was 10 cm of water. - Central sleep apnea was not noted during this titration (CAI =  0.0/h). - Mild oxygen desaturations were observed during this titration  (min O2 = 88.00%). - No snoring was audible during this study. - No cardiac abnormalities were observed during this study. - Mild periodic limb movements were observed during this study.  Arousals associated with PLMs were rare.   Allergies and sinuses have been flaring x 3 weeks.  She had been taking loratidine without benefit. She went to [facility] 9/23 with sinus pain, diagnosed with infection and was prescribed Septra DS and Fluticasone nasal spray. While on this antibiotic, she got better for just a few days, then she developed severe headache (behind both eyes and posteriorly), so she went to [facility] 2 days ago (10/3).  They changed her antibiotic from Septra to Cefdinir, and she was also prescribed a 6 day course of 106m prednisone.  She was also prescribed zofran and tramadol for her pain and nausea.  Tramadol made her very dizzy x 24 hours, so stopped taking it.  zofran was effective. Never actually vomited; nausea has improved significantly.   Headache is significantly improved (from a 9.8/10 down to a 1), mostly now just between her eyes.  Nasal mucus is clear.  Denies cough or sore throat.  She has some intermittent chest congestion.  She has reported having multiple sinus infections over the last few months--she thinks it is about every other week, maybe 6 times.  She has been treated with multiple courses of cefdinir, Septra, Z-pak, prednisone one other time.  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  PMH, PSH, SH reviewed  Social History   Social History  . Marital status: Married    Spouse name: N/A  . Number of children: N/A  . Years of education: N/A   Occupational History  . Not on file.   Social History Main Topics  . Smoking status: Former Smoker    Quit date: 01/28/1996  . Smokeless tobacco: Never Used  . Alcohol use Yes     Comment: glass of wine or beer maybe once a month.  . Drug use: No  . Sexual activity: Not Currently    Partners: Male   Other Topics Concern  . Not on file   Social History Narrative   Works at [employer] [employer], no longer at [employer](changed jobs 11/2015).   Lives with husband, 3 dogs, 1 cat  Outpatient Encounter Prescriptions as of 02/23/2016  Medication Sig Note  . alendronate (FOSAMAX) 70 MG tablet TAKE 1 TABLET EVERY SEVEN DAYS WITH A FULL GLASS OF WATER ON AN EMPTY STOMACH   . Calcium Carbonate-Vitamin D (CALTRATE 600+D) 600-400 MG-UNIT per tablet Take 1 tablet by mouth 2 (two) times daily.    . cefdinir (OMNICEF) 300 MG capsule Take 300 mg by mouth 2 (two) times daily.  02/07/2016: Received from: External Pharmacy  . cholecalciferol (VITAMIN D) 1000 UNITS tablet Take 1,000 Units by mouth daily.   01/28/2014: Taking every other day  . lisinopril (PRINIVIL,ZESTRIL) 10 MG tablet Take 1 tablet (10 mg total) by mouth daily.   . Multiple Vitamins-Minerals  (MULTIVITAMIN WITH MINERALS) tablet Take 1 tablet by mouth daily.     . Omega-3 Fatty Acids (FISH OIL) 1200 MG CAPS Take 1 capsule by mouth daily. 2 nightly   . omeprazole (PRILOSEC OTC) 20 MG tablet Take 1 capsule up to twice daily for the next 1-2 weeks   . PARoxetine (PAXIL) 40 MG tablet Take 1 tablet (40 mg total) by mouth every morning.   . predniSONE (DELTASONE) 20 MG tablet Take 40 mg by mouth daily with breakfast.  02/07/2016: Received from: External Pharmacy  . RESTASIS 0.05 % ophthalmic emulsion INSTILL 1 DROP IN [facility] EYE 2 TIMES DAILY 02/03/2015: Received from: External Pharmacy  . simvastatin (ZOCOR) 20 MG tablet TAKE 1 TABLET AT BEDTIME   . vitamin C (ASCORBIC ACID) 500 MG tablet Take 500 mg by mouth daily.     . vitamin E 400 UNIT capsule Take 400 Units by mouth daily.     Marland Kitchen zolpidem (AMBIEN CR) 12.5 MG CR tablet TAKE 1 TABLET BY MOUTH AT BEDTIME AS NEEDED   . acetaminophen (TYLENOL) 500 MG tablet Take 1,000 mg by mouth every 6 (six) hours as needed. Reported on 08/03/2015   . fluconazole (DIFLUCAN) 100 MG tablet Take 1 tablet (100 mg total) by mouth daily. (Patient not taking: Reported on 02/23/2016)   . ibuprofen (ADVIL,MOTRIN) 200 MG tablet Take 800 mg by mouth every 8 (eight) hours as needed for pain. Reported on 08/03/2015   . Lysine 1000 MG TABS Take 1 tablet by mouth daily as needed. Reported on 08/03/2015 07/04/2011: AS NEEDED  . ondansetron (ZOFRAN-ODT) 8 MG disintegrating tablet Take 8 mg by mouth every 8 (eight) hours as needed for nausea or vomiting.   . simethicone (MYLICON) 80 MG chewable tablet Chew 80 mg by mouth as needed.   12/07/2015: prn  . traMADol (ULTRAM) 50 MG tablet Take 50 mg by mouth every 6 (six) hours as needed.   . valACYclovir (VALTREX) 1000 MG tablet TAKE 2 TABLETS AT ONSET OF COLD SORE AND REPEAT IN 12 HOURS (4 TABLETS PER EPISODE OF COLD SORE) (Patient not taking: Reported on 02/23/2016) 12/07/2015: prn   No facility-administered encounter medications on file  as of 02/23/2016.    ROS: no fever, chills, vomiting, diarrhea, bleeding, bruising, rash.  +headaches as per HPI, improved, now with slight sinus pressure. Denies sore throat, shortness of breath, significant cough. Nausea resolved.  No chest pain, palpitations, edema or other concerns, except as noted in HPI.  PHYSICAL EXAM: BP 130/78 (BP Location: Right Arm, Patient Position: Sitting, Cuff Size: Normal)   Pulse 72   Ht 4' 9"  (1.448 m)   Wt 154 lb 3.2 oz (69.9 kg)   BMI 33.37 kg/m   Well appearing, pleasant female in no distress HEENT: PERRL, EOMI, conjunctiva and sclera  are clear.  TM's and EAC's normal, blue tube on R.  Sinuses nontender Nasal mucosa is moderately edematous, no erythema, clear mucus seen. OP is clear Neck: no lymphadenopathy Heart: regular rate and rhythm Lungs: clear bilaterally Skin: normal turgor, no rash Psych: normal mood, affect, hygiene and grooming Neuro: alert and oriented, normal strength, gait  ASSESSMENT/PLAN:  OSA on CPAP - doing well, continue regular use  Recurrent sinus infections - check sinus CT when near end of course of current ABX. May need ENT referral if abnl. Discuss CPAP cleaning with [facility]. Start sinus rinses - Plan: CT Maxillofacial WO CM  Need for prophylactic vaccination and inoculation against influenza - Plan: Flu Vaccine QUAD 36+ mos PF IM (Fluarix & Fluzone Quad PF)  Chronic seasonal allergic rhinitis, unspecified trigger - continue flonase, reviewed proper technique. Instructed on sinus rinses    Send copy of note to [facility]   I recommend that you start doing sinus rinses at least once/day (twice per day if having sinus congestion and pain).  Use either the Neti-pot or Sinus Rinse Kit ([facility] makes ones).  Continue to use the Flonase daily (2 sprays into each nostril, gentle sniffs).  Given the ongoing/recurrent sinus problems over the last few months, please be sure to check with [facility]  regarding proper cleaning of your CPAP, to make sure this isn't contributing. I think we should do a CT of the sinuses at the end of your current antibiotic.  It may be that you haven't been fully clearing the infection, and a longer course of antibiotics for a chronic sinus infection is needed.  If the CT shows defomities (in the sinuses, nasal turbinates), then an ENT consult might be indicated, to see if you would benefit from any sinus surgery/procedures.  Sinus CT on 10/13 or 16

## 2016-02-24 ENCOUNTER — Encounter: Payer: Self-pay | Admitting: Family Medicine

## 2016-03-02 ENCOUNTER — Ambulatory Visit
Admission: RE | Admit: 2016-03-02 | Discharge: 2016-03-02 | Disposition: A | Discharge status: Home / Self Care | Payer: 59 | Source: Ambulatory Visit | Attending: Family Medicine | Admitting: Family Medicine

## 2016-03-02 DIAGNOSIS — J329 Chronic sinusitis, unspecified: Secondary | ICD-10-CM

## 2016-03-09 ENCOUNTER — Encounter: Payer: Self-pay | Admitting: Family Medicine

## 2016-03-09 ENCOUNTER — Ambulatory Visit (INDEPENDENT_AMBULATORY_CARE_PROVIDER_SITE_OTHER): Payer: 59 | Admitting: Family Medicine

## 2016-03-09 VITALS — BP 122/70 | HR 84 | Temp 98.3°F | Wt 155.0 lb

## 2016-03-09 DIAGNOSIS — J302 Other seasonal allergic rhinitis: Secondary | ICD-10-CM | POA: Diagnosis not present

## 2016-03-09 DIAGNOSIS — R519 Headache, unspecified: Secondary | ICD-10-CM

## 2016-03-09 DIAGNOSIS — R51 Headache: Secondary | ICD-10-CM

## 2016-03-09 NOTE — Progress Notes (Signed)
   Subjective:    Patient ID: Julie Fischer, female    DOB: 1960-10-19, 55 y.o.   MRN: [id number]  HPI She is here for evaluation of the acute onset of facial pain. She states that it started this morning. She has a long history of difficulty with underlying allergies and sinus infections. She has had a recent CT scan which apparently was negative. She recently finished antibiotics and steroids approximately one week ago. She continues on Claritin and Flonase as well as nasal rinses. She also has underlying OSA and is using a CPAP. He describes headache as frontal in nature in the area of her sinuses as well as occipital but no blurred or double vision, nausea.   Review of Systems     Objective:   Physical Exam Alert and in no distress. Tender to palpation over frontal and ethmoid sinuses. Tympanic membranes and canals are normal. Pharyngeal area is normal. Neck is supple without adenopathy or thyromegaly. Cardiac exam shows a regular sinus rhythm without murmurs or gallops. Lungs are clear to auscultation.        Assessment & Plan:  Sinus headache I discussed her previous history and the fact that presently she has no evidence of an infection and therefore an antibiotic and definitely steroids were not necessary. I recommended that she switch to Rhinocort and take her favorite anti-inflammatory. She has an appointment with Dr. Tomi Bamberger in the near future to discuss this further.

## 2016-03-09 NOTE — Patient Instructions (Signed)
Switch to Nasacort also 2 Aleve twice per day and you can also take Tylenol with

## 2016-03-12 ENCOUNTER — Other Ambulatory Visit: Payer: 59

## 2016-03-12 DIAGNOSIS — Z9884 Bariatric surgery status: Secondary | ICD-10-CM

## 2016-03-12 DIAGNOSIS — I10 Essential (primary) hypertension: Secondary | ICD-10-CM

## 2016-03-12 DIAGNOSIS — Z5181 Encounter for therapeutic drug level monitoring: Secondary | ICD-10-CM

## 2016-03-12 DIAGNOSIS — E119 Type 2 diabetes mellitus without complications: Secondary | ICD-10-CM

## 2016-03-12 LAB — CBC WITH DIFFERENTIAL/PLATELET
BASOS ABS: 47 {cells}/uL (ref 0–200)
Basophils Relative: 1 %
EOS ABS: 235 {cells}/uL (ref 15–500)
Eosinophils Relative: 5 %
HCT: 32.6 % — ABNORMAL LOW (ref 35.0–45.0)
Hemoglobin: 10.8 g/dL — ABNORMAL LOW (ref 11.7–15.5)
LYMPHS PCT: 41 %
Lymphs Abs: 1927 cells/uL (ref 850–3900)
MCH: 28.6 pg (ref 27.0–33.0)
MCHC: 33.1 g/dL (ref 32.0–36.0)
MCV: 86.5 fL (ref 80.0–100.0)
MONOS PCT: 10 %
MPV: 8.8 fL (ref 7.5–12.5)
Monocytes Absolute: 470 cells/uL (ref 200–950)
Neutro Abs: 2021 cells/uL (ref 1500–7800)
Neutrophils Relative %: 43 %
PLATELETS: 267 10*3/uL (ref 140–400)
RBC: 3.77 MIL/uL — ABNORMAL LOW (ref 3.80–5.10)
RDW: 14 % (ref 11.0–15.0)
WBC: 4.7 10*3/uL (ref 4.0–10.5)

## 2016-03-12 LAB — TSH: TSH: 0.91 m[IU]/L

## 2016-03-13 LAB — COMPREHENSIVE METABOLIC PANEL
ALBUMIN: 3.3 g/dL — AB (ref 3.6–5.1)
ALT: 22 U/L (ref 6–29)
AST: 18 U/L (ref 10–35)
Alkaline Phosphatase: 43 U/L (ref 33–130)
BILIRUBIN TOTAL: 0.3 mg/dL (ref 0.2–1.2)
BUN: 10 mg/dL (ref 7–25)
CALCIUM: 8.6 mg/dL (ref 8.6–10.4)
CO2: 29 mmol/L (ref 20–31)
CREATININE: 0.41 mg/dL — AB (ref 0.50–1.05)
Chloride: 106 mmol/L (ref 98–110)
Glucose, Bld: 131 mg/dL — ABNORMAL HIGH (ref 65–99)
Potassium: 4.1 mmol/L (ref 3.5–5.3)
Sodium: 142 mmol/L (ref 135–146)
TOTAL PROTEIN: 5.5 g/dL — AB (ref 6.1–8.1)

## 2016-03-13 LAB — MICROALBUMIN / CREATININE URINE RATIO
Creatinine, Urine: 101 mg/dL (ref 20–320)
Microalb Creat Ratio: 8 mcg/mg creat (ref <30)
Microalb, Ur: 0.8 mg/dL

## 2016-03-13 LAB — LIPID PANEL
CHOL/HDL RATIO: 2.8 ratio (ref <=5.0)
Cholesterol: 113 mg/dL — ABNORMAL LOW (ref 125–200)
HDL: 40 mg/dL — ABNORMAL LOW (ref >=46)
LDL Cholesterol: 48 mg/dL (ref <130)
Triglycerides: 124 mg/dL (ref <150)
VLDL: 25 mg/dL (ref <30)

## 2016-03-13 LAB — VITAMIN B12: VITAMIN B 12: 731 pg/mL (ref 200–1100)

## 2016-03-13 LAB — VITAMIN D 25 HYDROXY (VIT D DEFICIENCY, FRACTURES): Vit D, 25-Hydroxy: 32 ng/mL (ref 30–100)

## 2016-03-13 LAB — HEMOGLOBIN A1C
Hgb A1c MFr Bld: 7.9 % — ABNORMAL HIGH (ref <5.7)
Mean Plasma Glucose: 180 mg/dL

## 2016-03-13 NOTE — Progress Notes (Signed)
Chief Complaint  Patient presents with  . Annual Exam    non fasting annual exam with pelvic. Did not do eye exam, she has one scheduled with Dr. Sharren Bridge. Still having diarrhea (had been going on 5-6 weeks).     Julie Fischer is a 55 y.o. female who presents for a complete physical.  She has the following concerns:  Diarrhea x 5-6 weeks. Having urgency, and accidents.  Stools are loose, some completely watery. She sees some mucus in the stool later in the evening.  Denies blood in the stool. Gas is foul smelling. Denies abdominal pain, fever. Taking probiotics without benefit (x2-3 weeks).  Originally started probiotics originally to help with chronic constipation. Denies camping, drinking stream water, travel, undercooked/raw/spoiled meats/foods. No sick contacts. Has continued to eat dairy (grilled cheese sandiwiches).  OSA--doing well on CPAP. Compliant with daily use.  Recurrent sinus headaches.  She saw Dr. Redmond School last week with recurrent sinus pain.  There was no evidence of sinusitis on exam, and she had CT done after last antibiotic course which was normal.  She was switched from Flonase to Nasacort, and told to take Aleve and tylenol as needed for headaches. She has also been doing sinus rinses daily. Symptoms have improved; denies discolored mucus. Currently denies sinus pain or pressure.  Hypertension follow-up: Hasn't been checking blood pressures at home recently, had all been normal. Denies dizziness, headaches, chest pain. Denies side effects of medications, no cough.   Depression: Moods seem to be well controlled on the current dose of paroxetine. Some days feels more down than others, but overall doing okay. Only has about 2-3 bad days/month, better than in the past. Currently she is unhappy with her job (hours of 3:30-midnight; not getting to see her husband). Requesting change to day shift. Denies SI/HI. Denies side effects of medications.   Hyperlipidemia follow-up:  Patient is reportedly following a low-fat, low cholesterol diet. Compliant with medications (simvastatin and fish oil) and denies medication side effects.   Osteoporosis: She is compliant with taking fosamax weekly. Denies side effects. Denies chest pain or dysphagia, occasional heartburn not related to the medication. Last DEXA was 08/2014 which showed T-2.0 at spine, -1.6 at left hip. 10% improvement of L hip bone density, and 11% increase in the spine.  Insomnia: requires Ambien CR nightly. No unusual behavior (keeps phone downstairs, not sending any texts late). It works well for her. She doesn't sleep at all if she doesn't take it, "the brain won't shut up".  H/o DM, changed to impaired fasting glucose (improved after bariatric surgery and weight loss). She had regained weight and last A1c was up to 6.6% in September 2016. She denies polydipsia, polyuria, vision changes, numbness or tingling. She still isn't getting any regular exercise. Last eye exam a year ago, scheduled in the near future. She has had multiple courses of steroids in the last few months (for bronchitis and sinusitis). She has been having diarrhea, so diet has changed some--snacking less.  No other change in diet.  H/o breast cancer: She is no longer under the care of oncologist (since Dr. Truddie Coco left--now is in [place], and she would go up to [facility] to see him if there are any recurrences or problems). Last mammogram was in 01/2015.  She checks her breasts regularly.  She has some intermittent tenderness around her scar (chronic, unchanged).  No changes noted to memory, same as per last visit (losing things--phone, remote control, papers. Has missed turns from spacing out  while driving, but not really getting lost. Some trouble with names, no trouble with word finding). No significant issues have occurred. Trying to keep routines to help with finding the remote.  Immunization History  Administered Date(s) Administered  .  Influenza Split 02/07/2009, 04/10/2010, 04/19/2011, 01/28/2012  . Influenza,inj,Quad PF,36+ Mos 01/28/2013, 01/28/2014, 02/03/2015, 02/23/2016  . Pneumococcal Polysaccharide-23 06/04/2001, 11/15/2010  . Td 06/04/2001  . Tdap 11/15/2010   Last Pap smear: s/p hysterectomy  Last mammogram: 01/2015  Last colonoscopy: 12/2010  Last DEXA: 08/2014 T-2.0 at spine, -1.6 at L hip (significantly improved from 2012). Ophtho: yearly, Last went a couple of weeks ago Dentist: twice yearly  Exercise: nothing outside of work.  Past Medical History:  Diagnosis Date  . Allergy    fall seasonal  . Anxiety   . Blood transfusion    Platelet transfusion when on heparin  . Breast cancer ([place]) 2001   R breast(lumpectomy,chemo,radiation,tamoxifen)  . Depression   . Diabetes mellitus    resolved after weight loss surgery; recurred 2017  . Hyperlipidemia   . Hypertension    resolved after weight loss surgery; recurred 2013  . Insomnia chronic  . Iron deficiency    h/o  . Microalbuminuria    h/o  . OA (osteoarthritis) of knee   . Osteoporosis   . Plantar fasciitis (07') DrRegal   s/p surgical release 01/2011  . Sleep apnea    resolved after weight loss surgery; recurrent with weight gain; on CPAP  . Vitamin D deficiency     Past Surgical History:  Procedure Laterality Date  . ABDOMINAL HYSTERECTOMY  2000   and RSO(endometriosis)  . APPENDECTOMY    . BREAST LUMPECTOMY Right  11/1999  . COLONOSCOPY  2002, 2012  . cuboid stress fracture Right 12/2002  . FOOT SURGERY Left 2008  . GASTRIC ROUX-EN-Y  (DrNewman) 3/04  . left shoulder fracture repair  (DrMortensen) 12/05  . MYRINGOTOMY     tubes B/L, multiple sets  . plantar fasciitis release Right 01/30/11   Dr. Paulla Dolly  . TONSILLECTOMY AND ADENOIDECTOMY      Social History   Social History  . Marital status: Married    Spouse name: N/A  . Number of children: N/A  . Years of education: N/A   Occupational History  . Not on file.    Social History Main Topics  . Smoking status: Former Smoker    Quit date: 01/28/1996  . Smokeless tobacco: Never Used  . Alcohol use Yes     Comment: glass of wine or beer maybe once a month.  . Drug use: No  . Sexual activity: Not Currently    Partners: Male   Other Topics Concern  . 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   Lives with husband, 3 dogs    Family History  Problem Relation Age of Onset  . Dementia Mother   . Stroke Mother 103    due to aneurysm  . Macular degeneration Mother   . Cancer Mother     colon cancer  . Colon cancer Mother 46  . Pancreatic cancer Father   . Cancer Father   . Hyperlipidemia Sister   . Hyperlipidemia Brother   . Diabetes Brother   . Hypertension Brother   . Hepatitis C Brother   . Alcohol abuse Brother   . Drug abuse Brother   . Diabetes  Maternal Grandmother   . Hyperlipidemia Sister   . Breast cancer Cousin   . Breast cancer Cousin     Outpatient Encounter Prescriptions as of 03/15/2016  Medication Sig Note  . alendronate (FOSAMAX) 70 MG tablet TAKE 1 TABLET EVERY SEVEN DAYS WITH A FULL GLASS OF WATER ON AN EMPTY STOMACH   . Calcium Carbonate-Vitamin D (CALTRATE 600+D) 600-400 MG-UNIT per tablet Take 1 tablet by mouth 2 (two) times daily.    . cholecalciferol (VITAMIN D) 1000 UNITS tablet Take 1,000 Units by mouth daily.   01/28/2014: Taking every other day  . lisinopril (PRINIVIL,ZESTRIL) 10 MG tablet Take 1 tablet (10 mg total) by mouth daily.   . Multiple Vitamins-Minerals (MULTIVITAMIN WITH MINERALS) tablet Take 1 tablet by mouth daily.     . Omega-3 Fatty Acids (FISH OIL) 1200 MG CAPS Take 1 capsule by mouth daily. 2 nightly   . omeprazole (PRILOSEC OTC) 20 MG tablet Take 1 capsule up to twice daily for the next 1-2 weeks 03/15/2016: Uses prn heartburn or hoarseness (every few weeks)  . PARoxetine (PAXIL)  40 MG tablet Take 1 tablet (40 mg total) by mouth every morning.   . RESTASIS 0.05 % ophthalmic emulsion INSTILL 1 DROP IN [facility] EYE 2 TIMES DAILY 02/03/2015: Received from: External Pharmacy  . simvastatin (ZOCOR) 20 MG tablet TAKE 1 TABLET AT BEDTIME   . vitamin C (ASCORBIC ACID) 500 MG tablet Take 500 mg by mouth daily.     . vitamin E 400 UNIT capsule Take 400 Units by mouth daily.     Marland Kitchen zolpidem (AMBIEN CR) 12.5 MG CR tablet TAKE 1 TABLET BY MOUTH AT BEDTIME AS NEEDED   . acetaminophen (TYLENOL) 500 MG tablet Take 1,000 mg by mouth every 6 (six) hours as needed. Reported on 08/03/2015   . ibuprofen (ADVIL,MOTRIN) 200 MG tablet Take 800 mg by mouth every 8 (eight) hours as needed for pain. Reported on 08/03/2015   . Lysine 1000 MG TABS Take 1 tablet by mouth daily as needed. Reported on 08/03/2015 07/04/2011: AS NEEDED  . ondansetron (ZOFRAN-ODT) 8 MG disintegrating tablet Take 8 mg by mouth every 8 (eight) hours as needed for nausea or vomiting.   . simethicone (MYLICON) 80 MG chewable tablet Chew 80 mg by mouth as needed.   12/07/2015: prn  . valACYclovir (VALTREX) 1000 MG tablet TAKE 2 TABLETS AT ONSET OF COLD SORE AND REPEAT IN 12 HOURS (4 TABLETS PER EPISODE OF COLD SORE) (Patient not taking: Reported on 03/15/2016) 12/07/2015: prn  . [DISCONTINUED] cefdinir (OMNICEF) 300 MG capsule Take 300 mg by mouth 2 (two) times daily.  02/23/2016: Started 10/3 from urgent care  . [DISCONTINUED] fluconazole (DIFLUCAN) 100 MG tablet Take 1 tablet (100 mg total) by mouth daily. (Patient not taking: Reported on 03/09/2016)   . [DISCONTINUED] predniSONE (DELTASONE) 20 MG tablet Take 40 mg by mouth daily with breakfast.  02/07/2016: Received from: External Pharmacy   No facility-administered encounter medications on file as of 03/15/2016.     Allergies  Allergen Reactions  . Heparin Other (See Comments)    Low platelets  . Niacin And Related Other (See Comments)    Blotchy/itchy  . Tessalon [Benzonatate]  Diarrhea  . Tramadol Other (See Comments)    Dizziness (lasted 24 hours after taking 2 doses)  . Penicillins Rash    ROS: The patient denies anorexia, weight changes, fever, headaches, vision changes, decreased hearing, ear pain, sore throat, chest pain, palpitations, dizziness, syncope, dyspnea on exertion, cough,  swelling, nausea, vomiting, constipation, abdominal pain, melena, hematochezia, hematuria, incontinence, dysuria, vaginal bleeding, discharge, odor or itch, genital lesions, numbness, tingling, weakness, tremor, suspicious skin lesions, abnormal bleeding/bruising, or enlarged lymph nodes.  Chronic insomnia. Moods well controlled as per HPI. Some occasional hip pain with walking at the mall. No longer having feet or knee pain. Sporadic popping of left knee. Some postnasal drainage with sore throat at night. Recent sinus problems--currently asymptomatic. Has some mild burning with urination.  No urgency/frequency.    PHYSICAL EXAM:  BP 132/86 (BP Location: Left Arm, Patient Position: Sitting, Cuff Size: Normal)   Pulse 76   Ht 4' 8.5" (1.435 m)   Wt 156 lb 9.6 oz (71 kg)   BMI 34.49 kg/m    General Appearance:  Alert, cooperative, no distress, appears stated age   Head:  Normocephalic, without obvious abnormality, atraumatic   Eyes:  PERRL, conjunctiva/corneas clear, EOM's intact, fundi  benign   Ears:  Normal TM's and external ear canals. Blue PE tube noted in right ear   Nose:  Nasal mucosa mildly edematous. Sinuses nontender   Throat:  Lips, mucosa, and tongue normal; teeth and gums normal   Neck:  Supple, no lymphadenopathy; thyroid: no enlargement/tenderness/nodules; no carotid  bruit or JVD   Back:  Spine nontender, no curvature, ROM normal. No CVA tenderness  Lungs:  Clear to auscultation bilaterally without wheezes, rales or ronchi; respirations unlabored   Chest Wall:  No tenderness or deformity   Heart:  Regular rate and rhythm, S1 and  S2 normal, no murmur, rub or gallop   Breast Exam:  R breast--s/p surgery with scarring and some skin changes related to previous radiation (telangiectasias). No focal mass. No nipple discharge or inversion. L breast exam is entirely normal. No axillary lymphadenopathy.  Abdomen:  Soft, nondistended, normoactive bowel sounds,  no masses, no hepatosplenomegaly   Genitalia:  Normal external genitalia without lesions. BUS and vagina normal; Bimanual exam revealed surgically absent uterus. No adnexal masses. No mass, rebound tenderness or guarding   Rectal:  Normal tone, no masses or tenderness; guaiac negative stool   Extremities:  No clubbing, cyanosis or edema. No lesions. Normal diabetic foot exam.  Pulses:  2+ and symmetric all extremities   Skin:  Skin color, texture, turgor normal, no rashes or lesions   Lymph nodes:  Cervical, supraclavicular, and axillary nodes normal   Neurologic:  CNII-XII intact, normal strength, sensation and gait; reflexes 2+ and symmetric throughout    Psych: Normal mood, affect, hygiene and grooming  Lab Results  Component Value Date   HGBA1C 7.9 (H) 03/12/2016   Fasting glucose 131. Rest of chem normal.  Lab Results  Component Value Date   TSH 0.91 03/12/2016   Lab Results  Component Value Date   WBC 4.7 03/12/2016   HGB 10.8 (L) 03/12/2016   HCT 32.6 (L) 03/12/2016   MCV 86.5 03/12/2016   PLT 267 03/12/2016   Urine microalbumin/Cr ratio normal (8).  Vitamin D-OH 32 Vitamin B12 731  Lab Results  Component Value Date   CHOL 113 (L) 03/12/2016   HDL 40 (L) 03/12/2016   LDLCALC 48 03/12/2016   TRIG 124 03/12/2016   CHOLHDL 2.8 03/12/2016     ASSESSMENT/PLAN:  Annual physical exam  Type 2 diabetes mellitus without complication, without long-term current use of insulin (HCC) - significant worsening of control since last check, like related to steroids, illness.  Restart Metformin.  Risks/side effects reviewed -  Plan: metFORMIN (GLUCOPHAGE-XR) 500 MG  24 hr tablet  Pure hypercholesterolemia - lipids at goal, continue current regimen  Depression, major, in remission ([place]) - Plan: PARoxetine (PAXIL) 40 MG tablet  Osteoporosis, unspecified osteoporosis type, unspecified pathological fracture presence - continue alendronate, calcium, D, weight-bearing exercise, and recheck DEXA 08/2016 - Plan: alendronate (FOSAMAX) 70 MG tablet  Essential hypertension, benign - controlled - Plan: lisinopril (PRINIVIL,ZESTRIL) 10 MG tablet  OSA on CPAP - continue CPAP.  Weight loss encouraged  Insomnia, unspecified type  Diarrhea, unspecified type - recent ABX. check stool studies including c-diff.  no imodium until c.diff results back. Continue probiotics; avoid dairy  Immunization due - Plan: Pneumococcal polysaccharide vaccine 23-valent greater than or equal to 2yo subcutaneous/IM   Discussed the mild, normocytic anemia noted recently. ?if related to diarrhea.  Await stool studies.  No other source of bleeding, asymptomatic.  Low serum protein--encouraged her to eat more protein in diet  Increase vitamin D to daily over the winter. (levels on the low side, and will likely drop in the winter)   Restart Metformin. Take once daily with breakfast or dinner.  Increase to 2 daily after a week if tolerated. Take metamucil if needed to help bulk up the stools. Avoid use of imodium until we know the C.diff results (you may use imodium as needed if C.diff testing is negative).  DEXA due 08/2016 mammo need to be scheduled (past due) Pneumovax today  Discussed monthly self breast exams and yearly mammograms (past due); at least 30 minutes of aerobic activity at least 5 days/week, weight-bearing exercise at least 2x/wk; proper sunscreen use reviewed; healthy diet, including goals of calcium and vitamin D intake and alcohol recommendations (less than or equal to 1 drink/day) reviewed; regular seatbelt use; changing batteries  in smoke detectors. Immunization recommendations discussed--UTD, flu shot given. Colonoscopy recommendations reviewed, UTD --due again now (5 year f/u) due to family h/o colon cancer (mother). She was advised to call her GI to schedule.  F/u 3 months (A1c at visit).

## 2016-03-15 ENCOUNTER — Ambulatory Visit (INDEPENDENT_AMBULATORY_CARE_PROVIDER_SITE_OTHER): Payer: 59 | Admitting: Family Medicine

## 2016-03-15 ENCOUNTER — Encounter: Payer: Self-pay | Admitting: Family Medicine

## 2016-03-15 VITALS — BP 132/86 | HR 76 | Ht <= 58 in | Wt 156.6 lb

## 2016-03-15 DIAGNOSIS — M810 Age-related osteoporosis without current pathological fracture: Secondary | ICD-10-CM | POA: Diagnosis not present

## 2016-03-15 DIAGNOSIS — Z9989 Dependence on other enabling machines and devices: Secondary | ICD-10-CM | POA: Diagnosis not present

## 2016-03-15 DIAGNOSIS — E119 Type 2 diabetes mellitus without complications: Secondary | ICD-10-CM

## 2016-03-15 DIAGNOSIS — G4700 Insomnia, unspecified: Secondary | ICD-10-CM

## 2016-03-15 DIAGNOSIS — I10 Essential (primary) hypertension: Secondary | ICD-10-CM | POA: Diagnosis not present

## 2016-03-15 DIAGNOSIS — R197 Diarrhea, unspecified: Secondary | ICD-10-CM | POA: Diagnosis not present

## 2016-03-15 DIAGNOSIS — Z23 Encounter for immunization: Secondary | ICD-10-CM

## 2016-03-15 DIAGNOSIS — E7800 Pure hypercholesterolemia, unspecified: Secondary | ICD-10-CM | POA: Diagnosis not present

## 2016-03-15 DIAGNOSIS — F325 Major depressive disorder, single episode, in full remission: Secondary | ICD-10-CM | POA: Diagnosis not present

## 2016-03-15 DIAGNOSIS — G4733 Obstructive sleep apnea (adult) (pediatric): Secondary | ICD-10-CM

## 2016-03-15 DIAGNOSIS — Z0000 Encounter for general adult medical examination without abnormal findings: Secondary | ICD-10-CM | POA: Diagnosis not present

## 2016-03-15 MED ORDER — METFORMIN HCL ER 500 MG PO TB24: ORAL_TABLET | ORAL | 1 refills | Status: DC

## 2016-03-15 MED ORDER — PAROXETINE HCL 40 MG PO TABS: 40.0000 mg | ORAL_TABLET | Freq: Every morning | ORAL | 3 refills | Status: DC

## 2016-03-15 MED ORDER — LISINOPRIL 10 MG PO TABS: 10.0000 mg | ORAL_TABLET | Freq: Every day | ORAL | 3 refills | Status: DC

## 2016-03-15 MED ORDER — ALENDRONATE SODIUM 70 MG PO TABS: ORAL_TABLET | ORAL | 3 refills | Status: DC

## 2016-03-15 NOTE — Patient Instructions (Addendum)
  HEALTH MAINTENANCE RECOMMENDATIONS:  It is recommended that you get at least 30 minutes of aerobic exercise at least 5 days/week (for weight loss, you may need as much as 60-90 minutes). This can be any activity that gets your heart rate up. This can be divided in 10-15 minute intervals if needed, but try and build up your endurance at least once a week.  Weight bearing exercise is also recommended twice weekly.  Eat a healthy diet with lots of vegetables, fruits and fiber.  "Colorful" foods have a lot of vitamins (ie green vegetables, tomatoes, red peppers, etc).  Limit sweet tea, regular sodas and alcoholic beverages, all of which has a lot of calories and sugar.  Up to 1 alcoholic drink daily may be beneficial for women (unless trying to lose weight, watch sugars).  Drink a lot of water.  Calcium recommendations are 1200-1500 mg daily (1500 mg for postmenopausal women or women without ovaries), and vitamin D 1000 IU daily.  This should be obtained from diet and/or supplements (vitamins), and calcium should not be taken all at once, but in divided doses.  Monthly self breast exams and yearly mammograms for women over the age of 24 is recommended.  Sunscreen of at least SPF 30 should be used on all sun-exposed parts of the skin when outside between the hours of 10 am and 4 pm (not just when at beach or pool, but even with exercise, golf, tennis, and yard work!)  Use a sunscreen that says "broad spectrum" so it covers both UVA and UVB rays, and make sure to reapply every 1-2 hours.  Remember to change the batteries in your smoke detectors when changing your clock times in the spring and fall.  Use your seat belt every time you are in a car, and please drive safely and not be distracted with cell phones and texting while driving.  Take your vitamin D supplement EVERY DAY  Call Dr. Edison Nasuti ( GI) to check on scheduling colonoscopy, which is due (every 5 years due to mother's colon  cancer). Schedule your mammogram.  We will be checking stool studies to evaluate your diarrhea--bring the samples back to the lab as directed with the written prescription you were given.   Restart Metformin. Take once daily with breakfast or dinner.  Increase to 2 daily after a week if tolerated. Take metamucil if needed to help bulk up the stools. Avoid use of imodium until we know the C.diff results (you may use imodium as needed if C.diff testing is negative).   Avoid all dairy for the next 1-2 weeks.  Continue probiotic.

## 2016-03-21 ENCOUNTER — Telehealth: Payer: Self-pay | Admitting: *Deleted

## 2016-03-21 MED ORDER — METRONIDAZOLE 500 MG PO TABS: 500.0000 mg | ORAL_TABLET | Freq: Three times a day (TID) | ORAL | 0 refills | Status: DC

## 2016-03-21 NOTE — Telephone Encounter (Signed)
Patient advised that she was positive for c diff and not to use any immodium and not to drink any ETOH. Called in metronidazole 500mg  #42 TID and patient advised to follow up if not better.

## 2016-03-27 ENCOUNTER — Encounter: Payer: Self-pay | Admitting: Family Medicine

## 2016-04-24 ENCOUNTER — Other Ambulatory Visit: Payer: Self-pay | Admitting: Family Medicine

## 2016-04-24 NOTE — Telephone Encounter (Signed)
Okay to refill #90 

## 2016-04-24 NOTE — Telephone Encounter (Signed)
Is this okay to refill? 

## 2016-04-29 ENCOUNTER — Other Ambulatory Visit: Payer: Self-pay | Admitting: Family Medicine

## 2016-04-29 DIAGNOSIS — E7800 Pure hypercholesterolemia, unspecified: Secondary | ICD-10-CM

## 2016-05-21 DIAGNOSIS — G4733 Obstructive sleep apnea (adult) (pediatric): Secondary | ICD-10-CM | POA: Diagnosis not present

## 2016-06-13 NOTE — Progress Notes (Signed)
Chief Complaint  Patient presents with  . Hypertension    and IFG, no fasting med check.  . Cough    slight ST and some drainage, gets worse as day goes on-started 3-4 days. No fevers, no discolored mucus.    She is complaining of cough and sore throat x 3-4 days.  Sore throat is worse with talking.  Using neti-pot, drainage is clear-white.  Cough is nonproductive. No fever or chills, no body aches.  Slight back pain (lifted heavy tub last week, flared it up).  Slight shortness of breath walking through the parking lot.  She hasn't been getting much regular exercise recently.  Patient presents to follow up on Diabetes. She was started back on Metformin at her last visit in October. Her A1c had jumped to 7.9, after having been 6.4 in 07/2015. She is taking 1064m without any side effects.  Sugars are running 120-130.  Lab Results  Component Value Date   HGBA1C 7.9 (H) 03/12/2016    She was also found to have C.diff, when complaining of 5-6 weeks of diarrhea at that same visit. She was treated with a course of flagyl. She reports diarrhea has cleared up over the last 2-3 weeks, now having formed stools. She had ongoing diarrhea until then, really didn't notice much improvement during the course of antibiotics/flagyl.  Denies abdominal pain, occasional bloating.  Denies blood in the stool.  All formed stools (maybe one loose one) in the last 2-3 weeks--loose if she eats salad. (she had always responded this way to salad, cleared up her constipation).   PMH, PSH, SH reviewed  Outpatient Encounter Prescriptions as of 06/14/2016  Medication Sig Note  . alendronate (FOSAMAX) 70 MG tablet TAKE 1 TABLET EVERY SEVEN DAYS WITH A FULL GLASS OF WATER ON AN EMPTY STOMACH   . Calcium Carbonate-Vitamin D (CALTRATE 600+D) 600-400 MG-UNIT per tablet Take 1 tablet by mouth 2 (two) times daily.    . cholecalciferol (VITAMIN D) 1000 UNITS tablet Take 1,000 Units by mouth daily.   01/28/2014: Taking every other day   . lisinopril (PRINIVIL,ZESTRIL) 10 MG tablet Take 1 tablet (10 mg total) by mouth daily.   . metFORMIN (GLUCOPHAGE-XR) 500 MG 24 hr tablet Start at 1 tablet by mouth with breakfast or dinner once daily. After 1 week, increase to 2 tablets daily 06/14/2016: Taking 2 daily  . Multiple Vitamins-Minerals (MULTIVITAMIN WITH MINERALS) tablet Take 1 tablet by mouth daily.     . Omega-3 Fatty Acids (FISH OIL) 1200 MG CAPS Take 1 capsule by mouth daily. 2 nightly   . PARoxetine (PAXIL) 40 MG tablet Take 1 tablet (40 mg total) by mouth every morning.   . [facility]IS 0.05 % [facility]: Received from: External Pharmacy  . simvastatin (ZOCOR) 20 MG tablet TAKE 1 TABLET AT BEDTIME   . vitamin C (ASCORBIC ACID) 500 MG tablet Take 500 mg by mouth daily.     . vitamin E 400 UNIT capsule Take 400 Units by mouth daily.     .Marland Kitchenzolpidem (AMBIEN CR) 12.5 MG CR tablet take 1 tablet by mouth at bedtime if needed for sleep   . acetaminophen (TYLENOL) 500 MG tablet Take 1,000 mg by mouth every 6 (six) hours as needed. Reported on 08/03/2015   . ibuprofen (ADVIL,MOTRIN) 200 MG tablet Take 800 mg by mouth every 8 (eight) hours as needed for pain. Reported on 08/03/2015   . Lysine 1000 MG TABS  Take 1 tablet by mouth daily as needed. Reported on 08/03/2015 07/04/2011: AS NEEDED  . omeprazole (PRILOSEC OTC) 20 MG tablet Take 1 capsule up to twice daily for the next 1-2 weeks (Patient not taking: Reported on 06/14/2016) 03/15/2016: Uses prn heartburn or hoarseness (every few weeks)  . ondansetron (ZOFRAN-ODT) 8 MG disintegrating tablet Take 8 mg by mouth every 8 (eight) hours as needed for nausea or vomiting.   . simethicone (MYLICON) 80 MG chewable tablet Chew 80 mg by mouth as needed.   12/07/2015: prn  . valACYclovir (VALTREX) 1000 MG tablet TAKE 2 TABLETS AT ONSET OF COLD SORE AND REPEAT IN 12 HOURS (4 TABLETS PER EPISODE OF COLD SORE) (Patient not taking: Reported on  03/15/2016) 12/07/2015: prn  . [DISCONTINUED] metroNIDAZOLE (FLAGYL) 500 MG tablet Take 1 tablet (500 mg total) by mouth 3 (three) times daily.    No facility-administered encounter medications on file as of 06/14/2016.    Allergies  Allergen Reactions  . Heparin Other (See Comments)    Low platelets  . Niacin And Related Other (See Comments)    Blotchy/itchy  . Tessalon [Benzonatate] Diarrhea  . Tramadol Other (See Comments)    Dizziness (lasted 24 hours after taking 2 doses)  . Penicillins Rash    ROS: no headaches, dizziness, chest pain, palpitations, nausea, vomiting; bowels improved. No urinary complaints, bleeding, bruising, rash.  Moods are good overall.  See HPI.  PHYSICAL EXAM:  BP 116/80 (BP Location: Right Arm, Patient Position: Sitting, Cuff Size: Normal)   Pulse 72   Temp 98.3 F (36.8 C) (Oral)   Ht 4' 8.5" (1.435 m)   Wt 151 lb 6.4 oz (68.7 kg)   BMI 33.35 kg/m   Well appearing, pleasant female in no distress HEENT: PERRL, EOMI, conjunctiva and sclera are clear. Nasal mucosa mildly edematous with clear mucus on the left, no drainage or purulence on the right.  Sinuses nontender. OP is clear. Blue PE tube on the right, normal TM and EAC on the left Neck: no lymphadenopathy, thyromegaly or mass, no bruit Heart: regular rate and rhythm Lungs: clear bilaterally Abdomen: soft, nontender, normal bowel sounds, no mass Extremities: no edema, normal pulses Psych: normal mood, affect, hygiene and grooming Neuro: alert and oriented, cranial nerves intact, normal gait.   Lab Results  Component Value Date   HGBA1C 6.7 06/14/2016     ASSESSMENT/PLAN:  Type 2 diabetes mellitus without complication, without long-term current use of insulin ([place]) - improved on metformin--continue. Encouraged regular exercise and weight loss - Plan: HgB A1c  Diarrhea, unspecified type - due to c.diff.  Resolved (although took longer than expected). Advised to contact us if diarrhea  recurs  Acute upper respiratory infection - no evidence of bacterial infection.  supportive measures reviewed.  Essential hypertension, benign - well controlled    F/u in April as scheduled with labs prior--Lipid, c-met, A1c    Please let us know if you have recurrent diarrhea.  We will need to recheck for recurrent C.Diff infection.

## 2016-06-14 ENCOUNTER — Encounter: Payer: Self-pay | Admitting: Family Medicine

## 2016-06-14 ENCOUNTER — Ambulatory Visit (INDEPENDENT_AMBULATORY_CARE_PROVIDER_SITE_OTHER): Payer: 59 | Admitting: Family Medicine

## 2016-06-14 VITALS — BP 116/80 | HR 72 | Temp 98.3°F | Ht <= 58 in | Wt 151.4 lb

## 2016-06-14 DIAGNOSIS — R197 Diarrhea, unspecified: Secondary | ICD-10-CM | POA: Diagnosis not present

## 2016-06-14 DIAGNOSIS — E119 Type 2 diabetes mellitus without complications: Secondary | ICD-10-CM

## 2016-06-14 DIAGNOSIS — I10 Essential (primary) hypertension: Secondary | ICD-10-CM

## 2016-06-14 DIAGNOSIS — J069 Acute upper respiratory infection, unspecified: Secondary | ICD-10-CM

## 2016-06-14 DIAGNOSIS — Z5181 Encounter for therapeutic drug level monitoring: Secondary | ICD-10-CM

## 2016-06-14 LAB — POCT GLYCOSYLATED HEMOGLOBIN (HGB A1C): Hemoglobin A1C: 6.7

## 2016-06-14 NOTE — Patient Instructions (Addendum)
Continue your current medications.  Continue the 1000mg  (2 pills) of the metformin. Try and get regular exercise as we discussed (150 minutes of aerobic exercise each week, weight-bearing 2x/week).  You may use coricidin if needed for the cold symptoms, as well as tylenol and salt water gargles for the sore throat.  Let us know if you develop discolored mucus, fever, worsening shortness of breath or other new symptoms.  Please let us know if you have recurrent diarrhea.  We will need to recheck for recurrent C.Diff infection.

## 2016-06-15 ENCOUNTER — Encounter: Payer: Self-pay | Admitting: Family Medicine

## 2016-06-21 DIAGNOSIS — G4733 Obstructive sleep apnea (adult) (pediatric): Secondary | ICD-10-CM | POA: Diagnosis not present

## 2016-07-03 DIAGNOSIS — G4733 Obstructive sleep apnea (adult) (pediatric): Secondary | ICD-10-CM | POA: Diagnosis not present

## 2016-07-19 DIAGNOSIS — G4733 Obstructive sleep apnea (adult) (pediatric): Secondary | ICD-10-CM | POA: Diagnosis not present

## 2016-07-22 ENCOUNTER — Other Ambulatory Visit: Payer: Self-pay | Admitting: Family Medicine

## 2016-07-23 NOTE — Telephone Encounter (Signed)
Is this okay to refill? 

## 2016-07-23 NOTE — Telephone Encounter (Signed)
Ok to refill #90.   

## 2016-07-26 ENCOUNTER — Ambulatory Visit
Admission: RE | Admit: 2016-07-26 | Discharge: 2016-07-26 | Disposition: A | Discharge status: Home / Self Care | Payer: 59 | Source: Ambulatory Visit | Attending: Family Medicine | Admitting: Family Medicine

## 2016-07-26 ENCOUNTER — Ambulatory Visit: Payer: 59 | Admitting: Family Medicine

## 2016-07-26 ENCOUNTER — Ambulatory Visit (INDEPENDENT_AMBULATORY_CARE_PROVIDER_SITE_OTHER): Payer: 59 | Admitting: Family Medicine

## 2016-07-26 ENCOUNTER — Encounter: Payer: Self-pay | Admitting: Family Medicine

## 2016-07-26 VITALS — BP 124/72 | HR 84 | Ht <= 58 in | Wt 151.6 lb

## 2016-07-26 DIAGNOSIS — M25562 Pain in left knee: Secondary | ICD-10-CM

## 2016-07-26 NOTE — Progress Notes (Signed)
Chief Complaint  Patient presents with  . Knee Pain    left knee pain ongoing x 6-8 months. Sharp pain. Can even feel when she is laying in bed.    She is complaining of chronic knee pain at the inferiomedial left knee.  Sometimes it is sharp and stabbing, like a toothpick stabbing her.  Movements when sitting can flare it.  It hurts while walking and with stairs.  She has taken Aleve with good (but temporary) results (taking 3 at once, once daily for the last 3-4 months--stopped doing this 2-3 weeks ago). She has also tried topical medication such as [employer], which also helped some.  No known injury, change in activity or change in shoes. She thinks sometimes sees some swelling in that particular area (not in the whole knee).    Denies any locking.  Sometimes it feels like it might give out, but doesn't (just gets a sharp pain).    PMH, PSH, SH reviewed  Outpatient Encounter Prescriptions as of 07/26/2016  Medication Sig Note  . alendronate (FOSAMAX) 70 MG tablet TAKE 1 TABLET EVERY SEVEN DAYS WITH A FULL GLASS OF WATER ON AN EMPTY STOMACH   . Calcium Carbonate-Vitamin D (CALTRATE 600+D) 600-400 MG-UNIT per tablet Take 1 tablet by mouth 2 (two) times daily.    . cholecalciferol (VITAMIN D) 1000 UNITS tablet Take 1,000 Units by mouth daily.   01/28/2014: Taking every other day  . lisinopril (PRINIVIL,ZESTRIL) 10 MG tablet Take 1 tablet (10 mg total) by mouth daily.   . metFORMIN (GLUCOPHAGE-XR) 500 MG 24 hr tablet Start at 1 tablet by mouth with breakfast or dinner once daily. After 1 week, increase to 2 tablets daily 06/14/2016: Taking 2 daily  . Multiple Vitamins-Minerals (MULTIVITAMIN WITH MINERALS) tablet Take 1 tablet by mouth daily.     . naproxen sodium (ANAPROX) 220 MG tablet Take 660 mg by mouth 2 (two) times daily with a meal.   . Omega-3 Fatty Acids (FISH OIL) 1200 MG CAPS Take 1 capsule by mouth daily. 2 nightly   . PARoxetine (PAXIL) 40 MG tablet Take 1 tablet (40 mg total) by  mouth every morning.   . RESTASIS 0.05 % ophthalmic emulsion INSTILL 1 DROP IN [facility] EYE 2 TIMES DAILY 02/03/2015: Received from: External Pharmacy  . simvastatin (ZOCOR) 20 MG tablet TAKE 1 TABLET AT BEDTIME   . vitamin C (ASCORBIC ACID) 500 MG tablet Take 500 mg by mouth daily.     . vitamin E 400 UNIT capsule Take 400 Units by mouth daily.     Marland Kitchen zolpidem (AMBIEN CR) 12.5 MG CR tablet take 1 tablet by mouth at bedtime if needed for sleep   . acetaminophen (TYLENOL) 500 MG tablet Take 1,000 mg by mouth every 6 (six) hours as needed. Reported on 08/03/2015   . ibuprofen (ADVIL,MOTRIN) 200 MG tablet Take 800 mg by mouth every 8 (eight) hours as needed for pain. Reported on 08/03/2015   . Lysine 1000 MG TABS Take 1 tablet by mouth daily as needed. Reported on 08/03/2015 07/04/2011: AS NEEDED  . omeprazole (PRILOSEC OTC) 20 MG tablet Take 1 capsule up to twice daily for the next 1-2 weeks (Patient not taking: Reported on 06/14/2016) 03/15/2016: Uses prn heartburn or hoarseness (every few weeks)  . simethicone (MYLICON) 80 MG chewable tablet Chew 80 mg by mouth as needed.   12/07/2015: prn  . valACYclovir (VALTREX) 1000 MG tablet TAKE 2 TABLETS AT ONSET OF COLD SORE AND REPEAT IN 12  HOURS (4 TABLETS PER EPISODE OF COLD SORE) (Patient not taking: Reported on 03/15/2016) 12/07/2015: prn  . [DISCONTINUED] ondansetron (ZOFRAN-ODT) 8 MG disintegrating tablet Take 8 mg by mouth every 8 (eight) hours as needed for nausea or vomiting.    No facility-administered encounter medications on file as of 07/26/2016.    Allergies  Allergen Reactions  . Heparin Other (See Comments)    Low platelets  . Niacin And Related Other (See Comments)    Blotchy/itchy  . Tessalon [Benzonatate] Diarrhea  . Tramadol Other (See Comments)    Dizziness (lasted 24 hours after taking 2 doses)  . Penicillins Rash   ROS: no fever chills, URI symptoms, bleeding, bruising, nausea, vomiting. Denies any significant GI side effects from  NSAIDs. Denies other joint pains or other concerns.  PHYSICAL EXAM:  BP 124/72 (BP Location: Left Arm, Patient Position: Sitting, Cuff Size: Normal)   Pulse 84   Ht 4' 8.5" (1.435 m)   Wt 151 lb 9.6 oz (68.8 kg)   BMI 33.39 kg/m   Well appearing, pleasant, obese female in no distress Examination of left knee: Tender at medial joint line No effusion or warmth. Some discomfort in joint line area (not over LCL) with valgus stress on the left. Discomfort with [place] but no clicks/clunks Some pain with patellar compression with quad contractio non the left No crepitus. FROM No edema, normal pulses  ASSESSMENT/PLAN:  Left knee pain, unspecified chronicity - suspect OA. check x-ray; supportive measures reviewed.  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   Old films from 02/2002 of the RIGHT knee shows OA changes. Never had x-rays of left.  Discussed weight loss, tylenol arthritis (avoid NSAIDs unless swelling noted), glucosamine/chondroitin. X-ray today  Refer to ortho vs return here for injection if not improving.   Go to [facility] imaging for x-ray of your left knee  ([address]) Start taking Tylenol Arthritis--take as directed on the box, and do not exceed the recommended amount. Try and limit your use of aleve to using it only when seeing significant swelling in the knee.  It may cause more harm to your kidneys and stomach than the tylenol. We also discussed glucosamine and chondroitin supplement (ie osteo biflex), as well as continued use of topical medications such at biofreeze or icy hot. Weight loss and regular exercise will help. Obviously exercise that isn't weight-bearing will be more tolerable.  These include water aerobics or stationery bicycle.

## 2016-07-26 NOTE — Patient Instructions (Addendum)
Go to [facility] imaging for x-ray of your left knee  ([address]) Start taking Tylenol Arthritis--take as directed on the box, and do not exceed the recommended amount. Try and limit your use of aleve to using it only when seeing significant swelling in the knee.  It may cause more harm to your kidneys and stomach than the tylenol. We also discussed glucosamine and chondroitin supplement (ie osteo biflex), as well as continued use of topical medications such at biofreeze or icy hot. Weight loss and regular exercise will help. Obviously exercise that isn't weight-bearing will be more tolerable.  These include water aerobics or stationery bicycle.    Knee Pain, Adult Knee pain in adults is common. It can be caused by many things, including:  Arthritis.  A fluid-filled sac (cyst) or growth in your knee.  An infection in your knee.  An injury that will not heal.  Damage, swelling, or irritation of the tissues that support your knee. Knee pain is usually not a sign of a serious problem. The pain may go away on its own with time and rest. If it does not, a health care provider may order tests to find the cause of the pain. These may include:  Imaging tests, such as an X-ray, MRI, or ultrasound.  Joint aspiration. In this test, fluid is removed from the knee.  Arthroscopy. In this test, a lighted tube is inserted into knee and an image is projected onto a TV screen.  A biopsy. In this test, a sample of tissue is removed from the body and studied under a microscope. Follow these instructions at home: Pay attention to any changes in your symptoms. Take these actions to relieve your pain. Activity   Rest your knee.  Do not do things that cause pain or make pain worse.  Avoid high-impact activities or exercises, such as running, jumping rope, or doing jumping jacks. General instructions   Take over-the-counter and prescription medicines only as told by your health care  provider.  Raise (elevate) your knee above the level of your heart when you are sitting or lying down.  Sleep with a pillow under your knee.  If directed, apply ice to the knee:  Put ice in a plastic bag.  Place a towel between your skin and the bag.  Leave the ice on for 20 minutes, 2-3 times a day.  Ask your health care provider if you should wear an elastic knee support.  Lose weight if you are overweight. Extra weight can put pressure on your knee.  Do not use any products that contain nicotine or tobacco, such as cigarettes and e-cigarettes. Smoking may slow the healing of any bone and joint problems that you may have. If you need help quitting, ask your health care provider. Contact a health care provider if:  Your knee pain continues, changes, or gets worse.  You have a fever along with knee pain.  Your knee buckles or locks up.  Your knee swells, and the swelling becomes worse. Get help right away if:  Your knee feels warm to the touch.  You cannot move your knee.  You have severe pain in your knee.  You have chest pain.  You have trouble breathing. Summary  Knee pain in adults is common. It can be caused by many things, including, arthritis, infection, cysts, or injury.  Knee pain is usually not a sign of a serious problem, but if it does not go away, a health care provider  may perform tests to know the cause of the pain.  Pay attention to any changes in your symptoms. Relieve your pain with rest, medicines, light activity, and use of ice.  Get help if your pain continues or becomes very severe, or if your knee buckles or locks up, or if you have chest pain or trouble breathing. This information is not intended to replace advice given to you by your health care provider. Make sure you discuss any questions you have with your health care provider. Document Released: 03/04/2007 Document Revised: 04/27/2016 Document Reviewed: 04/27/2016 Elsevier Interactive  Patient Education  2017 [employer].

## 2016-07-28 ENCOUNTER — Other Ambulatory Visit: Payer: Self-pay | Admitting: Family Medicine

## 2016-07-28 DIAGNOSIS — E7800 Pure hypercholesterolemia, unspecified: Secondary | ICD-10-CM

## 2016-08-19 DIAGNOSIS — G4733 Obstructive sleep apnea (adult) (pediatric): Secondary | ICD-10-CM | POA: Diagnosis not present

## 2016-09-03 ENCOUNTER — Other Ambulatory Visit: Payer: 59

## 2016-09-03 DIAGNOSIS — Z5181 Encounter for therapeutic drug level monitoring: Secondary | ICD-10-CM

## 2016-09-03 DIAGNOSIS — I10 Essential (primary) hypertension: Secondary | ICD-10-CM

## 2016-09-03 DIAGNOSIS — E119 Type 2 diabetes mellitus without complications: Secondary | ICD-10-CM

## 2016-09-03 LAB — LIPID PANEL
CHOL/HDL RATIO: 2.9 ratio (ref <5.0)
Cholesterol: 126 mg/dL (ref <200)
HDL: 43 mg/dL — ABNORMAL LOW (ref >50)
LDL CALC: 57 mg/dL (ref <100)
Triglycerides: 132 mg/dL (ref <150)
VLDL: 26 mg/dL (ref <30)

## 2016-09-03 LAB — COMPREHENSIVE METABOLIC PANEL
ALT: 24 U/L (ref 6–29)
AST: 24 U/L (ref 10–35)
Albumin: 4 g/dL (ref 3.6–5.1)
Alkaline Phosphatase: 44 U/L (ref 33–130)
BILIRUBIN TOTAL: 0.3 mg/dL (ref 0.2–1.2)
BUN: 12 mg/dL (ref 7–25)
CO2: 26 mmol/L (ref 20–31)
CREATININE: 0.44 mg/dL — AB (ref 0.50–1.05)
Calcium: 9.1 mg/dL (ref 8.6–10.4)
Chloride: 107 mmol/L (ref 98–110)
GLUCOSE: 133 mg/dL — AB (ref 65–99)
Potassium: 4.2 mmol/L (ref 3.5–5.3)
SODIUM: 141 mmol/L (ref 135–146)
Total Protein: 6.3 g/dL (ref 6.1–8.1)

## 2016-09-04 LAB — HEMOGLOBIN A1C
Hgb A1c MFr Bld: 6.1 % — ABNORMAL HIGH (ref <5.7)
Mean Plasma Glucose: 128 mg/dL

## 2016-09-05 NOTE — Progress Notes (Signed)
Chief Complaint  Patient presents with  . Hypertension    nonfasting med check, labs already done. Needs recommendation for her arthritis, her knees are worsening, L knee is worse than R but they are both bothering her.     Patient presents to follow up on Diabetes. She was started back on Metformin in October. Her A1c had jumped to 7.9. She is taking 1069m without any side effects.  She lost her meter so hasn't been checking sugars. She denies polydipsia, polyuria, vision changes, numbness/tingling. Checks feet regularly without concerns.She had labs prior to her visit today.  Lab Results  Component Value Date   HGBA1C 6.1 (H) 09/03/2016   OSA--doing well on CPAP. Compliant with daily use. Feels refreshed in the mornings, notes improvement.  Hypertension follow-up: Hasn't been checking blood pressures at home recently. Denies dizziness, headaches, chest pain. Denies side effects of medications, no cough.   Depression: Moods seem to be well controlled on the current dose of paroxetine. (still has some days feels more down than others, but overall doing okay).  Moods are better since quitting her job.  She had to put one of her three dogs down last month, which was a little rough, doing better now.  She quit her job (was unhappy with her job, related to the evening/weekend hours) 6 weeks ago. She has been enjoying spoiling her husband and dogs.  Hoping to go back to work part-time later in the summer. Denies SI/HI. Denies side effects of medications.   Hyperlipidemia follow-up: Patient is reportedly following a low-fat, low cholesterol diet. Compliant with medications (simvastatin and fish oil) and denies medication side effects.  Lab Results  Component Value Date   CHOL 126 09/03/2016   HDL 43 (L) 09/03/2016   LDLCALC 57 09/03/2016   TRIG 132 09/03/2016   CHOLHDL 2.9 09/03/2016    Osteoporosis: She is compliant with taking fosamax weekly. Denies side effects. Denies chest pain or  dysphagia, occasional heartburn not related to the medication. Last DEXA was 08/2014 which showed T-2.0 at spine, -1.6 at left hip. 10% improvement of L hip bone density, and 11% increase in the spine.  Insomnia: requires Ambien CR nightly. No unusual behavior (keeps phone downstairs, not sending any texts late). It works well for her. She doesn't sleep at all if she doesn't take it, "the brain won't shut up".  She was also found to have C.diff in October, treated with Flagyl. She hasn't had any recurrent diarrhea.  4-5 weeks ago she was constipated for a week, resolved with a laxative.  Having knee pain, L>R.  She would like to see an orthopedist for evaluation. It limits her exercise, pain with walking.   Gets cold sores about twice a year. Needs a refill on the Valtrex.  PMH, PSH, SH reviewed  Outpatient Encounter Prescriptions as of 09/06/2016  Medication Sig Note  . alendronate (FOSAMAX) 70 MG tablet TAKE 1 TABLET EVERY SEVEN DAYS WITH A FULL GLASS OF WATER ON AN EMPTY STOMACH   . Calcium Carbonate-Vitamin D (CALTRATE 600+D) 600-400 MG-UNIT per tablet Take 1 tablet by mouth 2 (two) times daily.    . cholecalciferol (VITAMIN D) 1000 UNITS tablet Take 1,000 Units by mouth daily.   01/28/2014: Taking every other day  . ibuprofen (ADVIL,MOTRIN) 200 MG tablet Take 600 mg by mouth every 8 (eight) hours as needed for pain. Reported on 08/03/2015   . lisinopril (PRINIVIL,ZESTRIL) 10 MG tablet Take 1 tablet (10 mg total) by mouth daily.   .Marland Kitchen  metFORMIN (GLUCOPHAGE-XR) 500 MG 24 hr tablet Start at 1 tablet by mouth with breakfast or dinner once daily. After 1 week, increase to 2 tablets daily   . Multiple Vitamins-Minerals (MULTIVITAMIN WITH MINERALS) tablet Take 1 tablet by mouth daily.     . Omega-3 Fatty Acids (FISH OIL) 1200 MG CAPS Take 1 capsule by mouth daily. 2 nightly   . PARoxetine (PAXIL) 40 MG tablet Take 1 tablet (40 mg total) by mouth every morning.   . RESTASIS 0.05 % ophthalmic  emulsion INSTILL 1 DROP IN [facility] EYE 2 TIMES DAILY 02/03/2015: Received from: External Pharmacy  . simvastatin (ZOCOR) 20 MG tablet TAKE 1 TABLET AT BEDTIME   . vitamin C (ASCORBIC ACID) 500 MG tablet Take 500 mg by mouth daily.     . vitamin E 400 UNIT capsule Take 400 Units by mouth daily.     Marland Kitchen zolpidem (AMBIEN CR) 12.5 MG CR tablet take 1 tablet by mouth at bedtime if needed for sleep   . [DISCONTINUED] metFORMIN (GLUCOPHAGE-XR) 500 MG 24 hr tablet Start at 1 tablet by mouth with breakfast or dinner once daily. After 1 week, increase to 2 tablets daily 06/14/2016: Taking 2 daily  . acetaminophen (TYLENOL) 500 MG tablet Take 1,500 mg by mouth every 6 (six) hours as needed. Reported on 08/03/2015   . Lysine 1000 MG TABS Take 1 tablet by mouth daily as needed. Reported on 08/03/2015 07/04/2011: AS NEEDED  . omeprazole (PRILOSEC OTC) 20 MG tablet Take 1 capsule up to twice daily for the next 1-2 weeks (Patient not taking: Reported on 06/14/2016) 03/15/2016: Uses prn heartburn or hoarseness (every few weeks)  . simethicone (MYLICON) 80 MG chewable tablet Chew 80 mg by mouth as needed.   12/07/2015: prn  . valACYclovir (VALTREX) 1000 MG tablet TAKE 2 TABLETS AT ONSET OF COLD SORE AND REPEAT IN 12 HOURS (4 TABLETS PER EPISODE OF COLD SORE)   . [DISCONTINUED] naproxen sodium (ANAPROX) 220 MG tablet Take 660 mg by mouth 2 (two) times daily with a meal.   . [DISCONTINUED] valACYclovir (VALTREX) 1000 MG tablet TAKE 2 TABLETS AT ONSET OF COLD SORE AND REPEAT IN 12 HOURS (4 TABLETS PER EPISODE OF COLD SORE) (Patient not taking: Reported on 03/15/2016) 12/07/2015: prn   No facility-administered encounter medications on file as of 09/06/2016.    Allergies  Allergen Reactions  . Heparin Other (See Comments)    Low platelets  . Niacin And Related Other (See Comments)    Blotchy/itchy  . Tessalon [Benzonatate] Diarrhea  . Tramadol Other (See Comments)    Dizziness (lasted 24 hours after taking 2 doses)  .  Penicillins Rash   ROS:  No fever, chills, headaches, dizziness, URI symptoms, chest pain, shortness of breath, GI or GU complaints.  +knee pain, no other complaints of pain.  Moods good.  See HPI   PHYSICAL EXAM:  BP 120/72 (BP Location: Left Arm, Patient Position: Sitting, Cuff Size: Normal)   Pulse 76   Ht 4' 8.5" (1.435 m)   Wt 153 lb 9.6 oz (69.7 kg)   BMI 33.83 kg/m    Wt Readings from Last 3 Encounters:  07/26/16 151 lb 9.6 oz (68.8 kg)  06/14/16 151 lb 6.4 oz (68.7 kg)  03/15/16 156 lb 9.6 oz (71 kg)   Well developed, pleasant overweight female in no distress  HEENT: PERRL, conjunctiva and sclera are clear. OP clear Neck: no lymphadenopathy, thyromegaly or bruit  Heart: regular rate and rhythm without murmur  Lungs clear bilaterally, no wheezes, rales, ronchi Abdomen: soft, nontender, no mass, no organomegaly or mass Extremities: no edema, 2+ pulses (knees not examined today) Skin: no rash or lesions Psych: normal mood, affect, hygiene and grooming  Neuro: alert and oriented, normal gait      Chemistry      Component Value Date/Time   NA 141 09/03/2016 0815   K 4.2 09/03/2016 0815   CL 107 09/03/2016 0815   CO2 26 09/03/2016 0815   BUN 12 09/03/2016 0815   CREATININE 0.44 (L) 09/03/2016 0815      Component Value Date/Time   CALCIUM 9.1 09/03/2016 0815   ALKPHOS 44 09/03/2016 0815   AST 24 09/03/2016 0815   ALT 24 09/03/2016 0815   BILITOT 0.3 09/03/2016 0815     Fasting glucose 133 Lab Results  Component Value Date   HGBA1C 6.1 (H) 09/03/2016   Lab Results  Component Value Date   CHOL 126 09/03/2016   HDL 43 (L) 09/03/2016   LDLCALC 57 09/03/2016   TRIG 132 09/03/2016   CHOLHDL 2.9 09/03/2016     ASSESSMENT/PLAN:  Type 2 diabetes mellitus without complication, without long-term current use of insulin (HCC) - controlled; continue metformin. Encouraged daily exercise, proper diet and further weight loss - Plan: metFORMIN (GLUCOPHAGE-XR)  500 MG 24 hr tablet  Essential hypertension, benign - well controlled  Pure hypercholesterolemia - lipids at goal; continue current regimen  Osteoporosis, unspecified osteoporosis type, unspecified pathological fracture presence - continue calcium, D, weight bearing exercise, alendronate. Due for DEXA - Plan: DG Bone Density  OSA on CPAP - compliant; continue  Insomnia, unspecified type - requires daily ambien, working well  Herpes labialis - Plan: valACYclovir (VALTREX) 1000 MG tablet  Pain in both knees, unspecified chronicity - encouraged weight loss; discussed which exercises to avoid. Trial topical meds. f/u with ortho if not improving   f/u 6 months for CPE, labs prior A1c, c-met, microalb/Cr, TSH, D, CBC, lipids   Continue your current medications. Work on getting at least 150 minutes of aerobic exercise each week, trying to get at least 30 minutes daily.  Now that you have some more time, try and work on cooking healthy meals, and work on losing more weight.  Losing the weight should help decrease the knee pain.  Try water aerobics, exercise bike, or walking on flat surfaces, as these will be less painful.  Use Tylenol Arthritis for pain as needed (preferable to advil/ibuprofen as long as there isn't significant joint swelling). Consider a supplement such as osteo biflex (glucosamine/chondroitin). Don't re-try this if it caused you side effects in the past. Topical medications such as biofreeze or icy hot can also be helpful.    You are due for your bone density, and past due for your mammogram. Please schedule both for the same day.

## 2016-09-06 ENCOUNTER — Ambulatory Visit (INDEPENDENT_AMBULATORY_CARE_PROVIDER_SITE_OTHER): Payer: 59 | Admitting: Family Medicine

## 2016-09-06 ENCOUNTER — Encounter: Payer: Self-pay | Admitting: Family Medicine

## 2016-09-06 VITALS — BP 120/72 | HR 76 | Ht <= 58 in | Wt 153.6 lb

## 2016-09-06 DIAGNOSIS — B001 Herpesviral vesicular dermatitis: Secondary | ICD-10-CM

## 2016-09-06 DIAGNOSIS — G4733 Obstructive sleep apnea (adult) (pediatric): Secondary | ICD-10-CM

## 2016-09-06 DIAGNOSIS — E7800 Pure hypercholesterolemia, unspecified: Secondary | ICD-10-CM

## 2016-09-06 DIAGNOSIS — I10 Essential (primary) hypertension: Secondary | ICD-10-CM | POA: Diagnosis not present

## 2016-09-06 DIAGNOSIS — M810 Age-related osteoporosis without current pathological fracture: Secondary | ICD-10-CM

## 2016-09-06 DIAGNOSIS — Z9989 Dependence on other enabling machines and devices: Secondary | ICD-10-CM

## 2016-09-06 DIAGNOSIS — G4700 Insomnia, unspecified: Secondary | ICD-10-CM

## 2016-09-06 DIAGNOSIS — E119 Type 2 diabetes mellitus without complications: Secondary | ICD-10-CM | POA: Diagnosis not present

## 2016-09-06 DIAGNOSIS — Z5181 Encounter for therapeutic drug level monitoring: Secondary | ICD-10-CM

## 2016-09-06 DIAGNOSIS — M25561 Pain in right knee: Secondary | ICD-10-CM

## 2016-09-06 DIAGNOSIS — M25562 Pain in left knee: Secondary | ICD-10-CM

## 2016-09-06 MED ORDER — METFORMIN HCL ER 500 MG PO TB24: ORAL_TABLET | ORAL | 1 refills | Status: DC

## 2016-09-06 MED ORDER — VALACYCLOVIR HCL 1 G PO TABS: ORAL_TABLET | ORAL | 1 refills | Status: DC

## 2016-09-06 NOTE — Patient Instructions (Addendum)
  Continue your current medications. Work on getting at least 150 minutes of aerobic exercise each week, trying to get at least 30 minutes daily.  Now that you have some more time, try and work on cooking healthy meals, and work on losing more weight.  Losing the weight should help decrease the knee pain.  Try water aerobics, exercise bike, or walking on flat surfaces, as these will be less painful.  Use Tylenol Arthritis for pain as needed (preferable to advil/ibuprofen as long as there isn't significant joint swelling). Consider a supplement such as osteo biflex (glucosamine/chondroitin). Don't re-try this if it caused you side effects in the past. Topical medications such as biofreeze or icy hot can also be helpful.   We discussed various orthopedic offices, including Dr. Wynelle Link at [facility] are due for your bone density, and past due for your mammogram. Please schedule both for the same day.  I recommend getting the new shingles vaccine (Shingrix). You will need to check with your insurance to see if it is covered.  It is a series of 2 injections, spaced 2 months apart.

## 2016-09-14 ENCOUNTER — Other Ambulatory Visit: Payer: Self-pay | Admitting: Family Medicine

## 2016-09-14 DIAGNOSIS — E119 Type 2 diabetes mellitus without complications: Secondary | ICD-10-CM

## 2016-09-14 MED ORDER — METFORMIN HCL ER 500 MG PO TB24: ORAL_TABLET | ORAL | 1 refills | Status: DC

## 2016-09-18 DIAGNOSIS — G4733 Obstructive sleep apnea (adult) (pediatric): Secondary | ICD-10-CM | POA: Diagnosis not present

## 2016-09-27 DIAGNOSIS — M1712 Unilateral primary osteoarthritis, left knee: Secondary | ICD-10-CM | POA: Diagnosis not present

## 2016-10-16 ENCOUNTER — Other Ambulatory Visit: Payer: Self-pay | Admitting: Family Medicine

## 2016-10-17 NOTE — Telephone Encounter (Signed)
Is this okay to refill? 

## 2016-10-17 NOTE — Telephone Encounter (Signed)
Okay for refill?  

## 2016-11-15 DIAGNOSIS — M1712 Unilateral primary osteoarthritis, left knee: Secondary | ICD-10-CM | POA: Diagnosis not present

## 2016-12-14 DIAGNOSIS — M1712 Unilateral primary osteoarthritis, left knee: Secondary | ICD-10-CM | POA: Diagnosis not present

## 2016-12-18 ENCOUNTER — Other Ambulatory Visit: Payer: Self-pay | Admitting: Family Medicine

## 2016-12-18 NOTE — Telephone Encounter (Signed)
Written for #90 on 5/30--too soon for refill

## 2016-12-18 NOTE — Telephone Encounter (Signed)
Can this pt have she a refill on this ?

## 2016-12-21 DIAGNOSIS — M1712 Unilateral primary osteoarthritis, left knee: Secondary | ICD-10-CM | POA: Diagnosis not present

## 2016-12-28 DIAGNOSIS — M1712 Unilateral primary osteoarthritis, left knee: Secondary | ICD-10-CM | POA: Diagnosis not present

## 2017-01-17 ENCOUNTER — Other Ambulatory Visit: Payer: Self-pay | Admitting: Family Medicine

## 2017-01-17 NOTE — Telephone Encounter (Signed)
States her local pharmacy will only fill #30 so that is what I will call in.

## 2017-01-17 NOTE — Telephone Encounter (Signed)
Ok for #30 if she is requesting, or okay for #90 as we have done in the past

## 2017-01-17 NOTE — Telephone Encounter (Signed)
Is this okay to refill? 

## 2017-02-08 DIAGNOSIS — M1712 Unilateral primary osteoarthritis, left knee: Secondary | ICD-10-CM | POA: Diagnosis not present

## 2017-02-17 ENCOUNTER — Other Ambulatory Visit: Payer: Self-pay | Admitting: Family Medicine

## 2017-02-18 NOTE — Telephone Encounter (Signed)
Is this okay to refill? 

## 2017-02-18 NOTE — Telephone Encounter (Signed)
She has appt 11/1. Okay to refill #30

## 2017-02-26 ENCOUNTER — Other Ambulatory Visit: Payer: Self-pay | Admitting: Family Medicine

## 2017-03-13 ENCOUNTER — Encounter: Payer: Self-pay | Admitting: Family Medicine

## 2017-03-13 ENCOUNTER — Ambulatory Visit (INDEPENDENT_AMBULATORY_CARE_PROVIDER_SITE_OTHER): Payer: 59 | Admitting: Family Medicine

## 2017-03-13 VITALS — BP 120/70 | HR 68 | Temp 98.2°F | Ht <= 58 in | Wt 156.2 lb

## 2017-03-13 DIAGNOSIS — W57XXXA Bitten or stung by nonvenomous insect and other nonvenomous arthropods, initial encounter: Secondary | ICD-10-CM | POA: Diagnosis not present

## 2017-03-13 DIAGNOSIS — H6121 Impacted cerumen, right ear: Secondary | ICD-10-CM

## 2017-03-13 DIAGNOSIS — L299 Pruritus, unspecified: Secondary | ICD-10-CM

## 2017-03-13 DIAGNOSIS — Z23 Encounter for immunization: Secondary | ICD-10-CM | POA: Diagnosis not present

## 2017-03-13 MED ORDER — HYDROXYZINE HCL 25 MG PO TABS: 25.0000 mg | ORAL_TABLET | Freq: Three times a day (TID) | ORAL | 0 refills | Status: DC | PRN

## 2017-03-13 NOTE — Progress Notes (Signed)
Chief Complaint  Patient presents with  . Ear Pain    right ear pain x 10 days and also numerous bug bites on her legs and arms x 8 days, terribly itchy.    She has pain, plugging and decreased hearing in the right ear x 10 days. Feels like she needs to pop it but can't.  No change over the last 10 days.  Denies URI symptoms or allergy symptoms. She has some discomfort in the right lower jaw.  Denies TMJ pain or popping.   No fever, chills.  She was at the [place] in [facility] [place] and got "no see-um" bites at both ankles, forearms and wrists.  This occurred 9 days ago.  She was in her own camper, no known flea exposure. She reports the camper was surrounded by the bugs and knows exactly when she received the bites. She tried Solarcaine, Benadryl, topical benadryl gel, aspercreme.  None of these have helped. They are extremely itchy, not painful.  Denies fire ants or fleas or other exposures. Others with her did not get bitten.  PMH, PSH, SH reviewed  Outpatient Encounter Prescriptions as of 03/13/2017  Medication Sig Note  . alendronate (FOSAMAX) 70 MG tablet TAKE 1 TABLET EVERY SEVEN DAYS WITH A FULL GLASS OF WATER ON AN EMPTY STOMACH   . Calcium Carbonate-Vitamin D (CALTRATE 600+D) 600-400 MG-UNIT per tablet Take 1 tablet by mouth 2 (two) times daily.    . cholecalciferol (VITAMIN D) 1000 UNITS tablet Take 1,000 Units by mouth daily.   01/28/2014: Taking every other day  . HYDROcodone-acetaminophen (NORCO/VICODIN) 5-325 MG tablet Take 1 tablet by mouth every 6 (six) hours as needed for moderate pain.   Marland Kitchen lisinopril (PRINIVIL,ZESTRIL) 10 MG tablet Take 1 tablet (10 mg total) by mouth daily.   . metFORMIN (GLUCOPHAGE-XR) 500 MG 24 hr tablet Take  2 tablets daily   . Multiple Vitamins-Minerals (MULTIVITAMIN WITH MINERALS) tablet Take 1 tablet by mouth daily.     . Omega-3 Fatty Acids (FISH OIL) 1200 MG CAPS Take 1 capsule by mouth daily. 2 nightly   . omeprazole (PRILOSEC OTC) 20 MG tablet Take 1 capsule up  to twice daily for the next 1-2 weeks 03/15/2016: Uses prn heartburn or hoarseness (every few weeks)  . PARoxetine (PAXIL) 40 MG tablet Take 1 tablet (40 mg total) by mouth every morning.   . RESTASIS 0.05 % ophthalmic emulsion INSTILL 1 DROP IN [facility] EYE 2 TIMES DAILY 02/03/2015: Received from: External Pharmacy  . simvastatin (ZOCOR) 20 MG tablet TAKE 1 TABLET AT BEDTIME   . vitamin C (ASCORBIC ACID) 500 MG tablet Take 500 mg by mouth daily.     . vitamin E 400 UNIT capsule Take 400 Units by mouth daily.     Marland Kitchen zolpidem (AMBIEN CR) 12.5 MG CR tablet TAKE 1 TABLET BY MOUTH EVERY DAY AT BEDTIME   . acetaminophen (TYLENOL) 500 MG tablet Take 1,500 mg by mouth every 6 (six) hours as needed. Reported on 08/03/2015   . ibuprofen (ADVIL,MOTRIN) 200 MG tablet Take 600 mg by mouth every 8 (eight) hours as needed for pain. Reported on 08/03/2015   . Lysine 1000 MG TABS Take 1 tablet by mouth daily as needed. Reported on 08/03/2015 07/04/2011: AS NEEDED  . simethicone (MYLICON) 80 MG chewable tablet Chew 80 mg by mouth as needed.   12/07/2015: prn  . valACYclovir (VALTREX) 1000 MG tablet TAKE 2 TABLETS AT ONSET OF COLD SORE AND REPEAT IN 12 HOURS (4 TABLETS PER  EPISODE OF COLD SORE) (Patient not taking: Reported on 03/13/2017)    No facility-administered encounter medications on file as of 03/13/2017.    Allergies  Allergen Reactions  . Heparin Other (See Comments)    Low platelets  . Niacin And Related Other (See Comments)    Blotchy/itchy  . Tessalon [Benzonatate] Diarrhea  . Tramadol Other (See Comments)    Dizziness (lasted 24 hours after taking 2 doses)  . Penicillins Rash   ROS:  No fever, chills, headaches, dizziness. No body aches, rashes (other than the bites), nausea, vomiting, diarrhea, fever, chills, URI symptoms or other complaints. +decreased hearing and discomfort in right ear per HPI.  PHYSICAL EXAM:  BP 120/70   Pulse 68   Temp 98.2 F (36.8 C) (Oral)   Ht 4' 8.5" (1.435 m)   Wt  156 lb 3.2 oz (70.9 kg)   BMI 34.40 kg/m    Well appearing, pleasant female, frequently scratching at her forearms and legs, otherwise in no distress HEENT: PERRL, EOMI, conjunctiva and sclera are clear.  TM and EAC is normal on the left.  On the right, there is a blue tube which appears to be at an incline/angle, taking up most of the canal, with cerumen on either side. TM is not visualized.  Nasal mucosa is normal, OP is clear, sinuses are nontender.  TMJ nontender After ear soak and lavage, some cerumen was removed.  The tube was still present, clearly at an angle with the opening pointing superiorly.  I was able to get some additional cerumen with a curette, but not able to dislodge or move the tube.  Patient tolerated the procedure well, without pain, and hearing was improved upon removal of the cerumen.  Neck: no lymphadenopathy or mass Heart: regular rate and rhythm Lungs: clear bilaterally Skin: multiple small papules, some of which are slightly excoriated, scattered at L>R forearm and bilateral lower legs.  No erythema, crusting, soft tissue swelling.   ASSESSMENT/PLAN:  Hearing loss due to cerumen impaction, right - blocked by both cerumen and old PE tube, which is at an angle in the canal. hearing improved after cerumen removal; f/u ENT if ongoing pain/dec hearing  Need for influenza vaccination - Plan: Flu Vaccine QUAD 6+ mos PF IM (Fluarix Quad PF)  Insect bite, initial encounter - no evidence of infection.  Trial OTC HC, atarax; call for rx steroid cream if not improving. Supportive measures reviewed  Itching    Use hydrocortisone 1% cream (over-the-counter, such as Cortaid) three times daily as needed for itching, apply to areas of bites. You can also try Aveeno both/soak to the extremities that are affected.  There are some other anti-itch creams that may be effective, such as Sarna (has menthol, which is cooling).    I'm prescribing hydroxyzine, which is an  antihistamine (kind of like benadryl, so don't use both) which should help with the itching. It may make you sleepy, so if using it during the day, do so with caution, don't drive if sleepy.  Hopefully it should help at night, and keep you from scratching in your sleep.  Hold off on taking the Ambien, as taking both may be overly sedating.  If the hydroxyzine doesn't make you sleepy and you didn't sleep without the ambien, then you can use the two together, if needed.  If theses measures aren't helping with the itching, you can contact me for a prescription steroid cream (triamcinolone). Remember that if we prescribe this, you need to apply  this sparingly to the bites, not like a lotion over the whole extremity, to avoid changes to the normal/unaffected areas of skin between bites.

## 2017-03-13 NOTE — Patient Instructions (Addendum)
Use hydrocortisone 1% cream (over-the-counter, such as Cortaid) three times daily as needed for itching, apply to areas of bites. You can also try Aveeno both/soak to the extremities that are affected.  There are some other anti-itch creams that may be effective, such as Sarna (has menthol, which is cooling).    I'm prescribing hydroxyzine, which is an antihistamine (kind of like benadryl, so don't use both) which should help with the itching. It may make you sleepy, so if using it during the day, do so with caution, don't drive if sleepy.  Hopefully it should help at night, and keep you from scratching in your sleep.  Hold off on taking the Ambien, as taking both may be overly sedating.  If the hydroxyzine doesn't make you sleepy and you didn't sleep without the ambien, then you can use the two together, if needed.  If theses measures aren't helping with the itching, you can contact me for a prescription steroid cream (triamcinolone). Remember that if we prescribe this, you need to apply this sparingly to the bites, not like a lotion over the whole extremity, to avoid changes to the normal/unaffected areas of skin between bites.    We removed some wax from your ear, so now the canal isn't completely blocked, and your hearing is a little better.  There still is the blue tube in the canal, that is at somewhat of an angle, and so is blocking the canal more than usual.  If you have any ongoing problems with discomfort or hearing, you will need to see an ENT to have this addressed.    Insect Bite, Adult An insect bite can make your skin red, itchy, and swollen. An insect bite is different from an insect sting, which happens when an insect injects poison (venom) into the skin. Some insects can spread disease to people through a bite. However, most insect bites do not lead to disease and are not serious. What are the causes? Insects may bite for a variety of reasons, including:  Hunger.  To defend  themselves.  Insects that bite include:  Spiders.  Mosquitoes.  Ticks.  Fleas.  Ants.  Flies.  Bedbugs.  What are the signs or symptoms? Symptoms of this condition include:  Itching or pain in the bite area.  Redness and swelling in the bite area.  An open wound (skin ulcer).  In many cases, symptoms last for 2-4 days. How is this diagnosed? This condition is usually diagnosed based on symptoms and a physical exam. How is this treated? Treatment is usually not needed. Symptoms often go away on their own. When treatment is recommended, it may involve:  Applying a cream or lotion to the bitten area. This treatment helps with itching.  Taking an antibiotic medicine. This treatment is needed if the bite area gets infected.  Getting a tetanus shot.  Applying ice to the affected area.  Medicines called antihistamines. This treatment is needed if you develop an allergic reaction to the insect bite.  Follow these instructions at home: Bite area care  Do not scratch the bite area.  Keep the bite area clean and dry. Wash it every day with soap and water as told by your health care provider.  Check the bite area every day for signs of infection. Check for: ? More redness, swelling, or pain. ? Fluid or blood. ? Warmth. ? Pus. Managing pain, itching, and swelling   You may apply a baking soda paste, cortisone cream, or calamine lotion to  the bite area as told by your health care provider.  If directed, applyice to the bite area. ? Put ice in a plastic bag. ? Place a towel between your skin and the bag. ? Leave the ice on for 20 minutes, 2-3 times per day. Medicines  Apply or take over-the-counter and prescription medicines only as told by your health care provider.  If you were prescribed an antibiotic medicine, use it as told by your health care provider. Do not stop using the antibiotic even if your condition improves. General instructions  Keep all  follow-up visits as told by your health care provider. This is important. How is this prevented? To help reduce your risk of insect bites:  When you are outdoors, wear clothing that covers your arms and legs.  Use insect repellent. The best insect repellents contain: ? DEET, picaridin, oil of lemon eucalyptus (OLE), or IR3535. ? Higher amounts of an active ingredient.  If your home windows do not have screens, consider installing them.  Contact a health care provider if:  You have more redness, swelling, or pain in the bite area.  You have fluid, blood, or pus coming from the bite area.  The bite area feels warm to the touch.  You have a fever. Get help right away if:  You have joint pain.  You have a rash.  You have shortness of breath.  You feel unusually tired or sleepy.  You have neck pain.  You have a headache.  You have unusual weakness.  You have chest pain.  You have nausea, vomiting, or pain in the abdomen. This information is not intended to replace advice given to you by your health care provider. Make sure you discuss any questions you have with your health care provider. Document Released: 06/14/2004 Document Revised: 01/04/2016 Document Reviewed: 11/14/2015 Elsevier Interactive Patient Education  [employer].

## 2017-03-16 ENCOUNTER — Other Ambulatory Visit: Payer: Self-pay | Admitting: Family Medicine

## 2017-03-16 DIAGNOSIS — E119 Type 2 diabetes mellitus without complications: Secondary | ICD-10-CM

## 2017-03-17 ENCOUNTER — Ambulatory Visit: Payer: Self-pay | Admitting: Orthopedic Surgery

## 2017-03-18 ENCOUNTER — Other Ambulatory Visit: Payer: 59

## 2017-03-18 DIAGNOSIS — E119 Type 2 diabetes mellitus without complications: Secondary | ICD-10-CM

## 2017-03-18 DIAGNOSIS — M810 Age-related osteoporosis without current pathological fracture: Secondary | ICD-10-CM

## 2017-03-18 DIAGNOSIS — I10 Essential (primary) hypertension: Secondary | ICD-10-CM

## 2017-03-18 DIAGNOSIS — Z5181 Encounter for therapeutic drug level monitoring: Secondary | ICD-10-CM

## 2017-03-18 DIAGNOSIS — E7800 Pure hypercholesterolemia, unspecified: Secondary | ICD-10-CM

## 2017-03-19 DIAGNOSIS — T161XXA Foreign body in right ear, initial encounter: Secondary | ICD-10-CM | POA: Diagnosis not present

## 2017-03-19 DIAGNOSIS — H6981 Other specified disorders of Eustachian tube, right ear: Secondary | ICD-10-CM | POA: Diagnosis not present

## 2017-03-19 DIAGNOSIS — H6501 Acute serous otitis media, right ear: Secondary | ICD-10-CM | POA: Diagnosis not present

## 2017-03-19 DIAGNOSIS — H90A31 Mixed conductive and sensorineural hearing loss, unilateral, right ear with restricted hearing on the contralateral side: Secondary | ICD-10-CM | POA: Insufficient documentation

## 2017-03-19 LAB — CBC WITH DIFFERENTIAL/PLATELET
BASOS ABS: 31 {cells}/uL (ref 0–200)
BASOS PCT: 0.5 %
EOS ABS: 211 {cells}/uL (ref 15–500)
Eosinophils Relative: 3.4 %
HCT: 37.1 % (ref 35.0–45.0)
Hemoglobin: 12.7 g/dL (ref 11.7–15.5)
LYMPHS ABS: 2226 {cells}/uL (ref 850–3900)
MCH: 29.2 pg (ref 27.0–33.0)
MCHC: 34.2 g/dL (ref 32.0–36.0)
MCV: 85.3 fL (ref 80.0–100.0)
MONOS PCT: 8.6 %
MPV: 10.2 fL (ref 7.5–12.5)
Neutro Abs: 3199 cells/uL (ref 1500–7800)
Neutrophils Relative %: 51.6 %
PLATELETS: 252 10*3/uL (ref 140–400)
RBC: 4.35 10*6/uL (ref 3.80–5.10)
RDW: 12.6 % (ref 11.0–15.0)
Total Lymphocyte: 35.9 %
WBC: 6.2 10*3/uL (ref 3.8–10.8)
WBCMIX: 533 {cells}/uL (ref 200–950)

## 2017-03-19 LAB — COMPREHENSIVE METABOLIC PANEL
AG RATIO: 2 (calc) (ref 1.0–2.5)
ALBUMIN MSPROF: 4.5 g/dL (ref 3.6–5.1)
ALT: 31 U/L — AB (ref 6–29)
AST: 25 U/L (ref 10–35)
Alkaline phosphatase (APISO): 51 U/L (ref 33–130)
BILIRUBIN TOTAL: 0.4 mg/dL (ref 0.2–1.2)
BUN: 14 mg/dL (ref 7–25)
CALCIUM: 9.8 mg/dL (ref 8.6–10.4)
CO2: 23 mmol/L (ref 20–32)
Chloride: 100 mmol/L (ref 98–110)
Creat: 0.55 mg/dL (ref 0.50–1.05)
Globulin: 2.2 g/dL (calc) (ref 1.9–3.7)
Glucose, Bld: 180 mg/dL — ABNORMAL HIGH (ref 65–99)
POTASSIUM: 4.7 mmol/L (ref 3.5–5.3)
SODIUM: 136 mmol/L (ref 135–146)
TOTAL PROTEIN: 6.7 g/dL (ref 6.1–8.1)

## 2017-03-19 LAB — MICROALBUMIN / CREATININE URINE RATIO
Creatinine, Urine: 145 mg/dL (ref 20–275)
Microalb Creat Ratio: 22 mcg/mg creat (ref <30)
Microalb, Ur: 3.2 mg/dL

## 2017-03-19 LAB — HEMOGLOBIN A1C
Hgb A1c MFr Bld: 7.5 % of total Hgb — ABNORMAL HIGH (ref <5.7)
Mean Plasma Glucose: 169 (calc)
eAG (mmol/L): 9.3 (calc)

## 2017-03-19 LAB — LIPID PANEL
CHOLESTEROL: 150 mg/dL (ref <200)
HDL: 44 mg/dL — ABNORMAL LOW (ref >50)
LDL Cholesterol (Calc): 79 mg/dL (calc)
Non-HDL Cholesterol (Calc): 106 mg/dL (calc) (ref <130)
Total CHOL/HDL Ratio: 3.4 (calc) (ref <5.0)
Triglycerides: 163 mg/dL — ABNORMAL HIGH (ref <150)

## 2017-03-19 LAB — VITAMIN D 25 HYDROXY (VIT D DEFICIENCY, FRACTURES): Vit D, 25-Hydroxy: 53 ng/mL (ref 30–100)

## 2017-03-19 LAB — TSH: TSH: 1.14 mIU/L (ref 0.40–4.50)

## 2017-03-20 ENCOUNTER — Encounter: Payer: Self-pay | Admitting: Family Medicine

## 2017-03-20 NOTE — Progress Notes (Signed)
Chief Complaint  Patient presents with  . Annual Exam    nonfasting annual with pelvic. Will schedule eye appt. No concerns.     Julie Fischer is a 56 y.o. female who presents for a complete physical.  She has the following concerns:  She is scheduled for left TKR 11/19 with Julie Fischer.  Seen recently with decreased hearing right ear. She saw ENT and had the extruded PE tube removed, with plans to insert another (tomorrow)--as she still had fluid and decreased hearing. She also had itchy insect bites for which she was prescribed hydroxyzine and OTC hydrocortisone was recommended.  The atarax was very helpful, and they are healing. No longer bothersome.  Diabetes: She was started back on Metformin in October 2017 after her A1c had jumped to 7.9. At her last visit 6 months ago, the A1c was down to 6.1%.  She is taking 1000mg  without any side effects. She denies polydipsia, polyuria, vision changes, numbness/tingling. Checks feet regularly without concerns.  Last eye exam was a couple of years ago. She hasn't been checking her sugars. She hasn't been exercising as much, due to her left knee pain. She admits to eating more candy and cookies (just stopped buying the cookies). Eating more casseroles (rice or noodles)  OSA--doing well on CPAP. Compliant with daily use. Feels refreshed in the mornings.  Hypertension follow-up: Hasn't been checking blood pressures at home recently. Denies dizziness, headaches, chest pain. Denies side effects of medications, no cough.   Depression: Moods seem to be well controlled on the current dose of paroxetine. Moods improved quitting her job (was unhappy, related to the evening/weekend hours). One of her dogs was killed on a recent trip 2 weeks ago--very traumatic, she watched it get eaten/killed by a pit bull at a campground.  Cried x 23 hours, but is doing much better now. Denies SI/HI. Denies side effects of medications.   Hyperlipidemia follow-up:  Patient is reportedly following a low-fat, low cholesterol diet. Compliant with medications (simvastatin and fish oil) and denies medication side effects.   Osteoporosis: She is compliant with taking fosamax weekly. Denies side effects. Denies chest pain or dysphagia, occasional heartburn not related to the medication. Last DEXA was 08/2014 which showed T-2.0 at spine, -1.6 at left hip. 10% improvement of L hip bone density, and 11% increase in the spine. She was due to have this repeated in 08/2016 ordered but not done.  Insomnia: requires Ambien CR nightly. No unusual behavior (keeps phone downstairs, not sending any texts late). Sometimes she gets hungry about 15 minutes later and asks her husband for food--sometimes she falls asleep before he gets it. She has asked him to NOT bring her food, since she really doesn't recall eating it, but he still does.  The Ambien is effective in treating the insomnia--she doesn't sleep at all if she doesn't take it, "the brain won't shut up".  H/o breast cancer: She is no longer under the care of oncologist (previously saw Julie Fischer.) Last mammogram was in 01/2015, is aware she is past due. She checks her breasts regularly. She has some intermittent tenderness around her scar (chronic, unchanged).    Immunization History  Administered Date(s) Administered  . Influenza Split 02/07/2009, 04/10/2010, 04/19/2011, 01/28/2012  . Influenza,inj,Quad PF,6+ Mos 01/28/2013, 01/28/2014, 02/03/2015, 02/23/2016, 03/13/2017  . Pneumococcal Polysaccharide-23 06/04/2001, 11/15/2010, 03/15/2016  . Td 06/04/2001  . Tdap 11/15/2010   Last Pap smear: s/p hysterectomy  Last mammogram: 01/2015  Last colonoscopy: 12/2010 normal; past due (  due 12/2015 due to FHx) Last DEXA: 08/2014 T-2.0 at spine, -1.6 at L hip (significantly improved from 2012). Ophtho: usually yearly--past due Dentist: twice yearly  Exercise: household chores only (stairs at home, laundry)  Past  Medical History:  Diagnosis Date  . Allergy    fall seasonal  . Anxiety   . Blood transfusion    Platelet transfusion when on heparin  . Breast cancer ([place]) 2001   R breast(lumpectomy,chemo,radiation,tamoxifen)  . C. difficile diarrhea 02/2016   treated with Flagyl  . Depression   . Diabetes mellitus    resolved after weight loss surgery; recurred 2017  . Hyperlipidemia   . Hypertension    resolved after weight loss surgery; recurred 2013  . Insomnia chronic  . Iron deficiency    h/o  . Microalbuminuria    h/o  . OA (osteoarthritis) of knee   . Osteoporosis   . Plantar fasciitis (07') DrRegal   s/p surgical release 01/2011  . Sleep apnea    resolved after weight loss surgery; recurrent with weight gain; on CPAP  . Vitamin D deficiency     Past Surgical History:  Procedure Laterality Date  . ABDOMINAL HYSTERECTOMY  2000   and RSO(endometriosis)  . APPENDECTOMY    . BREAST LUMPECTOMY Right  11/1999  . COLONOSCOPY  2002, 2012  . cuboid stress fracture Right 12/2002  . FOOT SURGERY Left 2008  . GASTRIC ROUX-EN-Y  (Julie Fischer) 3/04  . left shoulder fracture repair  (Julie Fischer) 12/05  . MYRINGOTOMY     tubes B/L, multiple sets  . plantar fasciitis release Right 01/30/11   Dr. Paulla Fischer  . TONSILLECTOMY AND ADENOIDECTOMY      Social History   Social History  . Marital status: Married    Spouse name: N/A  . Number of children: N/A  . Years of education: N/A   Occupational History  . Not on file.   Social History Main Topics  . Smoking status: Former Smoker    Quit date: 01/28/1996  . Smokeless tobacco: Never Used  . Alcohol use Yes     Comment: 1/2 beer every 1-2 months  . Drug use: No  . Sexual activity: Not Currently    Partners: Male   Other Topics Concern  . 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   Lives with husband, 2 dogs    Family  History  Problem Relation Age of Onset  . Dementia Mother   . Stroke Mother 51       due to aneurysm  . Macular degeneration Mother   . Cancer Mother        colon cancer  . Colon cancer Mother 69  . Pancreatic cancer Father   . Cancer Father   . Hyperlipidemia Sister   . Hyperlipidemia Brother   . Diabetes Brother   . Hypertension Brother   . Hepatitis C Brother   . Alcohol abuse Brother   . Drug abuse Brother   . Diabetes Maternal Grandmother   . Hyperlipidemia Sister   . Breast cancer Cousin   . Breast cancer Cousin     Outpatient Encounter Prescriptions as of 03/21/2017  Medication Sig Note  . alendronate (FOSAMAX) 70 MG tablet TAKE 1 TABLET EVERY SEVEN DAYS WITH A FULL GLASS OF WATER ON AN EMPTY STOMACH   . Calcium Carbonate-Vitamin D (CALTRATE 600+D) 600-400 MG-UNIT per tablet Take  1 tablet by mouth 2 (two) times daily.    . cholecalciferol (VITAMIN D) 1000 UNITS tablet Take 1,000 Units by mouth daily.   01/28/2014: Taking every other day  . lisinopril (PRINIVIL,ZESTRIL) 10 MG tablet Take 1 tablet (10 mg total) by mouth daily.   Marland Kitchen Lysine 1000 MG TABS Take 1 tablet by mouth daily as needed. Reported on 08/03/2015 07/04/2011: AS NEEDED  . metFORMIN (GLUCOPHAGE-XR) 500 MG 24 hr tablet Take  2 tablets daily   . Multiple Vitamins-Minerals (MULTIVITAMIN WITH MINERALS) tablet Take 1 tablet by mouth daily.     . Omega-3 Fatty Acids (FISH OIL) 1200 MG CAPS Take 1 capsule by mouth daily. 2 nightly   . omeprazole (PRILOSEC OTC) 20 MG tablet Take 1 capsule up to twice daily for the next 1-2 weeks 03/15/2016: Uses prn heartburn or hoarseness (every few weeks)  . PARoxetine (PAXIL) 40 MG tablet Take 1 tablet (40 mg total) by mouth every morning.   . RESTASIS 0.05 % ophthalmic emulsion INSTILL 1 DROP IN [facility] EYE 2 TIMES DAILY 02/03/2015: Received from: External Pharmacy  . simvastatin (ZOCOR) 20 MG tablet Take 1 tablet (20 mg total) by mouth at bedtime.   . vitamin C (ASCORBIC ACID) 500 MG  tablet Take 500 mg by mouth daily.     . vitamin E 400 UNIT capsule Take 400 Units by mouth daily.     Marland Kitchen zolpidem (AMBIEN CR) 12.5 MG CR tablet Take 1 tablet (12.5 mg total) by mouth at bedtime.   . [DISCONTINUED] lisinopril (PRINIVIL,ZESTRIL) 10 MG tablet Take 1 tablet (10 mg total) by mouth daily.   . [DISCONTINUED] metFORMIN (GLUCOPHAGE-XR) 500 MG 24 hr tablet Take  2 tablets daily   . [DISCONTINUED] PARoxetine (PAXIL) 40 MG tablet Take 1 tablet (40 mg total) by mouth every morning.   . [DISCONTINUED] simvastatin (ZOCOR) 20 MG tablet TAKE 1 TABLET AT BEDTIME   . [DISCONTINUED] zolpidem (AMBIEN CR) 12.5 MG CR tablet TAKE 1 TABLET BY MOUTH EVERY DAY AT BEDTIME   . acetaminophen (TYLENOL) 500 MG tablet Take 1,500 mg by mouth every 6 (six) hours as needed. Reported on 08/03/2015   . HYDROcodone-acetaminophen (NORCO/VICODIN) 5-325 MG tablet Take 1 tablet by mouth every 6 (six) hours as needed for moderate pain.   . hydrOXYzine (ATARAX/VISTARIL) 25 MG tablet Take 1 tablet (25 mg total) by mouth 3 (three) times daily as needed for itching. (Patient not taking: Reported on 03/21/2017)   . ibuprofen (ADVIL,MOTRIN) 200 MG tablet Take 600 mg by mouth every 8 (eight) hours as needed for pain. Reported on 08/03/2015   . simethicone (MYLICON) 80 MG chewable tablet Chew 80 mg by mouth as needed.   12/07/2015: prn  . valACYclovir (VALTREX) 1000 MG tablet TAKE 2 TABLETS AT ONSET OF COLD SORE AND REPEAT IN 12 HOURS (4 TABLETS PER EPISODE OF COLD SORE) (Patient not taking: Reported on 03/13/2017)    No facility-administered encounter medications on file as of 03/21/2017.     Allergies  Allergen Reactions  . Heparin Other (See Comments)    Low platelets  . Niacin And Related Other (See Comments)    Blotchy/itchy  . Tessalon [Benzonatate] Diarrhea  . Tramadol Other (See Comments)    Dizziness (lasted 24 hours after taking 2 doses)  . Penicillins Rash    ROS: The patient denies anorexia, fever, headaches,  vision changes, decreased hearing, ear pain, sore throat, chest pain, palpitations, dizziness, syncope, dyspnea on exertion, cough, swelling, nausea, vomiting, constipation, abdominal pain,  melena, hematochezia, hematuria, incontinence, dysuria, vaginal bleeding, discharge, odor or itch, genital lesions, numbness, tingling, weakness, tremor, suspicious skin lesions, abnormal bleeding/bruising, or enlarged lymph nodes.  Chronic insomnia. Moods well controlled as per HPI. L>R knee pain. Gradual weight gain Some postnasal drainage with sore throat at night--intermittently Decreased hearing right ear; the ache in the ear has resolved. Insect bites are resolving, no longer itchy. Some loose stools (?from metformin)    PHYSICAL EXAM:  BP 128/76   Pulse 72   Ht 4' 8.5" (1.435 m)   Wt 153 lb 6.4 oz (69.6 kg)   BMI 33.79 kg/m   Wt Readings from Last 3 Encounters:  03/21/17 153 lb 6.4 oz (69.6 kg)  03/13/17 156 lb 3.2 oz (70.9 kg)  09/06/16 153 lb 9.6 oz (69.7 kg)    General Appearance:  Alert, cooperative, no distress, appears stated age   Head:  Normocephalic, without obvious abnormality, atraumatic   Eyes:  PERRL, conjunctiva/corneas clear, EOM's intact, fundi benign   Ears:  Normal TM's and external ear canals. Right TM now visualized (last week wasn't, due to cerumen and extruded PE tube), slight scar and some fluid  Nose:  Nasal mucosa mildly edematous with some clear mucus. Sinuses nontender   Throat:  Lips, mucosa, and tongue normal; teeth and gums normal   Neck:  Supple, no lymphadenopathy; thyroid: no enlargement/tenderness/nodules; no carotid bruit or JVD   Back:  Spine nontender, no curvature, ROM normal. No CVA tenderness  Lungs:  Clear to auscultation bilaterally without wheezes, rales or ronchi; respirations unlabored   Chest Wall:  No tenderness or deformity   Heart:  Regular rate and rhythm, S1 and S2 normal, no murmur, rub or gallop   Breast Exam:   R breast--s/p surgery with scarring and some skin changes related to previous radiation (telangiectasias). No focal mass. No nipple discharge or inversion. L breast exam is entirely normal. No axillary lymphadenopathy.  Abdomen:  Soft, nondistended, normoactive bowel sounds, no masses, no hepatosplenomegaly   Genitalia:  Normal external genitalia without lesions. BUS and vagina normal; Bimanual exam revealed surgically absent uterus. No adnexal masses. No mass, rebound tenderness or guarding   Rectal:  Normal tone, no masses or tenderness; guaiac negative stool   Extremities:  No clubbing, cyanosis or edema. No lesions. Normal diabetic foot exam.  Pulses:  2+ and symmetric all extremities   Skin:  Skin color, texture, turgor normal, no rashes or lesions   Lymph nodes:  Cervical, supraclavicular, and axillary nodes normal   Neurologic:  CNII-XII intact, normal strength, sensation and gait; reflexes 2+ and symmetric throughout    Psych:  She was reportedly very tearful with the nurse, recounting the story of her dog's recent death.  She exhibited normal mood and full range affect with me; normal hygiene and grooming  Urine dip notable for glucose Fingerstick glucose (nonfasting) 182 Normal diabetic foot exam   Lab Results  Component Value Date   HGBA1C 7.5 (H) 03/18/2017     Chemistry      Component Value Date/Time   NA 136 03/18/2017 1006   K 4.7 03/18/2017 1006   CL 100 03/18/2017 1006   CO2 23 03/18/2017 1006   BUN 14 03/18/2017 1006   CREATININE 0.55 03/18/2017 1006      Component Value Date/Time   CALCIUM 9.8 03/18/2017 1006   ALKPHOS 44 09/03/2016 0815   AST 25 03/18/2017 1006   ALT 31 (H) 03/18/2017 1006   BILITOT 0.4 03/18/2017 1006  Fasting glucose 180  Urine microalb/Cr 22  Lab Results  Component Value Date   TSH 1.14 03/18/2017   Lab Results  Component Value Date   WBC 6.2 03/18/2017   HGB 12.7 03/18/2017   HCT 37.1 03/18/2017    MCV 85.3 03/18/2017   PLT 252 03/18/2017   Lab Results  Component Value Date   CHOL 150 03/18/2017   HDL 44 (L) 03/18/2017   LDLCALC 57 09/03/2016   TRIG 163 (H) 03/18/2017   CHOLHDL 3.4 03/18/2017   Vitamin D-OH 53   ASSESSMENT/PLAN:  Annual physical exam - Plan: POCT Urinalysis DIP (Proadvantage Device)  Type 2 diabetes mellitus without complication, without long-term current use of insulin (HCC) - worsening control--reinforced diet, monitoring sugars. No change in meds for now; f/u 3 mos - Plan: metFORMIN (GLUCOPHAGE-XR) 500 MG 24 hr tablet, Glucose (CBG)  OSA on CPAP  Osteoporosis, unspecified osteoporosis type, unspecified pathological fracture presence - compliant with alendronate; recheck DEXA  Pure hypercholesterolemia - LDL at goal, TG elevated--reviewed diet. Continue current meds - Plan: simvastatin (ZOCOR) 20 MG tablet  Essential hypertension, benign - controlled - Plan: lisinopril (PRINIVIL,ZESTRIL) 10 MG tablet  Insomnia, unspecified type - controlled with ambien CR. Needs to eliminate late night eating - Plan: zolpidem (AMBIEN CR) 12.5 MG CR tablet  Depression, major, in remission ([place]) - Plan: PARoxetine (PAXIL) 40 MG tablet  Family history of colon cancer requiring screening colonoscopy - Plan: Ambulatory referral to Gastroenterology   Past due for mammo in pt with h/o breast cancer Past due for DEXA, in pt with osteoporosis, on treatment with alendronate Past due for colonoscopy (48yr f/u was due 12/2015, due to +FH)  rec Shingrix  DM not as well controlled, significantly different than on last check--due to dietary changes and limited exercise. Will start with dietary improvement and monitoring blood sugars (so aware of the consequences and makes better choices). Would like to see sugars improve prior to surgery to ensure a better outcome--she understands the reasoning for this, and will be monitoring and improve her diet. She is past due for eye exam  and plans to schedule. Consider starting Farxiga or Jardiance if sugars remain elevated (fasting glucose >150. Recheck A1c at f/u visit in 3 mos.  Discussed monthly self breast exams and yearly mammograms (past due); at least 30 minutes of aerobic activity at least 5 days/week, weight-bearing exercise at least 2x/wk; proper sunscreen use reviewed; healthy diet, including goals of calcium and vitamin D intake and alcohol recommendations (less than or equal to 1 drink/day) reviewed; regular seatbelt use; changing batteries in smoke detectors. Immunization recommendations discussed--UTD; Shingrix recommended. Colonoscopy recommendations reviewed, past due (was due 12/2015, due to family h/o colon cancer in mother). Referred back to Dr. Ardis Hughs.    F/u 3 months (A1c at visit).

## 2017-03-21 ENCOUNTER — Encounter: Payer: Self-pay | Admitting: Family Medicine

## 2017-03-21 ENCOUNTER — Ambulatory Visit (INDEPENDENT_AMBULATORY_CARE_PROVIDER_SITE_OTHER): Payer: 59 | Admitting: Family Medicine

## 2017-03-21 VITALS — BP 128/76 | HR 72 | Ht <= 58 in | Wt 153.4 lb

## 2017-03-21 DIAGNOSIS — E7800 Pure hypercholesterolemia, unspecified: Secondary | ICD-10-CM | POA: Diagnosis not present

## 2017-03-21 DIAGNOSIS — E119 Type 2 diabetes mellitus without complications: Secondary | ICD-10-CM | POA: Diagnosis not present

## 2017-03-21 DIAGNOSIS — G4700 Insomnia, unspecified: Secondary | ICD-10-CM

## 2017-03-21 DIAGNOSIS — I10 Essential (primary) hypertension: Secondary | ICD-10-CM

## 2017-03-21 DIAGNOSIS — Z0000 Encounter for general adult medical examination without abnormal findings: Secondary | ICD-10-CM

## 2017-03-21 DIAGNOSIS — Z800 Family history of malignant neoplasm of digestive organs: Secondary | ICD-10-CM

## 2017-03-21 DIAGNOSIS — M810 Age-related osteoporosis without current pathological fracture: Secondary | ICD-10-CM | POA: Diagnosis not present

## 2017-03-21 DIAGNOSIS — G4733 Obstructive sleep apnea (adult) (pediatric): Secondary | ICD-10-CM | POA: Diagnosis not present

## 2017-03-21 DIAGNOSIS — F325 Major depressive disorder, single episode, in full remission: Secondary | ICD-10-CM

## 2017-03-21 DIAGNOSIS — Z9989 Dependence on other enabling machines and devices: Secondary | ICD-10-CM

## 2017-03-21 LAB — POCT URINALYSIS DIP (PROADVANTAGE DEVICE)
Bilirubin, UA: NEGATIVE
Blood, UA: NEGATIVE
Glucose, UA: 500 mg/dL — AB
Ketones, POC UA: NEGATIVE mg/dL
LEUKOCYTES UA: NEGATIVE
NITRITE UA: NEGATIVE
PH UA: 6 (ref 5.0–8.0)
PROTEIN UA: NEGATIVE mg/dL
Specific Gravity, Urine: 1.02
UUROB: NEGATIVE

## 2017-03-21 LAB — GLUCOSE, POCT (MANUAL RESULT ENTRY): POC GLUCOSE: 182 mg/dL — AB (ref 70–99)

## 2017-03-21 MED ORDER — ZOLPIDEM TARTRATE ER 12.5 MG PO TBCR: 12.5000 mg | EXTENDED_RELEASE_TABLET | Freq: Every day | ORAL | 2 refills | Status: DC

## 2017-03-21 MED ORDER — SIMVASTATIN 20 MG PO TABS: 20.0000 mg | ORAL_TABLET | Freq: Every day | ORAL | 1 refills | Status: DC

## 2017-03-21 MED ORDER — METFORMIN HCL ER 500 MG PO TB24: ORAL_TABLET | ORAL | 1 refills | Status: DC

## 2017-03-21 MED ORDER — LISINOPRIL 10 MG PO TABS: 10.0000 mg | ORAL_TABLET | Freq: Every day | ORAL | 3 refills | Status: DC

## 2017-03-21 MED ORDER — PAROXETINE HCL 40 MG PO TABS: 40.0000 mg | ORAL_TABLET | Freq: Every morning | ORAL | 3 refills | Status: DC

## 2017-03-21 NOTE — Patient Instructions (Addendum)
HEALTH MAINTENANCE RECOMMENDATIONS:  It is recommended that you get at least 30 minutes of aerobic exercise at least 5 days/week (for weight loss, you may need as much as 60-90 minutes). This can be any activity that gets your heart rate up. This can be divided in 10-15 minute intervals if needed, but try and build up your endurance at least once a week.  Weight bearing exercise is also recommended twice weekly.  Eat a healthy diet with lots of vegetables, fruits and fiber.  "Colorful" foods have a lot of vitamins (ie green vegetables, tomatoes, red peppers, etc).  Limit sweet tea, regular sodas and alcoholic beverages, all of which has a lot of calories and sugar.  Up to 1 alcoholic drink daily may be beneficial for women (unless trying to lose weight, watch sugars).  Drink a lot of water.  Calcium recommendations are 1200-1500 mg daily (1500 mg for postmenopausal women or women without ovaries), and vitamin D 1000 IU daily.  This should be obtained from diet and/or supplements (vitamins), and calcium should not be taken all at once, but in divided doses.  Monthly self breast exams and yearly mammograms for women over the age of 45 is recommended.  Sunscreen of at least SPF 30 should be used on all sun-exposed parts of the skin when outside between the hours of 10 am and 4 pm (not just when at beach or pool, but even with exercise, golf, tennis, and yard work!)  Use a sunscreen that says "broad spectrum" so it covers both UVA and UVB rays, and make sure to reapply every 1-2 hours.  Remember to change the batteries in your smoke detectors when changing your clock times in the spring and fall.  Use your seat belt every time you are in a car, and please drive safely and not be distracted with cell phones and texting while driving.  I recommend getting the new shingles vaccine (Shingrix). You will need to check with your insurance to see if it is covered, and if covered by Medicare Part D, you need to  get from the pharmacy rather than our office.  It is a series of 2 injections, spaced 2 months apart.  [facility] and schedule both your mammogram and your bone density (order from 08/2016 should still be good, not expired).  You are past due for colonoscopy--we will be referring you back to Dr. Edison Nasuti to arrange for this.  Your diabetes is not as well controlled as it has been. Please start checking your sugars daily, so that you are aware of the higher blood sugars (it was 180 on your recent fasting labs), and aware of the consequences of the sweets/candy on your blood sugars. It is important to get the sugar down before your surgery.  If your sugars remain >150 fasting, we should consider adding a medication to help get better control. We discussed Jardiance and Wilder Glade today as possible medication to start (that may also help some with weight loss).      Food Choices for Gastroesophageal Reflux Disease, Adult When you have gastroesophageal reflux disease (GERD), the foods you eat and your eating habits are very important. Choosing the right foods can help ease the discomfort of GERD. Consider working with a diet and nutrition specialist (dietitian) to help you make healthy food choices. What general guidelines should I follow? Eating plan  Choose healthy foods low in fat, such as fruits, vegetables, whole grains, low-fat dairy products, and lean meat, fish,  and poultry.  Eat frequent, small meals instead of three large meals each day. Eat your meals slowly, in a relaxed setting. Avoid bending over or lying down until 2-3 hours after eating.  Limit high-fat foods such as fatty meats or fried foods.  Limit your intake of oils, butter, and shortening to less than 8 teaspoons each day.  Avoid the following: ? Foods that cause symptoms. These may be different for different people. Keep a food diary to keep track of foods that cause symptoms. ? Alcohol. ? Drinking large  amounts of liquid with meals. ? Eating meals during the 2-3 hours before bed.  Cook foods using methods other than frying. This may include baking, grilling, or broiling. Lifestyle   Maintain a healthy weight. Ask your health care provider what weight is healthy for you. If you need to lose weight, work with your health care provider to do so safely.  Exercise for at least 30 minutes on 5 or more days each week, or as told by your health care provider.  Avoid wearing clothes that fit tightly around your waist and chest.  Do not use any products that contain nicotine or tobacco, such as cigarettes and e-cigarettes. If you need help quitting, ask your health care provider.  Sleep with the head of your bed raised. Use a wedge under the mattress or blocks under the bed frame to raise the head of the bed. What foods are not recommended? The items listed may not be a complete list. Talk with your dietitian about what dietary choices are best for you. Grains Pastries or quick breads with added fat. [place] toast. Vegetables Deep fried vegetables. [place] fries. Any vegetables prepared with added fat. Any vegetables that cause symptoms. For some people this may include tomatoes and tomato products, chili peppers, onions and garlic, and horseradish. Fruits Any fruits prepared with added fat. Any fruits that cause symptoms. For some people this may include citrus fruits, such as oranges, grapefruit, pineapple, and lemons. Meats and other protein foods High-fat meats, such as fatty beef or pork, hot dogs, ribs, ham, sausage, salami and bacon. Fried meat or protein, including fried fish and fried chicken. Nuts and nut butters. Dairy Whole milk and chocolate milk. Sour cream. Cream. Ice cream. Cream cheese. Milk shakes. Beverages Coffee and tea, with or without caffeine. Carbonated beverages. Sodas. Energy drinks. Fruit juice made with acidic fruits (such as orange or grapefruit). Tomato juice.  Alcoholic drinks. Fats and oils Butter. Margarine. Shortening. Ghee. Sweets and desserts Chocolate and cocoa. Donuts. Seasoning and other foods Pepper. Peppermint and spearmint. Any condiments, herbs, or seasonings that cause symptoms. For some people, this may include curry, hot sauce, or vinegar-based salad dressings. Summary  When you have gastroesophageal reflux disease (GERD), food and lifestyle choices are very important to help ease the discomfort of GERD.  Eat frequent, small meals instead of three large meals each day. Eat your meals slowly, in a relaxed setting. Avoid bending over or lying down until 2-3 hours after eating.  Limit high-fat foods such as fatty meat or fried foods. This information is not intended to replace advice given to you by your health care provider. Make sure you discuss any questions you have with your health care provider. Document Released: 05/07/2005 Document Revised: 05/08/2016 Document Reviewed: 05/08/2016 Elsevier Interactive Patient Education  2017 [employer].

## 2017-03-22 DIAGNOSIS — H6981 Other specified disorders of Eustachian tube, right ear: Secondary | ICD-10-CM | POA: Diagnosis not present

## 2017-03-28 ENCOUNTER — Encounter: Payer: Self-pay | Admitting: Internal Medicine

## 2017-04-02 NOTE — Progress Notes (Signed)
lov note 03/21/17 epic  Labs 03/18/17 epic cbc/diff, cmp hgba1c epic

## 2017-04-02 NOTE — Patient Instructions (Signed)
Julie Fischer  04/02/2017   Your procedure is scheduled on: 04-08-17  Report to [facility] Main  Entrance   Follow signs to Short Stay on first floor at     0515AM  Call this number if you have problems the morning of surgery  [phone]   Remember: ONLY 1 PERSON MAY GO WITH YOU TO SHORT STAY TO GET  READY MORNING OF YOUR SURGERY.  Do not eat food or drink liquids :After Midnight.     Take these medicines the morning of surgery with A SIP OF WATER: eye drops as usual, paxil                                You may not have any metal on your body including hair pins and              piercings  Do not wear jewelry, make-up, lotions, powders or perfumes, deodorant             Do not wear nail polish.  Do not shave  48 hours prior to surgery.                Do not bring valuables to the hospital. [place].  Contacts, dentures or bridgework may not be worn into surgery.  Leave suitcase in the car. After surgery it may be brought to your room.                  Please read over the following fact sheets you were given: _____________________________________________________________________           [facility] - Preparing for Surgery Before surgery, you can play an important role.  Because skin is not sterile, your skin needs to be as free of germs as possible.  You can reduce the number of germs on your skin by washing with CHG (chlorahexidine gluconate) soap before surgery.  CHG is an antiseptic cleaner which kills germs and bonds with the skin to continue killing germs even after washing. Please DO NOT use if you have an allergy to CHG or antibacterial soaps.  If your skin becomes reddened/irritated stop using the CHG and inform your nurse when you arrive at Short Stay. Do not shave (including legs and underarms) for at least 48 hours prior to the first CHG shower.  You may shave your face/neck. Please  follow these instructions carefully:  1.  Shower with CHG Soap the night before surgery and the  morning of Surgery.  2.  If you choose to wash your hair, wash your hair first as usual with your  normal  shampoo.  3.  After you shampoo, rinse your hair and body thoroughly to remove the  shampoo.                           4.  Use CHG as you would any other liquid soap.  You can apply chg directly  to the skin and wash                       Gently with a scrungie or clean washcloth.  5.  Apply the CHG Soap to  your body ONLY FROM THE NECK DOWN.   Do not use on face/ open                           Wound or open sores. Avoid contact with eyes, ears mouth and genitals (private parts).                       Wash face,  Genitals (private parts) with your normal soap.             6.  Wash thoroughly, paying special attention to the area where your surgery  will be performed.  7.  Thoroughly rinse your body with warm water from the neck down.  8.  DO NOT shower/wash with your normal soap after using and rinsing off  the CHG Soap.                9.  Pat yourself dry with a clean towel.            10.  Wear clean pajamas.            11.  Place clean sheets on your bed the night of your first shower and do not  sleep with pets. Day of Surgery : Do not apply any lotions/deodorants the morning of surgery.  Please wear clean clothes to the hospital/surgery center.  FAILURE TO FOLLOW THESE INSTRUCTIONS MAY RESULT IN THE CANCELLATION OF YOUR SURGERY PATIENT SIGNATURE_________________________________  NURSE SIGNATURE__________________________________  ________________________________________________________________________  WHAT IS A BLOOD TRANSFUSION? Blood Transfusion Information  A transfusion is the replacement of blood or some of its parts. Blood is made up of multiple cells which provide different functions.  Red blood cells carry oxygen and are used for blood loss replacement.  White blood cells  fight against infection.  Platelets control bleeding.  Plasma helps clot blood.  Other blood products are available for specialized needs, such as hemophilia or other clotting disorders. BEFORE THE TRANSFUSION  Who gives blood for transfusions?   Healthy volunteers who are fully evaluated to make sure their blood is safe. This is blood bank blood. Transfusion therapy is the safest it has ever been in the practice of medicine. Before blood is taken from a donor, a complete history is taken to make sure that person has no history of diseases nor engages in risky social behavior (examples are intravenous drug use or sexual activity with multiple partners). The donor's travel history is screened to minimize risk of transmitting infections, such as malaria. The donated blood is tested for signs of infectious diseases, such as HIV and hepatitis. The blood is then tested to be sure it is compatible with you in order to minimize the chance of a transfusion reaction. If you or a relative donates blood, this is often done in anticipation of surgery and is not appropriate for emergency situations. It takes many days to process the donated blood. RISKS AND COMPLICATIONS Although transfusion therapy is very safe and saves many lives, the main dangers of transfusion include:   Getting an infectious disease.  Developing a transfusion reaction. This is an allergic reaction to something in the blood you were given. Every precaution is taken to prevent this. The decision to have a blood transfusion has been considered carefully by your caregiver before blood is given. Blood is not given unless the benefits outweigh the risks. AFTER THE TRANSFUSION  Right after receiving a blood transfusion, you will usually  feel much better and more energetic. This is especially true if your red blood cells have gotten low (anemic). The transfusion raises the level of the red blood cells which carry oxygen, and this usually  causes an energy increase.  The nurse administering the transfusion will monitor you carefully for complications. HOME CARE INSTRUCTIONS  No special instructions are needed after a transfusion. You may find your energy is better. Speak with your caregiver about any limitations on activity for underlying diseases you may have. SEEK MEDICAL CARE IF:   Your condition is not improving after your transfusion.  You develop redness or irritation at the intravenous (IV) site. SEEK IMMEDIATE MEDICAL CARE IF:  Any of the following symptoms occur over the next 12 hours:  Shaking chills.  You have a temperature by mouth above 102 F (38.9 C), not controlled by medicine.  Chest, back, or muscle pain.  People around you feel you are not acting correctly or are confused.  Shortness of breath or difficulty breathing.  Dizziness and fainting.  You get a rash or develop hives.  You have a decrease in urine output.  Your urine turns a dark color or changes to pink, red, or brown. Any of the following symptoms occur over the next 10 days:  You have a temperature by mouth above 102 F (38.9 C), not controlled by medicine.  Shortness of breath.  Weakness after normal activity.  The white part of the eye turns yellow (jaundice).  You have a decrease in the amount of urine or are urinating less often.  Your urine turns a dark color or changes to pink, red, or brown. Document Released: 05/04/2000 Document Revised: 07/30/2011 Document Reviewed: 12/22/2007 ExitCare Patient Information 2014 [place].  _______________________________________________________________________  Incentive Spirometer  An incentive spirometer is a tool that can help keep your lungs clear and active. This tool measures how well you are filling your lungs with each breath. Taking long deep breaths may help reverse or decrease the chance of developing breathing (pulmonary) problems (especially infection)  following:  A long period of time when you are unable to move or be active. BEFORE THE PROCEDURE   If the spirometer includes an indicator to show your best effort, your nurse or respiratory therapist will set it to a desired goal.  If possible, sit up straight or lean slightly forward. Try not to slouch.  Hold the incentive spirometer in an upright position. INSTRUCTIONS FOR USE  1. Sit on the edge of your bed if possible, or sit up as far as you can in bed or on a chair. 2. Hold the incentive spirometer in an upright position. 3. Breathe out normally. 4. Place the mouthpiece in your mouth and seal your lips tightly around it. 5. Breathe in slowly and as deeply as possible, raising the piston or the ball toward the top of the column. 6. Hold your breath for 3-5 seconds or for as long as possible. Allow the piston or ball to fall to the bottom of the column. 7. Remove the mouthpiece from your mouth and breathe out normally. 8. Rest for a few seconds and repeat Steps 1 through 7 at least 10 times every 1-2 hours when you are awake. Take your time and take a few normal breaths between deep breaths. 9. The spirometer may include an indicator to show your best effort. Use the indicator as a goal to work toward during each repetition. 10. After each set of 10 deep breaths, practice coughing to  be sure your lungs are clear. If you have an incision (the cut made at the time of surgery), support your incision when coughing by placing a pillow or rolled up towels firmly against it. Once you are able to get out of bed, walk around indoors and cough well. You may stop using the incentive spirometer when instructed by your caregiver.  RISKS AND COMPLICATIONS  Take your time so you do not get dizzy or light-headed.  If you are in pain, you may need to take or ask for pain medication before doing incentive spirometry. It is harder to take a deep breath if you are having pain. AFTER USE  Rest and  breathe slowly and easily.  It can be helpful to keep track of a log of your progress. Your caregiver can provide you with a simple table to help with this. If you are using the spirometer at home, follow these instructions: [place] IF:   You are having difficultly using the spirometer.  You have trouble using the spirometer as often as instructed.  Your pain medication is not giving enough relief while using the spirometer.  You develop fever of 100.5 F (38.1 C) or higher. SEEK IMMEDIATE MEDICAL CARE IF:   You cough up bloody sputum that had not been present before.  You develop fever of 102 F (38.9 C) or greater.  You develop worsening pain at or near the incision site. MAKE SURE YOU:   Understand these instructions.  Will watch your condition.  Will get help right away if you are not doing well or get worse. Document Released: 09/17/2006 Document Revised: 07/30/2011 Document Reviewed: 11/18/2006 [facility] Patient Information 2014 [place], [place].   ________________________________________________________________________

## 2017-04-03 ENCOUNTER — Encounter (HOSPITAL_COMMUNITY): Payer: Self-pay

## 2017-04-03 ENCOUNTER — Other Ambulatory Visit: Payer: Self-pay

## 2017-04-03 ENCOUNTER — Encounter (HOSPITAL_COMMUNITY)
Admission: RE | Admit: 2017-04-03 | Discharge: 2017-04-03 | Disposition: A | Discharge status: Home / Self Care | Payer: 59 | Source: Ambulatory Visit | Attending: Orthopedic Surgery | Admitting: Orthopedic Surgery

## 2017-04-03 DIAGNOSIS — Z01812 Encounter for preprocedural laboratory examination: Secondary | ICD-10-CM | POA: Insufficient documentation

## 2017-04-03 DIAGNOSIS — Z01810 Encounter for preprocedural cardiovascular examination: Secondary | ICD-10-CM | POA: Diagnosis not present

## 2017-04-03 DIAGNOSIS — M1712 Unilateral primary osteoarthritis, left knee: Secondary | ICD-10-CM | POA: Diagnosis not present

## 2017-04-03 HISTORY — DX: Gastro-esophageal reflux disease without esophagitis: K21.9

## 2017-04-03 HISTORY — DX: Adverse effect of unspecified anesthetic, initial encounter: T41.45XA

## 2017-04-03 HISTORY — DX: Other complications of anesthesia, initial encounter: T88.59XA

## 2017-04-03 LAB — APTT: APTT: 28 s (ref 24–36)

## 2017-04-03 LAB — PROTIME-INR
INR: 0.96
Prothrombin Time: 12.7 seconds (ref 11.4–15.2)

## 2017-04-03 LAB — ABO/RH: ABO/RH(D): O POS

## 2017-04-03 LAB — SURGICAL PCR SCREEN
MRSA, PCR: NEGATIVE
Staphylococcus aureus: POSITIVE — AB

## 2017-04-03 LAB — GLUCOSE, CAPILLARY: Glucose-Capillary: 170 mg/dL — ABNORMAL HIGH (ref 65–99)

## 2017-04-06 ENCOUNTER — Ambulatory Visit: Payer: Self-pay | Admitting: Orthopedic Surgery

## 2017-04-06 NOTE — H&P (Signed)
Julie Fischer DOB: 1960-08-13 Married / Language: Cleophus Molt / Race: White Female Date of admission: April 08, 2017   Chief complaint: Left knee pain History of Present Illness The patient is a 56 year old female who comes in for a preoperative History and Physical. The patient is scheduled for a left total knee arthroplasty to be performed by Dr. Dione Plover. Aluisio, MD at [facility] on 04-08-2017. The patient is a 56 year old female who presented for follow up of their knee. The patient is being followed for their left knee pain and osteoarthritis. They are now months out from Euflexxa series. Symptoms reported include: pain, swelling, aching, stiffness, locking and difficulty ambulating. The patient feels that they are doing poorly. Current treatment includes: home exercise program (she states that exercising seems to make her knee pain worse) and application of ice. The following medication has been used for pain control: Tylenol. The patient has not gotten any relief of their symptoms with Cortisone injections or viscosupplementation. The injections unfortunately were not beneficial. The LEFT knee is hurting at all times. It is limiting what she can and cannot do. She is at a stage her she feels like she needs to have something done with this knee. She is ready to proceed with surgery. They have been treated conservatively in the past for the above stated problem and despite conservative measures, they continue to have progressive pain and severe functional limitations and dysfunction. They have failed non-operative management including home exercise, medications, and injections. It is felt that they would benefit from undergoing total joint replacement. Risks and benefits of the procedure have been discussed with the patient and they elect to proceed with surgery. There are no active contraindications to surgery such as ongoing infection or rapidly progressive neurological  disease.  Problem List/Past Medical Primary osteoarthritis of left knee (M17.12)  Breast Cancer  Right-sided Depression  Diabetes Mellitus, Type II  Sleep Apnea  uses CPAP  Allergies Penicillins  Rash. Heparin (Porcine) in NaCl (PF) *ANTICOAGULANTS*  Drop in platelets Niacin *VITAMINS*  Flushing  Family History  Cancer  Brother, Father, Mother. Chronic Obstructive Lung Disease  Paternal Grandmother. Depression  Mother. Diabetes Mellitus  Maternal Grandmother. Drug / Alcohol Addiction  Brother, Mother. Heart Disease  Brother. Hypertension  Brother.  Social History  Children  0 Current drinker  09/27/2016: Currently drinks wine and hard liquor only occasionally per week Current work status  seeking work Exercise  Exercises rarely; does running / walking Living situation  live with spouse Marital status  married No history of drug/alcohol rehab  Not under pain contract  Number of flights of stairs before winded  1 Tobacco / smoke exposure  09/27/2016: yes Tobacco use  Former smoker. 09/27/2016: smoke(d) 1 1/2 pack(s) per day Post-Surgical Plans  Home With Family, Home, Straight to Outpatient Therapy.  Medication History Paxil (40MG  Tablet, Oral) Active. MetFORMIN HCl ER (500MG  Tablet ER 24HR, Oral) Active. Lisinopril (10MG  Tablet, Oral) Active. Simvastatin (20MG  Tablet, Oral) Active. Fish Oil (435MG  Capsule, Oral) Active. Calcium + Vit D3 Active. Hair, Skin, & Nail Vitamin Active. Gas-X Active. Beeno Active. Alendronate Sodium Active. Gummy Vites Active. Tylenol (325MG  Tablet, 1 (one) Oral) Active. Aleve (220MG  Capsule, 1 (one) Oral) Active. Ambien (10MG  Tablet, Oral) Active. Vitamin E (200UNIT Capsule, 1 (one) Oral) Active. Vitamin D (1000UNIT Tablet, Oral) Active. Lysine HCl Active. Claritin (10MG  Capsule, Oral) Active. Norco (5-325MG  Tablet, 1 (one) Oral at bedtime, Taken starting 02/28/2017) Active.  Past  Surgical History Appendectomy  Date: 27. Breast Mass; Local Excision  right Foot Surgery  left Hysterectomy  partial (non-cancerous) Tonsillectomy  Gastric Bypass Surgery  Date: 2004.    Review of Systems General Not Present- Chills, Fatigue, Fever, Memory Loss, Night Sweats, Weight Gain and Weight Loss. Skin Not Present- Eczema, Hives, Itching, Lesions and Rash. HEENT Not Present- Dentures, Double Vision, Headache, Hearing Loss, Tinnitus and Visual Loss. Respiratory Not Present- Allergies, Chronic Cough, Coughing up blood, Shortness of breath at rest and Shortness of breath with exertion. Cardiovascular Not Present- Chest Pain, Difficulty Breathing Lying Down, Murmur, Palpitations, Racing/skipping heartbeats and Swelling. Gastrointestinal Not Present- Abdominal Pain, Bloody Stool, Constipation, Diarrhea, Difficulty Swallowing, Heartburn, Jaundice, Loss of appetitie, Nausea and Vomiting. Female Genitourinary Not Present- Blood in Urine, Discharge, Flank Pain, Incontinence, Painful Urination, Urgency, Urinary frequency, Urinary Retention, Urinating at Night and Weak urinary stream. Musculoskeletal Present- Joint Pain. Not Present- Back Pain, Joint Swelling, Morning Stiffness, Muscle Pain, Muscle Weakness and Spasms. Neurological Not Present- Blackout spells, Difficulty with balance, Dizziness, Paralysis, Tremor and Weakness. Psychiatric Present- Depression. Not Present- Insomnia.  Vitals  Weight: 152 lb Height: 57in Weight was reported by patient. Height was reported by patient. Body Surface Area: 1.6 m Body Mass Index: 32.89 kg/m  Pulse: 68 (Regular)  BP: 108/74 (Sitting, Left Arm, Standard) AVOID RIGHT ARM FOR BP'S AND IV'S   Physical Exam General Mental Status -Alert, cooperative and good historian. General Appearance-pleasant, Not in acute distress. Orientation-Oriented X3. Build & Nutrition-Well nourished and Well developed.  Head and  Neck Head-normocephalic, atraumatic . Neck Global Assessment - supple, no bruit auscultated on the right, no bruit auscultated on the left.  Eye Pupil - Bilateral-Regular and Round. Motion - Bilateral-EOMI.  Chest and Lung Exam Auscultation Breath sounds - clear at anterior chest wall and clear at posterior chest wall. Adventitious sounds - No Adventitious sounds.  Cardiovascular Auscultation Rhythm - Regular rate and rhythm. Heart Sounds - S1 WNL and S2 WNL. Murmurs & Other Heart Sounds - Auscultation of the heart reveals - No Murmurs.  Abdomen Palpation/Percussion Tenderness - Abdomen is non-tender to palpation. Rigidity (guarding) - Abdomen is soft. Auscultation Auscultation of the abdomen reveals - Bowel sounds normal.  Female Genitourinary Note: Not done, not pertinent to present illness   Musculoskeletal Note: Her LEFT hip has normal range of motion without discomfort. Her LEFT knee shows no effusion. Her range is about 5-125. There is marked crepitus on range of motion with tenderness medial greater than lateral and no instability noted. Pulses, sensation and motor intact both lower extremities.  I reviewed her x-rays and she has bone-on-bone arthritis in the medial and patellofemoral compartments of the knee.   Assessment & Plan  Primary osteoarthritis of left knee (M17.12)  Note:Surgical Plans: Left Total Knee Replacement  Disposition: Home with husband, Straight to outpatient  PCP: Dr. Rita Ohara - Patient has been seen preoperatively and felt to be stable for surgery.  Topical TXA  AVOID RIGHT ARM FOR BP'S AND IV'S  Anesthesia Issues: None  Patient was instructed on what medications to stop prior to surgery.  Signed electronically by Joelene Millin, III PA-C

## 2017-04-06 NOTE — H&P (View-Only) (Signed)
Julie Fischer DOB: 08/01/1960 Married / Language: Cleophus Molt / Race: White Female Date of admission: April 08, 2017   Chief complaint: Left knee pain History of Present Illness The patient is a 56 year old female who comes in for a preoperative History and Physical. The patient is scheduled for a left total knee arthroplasty to be performed by Dr. Dione Plover. Aluisio, MD at [facility] on 04-08-2017. The patient is a 56 year old female who presented for follow up of their knee. The patient is being followed for their left knee pain and osteoarthritis. They are now months out from Euflexxa series. Symptoms reported include: pain, swelling, aching, stiffness, locking and difficulty ambulating. The patient feels that they are doing poorly. Current treatment includes: home exercise program (she states that exercising seems to make her knee pain worse) and application of ice. The following medication has been used for pain control: Tylenol. The patient has not gotten any relief of their symptoms with Cortisone injections or viscosupplementation. The injections unfortunately were not beneficial. The LEFT knee is hurting at all times. It is limiting what she can and cannot do. She is at a stage her she feels like she needs to have something done with this knee. She is ready to proceed with surgery. They have been treated conservatively in the past for the above stated problem and despite conservative measures, they continue to have progressive pain and severe functional limitations and dysfunction. They have failed non-operative management including home exercise, medications, and injections. It is felt that they would benefit from undergoing total joint replacement. Risks and benefits of the procedure have been discussed with the patient and they elect to proceed with surgery. There are no active contraindications to surgery such as ongoing infection or rapidly progressive neurological  disease.  Problem List/Past Medical Primary osteoarthritis of left knee (M17.12)  Breast Cancer  Right-sided Depression  Diabetes Mellitus, Type II  Sleep Apnea  uses CPAP  Allergies Penicillins  Rash. Heparin (Porcine) in NaCl (PF) *ANTICOAGULANTS*  Drop in platelets Niacin *VITAMINS*  Flushing  Family History  Cancer  Brother, Father, Mother. Chronic Obstructive Lung Disease  Paternal Grandmother. Depression  Mother. Diabetes Mellitus  Maternal Grandmother. Drug / Alcohol Addiction  Brother, Mother. Heart Disease  Brother. Hypertension  Brother.  Social History  Children  0 Current drinker  09/27/2016: Currently drinks wine and hard liquor only occasionally per week Current work status  seeking work Exercise  Exercises rarely; does running / walking Living situation  live with spouse Marital status  married No history of drug/alcohol rehab  Not under pain contract  Number of flights of stairs before winded  1 Tobacco / smoke exposure  09/27/2016: yes Tobacco use  Former smoker. 09/27/2016: smoke(d) 1 1/2 pack(s) per day Post-Surgical Plans  Home With Family, Home, Straight to Outpatient Therapy.  Medication History Paxil (40MG  Tablet, Oral) Active. MetFORMIN HCl ER (500MG  Tablet ER 24HR, Oral) Active. Lisinopril (10MG  Tablet, Oral) Active. Simvastatin (20MG  Tablet, Oral) Active. Fish Oil (435MG  Capsule, Oral) Active. Calcium + Vit D3 Active. Hair, Skin, & Nail Vitamin Active. Gas-X Active. Beeno Active. Alendronate Sodium Active. Gummy Vites Active. Tylenol (325MG  Tablet, 1 (one) Oral) Active. Aleve (220MG  Capsule, 1 (one) Oral) Active. Ambien (10MG  Tablet, Oral) Active. Vitamin E (200UNIT Capsule, 1 (one) Oral) Active. Vitamin D (1000UNIT Tablet, Oral) Active. Lysine HCl Active. Claritin (10MG  Capsule, Oral) Active. Norco (5-325MG  Tablet, 1 (one) Oral at bedtime, Taken starting 02/28/2017) Active.  Past  Surgical History Appendectomy  Date: 9. Breast Mass; Local Excision  right Foot Surgery  left Hysterectomy  partial (non-cancerous) Tonsillectomy  Gastric Bypass Surgery  Date: 2004.    Review of Systems General Not Present- Chills, Fatigue, Fever, Memory Loss, Night Sweats, Weight Gain and Weight Loss. Skin Not Present- Eczema, Hives, Itching, Lesions and Rash. HEENT Not Present- Dentures, Double Vision, Headache, Hearing Loss, Tinnitus and Visual Loss. Respiratory Not Present- Allergies, Chronic Cough, Coughing up blood, Shortness of breath at rest and Shortness of breath with exertion. Cardiovascular Not Present- Chest Pain, Difficulty Breathing Lying Down, Murmur, Palpitations, Racing/skipping heartbeats and Swelling. Gastrointestinal Not Present- Abdominal Pain, Bloody Stool, Constipation, Diarrhea, Difficulty Swallowing, Heartburn, Jaundice, Loss of appetitie, Nausea and Vomiting. Female Genitourinary Not Present- Blood in Urine, Discharge, Flank Pain, Incontinence, Painful Urination, Urgency, Urinary frequency, Urinary Retention, Urinating at Night and Weak urinary stream. Musculoskeletal Present- Joint Pain. Not Present- Back Pain, Joint Swelling, Morning Stiffness, Muscle Pain, Muscle Weakness and Spasms. Neurological Not Present- Blackout spells, Difficulty with balance, Dizziness, Paralysis, Tremor and Weakness. Psychiatric Present- Depression. Not Present- Insomnia.  Vitals  Weight: 152 lb Height: 57in Weight was reported by patient. Height was reported by patient. Body Surface Area: 1.6 m Body Mass Index: 32.89 kg/m  Pulse: 68 (Regular)  BP: 108/74 (Sitting, Left Arm, Standard) AVOID RIGHT ARM FOR BP'S AND IV'S   Physical Exam General Mental Status -Alert, cooperative and good historian. General Appearance-pleasant, Not in acute distress. Orientation-Oriented X3. Build & Nutrition-Well nourished and Well developed.  Head and  Neck Head-normocephalic, atraumatic . Neck Global Assessment - supple, no bruit auscultated on the right, no bruit auscultated on the left.  Eye Pupil - Bilateral-Regular and Round. Motion - Bilateral-EOMI.  Chest and Lung Exam Auscultation Breath sounds - clear at anterior chest wall and clear at posterior chest wall. Adventitious sounds - No Adventitious sounds.  Cardiovascular Auscultation Rhythm - Regular rate and rhythm. Heart Sounds - S1 WNL and S2 WNL. Murmurs & Other Heart Sounds - Auscultation of the heart reveals - No Murmurs.  Abdomen Palpation/Percussion Tenderness - Abdomen is non-tender to palpation. Rigidity (guarding) - Abdomen is soft. Auscultation Auscultation of the abdomen reveals - Bowel sounds normal.  Female Genitourinary Note: Not done, not pertinent to present illness   Musculoskeletal Note: Her LEFT hip has normal range of motion without discomfort. Her LEFT knee shows no effusion. Her range is about 5-125. There is marked crepitus on range of motion with tenderness medial greater than lateral and no instability noted. Pulses, sensation and motor intact both lower extremities.  I reviewed her x-rays and she has bone-on-bone arthritis in the medial and patellofemoral compartments of the knee.   Assessment & Plan  Primary osteoarthritis of left knee (M17.12)  Note:Surgical Plans: Left Total Knee Replacement  Disposition: Home with husband, Straight to outpatient  PCP: Dr. Rita Ohara - Patient has been seen preoperatively and felt to be stable for surgery.  Topical TXA  AVOID RIGHT ARM FOR BP'S AND IV'S  Anesthesia Issues: None  Patient was instructed on what medications to stop prior to surgery.  Signed electronically by Joelene Millin, III PA-C

## 2017-04-07 NOTE — Anesthesia Preprocedure Evaluation (Signed)
Anesthesia Evaluation  Patient identified by MRN, date of birth, ID band Patient awake    Reviewed: Allergy & Precautions, H&P , Patient's Chart, lab work & pertinent test results, reviewed documented beta blocker date and time   Airway Mallampati: II  TM Distance: >3 FB Neck ROM: full    Dental no notable dental hx.    Pulmonary former smoker,    Pulmonary exam normal breath sounds clear to auscultation       Cardiovascular hypertension, On Medications  Rhythm:regular Rate:Normal     Neuro/Psych    GI/Hepatic Medicated,  Endo/Other  diabetes  Renal/GU      Musculoskeletal   Abdominal   Peds  Hematology   Anesthesia Other Findings   Reproductive/Obstetrics                             Anesthesia Physical Anesthesia Plan  ASA: II  Anesthesia Plan: Spinal   Post-op Pain Management:    Induction:   PONV Risk Score and Plan: 2 and Dexamethasone, Ondansetron and Treatment may vary due to age or medical condition  Airway Management Planned:   Additional Equipment:   Intra-op Plan:   Post-operative Plan:   Informed Consent: I have reviewed the patients History and Physical, chart, labs and discussed the procedure including the risks, benefits and alternatives for the proposed anesthesia with the patient or authorized representative who has indicated his/her understanding and acceptance.   Dental Advisory Given  Plan Discussed with: CRNA and Surgeon  Anesthesia Plan Comments: (  )        Anesthesia Quick Evaluation

## 2017-04-08 ENCOUNTER — Encounter (HOSPITAL_COMMUNITY): Payer: Self-pay | Admitting: *Deleted

## 2017-04-08 ENCOUNTER — Inpatient Hospital Stay (HOSPITAL_COMMUNITY): Payer: 59 | Admitting: Anesthesiology

## 2017-04-08 ENCOUNTER — Other Ambulatory Visit: Payer: Self-pay

## 2017-04-08 ENCOUNTER — Inpatient Hospital Stay (HOSPITAL_COMMUNITY)
Admission: RE | Admit: 2017-04-08 | Discharge: 2017-04-10 | DRG: 470 | Disposition: A | Discharge status: Home / Self Care | Payer: 59 | Source: Ambulatory Visit | Attending: Orthopedic Surgery | Admitting: Orthopedic Surgery

## 2017-04-08 ENCOUNTER — Encounter (HOSPITAL_COMMUNITY)
Admission: RE | Disposition: A | Discharge status: Home / Self Care | Payer: Self-pay | Source: Ambulatory Visit | Attending: Orthopedic Surgery

## 2017-04-08 DIAGNOSIS — Z809 Family history of malignant neoplasm, unspecified: Secondary | ICD-10-CM

## 2017-04-08 DIAGNOSIS — G4730 Sleep apnea, unspecified: Secondary | ICD-10-CM | POA: Diagnosis present

## 2017-04-08 DIAGNOSIS — Z818 Family history of other mental and behavioral disorders: Secondary | ICD-10-CM

## 2017-04-08 DIAGNOSIS — M810 Age-related osteoporosis without current pathological fracture: Secondary | ICD-10-CM | POA: Diagnosis present

## 2017-04-08 DIAGNOSIS — Z825 Family history of asthma and other chronic lower respiratory diseases: Secondary | ICD-10-CM

## 2017-04-08 DIAGNOSIS — G4733 Obstructive sleep apnea (adult) (pediatric): Secondary | ICD-10-CM | POA: Diagnosis not present

## 2017-04-08 DIAGNOSIS — M25562 Pain in left knee: Secondary | ICD-10-CM | POA: Diagnosis not present

## 2017-04-08 DIAGNOSIS — Z7984 Long term (current) use of oral hypoglycemic drugs: Secondary | ICD-10-CM | POA: Diagnosis not present

## 2017-04-08 DIAGNOSIS — G4700 Insomnia, unspecified: Secondary | ICD-10-CM

## 2017-04-08 DIAGNOSIS — Z9221 Personal history of antineoplastic chemotherapy: Secondary | ICD-10-CM

## 2017-04-08 DIAGNOSIS — E119 Type 2 diabetes mellitus without complications: Secondary | ICD-10-CM | POA: Diagnosis present

## 2017-04-08 DIAGNOSIS — Z7983 Long term (current) use of bisphosphonates: Secondary | ICD-10-CM | POA: Diagnosis not present

## 2017-04-08 DIAGNOSIS — Z923 Personal history of irradiation: Secondary | ICD-10-CM | POA: Diagnosis not present

## 2017-04-08 DIAGNOSIS — M1712 Unilateral primary osteoarthritis, left knee: Secondary | ICD-10-CM | POA: Diagnosis not present

## 2017-04-08 DIAGNOSIS — Z853 Personal history of malignant neoplasm of breast: Secondary | ICD-10-CM

## 2017-04-08 DIAGNOSIS — F329 Major depressive disorder, single episode, unspecified: Secondary | ICD-10-CM | POA: Diagnosis present

## 2017-04-08 DIAGNOSIS — I10 Essential (primary) hypertension: Secondary | ICD-10-CM | POA: Diagnosis not present

## 2017-04-08 DIAGNOSIS — G8918 Other acute postprocedural pain: Secondary | ICD-10-CM | POA: Diagnosis not present

## 2017-04-08 DIAGNOSIS — K219 Gastro-esophageal reflux disease without esophagitis: Secondary | ICD-10-CM | POA: Diagnosis present

## 2017-04-08 DIAGNOSIS — Z9884 Bariatric surgery status: Secondary | ICD-10-CM

## 2017-04-08 DIAGNOSIS — E785 Hyperlipidemia, unspecified: Secondary | ICD-10-CM | POA: Diagnosis present

## 2017-04-08 DIAGNOSIS — M1710 Unilateral primary osteoarthritis, unspecified knee: Secondary | ICD-10-CM | POA: Diagnosis not present

## 2017-04-08 DIAGNOSIS — E7800 Pure hypercholesterolemia, unspecified: Secondary | ICD-10-CM | POA: Diagnosis not present

## 2017-04-08 DIAGNOSIS — Z8249 Family history of ischemic heart disease and other diseases of the circulatory system: Secondary | ICD-10-CM

## 2017-04-08 DIAGNOSIS — Z833 Family history of diabetes mellitus: Secondary | ICD-10-CM | POA: Diagnosis not present

## 2017-04-08 DIAGNOSIS — Z87891 Personal history of nicotine dependence: Secondary | ICD-10-CM

## 2017-04-08 DIAGNOSIS — M179 Osteoarthritis of knee, unspecified: Secondary | ICD-10-CM | POA: Diagnosis present

## 2017-04-08 HISTORY — PX: TOTAL KNEE ARTHROPLASTY: SHX125

## 2017-04-08 LAB — GLUCOSE, CAPILLARY
GLUCOSE-CAPILLARY: 143 mg/dL — AB (ref 65–99)
GLUCOSE-CAPILLARY: 274 mg/dL — AB (ref 65–99)
Glucose-Capillary: 162 mg/dL — ABNORMAL HIGH (ref 65–99)
Glucose-Capillary: 252 mg/dL — ABNORMAL HIGH (ref 65–99)
Glucose-Capillary: 324 mg/dL — ABNORMAL HIGH (ref 65–99)

## 2017-04-08 LAB — TYPE AND SCREEN
ABO/RH(D): O POS
ANTIBODY SCREEN: NEGATIVE

## 2017-04-08 SURGERY — ARTHROPLASTY, KNEE, TOTAL
Anesthesia: Spinal | Site: Knee | Laterality: Left

## 2017-04-08 MED ORDER — METOCLOPRAMIDE HCL 5 MG/ML IJ SOLN: 5.0000 mg | Freq: Three times a day (TID) | INTRAMUSCULAR | Status: DC | PRN

## 2017-04-08 MED ORDER — DOCUSATE SODIUM 100 MG PO CAPS
100.0000 mg | ORAL_CAPSULE | Freq: Two times a day (BID) | ORAL | Status: DC
  Administered 2017-04-08 – 2017-04-10 (×4): 100 mg via ORAL
  Filled 2017-04-08 (×4): qty 1

## 2017-04-08 MED ORDER — PROPOFOL 10 MG/ML IV BOLUS
INTRAVENOUS | Status: AC
  Filled 2017-04-08: qty 20

## 2017-04-08 MED ORDER — ONDANSETRON HCL 4 MG/2ML IJ SOLN
4.0000 mg | Freq: Four times a day (QID) | INTRAMUSCULAR | Status: DC | PRN
  Administered 2017-04-08: 4 mg via INTRAVENOUS
  Filled 2017-04-08: qty 2

## 2017-04-08 MED ORDER — LIDOCAINE 2% (20 MG/ML) 5 ML SYRINGE
INTRAMUSCULAR | Status: AC
Start: 2017-04-08 — End: 2017-04-08
  Filled 2017-04-08: qty 5

## 2017-04-08 MED ORDER — CHLORHEXIDINE GLUCONATE 4 % EX LIQD: 60.0000 mL | Freq: Once | CUTANEOUS | Status: DC

## 2017-04-08 MED ORDER — GABAPENTIN 300 MG PO CAPS
ORAL_CAPSULE | ORAL | Status: AC
  Administered 2017-04-08: 300 mg via ORAL
  Filled 2017-04-08: qty 1

## 2017-04-08 MED ORDER — ACETAMINOPHEN 10 MG/ML IV SOLN
INTRAVENOUS | Status: AC
  Filled 2017-04-08: qty 100

## 2017-04-08 MED ORDER — ACETAMINOPHEN 10 MG/ML IV SOLN
1000.0000 mg | Freq: Once | INTRAVENOUS | Status: AC
  Administered 2017-04-08: 1000 mg via INTRAVENOUS

## 2017-04-08 MED ORDER — MORPHINE SULFATE (PF) 4 MG/ML IV SOLN
1.0000 mg | INTRAVENOUS | Status: DC | PRN
  Administered 2017-04-08 – 2017-04-09 (×3): 1 mg via INTRAVENOUS
  Filled 2017-04-08 (×3): qty 1

## 2017-04-08 MED ORDER — DEXAMETHASONE SODIUM PHOSPHATE 10 MG/ML IJ SOLN
10.0000 mg | Freq: Once | INTRAMUSCULAR | Status: AC
  Administered 2017-04-09: 10:00:00 10 mg via INTRAVENOUS
  Filled 2017-04-08: qty 1

## 2017-04-08 MED ORDER — PROPOFOL 500 MG/50ML IV EMUL
INTRAVENOUS | Status: DC | PRN
  Administered 2017-04-08: 40 ug/kg/min via INTRAVENOUS

## 2017-04-08 MED ORDER — ONDANSETRON HCL 4 MG/2ML IJ SOLN
INTRAMUSCULAR | Status: AC
Start: 2017-04-08 — End: 2017-04-08
  Filled 2017-04-08: qty 2

## 2017-04-08 MED ORDER — VANCOMYCIN HCL IN DEXTROSE 1-5 GM/200ML-% IV SOLN
1000.0000 mg | Freq: Once | INTRAVENOUS | Status: AC
  Administered 2017-04-08: 18:00:00 1000 mg via INTRAVENOUS
  Filled 2017-04-08: qty 200

## 2017-04-08 MED ORDER — ONDANSETRON HCL 4 MG/2ML IJ SOLN
INTRAMUSCULAR | Status: DC | PRN
  Administered 2017-04-08: 4 mg via INTRAVENOUS

## 2017-04-08 MED ORDER — ZOLPIDEM TARTRATE 5 MG PO TABS
5.0000 mg | ORAL_TABLET | Freq: Every evening | ORAL | Status: DC | PRN
  Administered 2017-04-09 (×2): 5 mg via ORAL
  Filled 2017-04-08 (×2): qty 1

## 2017-04-08 MED ORDER — SODIUM CHLORIDE 0.9 % IV SOLN
INTRAVENOUS | Status: DC
  Administered 2017-04-08: 75 mL/h via INTRAVENOUS

## 2017-04-08 MED ORDER — VANCOMYCIN HCL IN DEXTROSE 1-5 GM/200ML-% IV SOLN
INTRAVENOUS | Status: AC
  Filled 2017-04-08: qty 200

## 2017-04-08 MED ORDER — STERILE WATER FOR IRRIGATION IR SOLN
Status: DC | PRN
  Administered 2017-04-08: 3000 mL

## 2017-04-08 MED ORDER — GABAPENTIN 300 MG PO CAPS
300.0000 mg | ORAL_CAPSULE | Freq: Once | ORAL | Status: AC
  Administered 2017-04-08: 300 mg via ORAL

## 2017-04-08 MED ORDER — FENTANYL CITRATE (PF) 100 MCG/2ML IJ SOLN
INTRAMUSCULAR | Status: AC
  Filled 2017-04-08: qty 2

## 2017-04-08 MED ORDER — MIDAZOLAM HCL 2 MG/2ML IJ SOLN
INTRAMUSCULAR | Status: AC
  Filled 2017-04-08: qty 2

## 2017-04-08 MED ORDER — OXYCODONE HCL 5 MG PO TABS
5.0000 mg | ORAL_TABLET | ORAL | Status: DC | PRN
  Administered 2017-04-08 (×2): 5 mg via ORAL
  Filled 2017-04-08 (×2): qty 1

## 2017-04-08 MED ORDER — EPHEDRINE SULFATE-NACL 50-0.9 MG/10ML-% IV SOSY
PREFILLED_SYRINGE | INTRAVENOUS | Status: DC | PRN
  Administered 2017-04-08: 5 mg via INTRAVENOUS
  Administered 2017-04-08: 10 mg via INTRAVENOUS
  Administered 2017-04-08: 5 mg via INTRAVENOUS

## 2017-04-08 MED ORDER — BISACODYL 10 MG RE SUPP: 10.0000 mg | Freq: Every day | RECTAL | Status: DC | PRN

## 2017-04-08 MED ORDER — EPHEDRINE 5 MG/ML INJ
INTRAVENOUS | Status: AC
  Filled 2017-04-08: qty 10

## 2017-04-08 MED ORDER — ONDANSETRON HCL 4 MG PO TABS: 4.0000 mg | ORAL_TABLET | Freq: Four times a day (QID) | ORAL | Status: DC | PRN

## 2017-04-08 MED ORDER — SODIUM CHLORIDE 0.9 % IR SOLN
Status: DC | PRN
  Administered 2017-04-08: 1000 mL

## 2017-04-08 MED ORDER — VANCOMYCIN HCL IN DEXTROSE 1-5 GM/200ML-% IV SOLN
1000.0000 mg | INTRAVENOUS | Status: AC
  Administered 2017-04-08: 1000 mg via INTRAVENOUS

## 2017-04-08 MED ORDER — PHENOL 1.4 % MT LIQD: 1.0000 | OROMUCOSAL | Status: DC | PRN

## 2017-04-08 MED ORDER — POLYETHYLENE GLYCOL 3350 17 G PO PACK: 17.0000 g | PACK | Freq: Every day | ORAL | Status: DC | PRN

## 2017-04-08 MED ORDER — PROPOFOL 10 MG/ML IV BOLUS
INTRAVENOUS | Status: AC
  Filled 2017-04-08: qty 40

## 2017-04-08 MED ORDER — LORATADINE 10 MG PO TABS: 10.0000 mg | ORAL_TABLET | Freq: Every day | ORAL | Status: DC | PRN

## 2017-04-08 MED ORDER — CYCLOSPORINE 0.05 % OP EMUL: 1.0000 [drp] | Freq: Two times a day (BID) | OPHTHALMIC | Status: DC

## 2017-04-08 MED ORDER — SODIUM CHLORIDE 0.9 % IJ SOLN
INTRAMUSCULAR | Status: AC
  Filled 2017-04-08: qty 50

## 2017-04-08 MED ORDER — LIDOCAINE 2% (20 MG/ML) 5 ML SYRINGE
INTRAMUSCULAR | Status: DC | PRN
  Administered 2017-04-08: 100 mg via INTRAVENOUS

## 2017-04-08 MED ORDER — METOCLOPRAMIDE HCL 5 MG PO TABS: 5.0000 mg | ORAL_TABLET | Freq: Three times a day (TID) | ORAL | Status: DC | PRN

## 2017-04-08 MED ORDER — TRANEXAMIC ACID 1000 MG/10ML IV SOLN
INTRAVENOUS | Status: AC | PRN
  Administered 2017-04-08: 2000 mg via TOPICAL

## 2017-04-08 MED ORDER — DIPHENHYDRAMINE HCL 12.5 MG/5ML PO ELIX: 12.5000 mg | ORAL_SOLUTION | ORAL | Status: DC | PRN

## 2017-04-08 MED ORDER — ACETAMINOPHEN 500 MG PO TABS: 1000.0000 mg | ORAL_TABLET | Freq: Four times a day (QID) | ORAL | Status: DC

## 2017-04-08 MED ORDER — PAROXETINE HCL 20 MG PO TABS
40.0000 mg | ORAL_TABLET | Freq: Every morning | ORAL | Status: DC
  Administered 2017-04-08 – 2017-04-10 (×3): 40 mg via ORAL
  Filled 2017-04-08 (×3): qty 2

## 2017-04-08 MED ORDER — ACETAMINOPHEN 325 MG PO TABS
650.0000 mg | ORAL_TABLET | ORAL | Status: DC | PRN
  Administered 2017-04-09 – 2017-04-10 (×4): 650 mg via ORAL
  Filled 2017-04-08 (×4): qty 2

## 2017-04-08 MED ORDER — BUPIVACAINE LIPOSOME 1.3 % IJ SUSP
20.0000 mL | Freq: Once | INTRAMUSCULAR | Status: DC
  Filled 2017-04-08: qty 20

## 2017-04-08 MED ORDER — MIDAZOLAM HCL 5 MG/5ML IJ SOLN
INTRAMUSCULAR | Status: DC | PRN
  Administered 2017-04-08: 2 mg via INTRAVENOUS

## 2017-04-08 MED ORDER — ACETAMINOPHEN 500 MG PO TABS
1000.0000 mg | ORAL_TABLET | Freq: Four times a day (QID) | ORAL | Status: AC
  Administered 2017-04-08 – 2017-04-09 (×4): 1000 mg via ORAL
  Filled 2017-04-08 (×4): qty 2

## 2017-04-08 MED ORDER — METHOCARBAMOL 1000 MG/10ML IJ SOLN
500.0000 mg | Freq: Four times a day (QID) | INTRAVENOUS | Status: DC | PRN
  Administered 2017-04-09: 500 mg via INTRAVENOUS
  Filled 2017-04-08: qty 550

## 2017-04-08 MED ORDER — BUPIVACAINE LIPOSOME 1.3 % IJ SUSP
INTRAMUSCULAR | Status: DC | PRN
  Administered 2017-04-08: 20 mL

## 2017-04-08 MED ORDER — OMEPRAZOLE 20 MG PO CPDR: 20.0000 mg | DELAYED_RELEASE_CAPSULE | Freq: Every day | ORAL | Status: DC | PRN

## 2017-04-08 MED ORDER — METHOCARBAMOL 500 MG PO TABS
500.0000 mg | ORAL_TABLET | Freq: Four times a day (QID) | ORAL | Status: DC | PRN
  Administered 2017-04-08 – 2017-04-10 (×6): 500 mg via ORAL
  Filled 2017-04-08 (×6): qty 1

## 2017-04-08 MED ORDER — OXYCODONE HCL 5 MG PO TABS
10.0000 mg | ORAL_TABLET | ORAL | Status: DC | PRN
  Administered 2017-04-08 – 2017-04-10 (×13): 10 mg via ORAL
  Filled 2017-04-08 (×12): qty 2

## 2017-04-08 MED ORDER — RIVAROXABAN 10 MG PO TABS
10.0000 mg | ORAL_TABLET | Freq: Every day | ORAL | Status: DC
  Administered 2017-04-09 – 2017-04-10 (×2): 10 mg via ORAL
  Filled 2017-04-08 (×2): qty 1

## 2017-04-08 MED ORDER — SIMVASTATIN 20 MG PO TABS
20.0000 mg | ORAL_TABLET | Freq: Every day | ORAL | Status: DC
  Administered 2017-04-08 – 2017-04-09 (×2): 20 mg via ORAL
  Filled 2017-04-08 (×2): qty 1

## 2017-04-08 MED ORDER — FENTANYL CITRATE (PF) 100 MCG/2ML IJ SOLN: 25.0000 ug | INTRAMUSCULAR | Status: DC | PRN

## 2017-04-08 MED ORDER — SODIUM CHLORIDE 0.9 % IJ SOLN
INTRAMUSCULAR | Status: AC
  Filled 2017-04-08: qty 10

## 2017-04-08 MED ORDER — CYCLOSPORINE 0.05 % OP EMUL
1.0000 [drp] | Freq: Two times a day (BID) | OPHTHALMIC | Status: DC
  Administered 2017-04-08 – 2017-04-10 (×4): 1 [drp] via OPHTHALMIC
  Filled 2017-04-08 (×4): qty 1

## 2017-04-08 MED ORDER — SODIUM CHLORIDE 0.9 % IJ SOLN
INTRAMUSCULAR | Status: DC | PRN
  Administered 2017-04-08: 60 mL

## 2017-04-08 MED ORDER — ACETAMINOPHEN 650 MG RE SUPP
650.0000 mg | RECTAL | Status: DC | PRN
Start: 2017-04-09 — End: 2017-04-10

## 2017-04-08 MED ORDER — DEXAMETHASONE SODIUM PHOSPHATE 10 MG/ML IJ SOLN
INTRAMUSCULAR | Status: AC
  Filled 2017-04-08: qty 1

## 2017-04-08 MED ORDER — TRANEXAMIC ACID 1000 MG/10ML IV SOLN
2000.0000 mg | Freq: Once | INTRAVENOUS | Status: DC
  Filled 2017-04-08: qty 20

## 2017-04-08 MED ORDER — FLEET ENEMA 7-19 GM/118ML RE ENEM: 1.0000 | ENEMA | Freq: Once | RECTAL | Status: DC | PRN

## 2017-04-08 MED ORDER — DEXAMETHASONE SODIUM PHOSPHATE 10 MG/ML IJ SOLN
10.0000 mg | Freq: Once | INTRAMUSCULAR | Status: AC
  Administered 2017-04-08: 10 mg via INTRAVENOUS

## 2017-04-08 MED ORDER — OMEPRAZOLE MAGNESIUM 20 MG PO TBEC: 20.0000 mg | DELAYED_RELEASE_TABLET | Freq: Every day | ORAL | Status: DC | PRN

## 2017-04-08 MED ORDER — FENTANYL CITRATE (PF) 100 MCG/2ML IJ SOLN
INTRAMUSCULAR | Status: DC | PRN
  Administered 2017-04-08 (×2): 25 ug via INTRAVENOUS
  Administered 2017-04-08: 50 ug via INTRAVENOUS

## 2017-04-08 MED ORDER — MENTHOL 3 MG MT LOZG: 1.0000 | LOZENGE | OROMUCOSAL | Status: DC | PRN

## 2017-04-08 MED ORDER — LACTATED RINGERS IV SOLN
INTRAVENOUS | Status: DC
  Administered 2017-04-08: 06:00:00 via INTRAVENOUS

## 2017-04-08 SURGICAL SUPPLY — 50 items
BAG DECANTER FOR FLEXI CONT (MISCELLANEOUS) ×3 IMPLANT
BAG ZIPLOCK 12X15 (MISCELLANEOUS) ×3 IMPLANT
BANDAGE ACE 6X5 VEL STRL LF (GAUZE/BANDAGES/DRESSINGS) ×3 IMPLANT
BLADE SAG 18X100X1.27 (BLADE) ×3 IMPLANT
BLADE SAW SGTL 11.0X1.19X90.0M (BLADE) ×3 IMPLANT
BOWL SMART MIX CTS (DISPOSABLE) ×3 IMPLANT
CAPT KNEE TOTAL 3 ATTUNE ×3 IMPLANT
CEMENT HV SMART SET (Cement) ×6 IMPLANT
CLOSURE WOUND 1/2 X4 (GAUZE/BANDAGES/DRESSINGS) ×1
COVER SURGICAL LIGHT HANDLE (MISCELLANEOUS) ×3 IMPLANT
CUFF TOURN SGL QUICK 34 (TOURNIQUET CUFF) ×2
CUFF TRNQT CYL 34X4X40X1 (TOURNIQUET CUFF) ×1 IMPLANT
DECANTER SPIKE VIAL GLASS SM (MISCELLANEOUS) ×3 IMPLANT
DRAPE U-SHAPE 47X51 STRL (DRAPES) ×3 IMPLANT
DRSG ADAPTIC 3X8 NADH LF (GAUZE/BANDAGES/DRESSINGS) ×3 IMPLANT
DRSG PAD ABDOMINAL 8X10 ST (GAUZE/BANDAGES/DRESSINGS) ×3 IMPLANT
DURAPREP 26ML APPLICATOR (WOUND CARE) ×3 IMPLANT
ELECT REM PT RETURN 15FT ADLT (MISCELLANEOUS) ×3 IMPLANT
EVACUATOR 1/8 PVC DRAIN (DRAIN) ×3 IMPLANT
GAUZE SPONGE 4X4 12PLY STRL (GAUZE/BANDAGES/DRESSINGS) ×3 IMPLANT
GLOVE BIO SURGEON STRL SZ7.5 (GLOVE) IMPLANT
GLOVE BIO SURGEON STRL SZ8 (GLOVE) ×3 IMPLANT
GLOVE BIOGEL PI IND STRL 6.5 (GLOVE) IMPLANT
GLOVE BIOGEL PI IND STRL 8 (GLOVE) ×1 IMPLANT
GLOVE BIOGEL PI INDICATOR 6.5 (GLOVE)
GLOVE BIOGEL PI INDICATOR 8 (GLOVE) ×2
GLOVE SURG SS PI 6.5 STRL IVOR (GLOVE) IMPLANT
GOWN STRL REUS W/TWL LRG LVL3 (GOWN DISPOSABLE) ×3 IMPLANT
GOWN STRL REUS W/TWL XL LVL3 (GOWN DISPOSABLE) IMPLANT
HANDPIECE INTERPULSE COAX TIP (DISPOSABLE) ×2
IMMOBILIZER KNEE 20 (SOFTGOODS) ×3
IMMOBILIZER KNEE 20 THIGH 36 (SOFTGOODS) ×1 IMPLANT
MANIFOLD NEPTUNE II (INSTRUMENTS) ×3 IMPLANT
NS IRRIG 1000ML POUR BTL (IV SOLUTION) ×3 IMPLANT
PACK TOTAL KNEE CUSTOM (KITS) ×3 IMPLANT
PAD ABD 8X10 STRL (GAUZE/BANDAGES/DRESSINGS) ×3 IMPLANT
PADDING CAST COTTON 6X4 STRL (CAST SUPPLIES) ×3 IMPLANT
POSITIONER SURGICAL ARM (MISCELLANEOUS) ×3 IMPLANT
SET HNDPC FAN SPRY TIP SCT (DISPOSABLE) ×1 IMPLANT
STRIP CLOSURE SKIN 1/2X4 (GAUZE/BANDAGES/DRESSINGS) ×2 IMPLANT
SUT MNCRL AB 4-0 PS2 18 (SUTURE) ×3 IMPLANT
SUT STRATAFIX 0 PDS 27 VIOLET (SUTURE) ×3
SUT VIC AB 2-0 CT1 27 (SUTURE) ×6
SUT VIC AB 2-0 CT1 TAPERPNT 27 (SUTURE) ×3 IMPLANT
SUTURE STRATFX 0 PDS 27 VIOLET (SUTURE) ×1 IMPLANT
SYR 30ML LL (SYRINGE) ×6 IMPLANT
TRAY FOLEY W/METER SILVER 16FR (SET/KITS/TRAYS/PACK) ×3 IMPLANT
WATER STERILE IRR 1000ML POUR (IV SOLUTION) ×6 IMPLANT
WRAP KNEE MAXI GEL POST OP (GAUZE/BANDAGES/DRESSINGS) ×3 IMPLANT
YANKAUER SUCT BULB TIP 10FT TU (MISCELLANEOUS) ×3 IMPLANT

## 2017-04-08 NOTE — Interval H&P Note (Signed)
  History and Physical Interval Note:  04/08/2017 6:30 AM  Julie Fischer  has presented today for surgery, with the diagnosis of Osteoarthritis Left Knee  The various methods of treatment have been discussed with the patient and family. After consideration of risks, benefits and other options for treatment, the patient has consented to  Procedure(s): LEFT TOTAL KNEE ARTHROPLASTY (Left) as a surgical intervention .  The patient's history has been reviewed, patient examined, no change in status, stable for surgery.  I have reviewed the patient's chart and labs.  Questions were answered to the patient's satisfaction.     Pilar Plate [name]

## 2017-04-08 NOTE — Transfer of Care (Signed)
Immediate Anesthesia Transfer of Care Note  Patient: Julie Fischer  Procedure(s) Performed: LEFT TOTAL KNEE ARTHROPLASTY (Left Knee)  Patient Location: PACU  Anesthesia Type:Spinal  Level of Consciousness: awake, alert  and oriented  Airway & Oxygen Therapy: Patient Spontanous Breathing and Patient connected to face mask oxygen  Post-op Assessment: Report given to RN  Post vital signs: Reviewed and stable  Last Vitals:  Vitals:   04/08/17 0549  BP: 116/77  Pulse: 69  Resp: 16  Temp: 36.7 C  SpO2: 97%    Last Pain:  Vitals:   04/08/17 0549  TempSrc: Oral  PainSc:       Patients Stated Pain Goal: 4 (39/53/20 2334)  Complications: No apparent anesthesia complications

## 2017-04-08 NOTE — Anesthesia Procedure Notes (Signed)
Spinal  Patient location during procedure: OR Staffing Anesthesiologist: Lyndle Herrlich, MD Spinal Block Patient position: sitting Prep: DuraPrep Patient monitoring: heart rate, blood pressure and continuous pulse ox Approach: right paramedian Location: L3-4 Injection technique: single-shot Needle Needle type: Sprotte  Needle gauge: 24 G Needle length: 9 cm Assessment Sensory level: T4 Additional Notes Spinal Dosage in OR  .75% Bupivicaine ml       1.6  LLD x 3 min

## 2017-04-08 NOTE — Anesthesia Postprocedure Evaluation (Signed)
Anesthesia Post Note  Patient: Julie Fischer  Procedure(s) Performed: LEFT TOTAL KNEE ARTHROPLASTY (Left Knee)     Patient location during evaluation: PACU Anesthesia Type: Spinal Level of consciousness: awake Pain management: satisfactory to patient Vital Signs Assessment: post-procedure vital signs reviewed and stable Respiratory status: spontaneous breathing Cardiovascular status: blood pressure returned to baseline Postop Assessment: no headache and spinal receding Anesthetic complications: no    Last Vitals:  Vitals:   04/08/17 1219 04/08/17 1316  BP: (!) 108/59 116/73  Pulse: 98 100  Resp: 16 16  Temp: 36.8 C 36.8 C  SpO2: 97% 98%    Last Pain:  Vitals:   04/08/17 1542  TempSrc:   PainSc: [place]

## 2017-04-08 NOTE — Discharge Instructions (Addendum)
° °Dr. Frank Aluisio °Total Joint Specialist °[facility] °[address] °[place], [place] [address] °[phone] ° °TOTAL KNEE REPLACEMENT POSTOPERATIVE DIRECTIONS ° °Knee Rehabilitation, Guidelines Following Surgery  °Results after knee surgery are often greatly improved when you follow the exercise, range of motion and muscle strengthening exercises prescribed by your doctor. Safety measures are also important to protect the knee from further injury. Any time any of these exercises cause you to have increased pain or swelling in your knee joint, decrease the amount until you are comfortable again and slowly increase them. If you have problems or questions, call your caregiver or physical therapist for advice.  ° °HOME CARE INSTRUCTIONS  °Remove items at home which could result in a fall. This includes throw rugs or furniture in walking pathways.  °· ICE to the affected knee every three hours for 30 minutes at a time and then as needed for pain and swelling.  Continue to use ice on the knee for pain and swelling from surgery. You may notice swelling that will progress down to the foot and ankle.  This is normal after surgery.  Elevate the leg when you are not up walking on it.   °· Continue to use the breathing machine which will help keep your temperature down.  It is common for your temperature to cycle up and down following surgery, especially at night when you are not up moving around and exerting yourself.  The breathing machine keeps your lungs expanded and your temperature down. °· Do not place pillow under knee, focus on keeping the knee straight while resting ° °DIET °You may resume your previous home diet once your are discharged from the hospital. ° °DRESSING / WOUND CARE / SHOWERING °You may shower 3 days after surgery, but keep the wounds dry during showering.  You may use an occlusive plastic wrap (Press'n Seal for example), NO SOAKING/SUBMERGING IN THE BATHTUB.  If the  bandage gets wet, change with a clean dry gauze.  If the incision gets wet, pat the wound dry with a clean towel. °You may start showering once you are discharged home but do not submerge the incision under water. Just pat the incision dry and apply a dry gauze dressing on daily. °Change the surgical dressing daily and reapply a dry dressing each time. ° °ACTIVITY °Walk with your walker as instructed. °Use walker as long as suggested by your caregivers. °Avoid periods of inactivity such as sitting longer than an hour when not asleep. This helps prevent blood clots.  °You may resume a sexual relationship in one month or when given the OK by your doctor.  °You may return to work once you are cleared by your doctor.  °Do not drive a car for 6 weeks or until released by you surgeon.  °Do not drive while taking narcotics. ° °WEIGHT BEARING °Weight bearing as tolerated with assist device (walker, cane, etc) as directed, use it as long as suggested by your surgeon or therapist, typically at least 4-6 weeks. ° °POSTOPERATIVE CONSTIPATION PROTOCOL °Constipation - defined medically as fewer than three stools per week and severe constipation as less than one stool per week. ° °One of the most common issues patients have following surgery is constipation.  Even if you have a regular bowel pattern at home, your normal regimen is likely to be disrupted due to multiple reasons following surgery.  Combination of anesthesia, postoperative narcotics, change in appetite and fluid intake all can affect your bowels.    In order to avoid complications following surgery, here are some recommendations in order to help you during your recovery period. ° °Colace (docusate) - Pick up an over-the-counter form of Colace or another stool softener and take twice a day as long as you are requiring postoperative pain medications.  Take with a full glass of water daily.  If you experience loose stools or diarrhea, hold the colace until you stool forms  back up.  If your symptoms do not get better within 1 week or if they get worse, check with your doctor. ° °Dulcolax (bisacodyl) - Pick up over-the-counter and take as directed by the product packaging as needed to assist with the movement of your bowels.  Take with a full glass of water.  Use this product as needed if not relieved by Colace only.  ° °MiraLax (polyethylene glycol) - Pick up over-the-counter to have on hand.  MiraLax is a solution that will increase the amount of water in your bowels to assist with bowel movements.  Take as directed and can mix with a glass of water, juice, soda, coffee, or tea.  Take if you go more than two days without a movement. °Do not use MiraLax more than once per day. Call your doctor if you are still constipated or irregular after using this medication for 7 days in a row. ° °If you continue to have problems with postoperative constipation, please contact the office for further assistance and recommendations.  If you experience "the worst abdominal pain ever" or develop nausea or vomiting, please contact the office immediatly for further recommendations for treatment. ° °ITCHING ° If you experience itching with your medications, try taking only a single pain pill, or even half a pain pill at a time.  You can also use Benadryl over the counter for itching or also to help with sleep.  ° °TED HOSE STOCKINGS °Wear the elastic stockings on both legs for three weeks following surgery during the day but you may remove then at night for sleeping. ° °MEDICATIONS °See your medication summary on the “After Visit Summary” that the nursing staff will review with you prior to discharge.  You may have some home medications which will be placed on hold until you complete the course of blood thinner medication.  It is important for you to complete the blood thinner medication as prescribed by your surgeon.  Continue your approved medications as instructed at time of  discharge. ° °PRECAUTIONS °If you experience chest pain or shortness of breath - call 911 immediately for transfer to the hospital emergency department.  °If you develop a fever greater that 101 F, purulent drainage from wound, increased redness or drainage from wound, foul odor from the wound/dressing, or calf pain - CONTACT YOUR SURGEON.   °                                                °FOLLOW-UP APPOINTMENTS °Make sure you keep all of your appointments after your operation with your surgeon and caregivers. You should call the office at the above phone number and make an appointment for approximately two weeks after the date of your surgery or on the date instructed by your surgeon outlined in the "After Visit Summary". ° ° °RANGE OF MOTION AND STRENGTHENING EXERCISES  °Rehabilitation of the knee is important following a knee injury or   an operation. After just a few days of immobilization, the muscles of the thigh which control the knee become weakened and shrink (atrophy). Knee exercises are designed to build up the tone and strength of the thigh muscles and to improve knee motion. Often times heat used for twenty to thirty minutes before working out will loosen up your tissues and help with improving the range of motion but do not use heat for the first two weeks following surgery. These exercises can be done on a training (exercise) mat, on the floor, on a table or on a bed. Use what ever works the best and is most comfortable for you Knee exercises include:  °Leg Lifts - While your knee is still immobilized in a splint or cast, you can do straight leg raises. Lift the leg to 60 degrees, hold for 3 sec, and slowly lower the leg. Repeat 10-20 times 2-3 times daily. Perform this exercise against resistance later as your knee gets better.  °Quad and Hamstring Sets - Tighten up the muscle on the front of the thigh (Quad) and hold for 5-10 sec. Repeat this 10-20 times hourly. Hamstring sets are done by pushing the  foot backward against an object and holding for 5-10 sec. Repeat as with quad sets.  °· Leg Slides: Lying on your back, slowly slide your foot toward your buttocks, bending your knee up off the floor (only go as far as is comfortable). Then slowly slide your foot back down until your leg is flat on the floor again. °· Angel Wings: Lying on your back spread your legs to the side as far apart as you can without causing discomfort.  °A rehabilitation program following serious knee injuries can speed recovery and prevent re-injury in the future due to weakened muscles. Contact your doctor or a physical therapist for more information on knee rehabilitation.  ° °IF YOU ARE TRANSFERRED TO A SKILLED REHAB FACILITY °If the patient is transferred to a skilled rehab facility following release from the hospital, a list of the current medications will be sent to the facility for the patient to continue.  When discharged from the skilled rehab facility, please have the facility set up the patient's Home Health Physical Therapy prior to being released. Also, the skilled facility will be responsible for providing the patient with their medications at time of release from the facility to include their pain medication, the muscle relaxants, and their blood thinner medication. If the patient is still at the rehab facility at time of the two week follow up appointment, the skilled rehab facility will also need to assist the patient in arranging follow up appointment in our office and any transportation needs. ° °MAKE SURE YOU:  °Understand these instructions.  °Get help right away if you are not doing well or get worse.  ° ° °Pick up stool softner and laxative for home use following surgery while on pain medications. °Do not submerge incision under water. °Please use good hand washing techniques while changing dressing each day. °May shower starting three days after surgery. °Please use a clean towel to pat the incision dry following  showers. °Continue to use ice for pain and swelling after surgery. °Do not use any lotions or creams on the incision until instructed by your surgeon. ° °Take Xarelto for two and a half more weeks following discharge from the hospital, then discontinue Xarelto. °Once the patient has completed the blood thinner regimen, then take a Baby 81 mg Aspirin daily for three   more weeks.    Information on my medicine - XARELTO (Rivaroxaban)  This medication education was reviewed with me or my healthcare representative as part of my discharge preparation.  The pharmacist that spoke with me during my hospital stay was:  Kara Mead, [facility]  Why was Xarelto prescribed for you? Xarelto was prescribed for you to reduce the risk of blood clots forming after orthopedic surgery. The medical term for these abnormal blood clots is venous thromboembolism (VTE).  What do you need to know about xarelto ? Take your Xarelto ONCE DAILY at the same time every day. You may take it either with or without food.  If you have difficulty swallowing the tablet whole, you may crush it and mix in applesauce just prior to taking your dose.  Take Xarelto exactly as prescribed by your doctor and DO NOT stop taking Xarelto without talking to the doctor who prescribed the medication.  Stopping without other VTE prevention medication to take the place of Xarelto may increase your risk of developing a clot.  After discharge, you should have regular check-up appointments with your healthcare provider that is prescribing your Xarelto.    What do you do if you miss a dose? If you miss a dose, take it as soon as you remember on the same day then continue your regularly scheduled once daily regimen the next day. Do not take two doses of Xarelto on the same day.   Important Safety Information A possible side effect of Xarelto is bleeding. You should call your healthcare provider right away if you experience any of the  following: ? Bleeding from an injury or your nose that does not stop. ? Unusual colored urine (red or dark brown) or unusual colored stools (red or black). ? Unusual bruising for unknown reasons. ? A serious fall or if you hit your head (even if there is no bleeding).  Some medicines may interact with Xarelto and might increase your risk of bleeding while on Xarelto. To help avoid this, consult your healthcare provider or pharmacist prior to using any new prescription or non-prescription medications, including herbals, vitamins, non-steroidal anti-inflammatory drugs (NSAIDs) and supplements.  This website has more information on Xarelto: [email or web address]

## 2017-04-08 NOTE — Evaluation (Signed)
Physical Therapy Evaluation Patient Details Name: Julie Fischer MRN: [id number] DOB: 1961/04/11 Today's Date: 04/08/2017   History of Present Illness  L TKA  Clinical Impression  Pt is s/p TKA resulting in the deficits listed below (see PT Problem List). Pt ambulated 47' with RW, performed TKA exercises with min A. Good progress expected.  Pt will benefit from skilled PT to increase their independence and safety with mobility to allow discharge to the venue listed below.      Follow Up Recommendations DC plan and follow up therapy as arranged by surgeon    Equipment Recommendations  Rolling walker with 5" wheels    Recommendations for Other Services       Precautions / Restrictions        Mobility  Bed Mobility Overal bed mobility: Modified Independent             General bed mobility comments: HOB up  Transfers Overall transfer level: Needs assistance Equipment used: Rolling walker (2 wheeled) Transfers: Sit to/from Stand Sit to Stand: Min assist         General transfer comment: VCs hand placement, min A to rise  Ambulation/Gait Ambulation/Gait assistance: Min guard Ambulation Distance (Feet): 40 Feet Assistive device: Rolling walker (2 wheeled) Gait Pattern/deviations: Step-to pattern;Decreased step length - right;Decreased step length - left   Gait velocity interpretation: Below normal speed for age/gender General Gait Details: VCs sequencing, no LOB  Stairs            Wheelchair Mobility    Modified Rankin (Stroke Patients Only)       Balance Overall balance assessment: Modified Independent                                           Pertinent Vitals/Pain Pain Assessment: 0-10 Pain Score: 7  Pain Location: L knee Pain Descriptors / Indicators: Sore Pain Intervention(s): Limited activity within patient's tolerance;Monitored during session;Premedicated before session;Repositioned;Ice applied    Home Living  Family/patient expects to be discharged to:: Private residence Living Arrangements: Spouse/significant other Available Help at Discharge: Family;Available 24 hours/day Type of Home: House Home Access: Level entry     Home Layout: Two level;Bed/bath upstairs Home Equipment: Walker - 4 wheels;Crutches      Prior Function Level of Independence: Independent               Hand Dominance        Extremity/Trunk Assessment   Upper Extremity Assessment Upper Extremity Assessment: Overall WFL for tasks assessed    Lower Extremity Assessment Lower Extremity Assessment: LLE deficits/detail LLE Deficits / Details: 0-40* AAROM L knee, SLR 3/5, knee ext 3/5    Cervical / Trunk Assessment Cervical / Trunk Assessment: Normal  Communication   Communication: No difficulties  Cognition Arousal/Alertness: Awake/alert Behavior During Therapy: WFL for tasks assessed/performed Overall Cognitive Status: Within Functional Limits for tasks assessed                                        General Comments      Exercises Total Joint Exercises Ankle Circles/Pumps: AROM;Both;10 reps;Supine Quad Sets: AROM;Both;5 reps;Supine Heel Slides: Left;10 reps;Supine;AAROM Straight Leg Raises: AROM;Left;5 reps;Supine Long Arc Quad: AROM;Left;5 reps;Seated Goniometric ROM: 0-40* AAROM L knee   Assessment/Plan    PT Assessment Patient needs  continued PT services  PT Problem List Decreased range of motion;Decreased strength;Decreased mobility;Decreased activity tolerance;Pain;Decreased knowledge of use of DME       PT Treatment Interventions Gait training;DME instruction;Functional mobility training;Therapeutic activities;Stair training    PT Goals (Current goals can be found in the Care Plan section)  Acute Rehab PT Goals Patient Stated Goal: to walk PT Goal Formulation: With patient Time For Goal Achievement: 04/22/17 Potential to Achieve Goals: Good    Frequency  7X/week   Barriers to discharge        Co-evaluation               AM-PAC PT "6 Clicks" Daily Activity  Outcome Measure Difficulty turning over in bed (including adjusting bedclothes, sheets and blankets)?: A Little Difficulty moving from lying on back to sitting on the side of the bed? : A Little Difficulty sitting down on and standing up from a chair with arms (e.g., wheelchair, bedside commode, etc,.)?: Unable Help needed moving to and from a bed to chair (including a wheelchair)?: A Little Help needed walking in hospital room?: A Little Help needed climbing 3-5 steps with a railing? : A Lot 6 Click Score: 15    End of Session Equipment Utilized During Treatment: Gait belt Activity Tolerance: Patient tolerated treatment well Patient left: in chair;with call bell/phone within reach Nurse Communication: Mobility status PT Visit Diagnosis: Muscle weakness (generalized) (M62.81);Difficulty in walking, not elsewhere classified (R26.2);Pain Pain - Right/Left: Left Pain - part of body: Knee    Time: 2505-3976 PT Time Calculation (min) (ACUTE ONLY): 17 min   Charges:   PT Evaluation $PT Eval Low Complexity: 1 Low     PT G Codes:          Philomena Doheny 04/08/2017, 3:55 PM [phone]

## 2017-04-08 NOTE — Op Note (Signed)
OPERATIVE REPORT-TOTAL KNEE ARTHROPLASTY   Pre-operative diagnosis- Osteoarthritis  Left knee(s)  Post-operative diagnosis- Osteoarthritis Left knee(s)  Procedure-  Left  Total Knee Arthroplasty (Depuy Attune)  Surgeon- Dione Plover. Yavonne Kiss, MD  Assistant- Ardeen Jourdain, [place]   Anesthesia-  adductor canal block and spinal  EBL-25 mL   Drains Hemovac  Tourniquet time-  Total Tourniquet Time Documented: Thigh (Left) - 31 minutes Total: Thigh (Left) - 31 minutes     Complications- None  Condition-PACU - hemodynamically stable.   Brief Clinical Note  Julie Fischer is a 56 y.o. year old female with end stage OA of her left knee with progressively worsening pain and dysfunction. She has constant pain, with activity and at rest and significant functional deficits with difficulties even with ADLs. She has had extensive non-op management including analgesics, injections of cortisone, and home exercise program, but remains in significant pain with significant dysfunction. Radiographs show bone on bone arthritis medial and patellofemoral. She presents now for left Total Knee Arthroplasty.    Procedure in detail---   The patient is brought into the operating room and positioned supine on the operating table. After successful administration of  adductor canal block and spinal,   a tourniquet is placed high on the  Left thigh(s) and the lower extremity is prepped and draped in the usual sterile fashion. Time out is performed by the operating team and then the  Left lower extremity is wrapped in Esmarch, knee flexed and the tourniquet inflated to 300 mmHg.       A midline incision is made with a ten blade through the subcutaneous tissue to the level of the extensor mechanism. A fresh blade is used to make a medial parapatellar arthrotomy. Soft tissue over the proximal medial tibia is subperiosteally elevated to the joint line with a knife and into the semimembranosus bursa with a Cobb  elevator. Soft tissue over the proximal lateral tibia is elevated with attention being paid to avoiding the patellar tendon on the tibial tubercle. The patella is everted, knee flexed 90 degrees and the ACL and PCL are removed. Findings are bone on bone medial and patellofemoral with large global osteophytes.        The drill is used to create a starting hole in the distal femur and the canal is thoroughly irrigated with sterile saline to remove the fatty contents. The 5 degree Left  valgus alignment guide is placed into the femoral canal and the distal femoral cutting block is pinned to remove 9 mm off the distal femur. Resection is made with an oscillating saw.      The tibia is subluxed forward and the menisci are removed. The extramedullary alignment guide is placed referencing proximally at the medial aspect of the tibial tubercle and distally along the second metatarsal axis and tibial crest. The block is pinned to remove 12mm off the more deficient medial  side. Resection is made with an oscillating saw. Size 3is the most appropriate size for the tibia and the proximal tibia is prepared with the modular drill and keel punch for that size.      The femoral sizing guide is placed and size 3 is most appropriate. Rotation is marked off the epicondylar axis and confirmed by creating a rectangular flexion gap at 90 degrees. The size 3 cutting block is pinned in this rotation and the anterior, posterior and chamfer cuts are made with the oscillating saw. The intercondylar block is then placed and that cut is made.  Trial size 3 tibial component, trial size 3 posterior stabilized femur and a 8  mm posterior stabilized rotating platform insert trial is placed. Full extension is achieved with excellent varus/valgus and anterior/posterior balance throughout full range of motion. The patella is everted and thickness measured to be 21  mm. Free hand resection is taken to 12 mm, a 32 template is placed, lug holes  are drilled, trial patella is placed, and it tracks normally. Osteophytes are removed off the posterior femur with the trial in place. All trials are removed and the cut bone surfaces prepared with pulsatile lavage. Cement is mixed and once ready for implantation, the size 3 tibial implant, size  3 posterior stabilized femoral component, and the size 32 patella are cemented in place and the patella is held with the clamp. The trial insert is placed and the knee held in full extension. The Exparel (20 ml mixed with 60 ml saline) is injected into the extensor mechanism, posterior capsule, medial and lateral gutters and subcutaneous tissues.  All extruded cement is removed and once the cement is hard the permanent 8 mm posterior stabilized rotating platform insert is placed into the tibial tray.      The wound is copiously irrigated with saline solution and the extensor mechanism closed over a hemovac drain with #1 V-loc suture. The tourniquet is released for a total tourniquet time of 31  minutes. Flexion against gravity is 140 degrees and the patella tracks normally. Subcutaneous tissue is closed with 2.0 vicryl and subcuticular with running 4.0 Monocryl. The incision is cleaned and dried and steri-strips and a bulky sterile dressing are applied. The limb is placed into a knee immobilizer and the patient is awakened and transported to recovery in stable condition.      Please note that a surgical assistant was a medical necessity for this procedure in order to perform it in a safe and expeditious manner. Surgical assistant was necessary to retract the ligaments and vital neurovascular structures to prevent injury to them and also necessary for proper positioning of the limb to allow for anatomic placement of the prosthesis.   Dione Plover Oluwateniola Leitch, MD    04/08/2017, 8:13 AM

## 2017-04-08 NOTE — Anesthesia Procedure Notes (Signed)
Anesthesia Regional Block: Adductor canal block   Pre-Anesthetic Checklist: ,,, Correct Patient, Correct Site, Correct Laterality, Correct Procedure, Correct Position, site marked, risks and benefits discussed, pre-op evaluation, at surgeon's request and post-op pain management  Laterality: Left  Prep: chloraprep       Needles:   Needle Type: Echogenic Needle     Needle Length: 9cm  Needle Gauge: 21     Additional Needles:   Procedures:,,,, ultrasound used (permanent image in chart),,,,  Narrative:  Start time: 04/08/2017 6:52 AM End time: 04/08/2017 7:03 AM Injection made incrementally with aspirations every 5 mL. Anesthesiologist: Lyndle Herrlich, MD

## 2017-04-09 LAB — CBC
HCT: 29.1 % — ABNORMAL LOW (ref 36.0–46.0)
HEMOGLOBIN: 9.7 g/dL — AB (ref 12.0–15.0)
MCH: 29.1 pg (ref 26.0–34.0)
MCHC: 33.3 g/dL (ref 30.0–36.0)
MCV: 87.4 fL (ref 78.0–100.0)
Platelets: 200 10*3/uL (ref 150–400)
RBC: 3.33 MIL/uL — AB (ref 3.87–5.11)
RDW: 13.6 % (ref 11.5–15.5)
WBC: 11.2 10*3/uL — ABNORMAL HIGH (ref 4.0–10.5)

## 2017-04-09 LAB — BASIC METABOLIC PANEL
ANION GAP: 6 (ref 5–15)
BUN: 11 mg/dL (ref 6–20)
CHLORIDE: 107 mmol/L (ref 101–111)
CO2: 26 mmol/L (ref 22–32)
Calcium: 8.8 mg/dL — ABNORMAL LOW (ref 8.9–10.3)
Creatinine, Ser: 0.49 mg/dL (ref 0.44–1.00)
GFR calc Af Amer: >60 mL/min (ref >60)
GFR calc non Af Amer: >60 mL/min (ref >60)
GLUCOSE: 187 mg/dL — AB (ref 65–99)
POTASSIUM: 4.4 mmol/L (ref 3.5–5.1)
SODIUM: 139 mmol/L (ref 135–145)

## 2017-04-09 LAB — GLUCOSE, CAPILLARY
GLUCOSE-CAPILLARY: 239 mg/dL — AB (ref 65–99)
Glucose-Capillary: 142 mg/dL — ABNORMAL HIGH (ref 65–99)
Glucose-Capillary: 188 mg/dL — ABNORMAL HIGH (ref 65–99)
Glucose-Capillary: 267 mg/dL — ABNORMAL HIGH (ref 65–99)

## 2017-04-09 MED ORDER — METHOCARBAMOL 500 MG PO TABS: 500.0000 mg | ORAL_TABLET | Freq: Four times a day (QID) | ORAL | 0 refills | Status: DC | PRN

## 2017-04-09 MED ORDER — RIVAROXABAN 10 MG PO TABS: 10.0000 mg | ORAL_TABLET | Freq: Every day | ORAL | 0 refills | Status: DC

## 2017-04-09 MED ORDER — OXYCODONE HCL 5 MG PO TABS: 5.0000 mg | ORAL_TABLET | ORAL | 0 refills | Status: DC | PRN

## 2017-04-09 NOTE — Progress Notes (Signed)
Spoke with patient at bedside. Confirmed plan for OP PT, already arranged. Has RW but needs 3n1, also requesting cane. Contacted [facility] to deliver to room. [phone]

## 2017-04-09 NOTE — Evaluation (Signed)
Occupational Therapy Evaluation Patient Details Name: Julie Fischer MRN: [id number] DOB: [birth year] Today's Date: 04/09/2017    History of Present Illness L TKA   Clinical Impression   Pt was independent prior to admission. Presents with increased L knee pain and impaired standing balance interfering with ability to perform ADL at her baseline. Educated pt and husband in fall prevention, safe footwear, how to transport items safely with RW, tub transfer, multiple uses of 3 in 1, and use of AE for LB bathing and dressing. Pt verbalizing understanding of all information. No further OT needs.    Follow Up Recommendations  No OT follow up    Equipment Recommendations  (Youth RW)    Recommendations for Other Services       Precautions / Restrictions Precautions Precautions: Fall;Knee Precaution Comments: reviewed no pillow under knee Restrictions Weight Bearing Restrictions: No      Mobility Bed Mobility               General bed mobility comments: pt in chair  Transfers Overall transfer level: Needs assistance Equipment used: Rolling walker (2 wheeled) Transfers: Sit to/from Stand Sit to Stand: Min guard         General transfer comment: min guard for safety, increased time    Balance                                           ADL either performed or assessed with clinical judgement   ADL Overall ADL's : Needs assistance/impaired Eating/Feeding: Independent;Sitting   Grooming: Wash/dry hands;Standing;Min guard   Upper Body Bathing: Set up;Sitting   Lower Body Bathing: Minimal assistance;Sit to/from stand   Upper Body Dressing : Set up;Sitting   Lower Body Dressing: Minimal assistance;Sit to/from stand   Toilet Transfer: Min guard;Ambulation;RW   Toileting- Water quality scientist and Hygiene: Min guard;Sit to/from Nurse, children's Details (indicate cue type and reason): educated in use of 3 in 1 as shower seat and  transfer Functional mobility during ADLs: Min guard;Rolling walker General ADL Comments: Educated in use of AE, compensatory strategies for LB ADL, multiple uses of 3 in 1, safe footwear and how to transport items safely with RW.      Vision         Perception     Praxis      Pertinent Vitals/Pain Pain Assessment: 0-10 Pain Score: 9  Pain Location: L knee Pain Descriptors / Indicators: Sore Pain Intervention(s): Monitored during session;Repositioned;Ice applied     Hand Dominance Right   Extremity/Trunk Assessment Upper Extremity Assessment Upper Extremity Assessment: Overall WFL for tasks assessed   Lower Extremity Assessment Lower Extremity Assessment: Defer to PT evaluation   Cervical / Trunk Assessment Cervical / Trunk Assessment: Normal   Communication Communication Communication: No difficulties   Cognition Arousal/Alertness: Awake/alert Behavior During Therapy: WFL for tasks assessed/performed Overall Cognitive Status: Within Functional Limits for tasks assessed                                     General Comments       Exercises     Shoulder Instructions      Home Living Family/patient expects to be discharged to:: Private residence Living Arrangements: Spouse/significant other Available Help at Discharge: Family;Available 24 hours/day Type of Home:  House Home Access: Level entry     Home Layout: Two level;Bed/bath upstairs Alternate Level Stairs-Number of Steps: 12 Alternate Level Stairs-Rails: Right Bathroom Shower/Tub: Teacher, early years/pre: Standard     Home Equipment: Walker - 4 wheels;Crutches          Prior Functioning/Environment Level of Independence: Independent                 OT Problem List: Impaired balance (sitting and/or standing);Decreased activity tolerance;Pain;Decreased knowledge of use of DME or AE      OT Treatment/Interventions:      OT Goals(Current goals can be found in the  care plan section) Acute Rehab OT Goals Patient Stated Goal: to walk  OT Frequency:     Barriers to D/C:            Co-evaluation              AM-PAC PT "6 Clicks" Daily Activity     Outcome Measure Help from another person eating meals?: None Help from another person taking care of personal grooming?: A Little Help from another person toileting, which includes using toliet, bedpan, or urinal?: A Little Help from another person bathing (including washing, rinsing, drying)?: A Little Help from another person to put on and taking off regular upper body clothing?: None Help from another person to put on and taking off regular lower body clothing?: A Little 6 Click Score: 20   End of Session Equipment Utilized During Treatment: Rolling walker;Gait belt Nurse Communication: Other (comment)(needs order for youth RW)  Activity Tolerance: Patient tolerated treatment well Patient left: in chair;with call bell/phone within reach;with family/visitor present  OT Visit Diagnosis: Pain;Other abnormalities of gait and mobility (R26.89)                Time: 1127-1200 OT Time Calculation (min): 33 min Charges:  OT General Charges $OT Visit: 1 Visit OT Evaluation $OT Eval Moderate Complexity: 1 Mod OT Treatments $Self Care/Home Management : 8-22 mins G-Codes:     2017-05-03 Nestor Lewandowsky, OTR/L Pager: [phone]  Werner Lean, Haze Boyden 05-03-2017, 12:07 PM

## 2017-04-09 NOTE — Discharge Summary (Signed)
Physician Discharge Summary   Patient ID: Julie Fischer MRN: [id number] DOB/AGE: Nov 08, 1960 56 y.o.  Admit date: 04/08/2017 Discharge date: 04/10/2017  Primary Diagnosis:  Osteoarthritis  Left knee(s)  Admission Diagnoses:  Past Medical History:  Diagnosis Date  . Allergy    fall seasonal  . Anxiety   . Blood transfusion    Platelet transfusion when on heparin  . Breast cancer ([place]) 2001   R breast(lumpectomy,chemo,radiation,tamoxifen)  . C. difficile diarrhea 02/2016   treated with Flagyl  . Complication of anesthesia    gets Jillyn Ledger going under she has been told  . Depression    treated  . Diabetes mellitus    type 2  . GERD (gastroesophageal reflux disease)   . Hyperlipidemia   . Hypertension    resolved after weight loss surgery; recurred 2013  . Insomnia chronic  . Iron deficiency    h/o  . Microalbuminuria    h/o  . OA (osteoarthritis) of knee   . Osteoporosis   . Plantar fasciitis (07') [facility]   s/p surgical release 01/2011  . Sleep apnea    resolved after weight loss surgery; recurrent with weight gain; on CPAP  . Vitamin D deficiency    Discharge Diagnoses:   Principal Problem:   OA (osteoarthritis) of knee  Estimated body mass index is 33.11 kg/m as calculated from the following:   Height as of this encounter: _0  (1.448 m).   Weight as of this encounter: 69.4 kg (153 lb).  Procedure:  Procedure(s) (LRB): LEFT TOTAL KNEE ARTHROPLASTY (Left)   Consults: None  HPI: Julie Fischer is a 56 y.o. year old female with end stage OA of her left knee with progressively worsening pain and dysfunction. She has constant pain, with activity and at rest and significant functional deficits with difficulties even with ADLs. She has had extensive non-op management including analgesics, injections of cortisone, and home exercise program, but remains in significant pain with significant dysfunction. Radiographs show bone on bone arthritis medial and  patellofemoral. She presents now for left Total Knee Arthroplasty.     Laboratory Data: Admission on 04/08/2017  Component Date Value Ref Range Status  . Glucose-Capillary 04/08/2017 162* 65 - 99 mg/dL Final  . Glucose-Capillary 04/08/2017 143* 65 - 99 mg/dL Final  . Glucose-Capillary 04/08/2017 274* 65 - 99 mg/dL Final  . WBC 04/09/2017 11.2* 4.0 - 10.5 K/uL Final  . RBC 04/09/2017 3.33* 3.87 - 5.11 MIL/uL Final  . Hemoglobin 04/09/2017 9.7* 12.0 - 15.0 g/dL Final  . HCT 04/09/2017 29.1* 36.0 - 46.0 % Final  . MCV 04/09/2017 87.4  78.0 - 100.0 fL Final  . MCH 04/09/2017 29.1  26.0 - 34.0 pg Final  . MCHC 04/09/2017 33.3  30.0 - 36.0 g/dL Final  . RDW 04/09/2017 13.6  11.5 - 15.5 % Final  . Platelets 04/09/2017 200  150 - 400 K/uL Final  . Sodium 04/09/2017 139  135 - 145 mmol/L Final  . Potassium 04/09/2017 4.4  3.5 - 5.1 mmol/L Final  . Chloride 04/09/2017 107  101 - 111 mmol/L Final  . CO2 04/09/2017 26  22 - 32 mmol/L Final  . Glucose, Bld 04/09/2017 187* 65 - 99 mg/dL Final  . BUN 04/09/2017 11  6 - 20 mg/dL Final  . Creatinine, Ser 04/09/2017 0.49  0.44 - 1.00 mg/dL Final  . Calcium 04/09/2017 8.8* 8.9 - 10.3 mg/dL Final  . GFR calc non Af Amer 04/09/2017 >60  >60 mL/min Final  .  GFR calc Af Amer 04/09/2017 >60  >60 mL/min Final   Comment: (NOTE) The eGFR has been calculated using the CKD EPI equation. This calculation has not been validated in all clinical situations. eGFR's persistently <60 mL/min signify possible Chronic Kidney Disease.   . Anion gap 04/09/2017 6  5 - 15 Final  . Glucose-Capillary 04/08/2017 252* 65 - 99 mg/dL Final  . Glucose-Capillary 04/08/2017 324* 65 - 99 mg/dL Final  . Glucose-Capillary 04/09/2017 142* 65 - 99 mg/dL Final  . Glucose-Capillary 04/09/2017 188* 65 - 99 mg/dL Final  . Glucose-Capillary 04/09/2017 267* 65 - 99 mg/dL Final  . Glucose-Capillary 04/09/2017 239* 65 - 99 mg/dL Final  Hospital Outpatient Visit on 04/03/2017  Component  Date Value Ref Range Status  . aPTT 04/03/2017 28  24 - 36 seconds Final  . Prothrombin Time 04/03/2017 12.7  11.4 - 15.2 seconds Final  . INR 04/03/2017 0.96   Final  . ABO/RH(D) 04/03/2017 O POS   Final  . Antibody Screen 04/03/2017 NEG   Final  . Sample Expiration 04/03/2017 04/11/2017   Final  . Extend sample reason 04/03/2017 NO TRANSFUSIONS OR PREGNANCY IN THE PAST 3 MONTHS   Final  . MRSA, PCR 04/03/2017 NEGATIVE  NEGATIVE Final  . Staphylococcus aureus 04/03/2017 POSITIVE* NEGATIVE Final   Comment: (NOTE) The Xpert SA Assay (FDA approved for NASAL specimens in patients 71 years of age and older), is one component of a comprehensive surveillance program. It is not intended to diagnose infection nor to guide or monitor treatment.   . Glucose-Capillary 04/03/2017 170* 65 - 99 mg/dL Final  . ABO/RH(D) 04/03/2017 O POS   Final  Office Visit on 03/21/2017  Component Date Value Ref Range Status  . Color, UA 03/21/2017 yellow  yellow Final  . Clarity, UA 03/21/2017 clear  clear Final  . Glucose, UA 03/21/2017 =500* negative mg/dL Final  . Bilirubin, UA 03/21/2017 negative  negative Final  . Ketones, POC UA 03/21/2017 negative  negative mg/dL Final  . Specific Gravity, Urine 03/21/2017 1.020   Final  . Blood, UA 03/21/2017 negative  negative Final  . pH, UA 03/21/2017 6.0  5.0 - 8.0 Final  . Protein Ur, POC 03/21/2017 negative  negative mg/dL Final  . Urobilinogen, Ur 03/21/2017 neg   Final  . Nitrite, UA 03/21/2017 Negative  Negative Final  . Leukocytes, UA 03/21/2017 Negative  Negative Final  . POC Glucose 03/21/2017 182* 70 - 99 mg/dl Final  Appointment on 03/18/2017  Component Date Value Ref Range Status  . Hgb A1c MFr Bld 03/18/2017 7.5* <5.7 % of total Hgb Final   Comment: For someone without known diabetes, a hemoglobin A1c value of 6.5% or greater indicates that they may have  diabetes and this should be confirmed with a follow-up  test. . For someone with known  diabetes, a value <7% indicates  that their diabetes is well controlled and a value  greater than or equal to 7% indicates suboptimal  control. A1c targets should be individualized based on  duration of diabetes, age, comorbid conditions, and  other considerations. . Currently, no consensus exists regarding use of hemoglobin A1c for diagnosis of diabetes for children. .   . Mean Plasma Glucose 03/18/2017 169  (calc) Final  . eAG (mmol/L) 03/18/2017 9.3  (calc) Final  . WBC 03/18/2017 6.2  3.8 - 10.8 Thousand/uL Final  . RBC 03/18/2017 4.35  3.80 - 5.10 Million/uL Final  . Hemoglobin 03/18/2017 12.7  11.7 - 15.5 g/dL Final  .  HCT 03/18/2017 37.1  35.0 - 45.0 % Final  . MCV 03/18/2017 85.3  80.0 - 100.0 fL Final  . MCH 03/18/2017 29.2  27.0 - 33.0 pg Final  . MCHC 03/18/2017 34.2  32.0 - 36.0 g/dL Final  . RDW 03/18/2017 12.6  11.0 - 15.0 % Final  . Platelets 03/18/2017 252  140 - 400 Thousand/uL Final  . MPV 03/18/2017 10.2  7.5 - 12.5 fL Final  . Neutro Abs 03/18/2017 3,199  1,500 - 7,800 cells/uL Final  . Lymphs Abs 03/18/2017 2,226  850 - 3,900 cells/uL Final  . WBC mixed population 03/18/2017 533  200 - 950 cells/uL Final  . Eosinophils Absolute 03/18/2017 211  15 - 500 cells/uL Final  . Basophils Absolute 03/18/2017 31  0 - 200 cells/uL Final  . Neutrophils Relative % 03/18/2017 51.6  % Final  . Total Lymphocyte 03/18/2017 35.9  % Final  . Monocytes Relative 03/18/2017 8.6  % Final  . Eosinophils Relative 03/18/2017 3.4  % Final  . Basophils Relative 03/18/2017 0.5  % Final  . Cholesterol 03/18/2017 150  <200 mg/dL Final  . HDL 03/18/2017 44* >50 mg/dL Final  . Triglycerides 03/18/2017 163* <150 mg/dL Final  . LDL Cholesterol (Calc) 03/18/2017 79  mg/dL (calc) Final   Comment: Reference range: <100 . Desirable range <100 mg/dL for primary prevention;   <70 mg/dL for patients with CHD or diabetic patients  with > or = 2 CHD risk factors. Marland Kitchen LDL-C is now calculated using  the Martin-Hopkins  calculation, which is a validated novel method providing  better accuracy than the Friedewald equation in the  estimation of LDL-C.  Cresenciano Genre et al. Annamaria Helling. 2637;858(85): 2061-2068  (http://education.[facility].com/faq/FAQ164)   . Total CHOL/HDL Ratio 03/18/2017 3.4  <5.0 (calc) Final  . Non-HDL Cholesterol (Calc) 03/18/2017 106  <130 mg/dL (calc) Final   Comment: For patients with diabetes plus 1 major ASCVD risk  factor, treating to a non-HDL-C goal of <100 mg/dL  (LDL-C of <70 mg/dL) is considered a therapeutic  option.   . Vit D, 25-Hydroxy 03/18/2017 53  30 - 100 ng/mL Final   Comment: Vitamin D Status         25-OH Vitamin D: . Deficiency:                    <20 ng/mL Insufficiency:             20 - 29 ng/mL Optimal:                 > or = 30 ng/mL . For 25-OH Vitamin D testing on patients on  D2-supplementation and patients for whom quantitation  of D2 and D3 fractions is required, the QuestAssureD(TM) 25-OH VIT D, (D2,D3), LC/MS/MS is recommended: order  code [phone] (patients >2yr). . For more information on this test, go to: http://education.[facility].com/faq/FAQ163 (This link is being provided for  informational/educational purposes only.)   . TSH 03/18/2017 1.14  0.40 - 4.50 mIU/L Final  . Glucose, Bld 03/18/2017 180* 65 - 99 mg/dL Final   Comment: .            Fasting reference interval . For someone without known diabetes, a glucose value >125 mg/dL indicates that they may have diabetes and this should be confirmed with a follow-up test. .   . BUN 03/18/2017 14  7 - 25 mg/dL Final  . Creat 03/18/2017 0.55  0.50 - 1.05 mg/dL Final   Comment: For patients >49 years  of age, the reference limit for Creatinine is approximately 13% higher for people identified as African-American. .   Havery Moros Ratio 72/01/4708 NOT APPLICABLE  6 - 22 (calc) Final  . Sodium 03/18/2017 136  135 - 146 mmol/L Final  . Potassium 03/18/2017  4.7  3.5 - 5.3 mmol/L Final  . Chloride 03/18/2017 100  98 - 110 mmol/L Final  . CO2 03/18/2017 23  20 - 32 mmol/L Final  . Calcium 03/18/2017 9.8  8.6 - 10.4 mg/dL Final  . Total Protein 03/18/2017 6.7  6.1 - 8.1 g/dL Final  . Albumin 03/18/2017 4.5  3.6 - 5.1 g/dL Final  . Globulin 03/18/2017 2.2  1.9 - 3.7 g/dL (calc) Final  . AG Ratio 03/18/2017 2.0  1.0 - 2.5 (calc) Final  . Total Bilirubin 03/18/2017 0.4  0.2 - 1.2 mg/dL Final  . Alkaline phosphatase (APISO) 03/18/2017 51  33 - 130 U/L Final  . AST 03/18/2017 25  10 - 35 U/L Final  . ALT 03/18/2017 31* 6 - 29 U/L Final  . Creatinine, Urine 03/18/2017 145  20 - 275 mg/dL Final  . Microalb, Ur 03/18/2017 3.2  mg/dL Final   Comment: Reference Range Not established   . Microalb Creat Ratio 03/18/2017 22  <30 mcg/mg creat Final   Comment: . The ADA defines abnormalities in albumin excretion as follows: Marland Kitchen Category         Result (mcg/mg creatinine) . Normal                    <30 Microalbuminuria         30-299  Clinical albuminuria   > OR = 300 . The ADA recommends that at least two of three specimens collected within a 3-6 month period be abnormal before considering a patient to be within a diagnostic category.      X-Rays:No results found.  EKG: Orders placed or performed during the hospital encounter of 04/03/17  . EKG 12 lead  . EKG 12 lead     Hospital Course: ANIYLAH AVANS is a 56 y.o. who was [facility]. They were brought to the operating room on 04/08/2017 and underwent Procedure(s): LEFT TOTAL KNEE ARTHROPLASTY.  Patient tolerated the procedure well and was later transferred to the recovery room and then to the orthopaedic floor for postoperative care.  They were given PO and IV analgesics for pain control following their surgery.  They were given 24 hours of postoperative antibiotics of  Anti-infectives (From admission, onward)   Start     Dose/Rate Route Frequency Ordered Stop    04/08/17 1900  vancomycin (VANCOCIN) IVPB 1000 mg/200 mL premix     1,000 mg 200 mL/hr over 60 Minutes Intravenous  Once 04/08/17 0817 04/08/17 1913   04/08/17 0556  vancomycin (VANCOCIN) 1-5 GM/200ML-% IVPB    Comments:  Harvell, Gwendolyn  : cabinet override      04/08/17 0556 04/08/17 0641   04/08/17 0529  vancomycin (VANCOCIN) IVPB 1000 mg/200 mL premix     1,000 mg 200 mL/hr over 60 Minutes Intravenous On call to O.R. 04/08/17 6283 04/08/17 0741     and started on DVT prophylaxis in the form of Xarelto.   PT and OT were ordered for total joint protocol.  Discharge planning consulted to help with postop disposition and equipment needs.  Patient had a good night on the evening of surgery and walked 40 feet.  They started to get up OOB with  therapy on day one. Hemovac drain was pulled without difficulty.  Continued to work with therapy into day two.  Dressing was changed on day two and the incision was healing well. Patient was seen in rounds on day two and was ready to go home.  Diet - Cardiac diet and Diabetic diet Follow up - in 2 weeks Activity - WBAT Disposition - Home Condition Upon Discharge - Good D/C Meds - See DC Summary DVT Prophylaxis - Xarelto     Discharge Instructions    Call MD / Call 911   Complete by:  As directed    If you experience chest pain or shortness of breath, CALL 911 and be transported to the hospital emergency room.  If you develope a fever above 101 F, pus (white drainage) or increased drainage or redness at the wound, or calf pain, call your surgeon's office.   Change dressing   Complete by:  As directed    Change dressing daily with sterile 4 x 4 inch gauze dressing and apply TED hose. Do not submerge the incision under water.   Constipation Prevention   Complete by:  As directed    Drink plenty of fluids.  Prune juice may be helpful.  You may use a stool softener, such as Colace (over the counter) 100 mg twice a day.  Use MiraLax (over the counter)  for constipation as needed.   Diet Carb Modified   Complete by:  As directed    Discharge instructions   Complete by:  As directed    Take Xarelto for two and a half more weeks, then discontinue Xarelto. Once the patient has completed the blood thinner regimen, then take a Baby 81 mg Aspirin daily for three more weeks.   Pick up stool softner and laxative for home use following surgery while on pain medications. Do not submerge incision under water. Please use good hand washing techniques while changing dressing each day. May shower starting three days after surgery. Please use a clean towel to pat the incision dry following showers. Continue to use ice for pain and swelling after surgery. Do not use any lotions or creams on the incision until instructed by your surgeon.  Wear both TED hose on both legs during the day every day for three weeks, but may remove the TED hose at night at home.  Postoperative Constipation Protocol  Constipation - defined medically as fewer than three stools per week and severe constipation as less than one stool per week.  One of the most common issues patients have following surgery is constipation.  Even if you have a regular bowel pattern at home, your normal regimen is likely to be disrupted due to multiple reasons following surgery.  Combination of anesthesia, postoperative narcotics, change in appetite and fluid intake all can affect your bowels.  In order to avoid complications following surgery, here are some recommendations in order to help you during your recovery period.  Colace (docusate) - Pick up an over-the-counter form of Colace or another stool softener and take twice a day as long as you are requiring postoperative pain medications.  Take with a full glass of water daily.  If you experience loose stools or diarrhea, hold the colace until you stool forms back up.  If your symptoms do not get better within 1 week or if they get worse, check with  your doctor.  Dulcolax (bisacodyl) - Pick up over-the-counter and take as directed by the product packaging as needed to  assist with the movement of your bowels.  Take with a full glass of water.  Use this product as needed if not relieved by Colace only.   MiraLax (polyethylene glycol) - Pick up over-the-counter to have on hand.  MiraLax is a solution that will increase the amount of water in your bowels to assist with bowel movements.  Take as directed and can mix with a glass of water, juice, soda, coffee, or tea.  Take if you go more than two days without a movement. Do not use MiraLax more than once per day. Call your doctor if you are still constipated or irregular after using this medication for 7 days in a row.  If you continue to have problems with postoperative constipation, please contact the office for further assistance and recommendations.  If you experience "the worst abdominal pain ever" or develop nausea or vomiting, please contact the office immediatly for further recommendations for treatment.   Do not put a pillow under the knee. Place it under the heel.   Complete by:  As directed    Do not sit on low chairs, stoools or toilet seats, as it may be difficult to get up from low surfaces   Complete by:  As directed    Driving restrictions   Complete by:  As directed    No driving until released by the physician.   Increase activity slowly as tolerated   Complete by:  As directed    Lifting restrictions   Complete by:  As directed    No lifting until released by the physician.   Patient may shower   Complete by:  As directed    You may shower without a dressing once there is no drainage.  Do not wash over the wound.  If drainage remains, do not shower until drainage stops.   TED hose   Complete by:  As directed    Use stockings (TED hose) for 3 weeks on both leg(s).  You may remove them at night for sleeping.   Weight bearing as tolerated   Complete by:  As directed     Laterality:  left   Extremity:  Lower     Allergies as of 04/09/2017      Reactions   Heparin Other (See Comments)   Low platelets   Niacin And Related Other (See Comments)   Blotchy/itchy   Tessalon [benzonatate] Diarrhea   Tramadol Other (See Comments)   Dizziness (lasted 24 hours after taking 2 doses)   Penicillins Rash, Other (See Comments)   Has patient had a PCN reaction causing immediate rash, facial/tongue/throat swelling, SOB or lightheadedness with hypotension: Unknown Has patient had a PCN reaction causing severe rash involving mucus membranes or skin necrosis: Unknown Has patient had a PCN reaction that required hospitalization: No Has patient had a PCN reaction occurring within the last 10 years: No If all of the above answers are "NO", then may proceed with Cephalosporin use.      Medication List    STOP taking these medications   alendronate 70 MG tablet Commonly known as:  FOSAMAX   CALTRATE 600+D 600-400 MG-UNIT tablet Generic drug:  Calcium Carbonate-Vitamin D   CHILDRENS MULTIVITAMIN PO   Fish Oil 1200 MG Caps   HAIR/SKIN/NAILS PO   HYDROcodone-acetaminophen 5-325 MG tablet Commonly known as:  NORCO/VICODIN   Lysine 1000 MG Tabs   naproxen sodium 220 MG tablet Commonly known as:  ALEVE     TAKE these medications  acetaminophen 500 MG tablet Commonly known as:  TYLENOL Take 1,500 mg every 6 (six) hours as needed by mouth for moderate pain or headache.   cetirizine 10 MG tablet Commonly known as:  ZYRTEC Take 10 mg daily as needed by mouth for allergies.   hydrOXYzine 25 MG tablet Commonly known as:  ATARAX/VISTARIL Take 1 tablet (25 mg total) by mouth 3 (three) times daily as needed for itching.   lisinopril 10 MG tablet Commonly known as:  PRINIVIL,ZESTRIL Take 1 tablet (10 mg total) by mouth daily.   metFORMIN 500 MG 24 hr tablet Commonly known as:  GLUCOPHAGE-XR Take  2 tablets daily What changed:    how much to take  how to  take this  when to take this  additional instructions   methocarbamol 500 MG tablet Commonly known as:  ROBAXIN Take 1 tablet (500 mg total) by mouth every 6 (six) hours as needed for muscle spasms.   omeprazole 20 MG tablet Commonly known as:  PRILOSEC OTC Take 1 capsule up to twice daily for the next 1-2 weeks What changed:    how much to take  how to take this  when to take this  reasons to take this  additional instructions   oxyCODONE 5 MG immediate release tablet Commonly known as:  Oxy IR/ROXICODONE Take 1-2 tablets (5-10 mg total) by mouth every 4 (four) hours as needed for moderate pain or severe pain.   PARoxetine 40 MG tablet Commonly known as:  PAXIL Take 1 tablet (40 mg total) by mouth every morning.   RESTASIS 0.05 % ophthalmic emulsion Generic drug:  cycloSPORINE INSTILL 1 DROP IN EACH EYE 2 TIMES DAILY   rivaroxaban 10 MG Tabs tablet Commonly known as:  XARELTO Take 1 tablet (10 mg total) by mouth daily with breakfast. Take Xarelto for two and a half more weeks following discharge from the hospital, then discontinue Xarelto. Once the patient has completed the blood thinner regimen, then take a Baby 81 mg Aspirin daily for three more weeks. Start taking on:  04/10/2017   simethicone 125 MG chewable tablet Commonly known as:  MYLICON Chew 428 mg every 6 (six) hours as needed by mouth for flatulence.   simvastatin 20 MG tablet Commonly known as:  ZOCOR Take 1 tablet (20 mg total) by mouth at bedtime.   valACYclovir 1000 MG tablet Commonly known as:  VALTREX TAKE 2 TABLETS AT ONSET OF COLD SORE AND REPEAT IN 12 HOURS (4 TABLETS PER EPISODE OF COLD SORE)   zolpidem 12.5 MG CR tablet Commonly known as:  AMBIEN CR Take 1 tablet (12.5 mg total) by mouth at bedtime.            Durable Medical Equipment  (From admission, onward)        Start     Ordered   04/09/17 1106  For home use only DME 3 n 1  Once     04/09/17 1108   04/09/17 1106   For home use only DME Cane  Once     04/09/17 1108       Discharge Care Instructions  (From admission, onward)        Start     Ordered   04/09/17 0000  Weight bearing as tolerated    Question Answer Comment  Laterality left   Extremity Lower      04/09/17 2216   04/09/17 0000  Change dressing    Comments:  Change dressing daily with sterile 4 x  4 inch gauze dressing and apply TED hose. Do not submerge the incision under water.   04/09/17 2216     Follow-up Information    Gaynelle Arabian, MD. Schedule an appointment as soon as possible for a visit on 04/23/2017.   Specialty:  Orthopedic Surgery Contact information: [address] [place] [address] [phone]           Signed: Arlee Muslim, PA-C Orthopaedic Surgery 04/09/2017, 10:18 PM

## 2017-04-09 NOTE — Progress Notes (Signed)
Physical Therapy Treatment Patient Details Name: Julie Fischer MRN: [id number] DOB: May 13, 1961 Today's Date: 04/09/2017    History of Present Illness L TKA    PT Comments    POD 1 PM session, Patient ambulated 35 feet with min guard for safety, reporting a decrease in L knee pain. Required VC's for sequencing with walker. Positioned in bed with ice.   Follow Up Recommendations  OP     Equipment Recommendations  Rolling walker with 5" wheels    Recommendations for Other Services       Precautions / Restrictions Precautions Precautions: Fall;Knee Precaution Comments: reviewed no pillow under knee Required Braces or Orthoses: Knee Immobilizer - Left Knee Immobilizer - Left: Discontinue once straight leg raise with < 10 degree lag Restrictions Weight Bearing Restrictions: No    Mobility  Bed Mobility Overal bed mobility: Needs Assistance Bed Mobility: Supine to Sit;Sit to Supine     Supine to sit: Min guard;Supervision Sit to supine: Min guard;Supervision   General bed mobility comments: required increased time   Transfers Overall transfer level: Needs assistance Equipment used: Rolling walker (2 wheeled) Transfers: Sit to/from Stand Sit to Stand: Min guard         General transfer comment: required increased time, min guard for safety  Ambulation/Gait Ambulation/Gait assistance: Min guard;Min assist Ambulation Distance (Feet): 35 Feet Assistive device: Rolling walker (2 wheeled) Gait Pattern/deviations: Step-to pattern;Decreased step length - right;Decreased step length - left;Decreased stride length   Gait velocity interpretation: Below normal speed for age/gender General Gait Details: 25% VC's for sequencing with RW, min guard for safety   Stairs            Wheelchair Mobility    Modified Rankin (Stroke Patients Only)       Balance                                            Cognition Arousal/Alertness:  Awake/alert Behavior During Therapy: WFL for tasks assessed/performed Overall Cognitive Status: Within Functional Limits for tasks assessed                                        Exercises     General Comments        Pertinent Vitals/Pain Pain Assessment: 0-10 Pain Score: 7  Pain Location: L knee Pain Descriptors / Indicators: Sore;Tightness Pain Intervention(s): Monitored during session;Repositioned;Ice applied    Home Living Family/patient expects to be discharged to:: Private residence Living Arrangements: Spouse/significant other Available Help at Discharge: Family;Available 24 hours/day Type of Home: House Home Access: Level entry   Home Layout: Two level;Bed/bath upstairs Home Equipment: Walker - 4 wheels;Crutches      Prior Function Level of Independence: Independent          PT Goals (current goals can now be found in the care plan section) Acute Rehab PT Goals Patient Stated Goal: to walk    Frequency    7X/week      PT Plan      Co-evaluation              AM-PAC PT "6 Clicks" Daily Activity  Outcome Measure  Difficulty turning over in bed (including adjusting bedclothes, sheets and blankets)?: A Little Difficulty moving from lying on back to sitting on the side  of the bed? : A Little Difficulty sitting down on and standing up from a chair with arms (e.g., wheelchair, bedside commode, etc,.)?: A Little Help needed moving to and from a bed to chair (including a wheelchair)?: A Little Help needed walking in hospital room?: A Lot Help needed climbing 3-5 steps with a railing? : A Lot 6 Click Score: 16    End of Session Equipment Utilized During Treatment: Gait belt;Left knee immobilizer Activity Tolerance: Patient tolerated treatment well Patient left: with call bell/phone within reach;with family/visitor present;in bed Nurse Communication: Mobility status PT Visit Diagnosis: Muscle weakness (generalized)  (M62.81);Difficulty in walking, not elsewhere classified (R26.2);Pain Pain - Right/Left: Left Pain - part of body: Knee     Time: 3903-0092 PT Time Calculation (min) (ACUTE ONLY): 12 min  Charges:  $Gait Training: 8-22 mins                     G Codes:       Almond Lint, SPTA [facility] [place]  PTA [facility]  Acute  Rehab Pager      [phone]

## 2017-04-09 NOTE — Progress Notes (Signed)
Physical Therapy Treatment Patient Details Name: Julie Fischer MRN: [id number] DOB: Mar 05, 1961 Today's Date: 04/09/2017    History of Present Illness L TKA    PT Comments    POD # 1 am session Ambulated 15 feet with min guard, limited by pain in the L knee. Performed exercises to pain tolerance, with patient reporting an increase in pain by the end of session. Ice applied and instructed pt and caregiver on KI use.   Follow Up Recommendations  DC plan and follow up therapy as arranged by surgeon     Equipment Recommendations  Rolling walker with 5" wheels    Recommendations for Other Services       Precautions / Restrictions Precautions Precautions: Fall;Knee Precaution Comments: reviewed no pillow under knee Required Braces or Orthoses: Knee Immobilizer - Left Knee Immobilizer - Left: Discontinue once straight leg raise with < 10 degree lag Restrictions Weight Bearing Restrictions: No    Mobility  Bed Mobility               General bed mobility comments: pt sitting at EOB  Transfers Overall transfer level: Needs assistance Equipment used: Rolling walker (2 wheeled) Transfers: Sit to/from Stand Sit to Stand: Min guard         General transfer comment: required increased time, min guard for safety  Ambulation/Gait Ambulation/Gait assistance: Min guard;Min assist Ambulation Distance (Feet): 15 Feet Assistive device: Rolling walker (2 wheeled) Gait Pattern/deviations: Step-to pattern;Decreased step length - right;Decreased step length - left;Decreased stride length   Gait velocity interpretation: Below normal speed for age/gender General Gait Details: 25% VC's for sequencing with RW.    Stairs            Wheelchair Mobility    Modified Rankin (Stroke Patients Only)       Balance                                            Cognition Arousal/Alertness: Awake/alert Behavior During Therapy: WFL for tasks  assessed/performed Overall Cognitive Status: Within Functional Limits for tasks assessed                                        Exercises General Exercises - Lower Extremity Ankle Circles/Pumps: AROM;Both;10 reps;Seated Quad Sets: AROM;Left;10 reps;Seated Short Arc Quad: Left;Seated;AROM;10 reps Heel Slides: AAROM;Left;Supine;10 reps Hip ABduction/ADduction: 10 reps;AAROM;Left;Supine Straight Leg Raises: AAROM;Supine;Left;10 reps    General Comments        Pertinent Vitals/Pain Pain Assessment: 0-10 Pain Score: 10-Worst pain ever Pain Location: L knee Pain Descriptors / Indicators: Sore;Tightness Pain Intervention(s): Monitored during session;Repositioned;Ice applied    Home Living Family/patient expects to be discharged to:: Private residence Living Arrangements: Spouse/significant other Available Help at Discharge: Family;Available 24 hours/day Type of Home: House Home Access: Level entry   Home Layout: Two level;Bed/bath upstairs Home Equipment: Walker - 4 wheels;Crutches      Prior Function Level of Independence: Independent          PT Goals (current goals can now be found in the care plan section) Acute Rehab PT Goals Patient Stated Goal: to walk    Frequency    7X/week      PT Plan      Co-evaluation  AM-PAC PT "6 Clicks" Daily Activity  Outcome Measure  Difficulty turning over in bed (including adjusting bedclothes, sheets and blankets)?: A Little Difficulty moving from lying on back to sitting on the side of the bed? : A Little Difficulty sitting down on and standing up from a chair with arms (e.g., wheelchair, bedside commode, etc,.)?: A Little Help needed moving to and from a bed to chair (including a wheelchair)?: A Little Help needed walking in hospital room?: A Lot Help needed climbing 3-5 steps with a railing? : A Lot 6 Click Score: 16    End of Session Equipment Utilized During Treatment: Gait  belt;Left knee immobilizer Activity Tolerance: Patient tolerated treatment well Patient left: in chair;with call bell/phone within reach;with family/visitor present Nurse Communication: Mobility status PT Visit Diagnosis: Muscle weakness (generalized) (M62.81);Difficulty in walking, not elsewhere classified (R26.2);Pain Pain - Right/Left: Left Pain - part of body: Knee     Time:  - 11:00 - 11:25    Charges:    1 gt    1 te                   G Codes:       Almond Lint, SPTA [facility] [phone]  Rica Koyanagi  PTA [facility]  Acute  Rehab Pager      [phone]

## 2017-04-09 NOTE — Progress Notes (Signed)
   Subjective: 1 Day Post-Op Procedure(s) (LRB): LEFT TOTAL KNEE ARTHROPLASTY (Left) Patient reports pain as mild.   Patient seen in rounds for Dr. Wynelle Link.  She was seen earlier today on morning rounds.  At that time she was doing great.  She had been sitting up eating breakfast during rounds. Patient is well, but has had some minor complaints of pain in the knees, requiring pain medications She started therapy today and walked some with PT Plan is to go Home after hospital stay.  Objective: Vital signs in last 24 hours: Temp:  [97.9 F (36.6 C)-98.2 F (36.8 C)] 98 F (36.7 C) (11/20 0933) Pulse Rate:  [69-85] 81 (11/20 1413) Resp:  [14-18] 17 (11/20 1413) BP: (91-124)/(50-64) 105/60 (11/20 1413) SpO2:  [94 %-98 %] 98 % (11/20 1413)  Intake/Output from previous day:  Intake/Output Summary (Last 24 hours) at 04/09/2017 1919 Last data filed at 04/09/2017 1700 Gross per 24 hour  Intake 2243.75 ml  Output 2555 ml  Net -311.25 ml    Intake/Output this shift: No intake/output data recorded.  Labs: Recent Labs    04/09/17 0554  HGB 9.7*   Recent Labs    04/09/17 0554  WBC 11.2*  RBC 3.33*  HCT 29.1*  PLT 200   Recent Labs    04/09/17 0554  NA 139  K 4.4  CL 107  CO2 26  BUN 11  CREATININE 0.49  GLUCOSE 187*  CALCIUM 8.8*   No results for input(s): LABPT, INR in the last 72 hours.  EXAM General - Patient is Alert, Appropriate and Oriented Extremity - Neurovascular intact Sensation intact distally Intact pulses distally Dorsiflexion/Plantar flexion intact Dressing - dressing C/D/I Motor Function - intact, moving foot and toes well on exam.  Hemovac pulled without difficulty.  Past Medical History:  Diagnosis Date  . Allergy    fall seasonal  . Anxiety   . Blood transfusion    Platelet transfusion when on heparin  . Breast cancer ([place]) 2001   R breast(lumpectomy,chemo,radiation,tamoxifen)  . C. difficile diarrhea 02/2016   treated with Flagyl    . Complication of anesthesia    gets Jillyn Ledger going under she has been told  . Depression    treated  . Diabetes mellitus    type 2  . GERD (gastroesophageal reflux disease)   . Hyperlipidemia   . Hypertension    resolved after weight loss surgery; recurred 2013  . Insomnia chronic  . Iron deficiency    h/o  . Microalbuminuria    h/o  . OA (osteoarthritis) of knee   . Osteoporosis   . Plantar fasciitis (07') DrRegal   s/p surgical release 01/2011  . Sleep apnea    resolved after weight loss surgery; recurrent with weight gain; on CPAP  . Vitamin D deficiency     Assessment/Plan: 1 Day Post-Op Procedure(s) (LRB): LEFT TOTAL KNEE ARTHROPLASTY (Left) Principal Problem:   OA (osteoarthritis) of knee  Estimated body mass index is 33.11 kg/m as calculated from the following:   Height as of this encounter: 4\' 9"  (1.448 m).   Weight as of this encounter: 69.4 kg (153 lb). Advance diet Up with therapy Plan for discharge tomorrow Discharge home - straight to outpatient therapy  DVT Prophylaxis - Xarelto Weight-Bearing as tolerated to left leg D/C O2 and Pulse OX and try on Room Air  Arlee Muslim, PA-C Orthopaedic Surgery 04/09/2017, 7:19 PM

## 2017-04-10 LAB — BASIC METABOLIC PANEL
ANION GAP: 8 (ref 5–15)
BUN: 15 mg/dL (ref 6–20)
CALCIUM: 9 mg/dL (ref 8.9–10.3)
CHLORIDE: 103 mmol/L (ref 101–111)
CO2: 27 mmol/L (ref 22–32)
CREATININE: 0.53 mg/dL (ref 0.44–1.00)
GFR calc Af Amer: >60 mL/min (ref >60)
GFR calc non Af Amer: >60 mL/min (ref >60)
GLUCOSE: 168 mg/dL — AB (ref 65–99)
Potassium: 3.9 mmol/L (ref 3.5–5.1)
Sodium: 138 mmol/L (ref 135–145)

## 2017-04-10 LAB — GLUCOSE, CAPILLARY
Glucose-Capillary: 127 mg/dL — ABNORMAL HIGH (ref 65–99)
Glucose-Capillary: 136 mg/dL — ABNORMAL HIGH (ref 65–99)

## 2017-04-10 LAB — CBC
HEMATOCRIT: 30.1 % — AB (ref 36.0–46.0)
HEMOGLOBIN: 10.1 g/dL — AB (ref 12.0–15.0)
MCH: 29.4 pg (ref 26.0–34.0)
MCHC: 33.6 g/dL (ref 30.0–36.0)
MCV: 87.5 fL (ref 78.0–100.0)
Platelets: 224 10*3/uL (ref 150–400)
RBC: 3.44 MIL/uL — ABNORMAL LOW (ref 3.87–5.11)
RDW: 13.9 % (ref 11.5–15.5)
WBC: 12.3 10*3/uL — ABNORMAL HIGH (ref 4.0–10.5)

## 2017-04-10 NOTE — Progress Notes (Signed)
   Subjective: 2 Days Post-Op Procedure(s) (LRB): LEFT TOTAL KNEE ARTHROPLASTY (Left) Patient reports pain as mild.   Patient seen in rounds for Dr. Wynelle Link.  Doing well and ready to home. Patient is well, and has had no acute complaints or problems Patient is ready to go home following therapy  Objective: Vital signs in last 24 hours: Temp:  [98 F (36.7 C)-98.5 F (36.9 C)] 98.5 F (36.9 C) (11/21 0439) Pulse Rate:  [72-75] 72 (11/21 0439) Resp:  [16] 16 (11/21 0439) BP: (131-136)/(67-79) 136/79 (11/21 0439) SpO2:  [99 %-100 %] 99 % (11/21 0439)  Intake/Output from previous day:  Intake/Output Summary (Last 24 hours) at 04/10/2017 1423 Last data filed at 04/10/2017 1100 Gross per 24 hour  Intake 750 ml  Output -  Net 750 ml    Intake/Output this shift: Total I/O In: 240 [P.O.:240] Out: -   Labs: Recent Labs    04/09/17 0554 04/10/17 0531  HGB 9.7* 10.1*   Recent Labs    04/09/17 0554 04/10/17 0531  WBC 11.2* 12.3*  RBC 3.33* 3.44*  HCT 29.1* 30.1*  PLT 200 224   Recent Labs    04/09/17 0554 04/10/17 0531  NA 139 138  K 4.4 3.9  CL 107 103  CO2 26 27  BUN 11 15  CREATININE 0.49 0.53  GLUCOSE 187* 168*  CALCIUM 8.8* 9.0   No results for input(s): LABPT, INR in the last 72 hours.  EXAM: General - Patient is Alert, Appropriate and Oriented Extremity - Neurovascular intact Sensation intact distally Intact pulses distally Dorsiflexion/Plantar flexion intact Incision - clean, dry, no drainage Motor Function - intact, moving foot and toes well on exam.   Assessment/Plan: 2 Days Post-Op Procedure(s) (LRB): LEFT TOTAL KNEE ARTHROPLASTY (Left) Procedure(s) (LRB): LEFT TOTAL KNEE ARTHROPLASTY (Left) Past Medical History:  Diagnosis Date  . Allergy    fall seasonal  . Anxiety   . Blood transfusion    Platelet transfusion when on heparin  . Breast cancer ([place]) 2001   R breast(lumpectomy,chemo,radiation,tamoxifen)  . C. difficile diarrhea  02/2016   treated with Flagyl  . Complication of anesthesia    gets Jillyn Ledger going under she has been told  . Depression    treated  . Diabetes mellitus    type 2  . GERD (gastroesophageal reflux disease)   . Hyperlipidemia   . Hypertension    resolved after weight loss surgery; recurred 2013  . Insomnia chronic  . Iron deficiency    h/o  . Microalbuminuria    h/o  . OA (osteoarthritis) of knee   . Osteoporosis   . Plantar fasciitis (07') DrRegal   s/p surgical release 01/2011  . Sleep apnea    resolved after weight loss surgery; recurrent with weight gain; on CPAP  . Vitamin D deficiency    Principal Problem:   OA (osteoarthritis) of knee  Estimated body mass index is 33.11 kg/m as calculated from the following:   Height as of this encounter: 4\' 9"  (1.448 m).   Weight as of this encounter: 69.4 kg (153 lb). Up with therapy Diet - Cardiac diet and Diabetic diet Follow up - in 2 weeks Activity - WBAT Disposition - Home Condition Upon Discharge - Good D/C Meds - See DC Summary DVT Prophylaxis - Xarelto  Arlee Muslim, PA-C Orthopaedic Surgery 04/10/2017, 2:23 PM

## 2017-04-10 NOTE — Progress Notes (Signed)
Physical Therapy Treatment Patient Details Name: Julie Fischer MRN: [id number] DOB: 11-07-1960 Today's Date: 04/10/2017    History of Present Illness L TKA    PT Comments    Pt ambulated 125' with RW and performed TKA exercises with supervision, she is independent with L SLR. Will do stair training this afternoon then expect pt will be ready to DC home.   Follow Up Recommendations  DC plan and follow up therapy as arranged by surgeon     Equipment Recommendations  Rolling walker with 5" wheels    Recommendations for Other Services       Precautions / Restrictions Precautions Precautions: Fall;Knee Precaution Comments: reviewed no pillow under knee Restrictions Weight Bearing Restrictions: No Other Position/Activity Restrictions: WBAT    Mobility  Bed Mobility Overal bed mobility: Modified Independent Bed Mobility: Supine to Sit     Supine to sit: Modified independent (Device/Increase time);HOB elevated     General bed mobility comments: HOB up, used rail  Transfers Overall transfer level: Needs assistance Equipment used: Rolling walker (2 wheeled) Transfers: Sit to/from Stand Sit to Stand: Modified independent (Device/Increase time)         General transfer comment: good hand placement  Ambulation/Gait Ambulation/Gait assistance: Supervision Ambulation Distance (Feet): 125 Feet Assistive device: Rolling walker (2 wheeled) Gait Pattern/deviations: Step-to pattern;Decreased step length - right;Decreased step length - left;Decreased stride length   Gait velocity interpretation: Below normal speed for age/gender General Gait Details: steady with RW, good sequencing   Stairs            Wheelchair Mobility    Modified Rankin (Stroke Patients Only)       Balance Overall balance assessment: Modified Independent                                          Cognition Arousal/Alertness: Awake/alert Behavior During Therapy: WFL  for tasks assessed/performed Overall Cognitive Status: Within Functional Limits for tasks assessed                                        Exercises Total Joint Exercises Ankle Circles/Pumps: AROM;Both;10 reps;Supine Quad Sets: AROM;Both;Supine;10 reps Short Arc Quad: AROM;Left;10 reps;Supine Heel Slides: Left;10 reps;Supine;AAROM Hip ABduction/ADduction: AROM;Left;10 reps;Supine Straight Leg Raises: AROM;Left;Supine;10 reps Long Arc Quad: AROM;Left;5 reps;Seated Knee Flexion: AAROM;Left;5 reps;Seated Goniometric ROM: 0-45* AAROM L knee    General Comments        Pertinent Vitals/Pain Pain Score: 6  Pain Location: L knee Pain Descriptors / Indicators: Sore;Tightness Pain Intervention(s): Limited activity within patient's tolerance;Monitored during session;Premedicated before session;Ice applied    Home Living                      Prior Function            PT Goals (current goals can now be found in the care plan section) Acute Rehab PT Goals Patient Stated Goal: to walk PT Goal Formulation: With patient Time For Goal Achievement: 04/22/17 Potential to Achieve Goals: Good Progress towards PT goals: Progressing toward goals    Frequency    7X/week      PT Plan      Co-evaluation              AM-PAC PT "6 Clicks" Daily Activity  Outcome  Measure  Difficulty turning over in bed (including adjusting bedclothes, sheets and blankets)?: None Difficulty moving from lying on back to sitting on the side of the bed? : A Little Difficulty sitting down on and standing up from a chair with arms (e.g., wheelchair, bedside commode, etc,.)?: None Help needed moving to and from a bed to chair (including a wheelchair)?: None Help needed walking in hospital room?: A Little Help needed climbing 3-5 steps with a railing? : A Lot 6 Click Score: 20    End of Session Equipment Utilized During Treatment: Gait belt Activity Tolerance: Patient  tolerated treatment well Patient left: with call bell/phone within reach;in chair;with chair alarm set Nurse Communication: Mobility status PT Visit Diagnosis: Muscle weakness (generalized) (M62.81);Difficulty in walking, not elsewhere classified (R26.2);Pain Pain - Right/Left: Left Pain - part of body: Knee     Time: 7408-1448 PT Time Calculation (min) (ACUTE ONLY): 21 min  Charges:  $Gait Training: 8-22 mins                    G Codes:          Philomena Doheny 04/10/2017, 9:34 AM [phone]

## 2017-04-10 NOTE — Progress Notes (Signed)
Spoke with patient today, requesting to return the cane and now wants RW, order placed and requested [facility] to deliver to room. [phone]

## 2017-04-10 NOTE — Progress Notes (Signed)
Patient discharged to home w/ spouse. Given all belongings, instructions, prescriptions, equipment. Spouse present for all teaching. Both verbalized understanding of instructions. Escorted to pov via w.c.

## 2017-04-10 NOTE — Progress Notes (Signed)
Physical Therapy Treatment Patient Details Name: Julie Fischer MRN: [id number] DOB: 06-23-1960 Today's Date: 04/10/2017    History of Present Illness L TKA    PT Comments    Stair training completed with pt and her spouse. She is ready to DC home from PT standpoint.    Follow Up Recommendations  DC plan and follow up therapy as arranged by surgeon     Equipment Recommendations  Rolling walker with 5" wheels    Recommendations for Other Services       Precautions / Restrictions Precautions Precautions: Fall;Knee Precaution Comments: reviewed no pillow under knee Restrictions Weight Bearing Restrictions: No Other Position/Activity Restrictions: WBAT    Mobility  Bed Mobility Overal bed mobility: Modified Independent Bed Mobility: Supine to Sit     Supine to sit: Modified independent (Device/Increase time);HOB elevated     General bed mobility comments: HOB up, used rail  Transfers Overall transfer level: Modified independent Equipment used: Rolling walker (2 wheeled) Transfers: Sit to/from Stand Sit to Stand: Modified independent (Device/Increase time)         General transfer comment: good hand placement    Stairs Stairs: Yes   Stair Management: One rail Right;Step to pattern;Forwards;With crutches Number of Stairs: 4 General stair comments: husband present for stair training, VCs for sequencing, pt/spouse demonstrate good understanding of safe stair technique  Wheelchair Mobility    Modified Rankin (Stroke Patients Only)       Balance Overall balance assessment: Modified Independent                                          Cognition Arousal/Alertness: Awake/alert Behavior During Therapy: WFL for tasks assessed/performed Overall Cognitive Status: Within Functional Limits for tasks assessed                                           General Comments        Pertinent Vitals/Pain Pain Score: 4   Pain Location: L knee Pain Descriptors / Indicators: Sore;Tightness Pain Intervention(s): Limited activity within patient's tolerance;Monitored during session;Premedicated before session;Ice applied    Home Living                      Prior Function            PT Goals (current goals can now be found in the care plan section) Acute Rehab PT Goals Patient Stated Goal: to walk PT Goal Formulation: With patient Time For Goal Achievement: 04/22/17 Potential to Achieve Goals: Good Progress towards PT goals: Goals met/education completed, patient discharged from PT    Frequency    7X/week      PT Plan Current plan remains appropriate    Co-evaluation              AM-PAC PT "6 Clicks" Daily Activity  Outcome Measure  Difficulty turning over in bed (including adjusting bedclothes, sheets and blankets)?: None Difficulty moving from lying on back to sitting on the side of the bed? : None Difficulty sitting down on and standing up from a chair with arms (e.g., wheelchair, bedside commode, etc,.)?: None Help needed moving to and from a bed to chair (including a wheelchair)?: None Help needed walking in hospital room?: None Help needed climbing 3-5 steps with  a railing? : A Little 6 Click Score: 23    End of Session Equipment Utilized During Treatment: Gait belt Activity Tolerance: Patient tolerated treatment well Patient left: with call bell/phone within reach;in chair;with chair alarm set Nurse Communication: Mobility status PT Visit Diagnosis: Muscle weakness (generalized) (M62.81);Difficulty in walking, not elsewhere classified (R26.2);Pain Pain - Right/Left: Left Pain - part of body: Knee     Time: 1207-1225 PT Time Calculation (min) (ACUTE ONLY): 18 min  Charges:  $Gait Training: 8-22 mins $Self Care/Home Management: 8-22                    G Codes:         Blondell Reveal Kistler 04/10/2017, 12:48 PM [phone]

## 2017-04-15 DIAGNOSIS — M1712 Unilateral primary osteoarthritis, left knee: Secondary | ICD-10-CM | POA: Diagnosis not present

## 2017-04-18 DIAGNOSIS — M1712 Unilateral primary osteoarthritis, left knee: Secondary | ICD-10-CM | POA: Diagnosis not present

## 2017-04-22 DIAGNOSIS — M1712 Unilateral primary osteoarthritis, left knee: Secondary | ICD-10-CM | POA: Diagnosis not present

## 2017-04-25 DIAGNOSIS — M1712 Unilateral primary osteoarthritis, left knee: Secondary | ICD-10-CM | POA: Diagnosis not present

## 2017-05-06 DIAGNOSIS — M1712 Unilateral primary osteoarthritis, left knee: Secondary | ICD-10-CM | POA: Diagnosis not present

## 2017-05-09 DIAGNOSIS — M1712 Unilateral primary osteoarthritis, left knee: Secondary | ICD-10-CM | POA: Diagnosis not present

## 2017-05-12 IMAGING — CR DG KNEE COMPLETE 4+V*L*
4 series · 4 of 4 positions shown · non-contrast
Comparison: None.

CLINICAL DATA: Onset left knee pain x8 months x8 months more so
anteromedially to the patella

EXAM:
LEFT KNEE - COMPLETE 4+ VIEW

[w knee ap left]
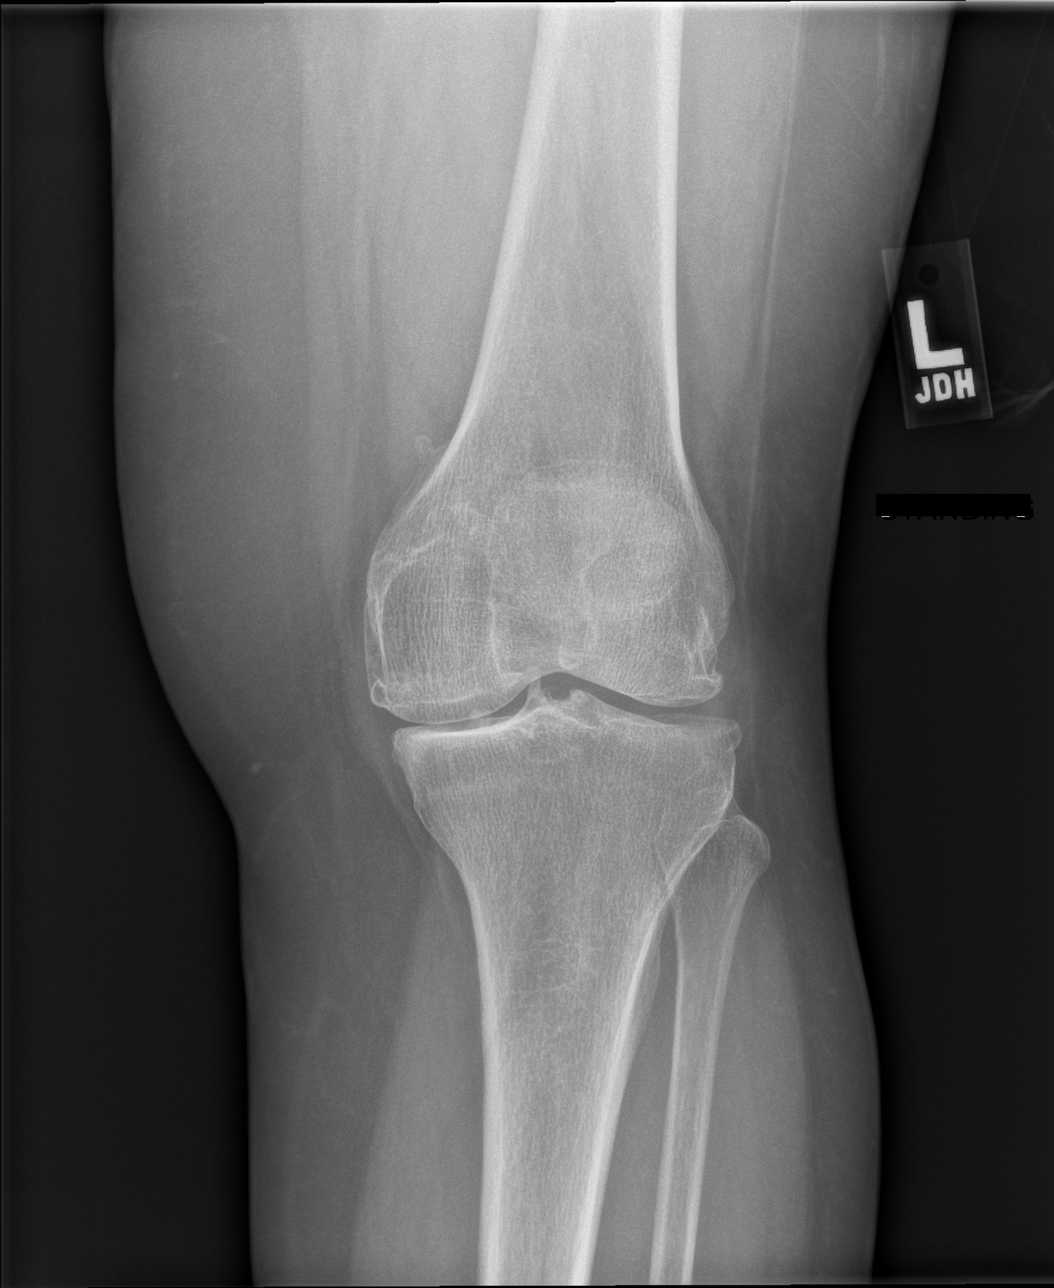

[w knee lat left]
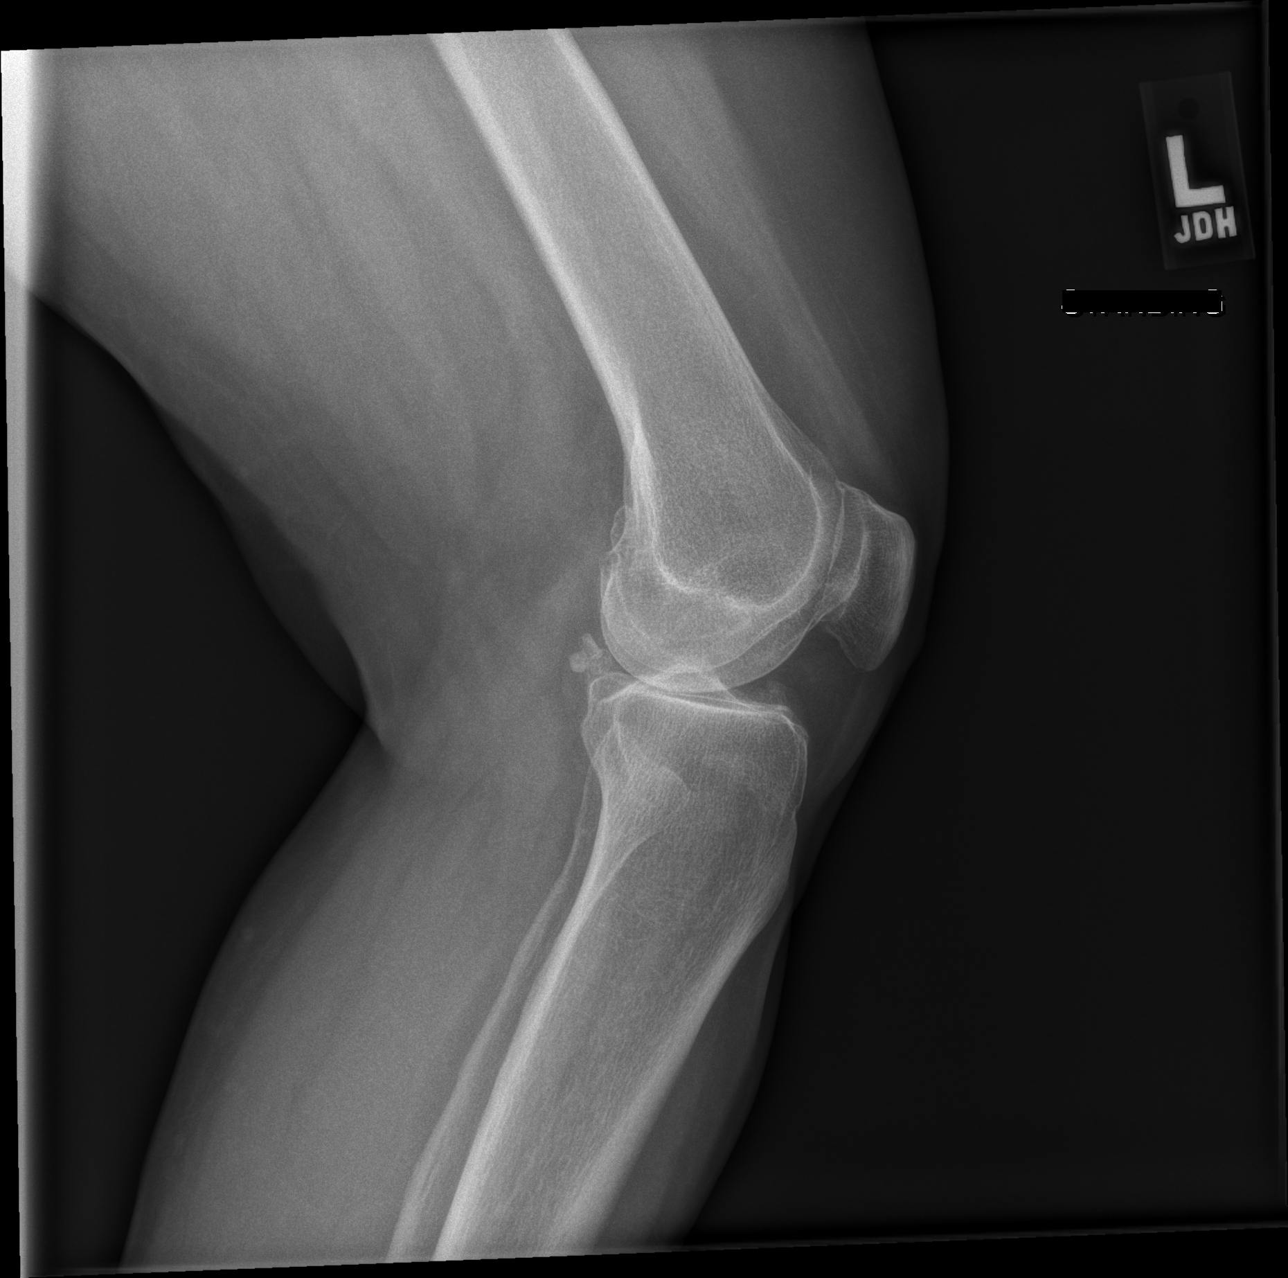

[x knee tunnel left]
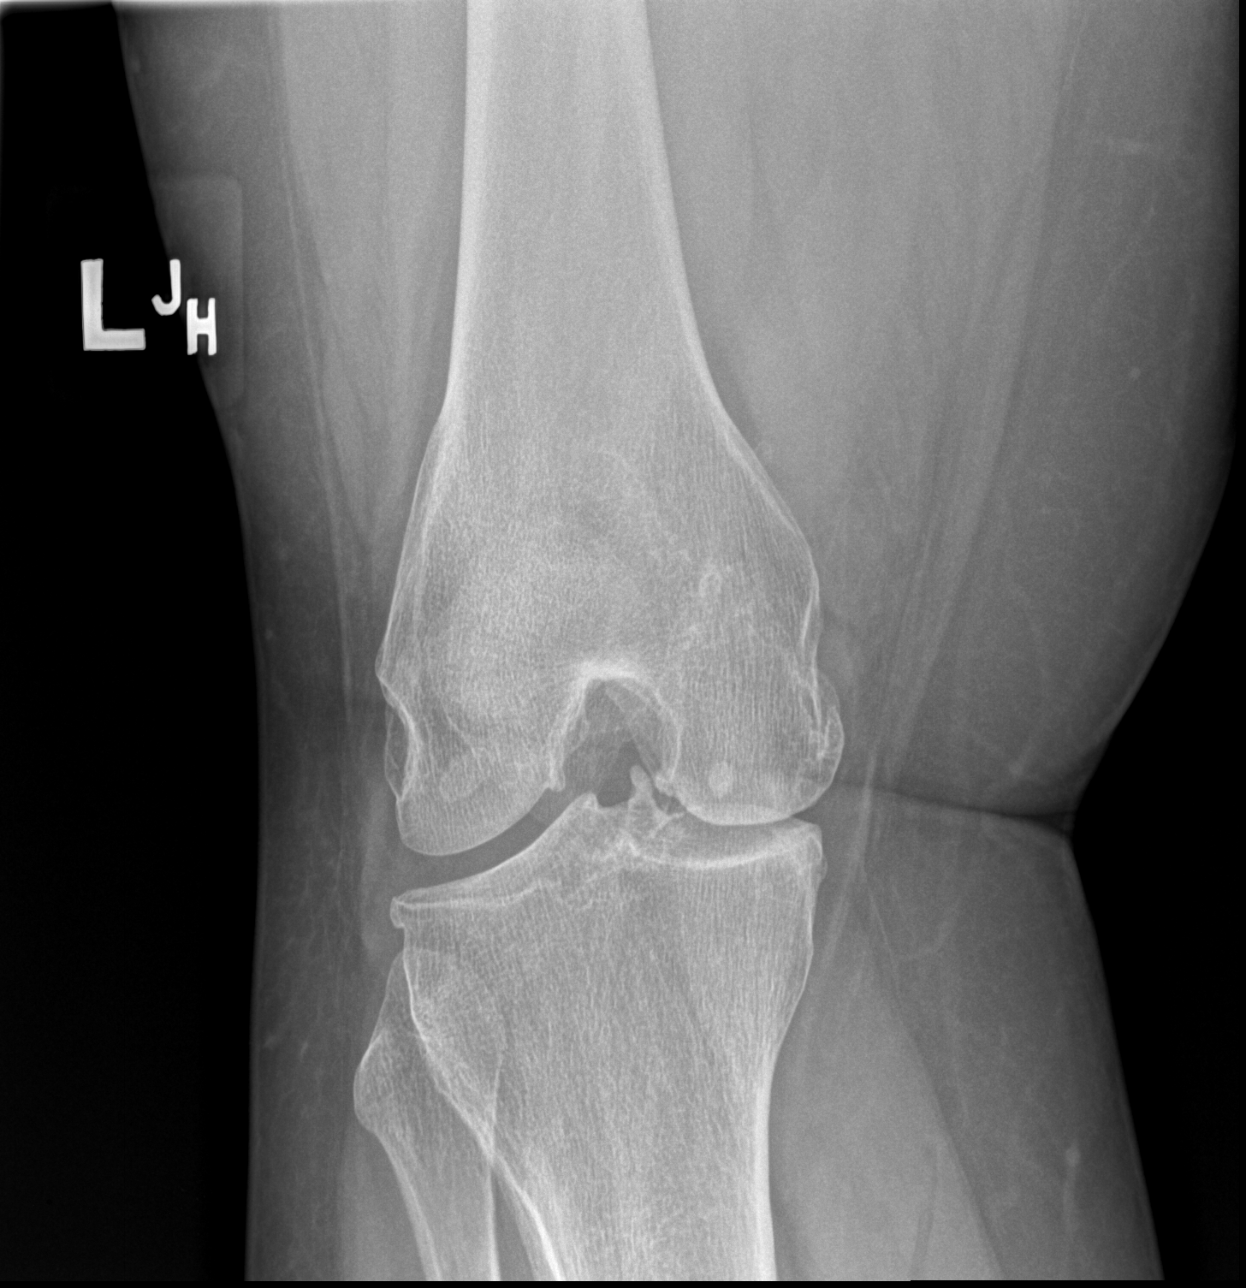

[x knee sunrise left]
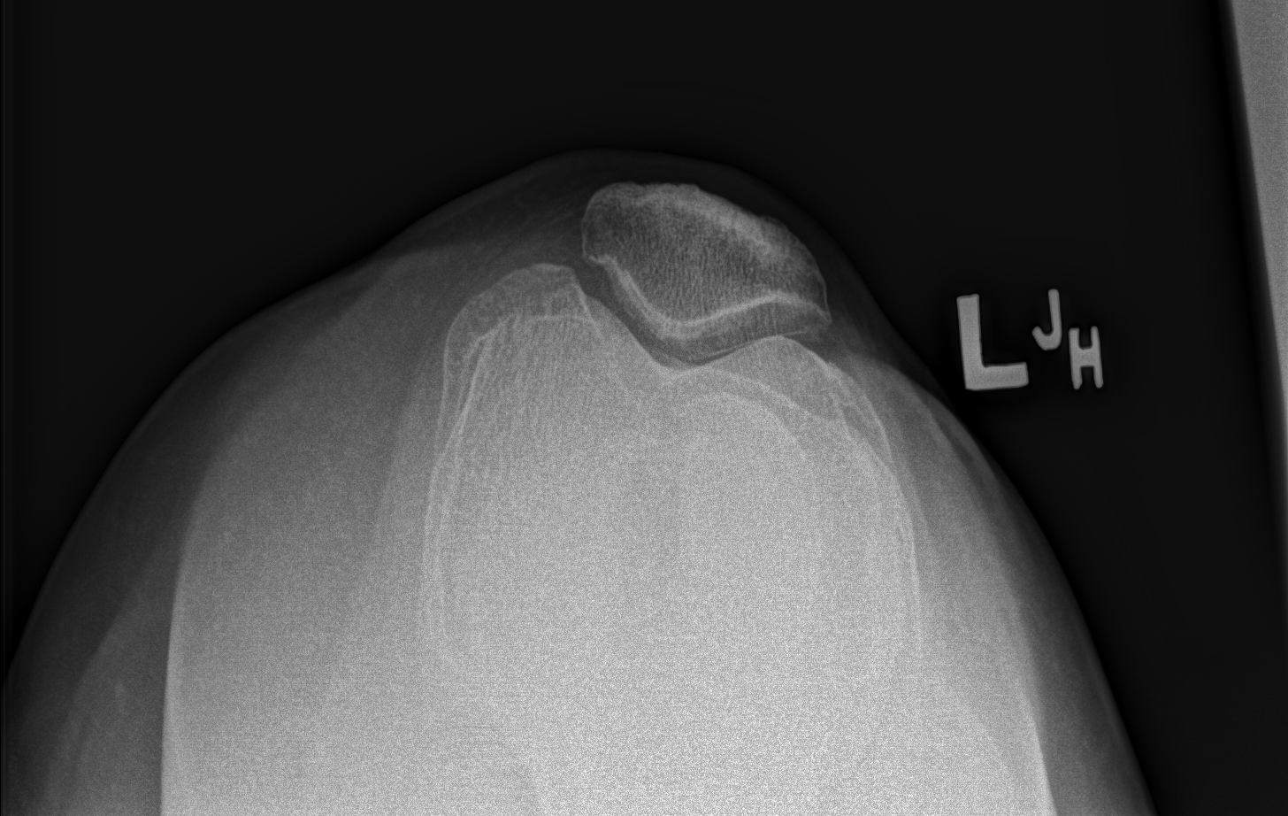

[4 of 4 positions shown; findings below may reference images not displayed]

FINDINGS: Femorotibial and patellofemoral joint space narrowing with spurring
consistent with osteoarthritis. A small posterior intra-articular
loose body is suggested posteriorly within the medial femorotibial
compartment
IMPRESSION: Tricompartmental osteoarthritis. Possible tiny ossific loose body
posteriorly within the medial femorotibial compartment.

## 2017-05-23 ENCOUNTER — Other Ambulatory Visit: Payer: Self-pay | Admitting: Family Medicine

## 2017-05-23 ENCOUNTER — Encounter: Payer: Self-pay | Admitting: Physician Assistant

## 2017-05-23 ENCOUNTER — Encounter: Payer: Self-pay | Admitting: Family Medicine

## 2017-05-23 DIAGNOSIS — Z1231 Encounter for screening mammogram for malignant neoplasm of breast: Secondary | ICD-10-CM

## 2017-05-24 DIAGNOSIS — Z471 Aftercare following joint replacement surgery: Secondary | ICD-10-CM | POA: Diagnosis not present

## 2017-05-24 DIAGNOSIS — F32A Depression, unspecified: Secondary | ICD-10-CM | POA: Insufficient documentation

## 2017-05-24 DIAGNOSIS — Z96652 Presence of left artificial knee joint: Secondary | ICD-10-CM | POA: Diagnosis not present

## 2017-05-28 DIAGNOSIS — M25562 Pain in left knee: Secondary | ICD-10-CM | POA: Diagnosis not present

## 2017-05-30 DIAGNOSIS — M25562 Pain in left knee: Secondary | ICD-10-CM | POA: Diagnosis not present

## 2017-06-04 DIAGNOSIS — M25562 Pain in left knee: Secondary | ICD-10-CM | POA: Diagnosis not present

## 2017-06-05 ENCOUNTER — Ambulatory Visit (INDEPENDENT_AMBULATORY_CARE_PROVIDER_SITE_OTHER): Payer: 59 | Admitting: Physician Assistant

## 2017-06-05 ENCOUNTER — Encounter: Payer: Self-pay | Admitting: Physician Assistant

## 2017-06-05 ENCOUNTER — Encounter (INDEPENDENT_AMBULATORY_CARE_PROVIDER_SITE_OTHER): Payer: Self-pay

## 2017-06-05 VITALS — BP 120/70 | HR 92 | Ht <= 58 in | Wt 154.5 lb

## 2017-06-05 DIAGNOSIS — Z800 Family history of malignant neoplasm of digestive organs: Secondary | ICD-10-CM

## 2017-06-05 DIAGNOSIS — Z1211 Encounter for screening for malignant neoplasm of colon: Secondary | ICD-10-CM | POA: Diagnosis not present

## 2017-06-05 DIAGNOSIS — Z96652 Presence of left artificial knee joint: Secondary | ICD-10-CM

## 2017-06-05 MED ORDER — NA SULFATE-K SULFATE-MG SULF 17.5-3.13-1.6 GM/177ML PO SOLN: 1.0000 | Freq: Once | ORAL | 0 refills | Status: AC

## 2017-06-05 NOTE — Progress Notes (Signed)
Subjective:    Patient ID: Julie Fischer, female    DOB: 19-Apr-1961, 57 y.o.   MRN: [id number]  HPI Julie Fischer is a very nice 57 year old white female known to Julie Fischer who comes in today to discuss recall colonoscopy. Patient was brought into the office because of anticoagulant use. On review she was placed on Xarelto afternoon replacement in November 2018, short-term and is no longer taking Xarelto or aspirin. Patient last had colonoscopy in August 2012 which was a normal exam with no polyps. Patient has family history of colon cancer in her mother diagnosed in her 91s. She has no current complaints of abdominal pain, changes in bowel habits melena or hematochezia. She says she tends more towards diarrhea but has had those symptoms for many years with no recent changes. Other medical problems include history of Roux-en-Y gastric bypass about 10 years ago, history of anemia, adult-onset diabetes mellitus, breast cancer, hyperlipidemia, osteoarthritis and hypertension.  Review of Systems Pertinent positive and negative review of systems were noted in the above HPI section.  All other review of systems was otherwise negative.  Outpatient Encounter Medications as of 06/05/2017  Medication Sig  . acetaminophen (TYLENOL) 500 MG tablet Take 1,500 mg every 6 (six) hours as needed by mouth for moderate pain or headache.   . cetirizine (ZYRTEC) 10 MG tablet Take 10 mg daily as needed by mouth for allergies.  Marland Kitchen gabapentin (NEURONTIN) 300 MG capsule Take 600 mg by mouth daily.  . hydrOXYzine (ATARAX/VISTARIL) 25 MG tablet Take 1 tablet (25 mg total) by mouth 3 (three) times daily as needed for itching.  Marland Kitchen lisinopril (PRINIVIL,ZESTRIL) 10 MG tablet Take 1 tablet (10 mg total) by mouth daily.  . metFORMIN (GLUCOPHAGE-XR) 500 MG 24 hr tablet Take  2 tablets daily (Patient taking differently: Take 1,000 mg daily by mouth. )  . methocarbamol (ROBAXIN) 500 MG tablet Take 1 tablet (500 mg total) by mouth  every 6 (six) hours as needed for muscle spasms.  Marland Kitchen omeprazole (PRILOSEC OTC) 20 MG tablet Take 1 capsule up to twice daily for the next 1-2 weeks (Patient taking differently: Take 20 mg daily as needed by mouth (for acid reflux or heartburn). )  . oxyCODONE (OXY IR/ROXICODONE) 5 MG immediate release tablet Take 1-2 tablets (5-10 mg total) by mouth every 4 (four) hours as needed for moderate pain or severe pain.  Marland Kitchen PARoxetine (PAXIL) 40 MG tablet Take 1 tablet (40 mg total) by mouth every morning.  . RESTASIS 0.05 % ophthalmic emulsion INSTILL 1 DROP IN EACH EYE 2 TIMES DAILY  . simethicone (MYLICON) 829 MG chewable tablet Chew 125 mg every 6 (six) hours as needed by mouth for flatulence.  . simvastatin (ZOCOR) 20 MG tablet Take 1 tablet (20 mg total) by mouth at bedtime.  . valACYclovir (VALTREX) 1000 MG tablet TAKE 2 TABLETS AT ONSET OF COLD SORE AND REPEAT IN 12 HOURS (4 TABLETS PER EPISODE OF COLD SORE)  . zolpidem (AMBIEN CR) 12.5 MG CR tablet Take 1 tablet (12.5 mg total) by mouth at bedtime.  . [DISCONTINUED] rivaroxaban (XARELTO) 10 MG TABS tablet Take 1 tablet (10 mg total) by mouth daily with breakfast. Take Xarelto for two and a half more weeks following discharge from the hospital, then discontinue Xarelto. Once the patient has completed the blood thinner regimen, then take a Baby 81 mg Aspirin daily for three more weeks.  . Na Sulfate-K Sulfate-Mg Sulf 17.5-3.13-1.6 GM/177ML SOLN Take 1 kit by mouth once for  1 dose.   No facility-administered encounter medications on file as of 06/05/2017.    Allergies  Allergen Reactions  . Heparin Other (See Comments)    Low platelets  . Niacin And Related Other (See Comments)    Blotchy/itchy  . Tessalon [Benzonatate] Diarrhea  . Tramadol Other (See Comments)    Dizziness (lasted 24 hours after taking 2 doses)  . Penicillins Rash and Other (See Comments)    Has patient had a PCN reaction causing immediate rash, facial/tongue/throat swelling,  SOB or lightheadedness with hypotension: Unknown Has patient had a PCN reaction causing severe rash involving mucus membranes or skin necrosis: Unknown Has patient had a PCN reaction that required hospitalization: No Has patient had a PCN reaction occurring within the last 10 years: No If all of the above answers are "NO", then may proceed with Cephalosporin use.    Patient Active Problem List   Diagnosis Date Noted  . Family hx of colon cancer 06/05/2017  . S/P TKR (total knee replacement), left 06/05/2017  . OA (osteoarthritis) of knee 04/08/2017  . History of bariatric surgery 07/29/2013  . Obesity (BMI 30-39.9) 07/29/2013  . Osteoporosis 01/28/2013  . Allergic rhinitis 01/28/2013  . Anemia, iron deficiency 07/30/2012  . Impaired fasting glucose 12/10/2011  . Essential hypertension, benign 12/10/2011  . Pure hypercholesterolemia 11/15/2010  . Insomnia 11/15/2010  . Depression, major, in remission ([place]) 11/15/2010  . Osteopenia 11/15/2010  . History of breast cancer 11/15/2010   Social History   Socioeconomic History  . Marital status: Married    Spouse name: Not on file  . Number of children: 0  . Years of education: Not on file  . Highest education level: Not on file  Social Needs  . Financial resource strain: Not on file  . Food insecurity - worry: Not on file  . Food insecurity - inability: Not on file  . Transportation needs - medical: Not on file  . Transportation needs - non-medical: Not on file  Occupational History  . Occupation: retired  Tobacco Use  . Smoking status: Former Smoker    Last attempt to quit: 01/28/1996    Years since quitting: 21.3  . Smokeless tobacco: Never Used  Substance and Sexual Activity  . Alcohol use: Yes    Comment: 1/2 beer every 1-2 months  . Drug use: No  . Sexual activity: Yes    Partners: Male  Other Topics Concern  . 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   Lives with husband, 2 dogs    Ms. Pinch's family history includes Alcohol abuse in her brother; Breast cancer in her cousin and cousin; Colon cancer (age of onset: 4) in her mother; Dementia in her mother; Diabetes in her brother and maternal grandmother; Drug abuse in her brother; Hepatitis C in her brother; Hyperlipidemia in her brother, sister, and sister; Hypertension in her brother; Lung cancer in her father; Macular degeneration in her mother; Stroke (age of onset: 29) in her mother.      Objective:    Vitals:   06/05/17 1025  BP: 120/70  Pulse: 92    Physical Exam well-developed white female in no acute distress, very pleasant blood pressure 120/70 pulse 92, BMI 34.0. HEENT; nontraumatic normocephalic EOMI PERRLA sclera anicteric, Cardiovascular ;regular rate and rhythm with S1-S2 no murmur or gallop, Pulmonary; clear bilaterally, Abdomen; soft, nontender nondistended bowel sounds  are active there is no palpable mass or hepatosplenomegaly, Rectal ;exam not done, Ext; no clubbing cyanosis or edema, patient is status post recent left knee replacement, Neuropsych ;mood and affect appropriate       Assessment & Plan:   #69 57 year old white female with family history of colon cancer in her mother, overdue for follow-up colonoscopy. She is asymptomatic. Last colonoscopy August 2012 was normal. Recent anticoagulation use-post knee replacement-has since been discontinued #2 hypertension #3 history of hyperlipidemia #4 history of breast cancer #5 history of adult-onset diabetes mellitus #6 status post Roux-en-Y gastric bypass approximately 10 years ago.  Plan; Patient will be scheduled for colonoscopy with Julie Fischer. Procedure was discussed in detail with patient including indications risks and benefits and she is agreeable to proceed.  Shaily Librizzi Genia Harold PA-C 06/05/2017   Cc: Rita Ohara, MD

## 2017-06-05 NOTE — Patient Instructions (Addendum)
You have been scheduled for a colonoscopy. Please follow written instructions given to you at your visit today.  Please pick up your prep supplies at the pharmacy within the next 1-3 days. [facility] [address]  If you use inhalers (even only as needed), please bring them with you on the day of your procedure. Your physician has requested that you go to www.startemmi.com and enter the access code given to you at your visit today. This web site gives a general overview about your procedure. However, you should still follow specific instructions given to you by our office regarding your preparation for the procedure.

## 2017-06-06 DIAGNOSIS — M25562 Pain in left knee: Secondary | ICD-10-CM | POA: Diagnosis not present

## 2017-06-06 NOTE — Progress Notes (Signed)
I agree with the above note, plan 

## 2017-06-14 ENCOUNTER — Ambulatory Visit
Admission: RE | Admit: 2017-06-14 | Discharge: 2017-06-14 | Disposition: A | Discharge status: Home / Self Care | Payer: 59 | Source: Ambulatory Visit | Attending: Family Medicine | Admitting: Family Medicine

## 2017-06-14 DIAGNOSIS — Z780 Asymptomatic menopausal state: Secondary | ICD-10-CM | POA: Diagnosis not present

## 2017-06-14 DIAGNOSIS — Z1231 Encounter for screening mammogram for malignant neoplasm of breast: Secondary | ICD-10-CM | POA: Diagnosis not present

## 2017-06-14 DIAGNOSIS — M810 Age-related osteoporosis without current pathological fracture: Secondary | ICD-10-CM

## 2017-06-14 HISTORY — DX: Personal history of antineoplastic chemotherapy: Z92.21

## 2017-06-14 HISTORY — DX: Personal history of irradiation: Z92.3

## 2017-06-25 NOTE — Progress Notes (Deleted)
  Patient presents for 3 month f/u on many issues.  She had mammogram in January (normal), as well as f/u bone density test. She had been doing well on alendronate, compliant and no side effects.  Recent DEXA showed T-2.7 at spine (was -2.0 at spine in 08/2014). This showed significant worsening of bone density. She was advised that the fosamax does not seem to be doing its job, and that we would discuss other treatment options at her visit today.  S/p L TKR in November by Dr. Wynelle Link. Getting PT  Diabetes: She was started back on Metformin inOctober 2017 after her A1c had jumped to 7.9.In 08/2016 the A1c was down to 6.1%, but it had gone back up to 7.5% at her last visit, the end of October 2018. This was felt to be due to dietary changes and limited exercise due to knee pain. She wasn't checking her sugars, and she reported eating more candy and cookies, casseroles (rice or noodles).  Med changes were not made--focusing on dietary improvement and monitoring blood sugars (so aware of the consequences and makes better choices). She is due for A1c today. We discussed potentially adding [place] or Jardiance if not improved.  She has been taking Metformin 1000mg  daily without side effects. She denies polydipsia, polyuria, vision changes, numbness/tingling. Checks feet regularly without concerns.   She is past due for eye exam and plans to schedule. UPDATE???  Hypertension follow-up: Hasn't been checking blood pressures at home recently. Denies dizziness, headaches, chest pain. Denies side effects of medications, no cough.      ASSESSMENT/PLAN:   Osteoporosis--worsening despite compliance with alendronate.  Change to Prolia

## 2017-06-26 ENCOUNTER — Encounter: Payer: 59 | Admitting: Family Medicine

## 2017-06-30 NOTE — Progress Notes (Signed)
Chief Complaint  Patient presents with  . Diabetes    nonfasting med check, no new concerns except she has had some diarrhea over the last few days.    Patient presents for 3 month f/u on many issues.  She has some diarrhea the last few days, 4-5 times in the mornings. Had ABX with her knee surgery. Denies abdominal pain, fever.  Some mild nausea. No changes in diet. Not having much dairy recently.  She had mammogram in January (normal), as well as f/u bone density test. She had been doing well on alendronate, compliant and no side effects.  Recent DEXA showed T-2.7 at spine (was -2.0 at spine in 08/2014). This showed significant worsening of bone density. She was advised that the fosamax does not seem to be doing its job, and that we would discuss other treatment options at her visit today.  S/p L TKR in November by Dr. Wynelle Link. Completed PT, does her home exercises fairly regularly. She has "good days and bad days". She is still taking robaxin twice daily (not much help with knee pain) and gabapentin BID; has f/u tomorrow.   Diabetes:She was started back on Metformin inOctober2017after her A1c had jumped to 7.9.In 08/2016 the A1c was down to 6.1%, but it had gone back up to 7.5% at her last visit, the end of October 2018.This was felt to be due to dietary changes and limited exercise due to knee pain. She wasn't checking her sugars, and she reported eating more candy and cookies, casseroles (rice or noodles).  Med changes were not made--focusing on dietary improvement and monitoring blood sugars (so aware of the consequences and makes better choices).  She reports that she still has not been checking her blood sugars. She is due for A1c today. We discussed potentially adding [place] or Jardiance if not improved.  She has been taking Metformin 1000mg  daily without side effects. She denies polydipsia, polyuria, vision changes, numbness/tingling. Checks feet regularly without concerns.   She  is past due for eye exam and is scheduled for next week.  Hypertension follow-up: Hasn't been checking blood pressures at home recently (rarely at [facility], recalls it being okay). Denies dizziness, headaches, chest pain. Denies side effects of medications, no cough.    has appt 2/26 with GI Dr. Ardis Hughs, for colonoscopy.  PMH, [place] [facility] reviewed  Outpatient Encounter Medications as of 07/01/2017  Medication Sig  . cetirizine (ZYRTEC) 10 MG tablet Take 10 mg daily as needed by mouth for allergies.  Marland Kitchen gabapentin (NEURONTIN) 300 MG capsule Take 300 mg by mouth 2 (two) times daily.   Marland Kitchen lisinopril (PRINIVIL,ZESTRIL) 10 MG tablet Take 1 tablet (10 mg total) by mouth daily.  . metFORMIN (GLUCOPHAGE-XR) 500 MG 24 hr tablet Take  2 tablets daily (Patient taking differently: Take 1,000 mg daily by mouth. )  . methocarbamol (ROBAXIN) 500 MG tablet Take 1 tablet (500 mg total) by mouth every 6 (six) hours as needed for muscle spasms.  Marland Kitchen omeprazole (PRILOSEC OTC) 20 MG tablet Take 1 capsule up to twice daily for the next 1-2 weeks (Patient taking differently: Take 20 mg daily as needed by mouth (for acid reflux or heartburn). )  . PARoxetine (PAXIL) 40 MG tablet Take 1 tablet (40 mg total) by mouth every morning.  . RESTASIS 0.05 % ophthalmic emulsion INSTILL 1 DROP IN EACH EYE 2 TIMES DAILY  . simvastatin (ZOCOR) 20 MG tablet Take 1 tablet (20 mg total) by mouth at bedtime.  Marland Kitchen zolpidem (AMBIEN  CR) 12.5 MG CR tablet Take 1 tablet (12.5 mg total) by mouth at bedtime.  . [DISCONTINUED] zolpidem (AMBIEN CR) 12.5 MG CR tablet Take 1 tablet (12.5 mg total) by mouth at bedtime.  Marland Kitchen acetaminophen (TYLENOL) 500 MG tablet Take 1,500 mg every 6 (six) hours as needed by mouth for moderate pain or headache.   . dapagliflozin propanediol (FARXIGA) 5 MG TABS tablet Take 5 mg by mouth daily.  . hydrOXYzine (ATARAX/VISTARIL) 25 MG tablet Take 1 tablet (25 mg total) by mouth 3 (three) times daily as needed for itching.  (Patient not taking: Reported on 07/01/2017)  . simethicone (MYLICON) 540 MG chewable tablet Chew 125 mg every 6 (six) hours as needed by mouth for flatulence.  . valACYclovir (VALTREX) 1000 MG tablet TAKE 2 TABLETS AT ONSET OF COLD SORE AND REPEAT IN 12 HOURS (4 TABLETS PER EPISODE OF COLD SORE) (Patient not taking: Reported on 07/01/2017)  . [DISCONTINUED] oxyCODONE (OXY IR/ROXICODONE) 5 MG immediate release tablet Take 1-2 tablets (5-10 mg total) by mouth every 4 (four) hours as needed for moderate pain or severe pain.   No facility-administered encounter medications on file as of 07/01/2017.    Allergies  Allergen Reactions  . Heparin Other (See Comments)    Low platelets  . Niacin And Related Other (See Comments)    Blotchy/itchy  . Tessalon [Benzonatate] Diarrhea  . Tramadol Other (See Comments)    Dizziness (lasted 24 hours after taking 2 doses)  . Penicillins Rash and Other (See Comments)    Has patient had a PCN reaction causing immediate rash, facial/tongue/throat swelling, SOB or lightheadedness with hypotension: Unknown Has patient had a PCN reaction causing severe rash involving mucus membranes or skin necrosis: Unknown Has patient had a PCN reaction that required hospitalization: No Has patient had a PCN reaction occurring within the last 10 years: No If all of the above answers are "NO", then may proceed with Cephalosporin use.    ROS:  No fever, chills, URI symptoms, chest pain, palpitations, shortness of breath, headache, dizziness, bleeding, bruising, rash, depression.  Some persistent knee pain. No vision changes, polydipsia, polyuria, urinary complaints.  Diarrhea as per HPI. No vomiting or abdominal pain. No urinary complaints.  PHYSICAL EXAM:  BP 132/80   Pulse 80   Ht 4' 8.5" (1.435 m)   Wt 156 lb 3.2 oz (70.9 kg)   BMI 34.40 kg/m   Wt Readings from Last 3 Encounters:  07/01/17 156 lb 3.2 oz (70.9 kg)  06/05/17 154 lb 8 oz (70.1 kg)  04/08/17 153 lb (69.4 kg)    Well-appearing, pleasant, obese female in no distress HEENT: PERRL, EOMI, conjunctiva and sclera clear. OP clear Neck: no lymphadenopathy, thyromegaly or bruit Heart: regular rate and rhythm Lungs: clear bilaterally Back: no spinal or CVA tenderness Abdomen: soft, nontender, normal bowel sounds, no mass Extremities: no edema Skin: normal turgor, no rash Psych: normal mood, affect, hygiene and grooming Neuro: alert and oriented, cranial nerves intact, normal gait   Lab Results  Component Value Date   HGBA1C 8.3 07/01/2017     ASSESSMENT/PLAN:  Osteoporosis, unspecified osteoporosis type, unspecified pathological fracture presence - worsening bone density despite alendronate use.  Change to Prolia. Cont Ca, bit D, weight-bearing exercise  Poorly controlled diabetes mellitus ([place]) - add Farxiga; start checking blood sugars.  Return in 6 weeks with blood sugar log. risks/side effects reviewed. Cont current metformin dose - Plan: dapagliflozin propanediol (FARXIGA) 5 MG TABS tablet  Type 2 diabetes mellitus without  complication, without long-term current use of insulin ([place]) - Plan: HgB A1c  Diarrhea, unspecified type - only recent onset, so doubt from metformin; supportive measures reviewed; consider stool studies if persists  Insomnia, unspecified type - controlled with ambien CR. Refilled today - Plan: zolpidem (AMBIEN CR) 12.5 MG CR tablet   Osteoporosis--worsening despite compliance with alendronate.  Change to Prolia--send to Juliann Pulse to check on insurance coverage/auth/pricing.  Uncontrolled DM--add Wilder Glade 5mg , increase to 10 at f/u if tolerating. Risks/side effects reviewed, as well as appropriate diet, need to monitor blood sugars, regular exercise (as directed by ortho) and weight loss.    It is very important that you start monitoring your blood sugars--partly to allow [place] to know sooner than 3 months if medications need to be adjusted further, but also so that you can  get feedback on what causes your sugar to be high, and to then adjust your diet.    We are going to start you on Farxiga. Let us know if you develop vaginal itching or discharge to indicate a yeast infection (increased risk of this and bladder infections with this medication).  We are having Juliann Pulse look into the insurance coverage and cost of Prolia, to take the place of alendronate in treating osteoporosis.  Avoid dairy; consider taking some metamucil or fiber supplement to bulk up the stools. Probiotics may also help--take these daily for at a least a few weeks. If you develop fever, worsening diarrhea, blood in the stool, abdominal pain, then we need to investigate further (specifically for C.difficile infection given recent antibiotics).   Return in 6 weeks with your list of blood sugars.   CPE due November

## 2017-07-01 ENCOUNTER — Encounter: Payer: Self-pay | Admitting: Family Medicine

## 2017-07-01 ENCOUNTER — Ambulatory Visit (INDEPENDENT_AMBULATORY_CARE_PROVIDER_SITE_OTHER): Payer: 59 | Admitting: Family Medicine

## 2017-07-01 VITALS — BP 132/80 | HR 80 | Ht <= 58 in | Wt 156.2 lb

## 2017-07-01 DIAGNOSIS — E119 Type 2 diabetes mellitus without complications: Secondary | ICD-10-CM

## 2017-07-01 DIAGNOSIS — R197 Diarrhea, unspecified: Secondary | ICD-10-CM

## 2017-07-01 DIAGNOSIS — E1165 Type 2 diabetes mellitus with hyperglycemia: Secondary | ICD-10-CM

## 2017-07-01 DIAGNOSIS — G4700 Insomnia, unspecified: Secondary | ICD-10-CM | POA: Diagnosis not present

## 2017-07-01 DIAGNOSIS — M810 Age-related osteoporosis without current pathological fracture: Secondary | ICD-10-CM

## 2017-07-01 LAB — POCT GLYCOSYLATED HEMOGLOBIN (HGB A1C): HEMOGLOBIN A1C: 8.3

## 2017-07-01 MED ORDER — ZOLPIDEM TARTRATE ER 12.5 MG PO TBCR: 12.5000 mg | EXTENDED_RELEASE_TABLET | Freq: Every day | ORAL | 0 refills | Status: DC

## 2017-07-01 MED ORDER — DAPAGLIFLOZIN PROPANEDIOL 5 MG PO TABS: 5.0000 mg | ORAL_TABLET | Freq: Every day | ORAL | 1 refills | Status: DC

## 2017-07-01 NOTE — Patient Instructions (Addendum)
   It is very important that you start monitoring your blood sugars--partly to allow [place] to know sooner than 3 months if medications need to be adjusted further, but also so that you can get feedback on what causes your sugar to be high, and to then adjust your diet.    We are going to start you on Farxiga. Let us know if you develop vaginal itching or discharge to indicate a yeast infection (increased risk of this and bladder infections with this medication).  We are having Juliann Pulse look into the insurance coverage and cost of Prolia, to take the place of alendronate in treating osteoporosis.  Avoid dairy; consider taking some metamucil or fiber supplement to bulk up the stools. Probiotics may also help--take these daily for at a least a few weeks. If you develop fever, worsening diarrhea, blood in the stool, abdominal pain, then we need to investigate further (specifically for C.difficile infection given recent antibiotics).   Return in 6 weeks with your list of blood sugars.

## 2017-07-02 ENCOUNTER — Encounter: Payer: Self-pay | Admitting: Gastroenterology

## 2017-07-03 ENCOUNTER — Telehealth: Payer: Self-pay | Admitting: Family Medicine

## 2017-07-03 NOTE — Progress Notes (Signed)
Request for insurance verification faxed to [phone]

## 2017-07-10 DIAGNOSIS — H16223 Keratoconjunctivitis sicca, not specified as Sjogren's, bilateral: Secondary | ICD-10-CM | POA: Diagnosis not present

## 2017-07-10 DIAGNOSIS — E119 Type 2 diabetes mellitus without complications: Secondary | ICD-10-CM | POA: Diagnosis not present

## 2017-07-10 LAB — HM DIABETES EYE EXAM

## 2017-07-11 DIAGNOSIS — G4733 Obstructive sleep apnea (adult) (pediatric): Secondary | ICD-10-CM | POA: Diagnosis not present

## 2017-07-11 NOTE — Telephone Encounter (Signed)
dt ?

## 2017-07-16 ENCOUNTER — Encounter: Payer: Self-pay | Admitting: Gastroenterology

## 2017-07-16 ENCOUNTER — Other Ambulatory Visit: Payer: Self-pay

## 2017-07-16 ENCOUNTER — Ambulatory Visit (AMBULATORY_SURGERY_CENTER): Payer: 59 | Admitting: Gastroenterology

## 2017-07-16 ENCOUNTER — Other Ambulatory Visit: Payer: Self-pay | Admitting: Family Medicine

## 2017-07-16 ENCOUNTER — Telehealth: Payer: Self-pay | Admitting: Family Medicine

## 2017-07-16 VITALS — BP 111/61 | HR 59 | Temp 97.8°F | Resp 13 | Ht <= 58 in | Wt 154.0 lb

## 2017-07-16 DIAGNOSIS — Z800 Family history of malignant neoplasm of digestive organs: Secondary | ICD-10-CM | POA: Diagnosis present

## 2017-07-16 DIAGNOSIS — Z1211 Encounter for screening for malignant neoplasm of colon: Secondary | ICD-10-CM | POA: Diagnosis not present

## 2017-07-16 DIAGNOSIS — Z1212 Encounter for screening for malignant neoplasm of rectum: Secondary | ICD-10-CM | POA: Diagnosis not present

## 2017-07-16 MED ORDER — FLUCONAZOLE 150 MG PO TABS: 150.0000 mg | ORAL_TABLET | Freq: Once | ORAL | 0 refills | Status: AC

## 2017-07-16 MED ORDER — SODIUM CHLORIDE 0.9 % IV SOLN: 500.0000 mL | Freq: Once | INTRAVENOUS | Status: DC

## 2017-07-16 NOTE — Telephone Encounter (Signed)
Informed pt .

## 2017-07-16 NOTE — Telephone Encounter (Signed)
Pt come and states that she has a yeast infection she has tried over the counter monistat but noting is working pt [id number] [place], [place] [address]. AT [address] pt can be reached at [phone]

## 2017-07-16 NOTE — Progress Notes (Signed)
Spontaneous respirations throughout. VSS. Resting comfortably. To PACU on room air. Report to  RN. 

## 2017-07-16 NOTE — Telephone Encounter (Signed)
Please have her hold her simvastatin for 48 hours when while taking the one dose of diflucan for yeast.

## 2017-07-16 NOTE — Progress Notes (Signed)
Pt's states no medical or surgical changes since previsit or office visit. 

## 2017-07-16 NOTE — Patient Instructions (Signed)
YOU HAD AN ENDOSCOPIC PROCEDURE TODAY AT THE [facility]:   Refer to the procedure report that was given to you for any specific questions about what was found during the examination.  If the procedure report does not answer your questions, please call your gastroenterologist to clarify.  If you requested that your care partner not be given the details of your procedure findings, then the procedure report has been included in a sealed envelope for you to review at your convenience later.  YOU SHOULD EXPECT: Some feelings of bloating in the abdomen. Passage of more gas than usual.  Walking can help get rid of the air that was put into your GI tract during the procedure and reduce the bloating. If you had a lower endoscopy (such as a colonoscopy or flexible sigmoidoscopy) you may notice spotting of blood in your stool or on the toilet paper. If you underwent a bowel prep for your procedure, you may not have a normal bowel movement for a few days.  Please Note:  You might notice some irritation and congestion in your nose or some drainage.  This is from the oxygen used during your procedure.  There is no need for concern and it should clear up in a day or so.  SYMPTOMS TO REPORT IMMEDIATELY:   Following lower endoscopy (colonoscopy or flexible sigmoidoscopy):  Excessive amounts of blood in the stool  Significant tenderness or worsening of abdominal pains  Swelling of the abdomen that is new, acute  Fever of 100F or higher  For urgent or emergent issues, a gastroenterologist can be reached at any hour by calling [phone].   DIET:  We do recommend a small meal at first, but then you may proceed to your regular diet.  Drink plenty of fluids but you should avoid alcoholic beverages for 24 hours.  ACTIVITY:  You should plan to take it easy for the rest of today and you should NOT DRIVE or use heavy machinery until tomorrow (because of the sedation medicines used during the test).     FOLLOW UP: Our staff will call the number listed on your records the next business day following your procedure to check on you and address any questions or concerns that you may have regarding the information given to you following your procedure. If we do not reach you, we will leave a message.  However, if you are feeling well and you are not experiencing any problems, there is no need to return our call.  We will assume that you have returned to your regular daily activities without incident.  If any biopsies were taken you will be contacted by phone or by letter within the next 1-3 weeks.  Please call us at [phone] if you have not heard about the biopsies in 3 weeks.    SIGNATURES/CONFIDENTIALITY: You and/or your care partner have signed paperwork which will be entered into your electronic medical record.  These signatures attest to the fact that that the information above on your After Visit Summary has been reviewed and is understood.  Full responsibility of the confidentiality of this discharge information lies with you and/or your care-partner. 

## 2017-07-16 NOTE — Op Note (Signed)
[facility] Patient Name: Julie Fischer Procedure Date: 07/16/2017 7:51 AM MRN: [id number] Endoscopist: Milus Banister , MD Age: 58 Referring MD:  Date of Birth: December 19, 1960 Gender: Female Account #: [id number] Procedure:                Colonoscopy Indications:              Screening in patient at increased risk: Family                            history of 1st-degree relative with colorectal                            cancer (mother diagnosed in her 77s) Medicines:                Monitored Anesthesia Care Procedure:                Pre-Anesthesia Assessment:                           - Prior to the procedure, a History and Physical                            was performed, and patient medications and                            allergies were reviewed. The patient's tolerance of                            previous anesthesia was also reviewed. The risks                            and benefits of the procedure and the sedation                            options and risks were discussed with the patient.                            All questions were answered, and informed consent                            was obtained. Prior Anticoagulants: The patient has                            taken no previous anticoagulant or antiplatelet                            agents. ASA Grade Assessment: II - A patient with                            mild systemic disease. After reviewing the risks                            and benefits, the patient was deemed in  satisfactory condition to undergo the procedure.                           After obtaining informed consent, the colonoscope                            was passed under direct vision. Throughout the                            procedure, the patient's blood pressure, pulse, and                            oxygen saturations were monitored continuously. The                            Model CF-HQ190L  [id number]) scope was introduced                            through the anus and advanced to the the cecum,                            identified by appendiceal orifice and ileocecal                            valve. The colonoscopy was performed without                            difficulty. The patient tolerated the procedure                            well. The quality of the bowel preparation was                            good. The ileocecal valve, appendiceal orifice, and                            rectum were photographed. Scope In: 7:53:45 AM Scope Out: 8:05:02 AM Scope Withdrawal Time: 0 hours 8 minutes 45 seconds  Total Procedure Duration: 0 hours 11 minutes 17 seconds  Findings:                 The entire examined colon appeared normal on direct                            and retroflexion views. Complications:            No immediate complications. Estimated blood loss:                            None. Estimated Blood Loss:     Estimated blood loss: none. Impression:               - The entire examined colon is normal on direct and                            retroflexion  views.                           - No polyps or cancers. Recommendation:           - Patient has a contact number available for                            emergencies. The signs and symptoms of potential                            delayed complications were discussed with the                            patient. Return to normal activities tomorrow.                            Written discharge instructions were provided to the                            patient.                           - Resume previous diet.                           - Continue present medications.                           - Repeat colonoscopy in 5 years for screening. Milus Banister, MD 07/16/2017 8:07:24 AM This report has been signed electronically.

## 2017-07-16 NOTE — Telephone Encounter (Signed)
I will send in diflucan to her pharmacy and if her symptoms do not clear up then I recommend she return for an office visit to be evaluated.

## 2017-07-16 NOTE — Telephone Encounter (Signed)
forwarding back to you

## 2017-07-17 ENCOUNTER — Telehealth: Payer: Self-pay

## 2017-07-17 NOTE — Telephone Encounter (Signed)
  Follow up Call-  Call back number 07/16/2017  Post procedure Call Back phone  # [phone]  Permission to leave phone message Yes  Some recent data might be hidden     Patient questions:  Do you have a fever, pain , or abdominal swelling? No. Pain Score  0 *  Have you tolerated food without any problems? Yes.    Have you been able to return to your normal activities? Yes.    Do you have any questions about your discharge instructions: Diet   No. Medications  No. Follow up visit  No.  Do you have questions or concerns about your Care? No.  Actions: * If pain score is 4 or above: No action needed, pain <4.

## 2017-07-19 NOTE — Progress Notes (Signed)
All insurance verified and her estimated out of pocket cost will be $210.00. Pt agreed to proceed. Injection added to her 03.20.2019 visit.

## 2017-07-23 ENCOUNTER — Encounter: Payer: Self-pay | Admitting: *Deleted

## 2017-07-24 NOTE — Progress Notes (Signed)
All insurance verified and pt made an appt to received

## 2017-08-06 NOTE — Progress Notes (Signed)
Chief Complaint  Patient presents with  . Diabetes    follow up on diabetes and prolia injection. Needs new meter. Needs refill on valtrex.  . Vaginitis    still has yeast infection.     Patient presents for 6 week follow-up on her diabetes. Her sugars were significantly higher on last check, so Wilder Glade was added to her metformin.   She switched to black coffee, cut out a lot of sugar in her diet. Starting walking with husband and her dog, 3 days/week (20-30 minutes).  Lab Results  Component Value Date   HGBA1C 8.3 07/01/2017   She reports sugars have been running: 140-242 in the mornings, often 150-170.   Bedtime, sugars are 153-320, mostly 150-220. This morning was 220, with no good explanation for the higher value.  She called 2/26 with vaginal itching, discomfort (externally) when voiding.  Never had any vaginal discharge.  She was treated with Diflucan 2/26, got a little, never completely resolved.  Currently still has itching (not as bad as 2/26, but still never went away). She used Monistat also, which seemed a little more helpful, but caused some stinging.  Needs  New accuchek fast-clik or other lancet device (dog chewed hers).  She also presents for her first Prolia injection.  She had worsening bone density despite bisphosphonate (alendronate).  Aware of risks/side effects, and need for q6 month injections (assuming no problems).  PMH, PSH, SH reviewed  Current Outpatient Medications on File Prior to Visit  Medication Sig Dispense Refill  . acetaminophen (TYLENOL) 500 MG tablet Take 1,500 mg every 6 (six) hours as needed by mouth for moderate pain or headache.     . lisinopril (PRINIVIL,ZESTRIL) 10 MG tablet Take 1 tablet (10 mg total) by mouth daily. 90 tablet 3  . metFORMIN (GLUCOPHAGE-XR) 500 MG 24 hr tablet Take  2 tablets daily (Patient taking differently: Take 1,000 mg daily by mouth. ) 180 tablet 1  . omeprazole (PRILOSEC OTC) 20 MG tablet Take 1 capsule up to twice  daily for the next 1-2 weeks (Patient taking differently: Take 20 mg daily as needed by mouth (for acid reflux or heartburn). ) 28 tablet 0  . PARoxetine (PAXIL) 40 MG tablet Take 1 tablet (40 mg total) by mouth every morning. 90 tablet 3  . RESTASIS 0.05 % ophthalmic emulsion INSTILL 1 DROP IN EACH EYE 2 TIMES DAILY  0  . simethicone (MYLICON) 694 MG chewable tablet Chew 125 mg every 6 (six) hours as needed by mouth for flatulence.    . simvastatin (ZOCOR) 20 MG tablet Take 1 tablet (20 mg total) by mouth at bedtime. 90 tablet 1  . zolpidem (AMBIEN CR) 12.5 MG CR tablet Take 1 tablet (12.5 mg total) by mouth at bedtime. 90 tablet 0  . cetirizine (ZYRTEC) 10 MG tablet Take 10 mg daily as needed by mouth for allergies.    Marland Kitchen gabapentin (NEURONTIN) 300 MG capsule Take 300 mg by mouth 2 (two) times daily.     . methocarbamol (ROBAXIN) 500 MG tablet Take 1 tablet (500 mg total) by mouth every 6 (six) hours as needed for muscle spasms. (Patient not taking: Reported on 08/07/2017) 80 tablet 0  . valACYclovir (VALTREX) 1000 MG tablet TAKE 2 TABLETS AT ONSET OF COLD SORE AND REPEAT IN 12 HOURS (4 TABLETS PER EPISODE OF COLD SORE) (Patient not taking: Reported on 07/01/2017) 28 tablet 1   Current Facility-Administered Medications on File Prior to Visit  Medication Dose Route Frequency Provider Last  Rate Last Dose  . 0.9 %  sodium chloride infusion  500 mL Intravenous Once Milus Banister, MD       Taking Wilder Glade 5mg  prior to today's visit  Allergies  Allergen Reactions  . Heparin Other (See Comments)    Low platelets  . Niacin And Related Other (See Comments)    Blotchy/itchy  . Tessalon [Benzonatate] Diarrhea  . Tramadol Other (See Comments)    Dizziness (lasted 24 hours after taking 2 doses)  . Penicillins Rash and Other (See Comments)    Has patient had a PCN reaction causing immediate rash, facial/tongue/throat swelling, SOB or lightheadedness with hypotension: Unknown Has patient had a PCN  reaction causing severe rash involving mucus membranes or skin necrosis: Unknown Has patient had a PCN reaction that required hospitalization: No Has patient had a PCN reaction occurring within the last 10 years: No If all of the above answers are "NO", then may proceed with Cephalosporin use.     ROS: no fever, chills, headaches, dizziness, chest pain, shortness of breath, vision changes, polydipsia, polyuria, bleeding, bruising, rash.  +itching vaginally.   No dysuria, no vaginal discharge. See HPI. Moods are good.   PHYSICAL EXAM:  BP 124/74   Pulse 76   Ht 4' 8.5" (1.435 m)   Wt 151 lb 9.6 oz (68.8 kg)   BMI 33.39 kg/m   Wt Readings from Last 3 Encounters:  08/07/17 151 lb 9.6 oz (68.8 kg)  07/16/17 154 lb (69.9 kg)  07/01/17 156 lb 3.2 oz (70.9 kg)   Well appearing, pleasant, obese female in no distress HEENT: conjunctiva and sclera are clear, EOMI, OP clear Neck: no lymphadenopathy, thyromegaly, carotid bruit or mass Heart: regular rate and rhythm Lungs: clear bilaterally Back: no spinal or CVA tenderness Abdomen: soft, obese, nontender, no mass External genitalia:  Externally normal, no lesions, redness, rash. No swelling, BUS normal. +erythema at introitus and labia minora, but no rash or discharge Extremities: no edema Psych: normal mood, affect, hygiene and grooming Neuro: alert and oriented, cranial nerves intact normal gait    ASSESSMENT/PLAN:  Poorly controlled diabetes mellitus (HCC) - sugars still high on low dose Farxiga, w/ yeast infxn--change to Trulicity. Cont Metformin. Risks/SE reviewed - Plan: Dulaglutide (TRULICITY) 2.54 YH/0.6CB SOPN  Osteoporosis, unspecified osteoporosis type, unspecified pathological fracture presence - first Prolia injection today.  Risks/SE reviewed. Discussed importance of Ca, D, weight-bearing exercise. - Plan: denosumab (PROLIA) injection 60 mg  Yeast vaginitis - partial response to 1 dose of Farxiga, repeat; repeat in a wk  if needed. Hold simva while taking. - Plan: fluconazole (DIFLUCAN) 150 MG tablet   Suspect yeast infection due to itching fairly steadily since starting the farxiga. Will treat with additional diflucan (hold simvastatin while using) Repeat in 1 week only if not better.  Prolia injection today  Since you didn't get a good response to your sugars with the [place], and are having yeast infections, so may not tolerate the higher dose, let's change from [place] to a weekly injectable medication. I recommend waiting to start for a week (just in case there are side effects to the Prolia, you can know which medication caused what).  We are starting you on once weekly Trulicity. We are starting you at the lower dose, and at your follow up will increase to the higher dose if you are tolerating it (and sugars remain high). You are being given 2 samples, and prescription was sent to the local pharmacy for you--be sure to bring  the savings card.  Go ahead and continue the farxiga until you can start the injection, but then stop it--the plan is to [facility] to the injection, not be on both right now.  Please continue to work hard on limiting the sugar in your diet, and getting regular exercise.    CPE in November  Med check in 6 weeks

## 2017-08-07 ENCOUNTER — Ambulatory Visit: Payer: 59 | Admitting: Family Medicine

## 2017-08-07 ENCOUNTER — Encounter: Payer: Self-pay | Admitting: Family Medicine

## 2017-08-07 ENCOUNTER — Ambulatory Visit (INDEPENDENT_AMBULATORY_CARE_PROVIDER_SITE_OTHER): Payer: 59 | Admitting: Family Medicine

## 2017-08-07 VITALS — BP 124/74 | HR 76 | Ht <= 58 in | Wt 151.6 lb

## 2017-08-07 DIAGNOSIS — E1165 Type 2 diabetes mellitus with hyperglycemia: Secondary | ICD-10-CM

## 2017-08-07 DIAGNOSIS — B3731 Acute candidiasis of vulva and vagina: Secondary | ICD-10-CM

## 2017-08-07 DIAGNOSIS — M810 Age-related osteoporosis without current pathological fracture: Secondary | ICD-10-CM

## 2017-08-07 DIAGNOSIS — B373 Candidiasis of vulva and vagina: Secondary | ICD-10-CM

## 2017-08-07 MED ORDER — DULAGLUTIDE 0.75 MG/0.5ML ~~LOC~~ SOAJ: 0.7500 mg | SUBCUTANEOUS | 1 refills | Status: DC

## 2017-08-07 MED ORDER — FLUCONAZOLE 150 MG PO TABS
150.0000 mg | ORAL_TABLET | Freq: Once | ORAL | 0 refills | Status: AC
Start: 2017-08-07 — End: 2017-08-07

## 2017-08-07 MED ORDER — DENOSUMAB 60 MG/ML ~~LOC~~ SOLN
60.0000 mg | Freq: Once | SUBCUTANEOUS | Status: AC
  Administered 2017-08-07: 60 mg via SUBCUTANEOUS

## 2017-08-07 NOTE — Patient Instructions (Signed)
  Since you didn't get a good response to your sugars with the Wilder Glade, and are having yeast infections, so may not tolerate the higher dose, let's change from [place] to a weekly injectable medication. I recommend waiting to start for a week (just in case there are side effects to the Prolia, you can know which medication caused what).  We are starting you on once weekly Trulicity. We are starting you at the lower dose, and at your follow up will increase to the higher dose if you are tolerating it (and sugars remain high). You are being given 2 samples, and prescription was sent to the local pharmacy for you--be sure to bring the savings card.  Go ahead and continue the farxiga until you can start the injection, but then stop it--the plan is to [facility] to the injection, not be on both right now.  Please continue to work hard on limiting the sugar in your diet, and getting regular exercise.

## 2017-08-29 ENCOUNTER — Other Ambulatory Visit: Payer: Self-pay | Admitting: Family Medicine

## 2017-08-29 DIAGNOSIS — E119 Type 2 diabetes mellitus without complications: Secondary | ICD-10-CM

## 2017-09-17 NOTE — Progress Notes (Signed)
Chief Complaint  Patient presents with  . Diabetes   Patient presents to f/u on diabetes.  She was changed from [place] to Trulicity at her visit last month (had yeast infection, and sugars remained elevated). She was started on the lower dose. She is tolerating this without side effects. She continues on 1038m of metformin daily. Sugars have been running 76-115 fasting since the beginning of April (mostly 95-100). 2 hours after dinner/bedtime sugars are 103-199, mostly 130-140. No hypoglycemic symptoms. Last A1c was 06/2017: Lab Results  Component Value Date   HGBA1C 8.3 07/01/2017   OSA--doing well on CPAP. Compliant with daily use. Feels refreshed in the mornings.  Hypertension follow-up: Hasn't been checking blood pressures at home recently. Denies dizziness, headaches, chest pain. Denies side effects of medications, no cough.   Depression: Moods seem to be well controlled on the current dose of paroxetine. Moods improved quitting her job. Denies SI/HI. Denies side effects of medications.   Hyperlipidemia follow-up: Patient is reportedly following a low-fat, low cholesterol diet. Compliant with medications (simvastatin and fish oil) and denies medication side effects.   Osteoporosis: She is tolerating Prolia without side effects. Last DEXA was 05/2017, T-2.7 at spine. Changed from fosamax to Prolia after that study.  Insomnia: requires Ambien CR nightly. No unusual behavior.  Sometimes she gets hungry about 15 minutes later and asks her husband for food--sometimes she falls asleep before he gets it. She has asked him to NOT bring her food, since she really doesn't recall eating it, but he still does. This isn't occurring as often.  The Ambien is effective in treating the insomnia--she doesn't sleep at all if she doesn't take it, "the brain won't shut up".  PMH, PSH, SH reviewed  Outpatient Encounter Medications as of 09/18/2017  Medication Sig Note  . cetirizine (ZYRTEC) 10 MG  tablet Take 10 mg daily as needed by mouth for allergies.   . Dulaglutide (TRULICITY) 01.44MYJ/8.5UDSOPN Inject 0.75 mg into the skin every 7 (seven) days.   .Marland Kitchenlisinopril (PRINIVIL,ZESTRIL) 10 MG tablet Take 1 tablet (10 mg total) by mouth daily.   . metFORMIN (GLUCOPHAGE-XR) 500 MG 24 hr tablet TAKE 2 TABLETS DAILY   . omeprazole (PRILOSEC OTC) 20 MG tablet Take 1 capsule up to twice daily for the next 1-2 weeks (Patient taking differently: Take 20 mg daily as needed by mouth (for acid reflux or heartburn). )   . PARoxetine (PAXIL) 40 MG tablet Take 1 tablet (40 mg total) by mouth every morning.   . RESTASIS 0.05 % ophthalmic emulsion INSTILL 1 DROP IN EACH EYE 2 TIMES DAILY   . simethicone (MYLICON) 1149MG chewable tablet Chew 125 mg every 6 (six) hours as needed by mouth for flatulence.   . simvastatin (ZOCOR) 20 MG tablet Take 1 tablet (20 mg total) by mouth at bedtime.   . valACYclovir (VALTREX) 1000 MG tablet TAKE 2 TABLETS AT ONSET OF COLD SORE AND REPEAT IN 12 HOURS (4 TABLETS PER EPISODE OF COLD SORE)   . zolpidem (AMBIEN CR) 12.5 MG CR tablet Take 1 tablet (12.5 mg total) by mouth at bedtime.   .Marland Kitchenacetaminophen (TYLENOL) 500 MG tablet Take 1,500 mg every 6 (six) hours as needed by mouth for moderate pain or headache.    . gabapentin (NEURONTIN) 300 MG capsule Take 300 mg by mouth 2 (two) times daily.  09/18/2017: Uses prn (given for her left knee, after surgery)  . methocarbamol (ROBAXIN) 500 MG tablet Take 1 tablet (500  mg total) by mouth every 6 (six) hours as needed for muscle spasms. (Patient not taking: Reported on 08/07/2017)    Facility-Administered Encounter Medications as of 09/18/2017  Medication  . 0.9 %  sodium chloride infusion   ROS: no fever, chills, URI symptoms, chest pain, shortness of breath, nausea, vomiting, diarrhea, bleeding, bruising, rash, urinary complaints, vaginal discharge/itch. Moods are good   PHYSICAL EXAM:  BP 110/74   Pulse 68   Ht 4' 8.5" (1.435 m)    Wt 148 lb (67.1 kg)   BMI 32.60 kg/m   Wt Readings from Last 3 Encounters:  09/18/17 148 lb (67.1 kg)  08/07/17 151 lb 9.6 oz (68.8 kg)  07/16/17 154 lb (69.9 kg)   Well appearing, pleasant, obese female in no distress HEENT: conjunctiva and sclera are clear, EOMI, OP clear Neck: no lymphadenopathy, thyromegaly, carotid bruit or mass Heart: regular rate and rhythm Lungs: clear bilaterally Back: no spinal or CVA tenderness Abdomen: soft, obese, nontender, no mass Extremities: no edema Psych: normal mood, affect, hygiene and grooming Neuro: alert and oriented, cranial nerves intact normal gait  Lab Results  Component Value Date   HGBA1C 6.4 09/18/2017    ASSESSMENT/PLAN:  Type 2 diabetes mellitus without complication, without long-term current use of insulin (HCC) - improved control on low dose Trulicity and metformin. Continue at current doses - Plan: HgB A1c, Dulaglutide (TRULICITY) 5.79 JK/8.2SU SOPN  Essential hypertension, benign - well controlled  OSA on CPAP  Depression, major, in remission ([place])  Osteoporosis, unspecified osteoporosis type, unspecified pathological fracture presence - continue q6 mo Prolia injections, Ca, D, weight-bearing exercise.  Medication monitoring encounter - Plan: Lipid panel, Comprehensive metabolic panel  Type 2 diabetes mellitus without complication, without long-term current use of insulin (HCC) - Plan: HgB A1c, Dulaglutide (TRULICITY) 0.15 IF/5.3PH SOPN  Pure hypercholesterolemia - Plan: simvastatin (ZOCOR) 20 MG tablet  Insomnia, unspecified type - chronic; well controlled with ambien CR, without side effects - Plan: zolpidem (AMBIEN CR) 12.5 MG CR tablet   A1c Fasting lipids, c-met She is not fasting today, will return for fasting labs   F/u as scheduled in November, fasting labs prior (c-met, CBC, lipid, A1c, TSH, urine microalb/Cr--orders to be entered after she returns from fasting labs, to ensure not all released)

## 2017-09-18 ENCOUNTER — Ambulatory Visit (INDEPENDENT_AMBULATORY_CARE_PROVIDER_SITE_OTHER): Payer: 59 | Admitting: Family Medicine

## 2017-09-18 ENCOUNTER — Encounter: Payer: Self-pay | Admitting: Family Medicine

## 2017-09-18 VITALS — BP 110/74 | HR 68 | Ht <= 58 in | Wt 148.0 lb

## 2017-09-18 DIAGNOSIS — Z5181 Encounter for therapeutic drug level monitoring: Secondary | ICD-10-CM | POA: Diagnosis not present

## 2017-09-18 DIAGNOSIS — M810 Age-related osteoporosis without current pathological fracture: Secondary | ICD-10-CM

## 2017-09-18 DIAGNOSIS — E119 Type 2 diabetes mellitus without complications: Secondary | ICD-10-CM | POA: Diagnosis not present

## 2017-09-18 DIAGNOSIS — Z9989 Dependence on other enabling machines and devices: Secondary | ICD-10-CM | POA: Diagnosis not present

## 2017-09-18 DIAGNOSIS — I10 Essential (primary) hypertension: Secondary | ICD-10-CM

## 2017-09-18 DIAGNOSIS — G4733 Obstructive sleep apnea (adult) (pediatric): Secondary | ICD-10-CM

## 2017-09-18 DIAGNOSIS — G4700 Insomnia, unspecified: Secondary | ICD-10-CM | POA: Diagnosis not present

## 2017-09-18 DIAGNOSIS — F325 Major depressive disorder, single episode, in full remission: Secondary | ICD-10-CM

## 2017-09-18 DIAGNOSIS — E7800 Pure hypercholesterolemia, unspecified: Secondary | ICD-10-CM | POA: Diagnosis not present

## 2017-09-18 LAB — POCT GLYCOSYLATED HEMOGLOBIN (HGB A1C): Hemoglobin A1C: 6.4

## 2017-09-18 MED ORDER — ZOLPIDEM TARTRATE ER 12.5 MG PO TBCR: 12.5000 mg | EXTENDED_RELEASE_TABLET | Freq: Every day | ORAL | 0 refills | Status: DC

## 2017-09-18 MED ORDER — SIMVASTATIN 20 MG PO TABS: 20.0000 mg | ORAL_TABLET | Freq: Every day | ORAL | 1 refills | Status: DC

## 2017-09-18 MED ORDER — DULAGLUTIDE 0.75 MG/0.5ML ~~LOC~~ SOAJ: 0.7500 mg | SUBCUTANEOUS | 5 refills | Status: DC

## 2017-09-18 NOTE — Patient Instructions (Signed)
Keep up the good work. Periodically check your blood pressure, especially if you are feeling dizzy. Let us know if they are consistently <719 systolic and are having dizziness.  Continue current medications, healthy diet, regular exercise.

## 2017-09-19 ENCOUNTER — Other Ambulatory Visit: Payer: 59

## 2017-09-19 ENCOUNTER — Other Ambulatory Visit: Payer: Self-pay | Admitting: *Deleted

## 2017-09-19 DIAGNOSIS — Z0000 Encounter for general adult medical examination without abnormal findings: Secondary | ICD-10-CM

## 2017-09-19 DIAGNOSIS — E119 Type 2 diabetes mellitus without complications: Secondary | ICD-10-CM

## 2017-09-19 DIAGNOSIS — Z5181 Encounter for therapeutic drug level monitoring: Secondary | ICD-10-CM

## 2017-09-19 DIAGNOSIS — E785 Hyperlipidemia, unspecified: Secondary | ICD-10-CM

## 2017-09-20 LAB — LIPID PANEL
CHOLESTEROL TOTAL: 136 mg/dL (ref 100–199)
Chol/HDL Ratio: 3.2 ratio (ref 0.0–4.4)
HDL: 43 mg/dL (ref >39)
LDL CALC: 66 mg/dL (ref 0–99)
TRIGLYCERIDES: 134 mg/dL (ref 0–149)
VLDL CHOLESTEROL CAL: 27 mg/dL (ref 5–40)

## 2017-09-20 LAB — COMPREHENSIVE METABOLIC PANEL
ALK PHOS: 52 IU/L (ref 39–117)
ALT: 30 IU/L (ref 0–32)
AST: 24 IU/L (ref 0–40)
Albumin/Globulin Ratio: 2 (ref 1.2–2.2)
Albumin: 4.5 g/dL (ref 3.5–5.5)
BUN / CREAT RATIO: 24 — AB (ref 9–23)
BUN: 13 mg/dL (ref 6–24)
Bilirubin Total: 0.3 mg/dL (ref 0.0–1.2)
CO2: 23 mmol/L (ref 20–29)
CREATININE: 0.55 mg/dL — AB (ref 0.57–1.00)
Calcium: 9.8 mg/dL (ref 8.7–10.2)
Chloride: 104 mmol/L (ref 96–106)
GFR calc Af Amer: 120 mL/min/{1.73_m2} (ref >59)
GFR calc non Af Amer: 104 mL/min/{1.73_m2} (ref >59)
Globulin, Total: 2.2 g/dL (ref 1.5–4.5)
Glucose: 101 mg/dL — ABNORMAL HIGH (ref 65–99)
Potassium: 4.7 mmol/L (ref 3.5–5.2)
Sodium: 142 mmol/L (ref 134–144)
Total Protein: 6.7 g/dL (ref 6.0–8.5)

## 2017-10-10 DIAGNOSIS — G4733 Obstructive sleep apnea (adult) (pediatric): Secondary | ICD-10-CM | POA: Diagnosis not present

## 2017-10-11 DIAGNOSIS — M1711 Unilateral primary osteoarthritis, right knee: Secondary | ICD-10-CM | POA: Diagnosis not present

## 2017-10-11 DIAGNOSIS — M1712 Unilateral primary osteoarthritis, left knee: Secondary | ICD-10-CM | POA: Diagnosis not present

## 2017-11-27 ENCOUNTER — Other Ambulatory Visit: Payer: Self-pay | Admitting: Family Medicine

## 2017-11-27 DIAGNOSIS — E119 Type 2 diabetes mellitus without complications: Secondary | ICD-10-CM

## 2017-12-23 NOTE — H&P (Signed)
TOTAL KNEE ADMISSION H&P  Patient is being admitted for right total knee arthroplasty.  Subjective:  Chief Complaint:right knee pain.  HPI: Julie Fischer, 57 y.o. female, has a history of pain and functional disability in the right knee due to arthritis and has failed non-surgical conservative treatments for greater than 12 weeks to includeNSAID's and/or analgesics and activity modification.  Onset of symptoms was gradual, starting several years ago with gradually worsening course since that time. The patient noted no past surgery on the right knee(s).  Patient currently rates pain in the right knee(s) at 6 out of 10 with activity. Patient has pain that interferes with activities of daily living, crepitus, joint swelling and instability.  Patient has evidence of medial bone-on-bone arthritis. Her previous lateral shows bone-on-bone in the patellofemoral compartment by imaging studies.There is no active infection.  Patient Active Problem List   Diagnosis Date Noted  . Family hx of colon cancer 06/05/2017  . S/P TKR (total knee replacement), left 06/05/2017  . OA (osteoarthritis) of knee 04/08/2017  . History of bariatric surgery 07/29/2013  . Obesity (BMI 30-39.9) 07/29/2013  . Osteoporosis 01/28/2013  . Allergic rhinitis 01/28/2013  . Anemia, iron deficiency 07/30/2012  . Impaired fasting glucose 12/10/2011  . Essential hypertension, benign 12/10/2011  . Pure hypercholesterolemia 11/15/2010  . Insomnia 11/15/2010  . Depression, major, in remission ([place]) 11/15/2010  . Osteopenia 11/15/2010  . History of breast cancer 11/15/2010   Past Medical History:  Diagnosis Date  . Allergy    fall seasonal  . Anxiety   . Blood transfusion    Platelet transfusion when on heparin  . Breast cancer ([place]) 2001   R breast(lumpectomy,chemo,radiation,tamoxifen)  . C. difficile diarrhea 02/2016   treated with Flagyl  . Complication of anesthesia    gets Jillyn Ledger going under she has been told  .  Depression    treated  . Diabetes mellitus    type 2  . GERD (gastroesophageal reflux disease)   . Hyperlipidemia   . Hypertension    resolved after weight loss surgery; recurred 2013  . Insomnia chronic  . Iron deficiency    h/o  . Microalbuminuria    h/o  . OA (osteoarthritis) of knee   . Osteoporosis   . Personal history of chemotherapy   . Personal history of radiation therapy   . Plantar fasciitis (07') DrRegal   s/p surgical release 01/2011  . Sleep apnea    resolved after weight loss surgery; recurrent with weight gain; on CPAP  . Vitamin D deficiency     Past Surgical History:  Procedure Laterality Date  . ABDOMINAL HYSTERECTOMY  2000   and RSO(endometriosis)  . APPENDECTOMY    . BREAST LUMPECTOMY Right  11/1999  . COLONOSCOPY  2002, 2012  . cuboid stress fracture Right 12/2002  . FOOT SURGERY Left 2008  . GASTRIC ROUX-EN-Y  (DrNewman) 3/04  . left shoulder fracture repair  (DrMortensen) 12/05  . MYRINGOTOMY     tubes B/L, multiple sets  . plantar fasciitis release Right 01/30/11   Dr. Paulla Dolly  . TONSILLECTOMY AND ADENOIDECTOMY    . TOTAL KNEE ARTHROPLASTY Left 04/08/2017   Procedure: LEFT TOTAL KNEE ARTHROPLASTY;  Surgeon: Gaynelle Arabian, MD;  Location: [facility];  Service: Orthopedics;  Laterality: Left;    Current Facility-Administered Medications  Medication Dose Route Frequency Provider Last Rate Last Dose  . 0.9 %  sodium chloride infusion  500 mL Intravenous Once Milus Banister, MD  Current Outpatient Medications  Medication Sig Dispense Refill Last Dose  . acetaminophen (TYLENOL) 500 MG tablet Take 1,500 mg every 6 (six) hours as needed by mouth for moderate pain or headache.    Not Taking  . cetirizine (ZYRTEC) 10 MG tablet Take 10 mg daily as needed by mouth for allergies.   Taking  . Dulaglutide (TRULICITY) 2.84 XL/2.4MW SOPN Inject 0.75 mg into the skin every 7 (seven) days. 4 pen 5   . gabapentin (NEURONTIN) 300 MG capsule Take 300 mg by mouth 2  (two) times daily.    Not Taking  . lisinopril (PRINIVIL,ZESTRIL) 10 MG tablet Take 1 tablet (10 mg total) by mouth daily. 90 tablet 3 Taking  . metFORMIN (GLUCOPHAGE-XR) 500 MG 24 hr tablet TAKE 2 TABLETS DAILY 180 tablet 0   . methocarbamol (ROBAXIN) 500 MG tablet Take 1 tablet (500 mg total) by mouth every 6 (six) hours as needed for muscle spasms. (Patient not taking: Reported on 08/07/2017) 80 tablet 0 Not Taking  . omeprazole (PRILOSEC OTC) 20 MG tablet Take 1 capsule up to twice daily for the next 1-2 weeks (Patient taking differently: Take 20 mg daily as needed by mouth (for acid reflux or heartburn). ) 28 tablet 0 Taking  . PARoxetine (PAXIL) 40 MG tablet Take 1 tablet (40 mg total) by mouth every morning. 90 tablet 3 Taking  . RESTASIS 0.05 % ophthalmic emulsion INSTILL 1 DROP IN EACH EYE 2 TIMES DAILY  0 Taking  . simethicone (MYLICON) 102 MG chewable tablet Chew 125 mg every 6 (six) hours as needed by mouth for flatulence.   Taking  . simvastatin (ZOCOR) 20 MG tablet Take 1 tablet (20 mg total) by mouth at bedtime. 90 tablet 1   . valACYclovir (VALTREX) 1000 MG tablet TAKE 2 TABLETS AT ONSET OF COLD SORE AND REPEAT IN 12 HOURS (4 TABLETS PER EPISODE OF COLD SORE) 28 tablet 1 Taking  . zolpidem (AMBIEN CR) 12.5 MG CR tablet Take 1 tablet (12.5 mg total) by mouth at bedtime. 90 tablet 0    Allergies  Allergen Reactions  . Heparin Other (See Comments)    Low platelets  . Niacin And Related Other (See Comments)    Blotchy/itchy  . Tessalon [Benzonatate] Diarrhea  . Tramadol Other (See Comments)    Dizziness (lasted 24 hours after taking 2 doses)  . Penicillins Rash and Other (See Comments)    Has patient had a PCN reaction causing immediate rash, facial/tongue/throat swelling, SOB or lightheadedness with hypotension: Unknown Has patient had a PCN reaction causing severe rash involving mucus membranes or skin necrosis: Unknown Has patient had a PCN reaction that required  hospitalization: No Has patient had a PCN reaction occurring within the last 10 years: No If all of the above answers are "NO", then may proceed with Cephalosporin use.     Social History   Tobacco Use  . Smoking status: Former Smoker    Last attempt to quit: 01/28/1996    Years since quitting: 21.9  . Smokeless tobacco: Never Used  Substance Use Topics  . Alcohol use: Yes    Comment: 1/2 beer every 1-2 months    Family History  Problem Relation Age of Onset  . Dementia Mother   . Stroke Mother 47       due to aneurysm  . Macular degeneration Mother   . Colon cancer Mother 55  . Lung cancer Father   . Hyperlipidemia Sister   . Hyperlipidemia Brother   .  Diabetes Brother   . Hypertension Brother   . Hepatitis C Brother   . Alcohol abuse Brother   . Drug abuse Brother   . Diabetes Maternal Grandmother   . Hyperlipidemia Sister   . Breast cancer Cousin   . Breast cancer Cousin      Review of Systems  Constitutional: Negative for chills and fever.  HENT: Negative for congestion, sore throat and tinnitus.   Eyes: Negative for double vision, photophobia and pain.  Respiratory: Negative for cough, shortness of breath and wheezing.   Cardiovascular: Negative for chest pain, palpitations and orthopnea.  Gastrointestinal: Negative for heartburn, nausea and vomiting.  Genitourinary: Negative for dysuria, frequency and urgency.  Musculoskeletal: Positive for joint pain.  Neurological: Negative for dizziness, weakness and headaches.  Psychiatric/Behavioral: Negative for depression.    Objective:  Physical Exam  Well nourished and well developed. General: Alert and oriented x3, cooperative and pleasant, no acute distress. Head: normocephalic, atraumatic, neck supple. Eyes: EOMI. Respiratory: breath sounds clear in all fields, no wheezing, rales, or rhonchi. Cardiovascular: Regular rate and rhythm, no murmurs, gallops or rubs.  Abdomen: non-tender to palpation and soft,  normoactive bowel sounds. Musculoskeletal: Left knee exam: Nonantalgic gait and walking without assistance of a cane.No effusion. No tenderness.Range of motion: 0 - 125 degrees.Slight valgus laxity in full extension, otherwise stable. Right Knee Exam:  Varus deformity.No effusion. Range of motion is 5-125 degrees. Marked crepitus on range of motion of the knee. Some medial greater than lateral joint line tenderness. Stable knee. Calves soft and nontender. Motor function intact in LE. Strength 5/5 LE bilaterally. Neuro: Distal pulses 2+. Sensation to light touch intact in LE.  Vital signs in last 24 hours: Blood pressure: 108/64 mmHg Pulse: 68 bpm  Labs:   Estimated body mass index is 32.6 kg/m as calculated from the following:   Height as of 09/18/17: 4' 8.5" (1.435 m).   Weight as of 09/18/17: 67.1 kg (148 lb).   Imaging Review  Plain radiographs demonstrate severe degenerative joint disease of the right knee(s). The overall alignment isneutral. The bone quality appears to be adequate for age and reported activity level.   Preoperative templating of the joint replacement has been completed, documented, and submitted to the Operating Room personnel in order to optimize intra-operative equipment management.   Anticipated LOS equal to or greater than 2 midnights due to - Age 18 and older with one or more of the following:  - Obesity  - Expected need for hospital services (PT, OT, Nursing) required for safe  discharge  - Anticipated need for postoperative skilled nursing care or inpatient rehab  - Active co-morbidities: Diabetes OR   - Unanticipated findings during/Post Surgery: None  - Patient is a high risk of re-admission due to: None     Assessment/Plan:  End stage arthritis, right knee   The patient history, physical examination, clinical judgment of the provider and imaging studies are consistent with end stage degenerative joint disease of the right knee(s) and total  knee arthroplasty is deemed medically necessary. The treatment options including medical management, injection therapy arthroscopy and arthroplasty were discussed at length. The risks and benefits of total knee arthroplasty were presented and reviewed. The risks due to aseptic loosening, infection, stiffness, patella tracking problems, thromboembolic complications and other imponderables were discussed. The patient acknowledged the explanation, agreed to proceed with the plan and consent was signed. Patient is being admitted for inpatient treatment for surgery, pain control, PT, OT, prophylactic antibiotics, VTE prophylaxis,  progressive ambulation and ADL's and discharge planning. The patient is planning to be discharged home with outpatient physical therapy.   Therapy Plans: outpatient therapy at [facility] Disposition: Home with husband Planned DVT Prophylaxis: Aspirin 325 mg BID DME needed: None PCP: Dr. Tomi Bamberger (medical clearance provided) TXA: IV Allergies: PCN (hives), heparin (anaphylaxis)  - Patient was instructed on what medications to stop prior to surgery. - Follow-up visit in 2 weeks with Dr. Wynelle Link - Begin physical therapy following surgery - Pre-operative lab work as pre-surgical testing - Prescriptions will be provided in hospital at time of discharge  Theresa Duty, PA-C Orthopedic Surgery [facility] [place]

## 2017-12-27 NOTE — Patient Instructions (Addendum)
Tashia Leiterman Zettel  12/27/2017   Your procedure is scheduled on: Monday 01/06/2018  Report to [facility] Main  Entrance              Report to admitting at  0905 AM    Call this number if you have problems the morning of surgery [phone]    Remember: Do not eat food or drink liquids :After Midnight.              Please bring CPAP mask and tubing with you to the hospital!   How to Manage Your Diabetes Before and After Surgery  Why is it important to control my blood sugar before and after surgery? . Improving blood sugar levels before and after surgery helps healing and can limit problems. . A way of improving blood sugar control is eating a healthy diet by: o  Eating less sugar and carbohydrates o  Increasing activity/exercise o  Talking with your doctor about reaching your blood sugar goals . High blood sugars (greater than 180 mg/dL) can raise your risk of infections and slow your recovery, so you will need to focus on controlling your diabetes during the weeks before surgery. . Make sure that the doctor who takes care of your diabetes knows about your planned surgery including the date and location.  How do I manage my blood sugar before surgery? . Check your blood sugar at least 4 times a day, starting 2 days before surgery, to make sure that the level is not too high or low. o Check your blood sugar the morning of your surgery when you wake up and every 2 hours until you get to the Short Stay unit. . If your blood sugar is less than 70 mg/dL, you will need to treat for low blood sugar: o Do not take insulin. o Treat a low blood sugar (less than 70 mg/dL) with  cup of clear juice (cranberry or apple), 4 glucose tablets, OR glucose gel. o Recheck blood sugar in 15 minutes after treatment (to make sure it is greater than 70 mg/dL). If your blood sugar is not greater than 70 mg/dL on recheck, call [phone] for further instructions. . Report your  blood sugar to the short stay nurse when you get to Short Stay.  . If you are admitted to the hospital after surgery: o Your blood sugar will be checked by the staff and you will probably be given insulin after surgery (instead of oral diabetes medicines) to make sure you have good blood sugar levels. o The goal for blood sugar control after surgery is 80-180 mg/dL.   WHAT DO I DO ABOUT MY DIABETES MEDICATION?   The day before surgery, Take Metformin as usual.  . Do not take oral diabetes medicines (pills) the morning of surgery.   . The day of surgery, do not take other diabetes injectables, including Byetta (exenatide), Bydureon (exenatide ER), Victoza (liraglutide), or Trulicity (dulaglutide).    Take these medicines the morning of surgery with A SIP OF WATER: eye drops   DO NOT TAKE ANY DIABETIC MEDICATIONS DAY OF YOUR SURGERY!                               You may not have any metal on your body including hair pins and  piercings  Do not wear jewelry, make-up, lotions, powders or perfumes, deodorant             Do not wear nail polish.  Do not shave  48 hours prior to surgery.                 Do not bring valuables to the hospital. [place].  Contacts, dentures or bridgework may not be worn into surgery.  Leave suitcase in the car. After surgery it may be brought to your room.                  Please read over the following fact sheets you were given: _____________________________________________________________________             [facility] - Preparing for Surgery Before surgery, you can play an important role.  Because skin is not sterile, your skin needs to be as free of germs as possible.  You can reduce the number of germs on your skin by washing with CHG (chlorahexidine gluconate) soap before surgery.  CHG is an antiseptic cleaner which kills germs and bonds with the skin to continue killing germs even  after washing. Please DO NOT use if you have an allergy to CHG or antibacterial soaps.  If your skin becomes reddened/irritated stop using the CHG and inform your nurse when you arrive at Short Stay. Do not shave (including legs and underarms) for at least 48 hours prior to the first CHG shower.  You may shave your face/neck. Please follow these instructions carefully:  1.  Shower with CHG Soap the night before surgery and the  morning of Surgery.  2.  If you choose to wash your hair, wash your hair first as usual with your  normal  shampoo.  3.  After you shampoo, rinse your hair and body thoroughly to remove the  shampoo.                           4.  Use CHG as you would any other liquid soap.  You can apply chg directly  to the skin and wash                       Gently with a scrungie or clean washcloth.  5.  Apply the CHG Soap to your body ONLY FROM THE NECK DOWN.   Do not use on face/ open                           Wound or open sores. Avoid contact with eyes, ears mouth and genitals (private parts).                       Wash face,  Genitals (private parts) with your normal soap.             6.  Wash thoroughly, paying special attention to the area where your surgery  will be performed.  7.  Thoroughly rinse your body with warm water from the neck down.  8.  DO NOT shower/wash with your normal soap after using and rinsing off  the CHG Soap.                9.  Pat yourself dry with a clean towel.  10.  Wear clean pajamas.            11.  Place clean sheets on your bed the night of your first shower and do not  sleep with pets. Day of Surgery : Do not apply any lotions/deodorants the morning of surgery.  Please wear clean clothes to the hospital/surgery center.  FAILURE TO FOLLOW THESE INSTRUCTIONS MAY RESULT IN THE CANCELLATION OF YOUR SURGERY PATIENT SIGNATURE_________________________________  NURSE  SIGNATURE__________________________________  ________________________________________________________________________   Adam Phenix  An incentive spirometer is a tool that can help keep your lungs clear and active. This tool measures how well you are filling your lungs with each breath. Taking long deep breaths may help reverse or decrease the chance of developing breathing (pulmonary) problems (especially infection) following:  A long period of time when you are unable to move or be active. BEFORE THE PROCEDURE   If the spirometer includes an indicator to show your best effort, your nurse or respiratory therapist will set it to a desired goal.  If possible, sit up straight or lean slightly forward. Try not to slouch.  Hold the incentive spirometer in an upright position. INSTRUCTIONS FOR USE  1. Sit on the edge of your bed if possible, or sit up as far as you can in bed or on a chair. 2. Hold the incentive spirometer in an upright position. 3. Breathe out normally. 4. Place the mouthpiece in your mouth and seal your lips tightly around it. 5. Breathe in slowly and as deeply as possible, raising the piston or the ball toward the top of the column. 6. Hold your breath for 3-5 seconds or for as long as possible. Allow the piston or ball to fall to the bottom of the column. 7. Remove the mouthpiece from your mouth and breathe out normally. 8. Rest for a few seconds and repeat Steps 1 through 7 at least 10 times every 1-2 hours when you are awake. Take your time and take a few normal breaths between deep breaths. 9. The spirometer may include an indicator to show your best effort. Use the indicator as a goal to work toward during each repetition. 10. After each set of 10 deep breaths, practice coughing to be sure your lungs are clear. If you have an incision (the cut made at the time of surgery), support your incision when coughing by placing a pillow or rolled up towels firmly  against it. Once you are able to get out of bed, walk around indoors and cough well. You may stop using the incentive spirometer when instructed by your caregiver.  RISKS AND COMPLICATIONS  Take your time so you do not get dizzy or light-headed.  If you are in pain, you may need to take or ask for pain medication before doing incentive spirometry. It is harder to take a deep breath if you are having pain. AFTER USE  Rest and breathe slowly and easily.  It can be helpful to keep track of a log of your progress. Your caregiver can provide you with a simple table to help with this. If you are using the spirometer at home, follow these instructions: [place] IF:   You are having difficultly using the spirometer.  You have trouble using the spirometer as often as instructed.  Your pain medication is not giving enough relief while using the spirometer.  You develop fever of 100.5 F (38.1 C) or higher. SEEK IMMEDIATE MEDICAL CARE IF:   You cough up bloody  sputum that had not been present before.  You develop fever of 102 F (38.9 C) or greater.  You develop worsening pain at or near the incision site. MAKE SURE YOU:   Understand these instructions.  Will watch your condition.  Will get help right away if you are not doing well or get worse. Document Released: 09/17/2006 Document Revised: 07/30/2011 Document Reviewed: 11/18/2006 Exit[employer] Patient Information 2014 [employer], [place].   ________________________________________________________________________  WHAT IS A BLOOD TRANSFUSION? Blood Transfusion Information  A transfusion is the replacement of blood or some of its parts. Blood is made up of multiple cells which provide different functions.  Red blood cells carry oxygen and are used for blood loss replacement.  White blood cells fight against infection.  Platelets control bleeding.  Plasma helps clot blood.  Other blood products are available for  specialized needs, such as hemophilia or other clotting disorders. BEFORE THE TRANSFUSION  Who gives blood for transfusions?   Healthy volunteers who are fully evaluated to make sure their blood is safe. This is blood bank blood. Transfusion therapy is the safest it has ever been in the practice of medicine. Before blood is taken from a donor, a complete history is taken to make sure that person has no history of diseases nor engages in risky social behavior (examples are intravenous drug use or sexual activity with multiple partners). The donor's travel history is screened to minimize risk of transmitting infections, such as malaria. The donated blood is tested for signs of infectious diseases, such as HIV and hepatitis. The blood is then tested to be sure it is compatible with you in order to minimize the chance of a transfusion reaction. If you or a relative donates blood, this is often done in anticipation of surgery and is not appropriate for emergency situations. It takes many days to process the donated blood. RISKS AND COMPLICATIONS Although transfusion therapy is very safe and saves many lives, the main dangers of transfusion include:   Getting an infectious disease.  Developing a transfusion reaction. This is an allergic reaction to something in the blood you were given. Every precaution is taken to prevent this. The decision to have a blood transfusion has been considered carefully by your caregiver before blood is given. Blood is not given unless the benefits outweigh the risks. AFTER THE TRANSFUSION  Right after receiving a blood transfusion, you will usually feel much better and more energetic. This is especially true if your red blood cells have gotten low (anemic). The transfusion raises the level of the red blood cells which carry oxygen, and this usually causes an energy increase.  The nurse administering the transfusion will monitor you carefully for complications. HOME CARE  INSTRUCTIONS  No special instructions are needed after a transfusion. You may find your energy is better. Speak with your caregiver about any limitations on activity for underlying diseases you may have. SEEK MEDICAL CARE IF:   Your condition is not improving after your transfusion.  You develop redness or irritation at the intravenous (IV) site. SEEK IMMEDIATE MEDICAL CARE IF:  Any of the following symptoms occur over the next 12 hours:  Shaking chills.  You have a temperature by mouth above 102 F (38.9 C), not controlled by medicine.  Chest, back, or muscle pain.  People around you feel you are not acting correctly or are confused.  Shortness of breath or difficulty breathing.  Dizziness and fainting.  You get a rash or develop hives.  You have a  decrease in urine output.  Your urine turns a dark color or changes to pink, red, or brown. Any of the following symptoms occur over the next 10 days:  You have a temperature by mouth above 102 F (38.9 C), not controlled by medicine.  Shortness of breath.  Weakness after normal activity.  The white part of the eye turns yellow (jaundice).  You have a decrease in the amount of urine or are urinating less often.  Your urine turns a dark color or changes to pink, red, or brown. Document Released: 05/04/2000 Document Revised: 07/30/2011 Document Reviewed: 12/22/2007 [facility] Patient Information 2014 [place], [place].  _______________________________________________________________________

## 2017-12-30 ENCOUNTER — Encounter (HOSPITAL_COMMUNITY): Payer: Self-pay

## 2017-12-30 ENCOUNTER — Other Ambulatory Visit: Payer: Self-pay

## 2017-12-30 ENCOUNTER — Encounter (HOSPITAL_COMMUNITY)
Admission: RE | Admit: 2017-12-30 | Discharge: 2017-12-30 | Disposition: A | Discharge status: Home / Self Care | Payer: 59 | Source: Ambulatory Visit | Attending: Orthopedic Surgery | Admitting: Orthopedic Surgery

## 2017-12-30 DIAGNOSIS — M1711 Unilateral primary osteoarthritis, right knee: Secondary | ICD-10-CM | POA: Insufficient documentation

## 2017-12-30 DIAGNOSIS — Z01812 Encounter for preprocedural laboratory examination: Secondary | ICD-10-CM | POA: Insufficient documentation

## 2017-12-30 LAB — COMPREHENSIVE METABOLIC PANEL
ALBUMIN: 4.1 g/dL (ref 3.5–5.0)
ALK PHOS: 40 U/L (ref 38–126)
ALT: 29 U/L (ref 0–44)
AST: 27 U/L (ref 15–41)
Anion gap: 7 (ref 5–15)
BILIRUBIN TOTAL: 0.5 mg/dL (ref 0.3–1.2)
BUN: 15 mg/dL (ref 6–20)
CALCIUM: 9.7 mg/dL (ref 8.9–10.3)
CO2: 28 mmol/L (ref 22–32)
Chloride: 107 mmol/L (ref 98–111)
Creatinine, Ser: 0.51 mg/dL (ref 0.44–1.00)
GFR calc Af Amer: >60 mL/min (ref >60)
GFR calc non Af Amer: >60 mL/min (ref >60)
GLUCOSE: 74 mg/dL (ref 70–99)
Potassium: 5.1 mmol/L (ref 3.5–5.1)
SODIUM: 142 mmol/L (ref 135–145)
TOTAL PROTEIN: 6.9 g/dL (ref 6.5–8.1)

## 2017-12-30 LAB — CBC
HEMATOCRIT: 36.6 % (ref 36.0–46.0)
HEMOGLOBIN: 12 g/dL (ref 12.0–15.0)
MCH: 28 pg (ref 26.0–34.0)
MCHC: 32.8 g/dL (ref 30.0–36.0)
MCV: 85.5 fL (ref 78.0–100.0)
Platelets: 245 10*3/uL (ref 150–400)
RBC: 4.28 MIL/uL (ref 3.87–5.11)
RDW: 15.2 % (ref 11.5–15.5)
WBC: 6.5 10*3/uL (ref 4.0–10.5)

## 2017-12-30 LAB — GLUCOSE, CAPILLARY: Glucose-Capillary: 78 mg/dL (ref 70–99)

## 2017-12-30 LAB — APTT: APTT: 27 s (ref 24–36)

## 2017-12-30 LAB — SURGICAL PCR SCREEN
MRSA, PCR: NEGATIVE
STAPHYLOCOCCUS AUREUS: NEGATIVE

## 2017-12-30 LAB — PROTIME-INR
INR: 1.03
Prothrombin Time: 13.4 seconds (ref 11.4–15.2)

## 2017-12-31 LAB — HEMOGLOBIN A1C
Hgb A1c MFr Bld: 6 % — ABNORMAL HIGH (ref 4.8–5.6)
MEAN PLASMA GLUCOSE: 126 mg/dL

## 2018-01-05 MED ORDER — BUPIVACAINE LIPOSOME 1.3 % IJ SUSP
20.0000 mL | INTRAMUSCULAR | Status: DC
  Filled 2018-01-05: qty 20

## 2018-01-06 ENCOUNTER — Inpatient Hospital Stay (HOSPITAL_COMMUNITY): Payer: 59 | Admitting: Anesthesiology

## 2018-01-06 ENCOUNTER — Encounter (HOSPITAL_COMMUNITY): Payer: Self-pay

## 2018-01-06 ENCOUNTER — Other Ambulatory Visit: Payer: Self-pay

## 2018-01-06 ENCOUNTER — Inpatient Hospital Stay (HOSPITAL_COMMUNITY)
Admission: RE | Admit: 2018-01-06 | Discharge: 2018-01-08 | DRG: 470 | Disposition: A | Discharge status: Home / Self Care | Payer: 59 | Source: Ambulatory Visit | Attending: Orthopedic Surgery | Admitting: Orthopedic Surgery

## 2018-01-06 ENCOUNTER — Encounter (HOSPITAL_COMMUNITY)
Admission: RE | Disposition: A | Discharge status: Home / Self Care | Payer: Self-pay | Source: Ambulatory Visit | Attending: Orthopedic Surgery

## 2018-01-06 DIAGNOSIS — Z853 Personal history of malignant neoplasm of breast: Secondary | ICD-10-CM

## 2018-01-06 DIAGNOSIS — Z888 Allergy status to other drugs, medicaments and biological substances status: Secondary | ICD-10-CM | POA: Diagnosis not present

## 2018-01-06 DIAGNOSIS — E785 Hyperlipidemia, unspecified: Secondary | ICD-10-CM | POA: Diagnosis present

## 2018-01-06 DIAGNOSIS — Z9884 Bariatric surgery status: Secondary | ICD-10-CM

## 2018-01-06 DIAGNOSIS — M179 Osteoarthritis of knee, unspecified: Secondary | ICD-10-CM | POA: Diagnosis present

## 2018-01-06 DIAGNOSIS — Z96652 Presence of left artificial knee joint: Secondary | ICD-10-CM | POA: Diagnosis present

## 2018-01-06 DIAGNOSIS — Z9989 Dependence on other enabling machines and devices: Secondary | ICD-10-CM | POA: Diagnosis not present

## 2018-01-06 DIAGNOSIS — Z87891 Personal history of nicotine dependence: Secondary | ICD-10-CM

## 2018-01-06 DIAGNOSIS — Z7984 Long term (current) use of oral hypoglycemic drugs: Secondary | ICD-10-CM

## 2018-01-06 DIAGNOSIS — Z880 Allergy status to penicillin: Secondary | ICD-10-CM | POA: Diagnosis not present

## 2018-01-06 DIAGNOSIS — M1711 Unilateral primary osteoarthritis, right knee: Secondary | ICD-10-CM | POA: Diagnosis not present

## 2018-01-06 DIAGNOSIS — I10 Essential (primary) hypertension: Secondary | ICD-10-CM | POA: Diagnosis present

## 2018-01-06 DIAGNOSIS — Z79899 Other long term (current) drug therapy: Secondary | ICD-10-CM

## 2018-01-06 DIAGNOSIS — F329 Major depressive disorder, single episode, unspecified: Secondary | ICD-10-CM | POA: Diagnosis present

## 2018-01-06 DIAGNOSIS — G4730 Sleep apnea, unspecified: Secondary | ICD-10-CM | POA: Diagnosis present

## 2018-01-06 DIAGNOSIS — E669 Obesity, unspecified: Secondary | ICD-10-CM | POA: Diagnosis present

## 2018-01-06 DIAGNOSIS — M810 Age-related osteoporosis without current pathological fracture: Secondary | ICD-10-CM | POA: Diagnosis present

## 2018-01-06 DIAGNOSIS — F419 Anxiety disorder, unspecified: Secondary | ICD-10-CM | POA: Diagnosis present

## 2018-01-06 DIAGNOSIS — Z6830 Body mass index (BMI) 30.0-30.9, adult: Secondary | ICD-10-CM | POA: Diagnosis not present

## 2018-01-06 DIAGNOSIS — M1710 Unilateral primary osteoarthritis, unspecified knee: Secondary | ICD-10-CM | POA: Diagnosis present

## 2018-01-06 DIAGNOSIS — E119 Type 2 diabetes mellitus without complications: Secondary | ICD-10-CM | POA: Diagnosis not present

## 2018-01-06 DIAGNOSIS — G8918 Other acute postprocedural pain: Secondary | ICD-10-CM | POA: Diagnosis not present

## 2018-01-06 DIAGNOSIS — K219 Gastro-esophageal reflux disease without esophagitis: Secondary | ICD-10-CM | POA: Diagnosis present

## 2018-01-06 HISTORY — PX: TOTAL KNEE ARTHROPLASTY: SHX125

## 2018-01-06 LAB — GLUCOSE, CAPILLARY
GLUCOSE-CAPILLARY: 123 mg/dL — AB (ref 70–99)
GLUCOSE-CAPILLARY: 258 mg/dL — AB (ref 70–99)
Glucose-Capillary: 109 mg/dL — ABNORMAL HIGH (ref 70–99)
Glucose-Capillary: 79 mg/dL (ref 70–99)

## 2018-01-06 LAB — TYPE AND SCREEN
ABO/RH(D): O POS
ANTIBODY SCREEN: NEGATIVE

## 2018-01-06 SURGERY — ARTHROPLASTY, KNEE, TOTAL
Anesthesia: Spinal | Site: Knee | Laterality: Right

## 2018-01-06 MED ORDER — ONDANSETRON HCL 4 MG/2ML IJ SOLN
INTRAMUSCULAR | Status: AC
  Filled 2018-01-06: qty 2

## 2018-01-06 MED ORDER — METOCLOPRAMIDE HCL 5 MG/ML IJ SOLN: 5.0000 mg | Freq: Three times a day (TID) | INTRAMUSCULAR | Status: DC | PRN

## 2018-01-06 MED ORDER — ZOLPIDEM TARTRATE 5 MG PO TABS
5.0000 mg | ORAL_TABLET | Freq: Every evening | ORAL | Status: DC | PRN
  Administered 2018-01-06 – 2018-01-07 (×2): 5 mg via ORAL
  Filled 2018-01-06 (×2): qty 1

## 2018-01-06 MED ORDER — FLEET ENEMA 7-19 GM/118ML RE ENEM: 1.0000 | ENEMA | Freq: Once | RECTAL | Status: DC | PRN

## 2018-01-06 MED ORDER — ONDANSETRON HCL 4 MG PO TABS: 4.0000 mg | ORAL_TABLET | Freq: Four times a day (QID) | ORAL | Status: DC | PRN

## 2018-01-06 MED ORDER — TRANEXAMIC ACID 1000 MG/10ML IV SOLN
1000.0000 mg | INTRAVENOUS | Status: AC
  Administered 2018-01-06: 1000 mg via INTRAVENOUS
  Filled 2018-01-06: qty 10

## 2018-01-06 MED ORDER — FENTANYL CITRATE (PF) 100 MCG/2ML IJ SOLN
50.0000 ug | INTRAMUSCULAR | Status: DC | PRN
  Administered 2018-01-06: 50 ug via INTRAVENOUS
  Filled 2018-01-06: qty 2

## 2018-01-06 MED ORDER — METOCLOPRAMIDE HCL 5 MG PO TABS: 5.0000 mg | ORAL_TABLET | Freq: Three times a day (TID) | ORAL | Status: DC | PRN

## 2018-01-06 MED ORDER — POLYETHYLENE GLYCOL 3350 17 G PO PACK
17.0000 g | PACK | Freq: Every day | ORAL | Status: DC | PRN
  Administered 2018-01-08: 17 g via ORAL
  Filled 2018-01-06: qty 1

## 2018-01-06 MED ORDER — MIDAZOLAM HCL 2 MG/2ML IJ SOLN
1.0000 mg | INTRAMUSCULAR | Status: DC | PRN
  Administered 2018-01-06: 2 mg via INTRAVENOUS
  Filled 2018-01-06: qty 2

## 2018-01-06 MED ORDER — TRAMADOL HCL 50 MG PO TABS: 50.0000 mg | ORAL_TABLET | Freq: Four times a day (QID) | ORAL | Status: DC | PRN

## 2018-01-06 MED ORDER — GABAPENTIN 300 MG PO CAPS
300.0000 mg | ORAL_CAPSULE | Freq: Once | ORAL | Status: AC
  Administered 2018-01-06: 300 mg via ORAL
  Filled 2018-01-06: qty 1

## 2018-01-06 MED ORDER — METOCLOPRAMIDE HCL 5 MG/ML IJ SOLN: 10.0000 mg | Freq: Once | INTRAMUSCULAR | Status: DC | PRN

## 2018-01-06 MED ORDER — CYCLOSPORINE 0.05 % OP EMUL
1.0000 [drp] | Freq: Two times a day (BID) | OPHTHALMIC | Status: DC
  Administered 2018-01-06 – 2018-01-08 (×4): 1 [drp] via OPHTHALMIC
  Filled 2018-01-06 (×5): qty 1

## 2018-01-06 MED ORDER — SODIUM CHLORIDE 0.9 % IR SOLN
Status: DC | PRN
  Administered 2018-01-06: 1000 mL

## 2018-01-06 MED ORDER — HYDROCODONE-ACETAMINOPHEN 7.5-325 MG PO TABS: 1.0000 | ORAL_TABLET | Freq: Once | ORAL | Status: DC | PRN

## 2018-01-06 MED ORDER — ACETAMINOPHEN 500 MG PO TABS
1000.0000 mg | ORAL_TABLET | Freq: Four times a day (QID) | ORAL | Status: AC
  Administered 2018-01-06 – 2018-01-07 (×4): 1000 mg via ORAL
  Filled 2018-01-06 (×4): qty 2

## 2018-01-06 MED ORDER — BUPIVACAINE IN DEXTROSE 0.75-8.25 % IT SOLN
INTRATHECAL | Status: DC | PRN
  Administered 2018-01-06: 1.6 mL via INTRATHECAL

## 2018-01-06 MED ORDER — INSULIN ASPART 100 UNIT/ML ~~LOC~~ SOLN
0.0000 [IU] | Freq: Three times a day (TID) | SUBCUTANEOUS | Status: DC
  Administered 2018-01-07: 5 [IU] via SUBCUTANEOUS
  Administered 2018-01-07: 2 [IU] via SUBCUTANEOUS
  Administered 2018-01-07: 3 [IU] via SUBCUTANEOUS

## 2018-01-06 MED ORDER — SODIUM CHLORIDE 0.9 % IV SOLN
INTRAVENOUS | Status: DC | PRN
  Administered 2018-01-06: 35 ug/min via INTRAVENOUS

## 2018-01-06 MED ORDER — DEXAMETHASONE SODIUM PHOSPHATE 10 MG/ML IJ SOLN
10.0000 mg | Freq: Once | INTRAMUSCULAR | Status: AC
  Administered 2018-01-07: 10 mg via INTRAVENOUS
  Filled 2018-01-06: qty 1

## 2018-01-06 MED ORDER — MENTHOL 3 MG MT LOZG: 1.0000 | LOZENGE | OROMUCOSAL | Status: DC | PRN

## 2018-01-06 MED ORDER — GABAPENTIN 300 MG PO CAPS
300.0000 mg | ORAL_CAPSULE | Freq: Three times a day (TID) | ORAL | Status: DC
  Administered 2018-01-06 – 2018-01-08 (×6): 300 mg via ORAL
  Filled 2018-01-06 (×6): qty 1

## 2018-01-06 MED ORDER — STERILE WATER FOR IRRIGATION IR SOLN
Status: DC | PRN
  Administered 2018-01-06: 2000 mL

## 2018-01-06 MED ORDER — PROPOFOL 10 MG/ML IV BOLUS
INTRAVENOUS | Status: DC | PRN
  Administered 2018-01-06: 20 mg via INTRAVENOUS

## 2018-01-06 MED ORDER — MEPERIDINE HCL 50 MG/ML IJ SOLN: 6.2500 mg | INTRAMUSCULAR | Status: DC | PRN

## 2018-01-06 MED ORDER — PROPOFOL 500 MG/50ML IV EMUL
INTRAVENOUS | Status: DC | PRN
  Administered 2018-01-06: 100 ug/kg/min via INTRAVENOUS

## 2018-01-06 MED ORDER — SODIUM CHLORIDE 0.9 % IJ SOLN
INTRAMUSCULAR | Status: AC
  Filled 2018-01-06: qty 10

## 2018-01-06 MED ORDER — BUPIVACAINE LIPOSOME 1.3 % IJ SUSP
INTRAMUSCULAR | Status: DC | PRN
  Administered 2018-01-06: 20 mL

## 2018-01-06 MED ORDER — ONDANSETRON HCL 4 MG/2ML IJ SOLN
4.0000 mg | Freq: Four times a day (QID) | INTRAMUSCULAR | Status: DC | PRN
  Filled 2018-01-06: qty 2

## 2018-01-06 MED ORDER — VANCOMYCIN HCL IN DEXTROSE 1-5 GM/200ML-% IV SOLN
1000.0000 mg | Freq: Two times a day (BID) | INTRAVENOUS | Status: AC
  Administered 2018-01-06: 1000 mg via INTRAVENOUS
  Filled 2018-01-06: qty 200

## 2018-01-06 MED ORDER — FENTANYL CITRATE (PF) 100 MCG/2ML IJ SOLN
INTRAMUSCULAR | Status: DC | PRN
  Administered 2018-01-06: 50 ug via INTRAVENOUS

## 2018-01-06 MED ORDER — DEXAMETHASONE SODIUM PHOSPHATE 10 MG/ML IJ SOLN
INTRAMUSCULAR | Status: AC
  Filled 2018-01-06: qty 1

## 2018-01-06 MED ORDER — LACTATED RINGERS IV SOLN
INTRAVENOUS | Status: DC
  Administered 2018-01-06: 12:00:00 via INTRAVENOUS
  Administered 2018-01-06: 1000 mL via INTRAVENOUS

## 2018-01-06 MED ORDER — CHLORHEXIDINE GLUCONATE 4 % EX LIQD: 60.0000 mL | Freq: Once | CUTANEOUS | Status: DC

## 2018-01-06 MED ORDER — LORATADINE 10 MG PO TABS
10.0000 mg | ORAL_TABLET | Freq: Every day | ORAL | Status: DC
  Administered 2018-01-07 – 2018-01-08 (×2): 10 mg via ORAL
  Filled 2018-01-06 (×3): qty 1

## 2018-01-06 MED ORDER — FENTANYL CITRATE (PF) 100 MCG/2ML IJ SOLN: 25.0000 ug | INTRAMUSCULAR | Status: DC | PRN

## 2018-01-06 MED ORDER — BISACODYL 10 MG RE SUPP: 10.0000 mg | Freq: Every day | RECTAL | Status: DC | PRN

## 2018-01-06 MED ORDER — METHOCARBAMOL 500 MG PO TABS
500.0000 mg | ORAL_TABLET | Freq: Four times a day (QID) | ORAL | Status: DC | PRN
  Administered 2018-01-06 – 2018-01-08 (×5): 500 mg via ORAL
  Filled 2018-01-06 (×5): qty 1

## 2018-01-06 MED ORDER — SODIUM CHLORIDE 0.9 % IJ SOLN
INTRAMUSCULAR | Status: AC
  Filled 2018-01-06: qty 50

## 2018-01-06 MED ORDER — PROPOFOL 10 MG/ML IV BOLUS
INTRAVENOUS | Status: AC
  Filled 2018-01-06: qty 60

## 2018-01-06 MED ORDER — PAROXETINE HCL 20 MG PO TABS
40.0000 mg | ORAL_TABLET | Freq: Every day | ORAL | Status: DC
  Administered 2018-01-06 – 2018-01-07 (×2): 40 mg via ORAL
  Filled 2018-01-06 (×2): qty 2

## 2018-01-06 MED ORDER — OXYCODONE HCL 5 MG PO TABS
10.0000 mg | ORAL_TABLET | ORAL | Status: DC | PRN
  Administered 2018-01-06: 10 mg via ORAL
  Administered 2018-01-07: 15 mg via ORAL
  Administered 2018-01-07: 10 mg via ORAL
  Administered 2018-01-08 (×3): 15 mg via ORAL
  Filled 2018-01-06 (×4): qty 3

## 2018-01-06 MED ORDER — SODIUM CHLORIDE 0.9 % IV SOLN
INTRAVENOUS | Status: DC
  Administered 2018-01-06 – 2018-01-07 (×2): via INTRAVENOUS

## 2018-01-06 MED ORDER — DEXAMETHASONE SODIUM PHOSPHATE 10 MG/ML IJ SOLN
8.0000 mg | Freq: Once | INTRAMUSCULAR | Status: AC
  Administered 2018-01-06: 10 mg via INTRAVENOUS

## 2018-01-06 MED ORDER — ASPIRIN EC 325 MG PO TBEC
325.0000 mg | DELAYED_RELEASE_TABLET | Freq: Two times a day (BID) | ORAL | Status: DC
  Administered 2018-01-07 – 2018-01-08 (×3): 325 mg via ORAL
  Filled 2018-01-06 (×3): qty 1

## 2018-01-06 MED ORDER — DOCUSATE SODIUM 100 MG PO CAPS
100.0000 mg | ORAL_CAPSULE | Freq: Two times a day (BID) | ORAL | Status: DC
  Administered 2018-01-06 – 2018-01-08 (×4): 100 mg via ORAL
  Filled 2018-01-06 (×4): qty 1

## 2018-01-06 MED ORDER — METHOCARBAMOL 500 MG IVPB - SIMPLE MED
500.0000 mg | Freq: Four times a day (QID) | INTRAVENOUS | Status: DC | PRN
  Filled 2018-01-06: qty 50

## 2018-01-06 MED ORDER — PHENOL 1.4 % MT LIQD
1.0000 | OROMUCOSAL | Status: DC | PRN
  Filled 2018-01-06: qty 177

## 2018-01-06 MED ORDER — MIDAZOLAM HCL 2 MG/2ML IJ SOLN
INTRAMUSCULAR | Status: AC
  Filled 2018-01-06: qty 2

## 2018-01-06 MED ORDER — HYDROMORPHONE HCL 1 MG/ML IJ SOLN
0.5000 mg | INTRAMUSCULAR | Status: DC | PRN
  Administered 2018-01-06: 1 mg via INTRAVENOUS
  Administered 2018-01-07 – 2018-01-08 (×4): 0.5 mg via INTRAVENOUS
  Filled 2018-01-06 (×4): qty 1

## 2018-01-06 MED ORDER — OXYCODONE HCL 5 MG PO TABS
5.0000 mg | ORAL_TABLET | ORAL | Status: DC | PRN
  Administered 2018-01-06 – 2018-01-07 (×4): 10 mg via ORAL
  Administered 2018-01-07: 5 mg via ORAL
  Filled 2018-01-06: qty 1
  Filled 2018-01-06 (×6): qty 2

## 2018-01-06 MED ORDER — ONDANSETRON HCL 4 MG/2ML IJ SOLN
INTRAMUSCULAR | Status: DC | PRN
  Administered 2018-01-06: 4 mg via INTRAVENOUS

## 2018-01-06 MED ORDER — PANTOPRAZOLE SODIUM 40 MG PO TBEC: 40.0000 mg | DELAYED_RELEASE_TABLET | Freq: Every day | ORAL | Status: DC | PRN

## 2018-01-06 MED ORDER — ACETAMINOPHEN 10 MG/ML IV SOLN
1000.0000 mg | Freq: Four times a day (QID) | INTRAVENOUS | Status: DC
  Administered 2018-01-06: 1000 mg via INTRAVENOUS
  Filled 2018-01-06: qty 100

## 2018-01-06 MED ORDER — SIMVASTATIN 20 MG PO TABS
20.0000 mg | ORAL_TABLET | Freq: Every day | ORAL | Status: DC
  Administered 2018-01-06 – 2018-01-07 (×2): 20 mg via ORAL
  Filled 2018-01-06 (×2): qty 1

## 2018-01-06 MED ORDER — TRAMADOL HCL 50 MG PO TABS
50.0000 mg | ORAL_TABLET | Freq: Four times a day (QID) | ORAL | Status: DC | PRN
  Administered 2018-01-06 – 2018-01-08 (×4): 100 mg via ORAL
  Filled 2018-01-06 (×5): qty 2

## 2018-01-06 MED ORDER — OMEPRAZOLE MAGNESIUM 20 MG PO TBEC: 20.0000 mg | DELAYED_RELEASE_TABLET | Freq: Every day | ORAL | Status: DC | PRN

## 2018-01-06 MED ORDER — SODIUM CHLORIDE 0.9 % IJ SOLN
INTRAMUSCULAR | Status: DC | PRN
  Administered 2018-01-06: 60 mL

## 2018-01-06 MED ORDER — FENTANYL CITRATE (PF) 100 MCG/2ML IJ SOLN
INTRAMUSCULAR | Status: AC
  Filled 2018-01-06: qty 2

## 2018-01-06 MED ORDER — ROPIVACAINE HCL 7.5 MG/ML IJ SOLN
INTRAMUSCULAR | Status: DC | PRN
  Administered 2018-01-06: 20 mL via PERINEURAL

## 2018-01-06 MED ORDER — VANCOMYCIN HCL IN DEXTROSE 1-5 GM/200ML-% IV SOLN
1000.0000 mg | INTRAVENOUS | Status: AC
  Administered 2018-01-06: 1000 mg via INTRAVENOUS
  Filled 2018-01-06: qty 200

## 2018-01-06 MED ORDER — DIPHENHYDRAMINE HCL 12.5 MG/5ML PO ELIX
12.5000 mg | ORAL_SOLUTION | ORAL | Status: DC | PRN
  Administered 2018-01-08: 25 mg via ORAL
  Filled 2018-01-06: qty 10

## 2018-01-06 SURGICAL SUPPLY — 57 items
ATTUNE MED DOME PAT 32 KNEE (Knees) ×2 IMPLANT
ATTUNE MED DOME PAT 32MM KNEE (Knees) ×1 IMPLANT
ATTUNE PS FEM RT SZ 3 CEM KNEE (Femur) ×3 IMPLANT
ATTUNE PSRP INSR SZ3 10 KNEE (Insert) ×2 IMPLANT
ATTUNE PSRP INSR SZ3 10MMKNEE (Insert) ×1 IMPLANT
BAG ZIPLOCK 12X15 (MISCELLANEOUS) ×3 IMPLANT
BANDAGE ACE 6X5 VEL STRL LF (GAUZE/BANDAGES/DRESSINGS) ×3 IMPLANT
BASE TIBIAL ROT PLAT SZ 3 KNEE (Knees) ×1 IMPLANT
BLADE SAG 18X100X1.27 (BLADE) ×3 IMPLANT
BLADE SAW SGTL 11.0X1.19X90.0M (BLADE) ×3 IMPLANT
BOWL SMART MIX CTS (DISPOSABLE) ×3 IMPLANT
CEMENT HV SMART SET (Cement) ×6 IMPLANT
CLOSURE WOUND 1/2 X4 (GAUZE/BANDAGES/DRESSINGS) ×1
COVER SURGICAL LIGHT HANDLE (MISCELLANEOUS) ×3 IMPLANT
CUFF TOURN SGL QUICK 34 (TOURNIQUET CUFF) ×2
CUFF TRNQT CYL 34X4X40X1 (TOURNIQUET CUFF) ×1 IMPLANT
DECANTER SPIKE VIAL GLASS SM (MISCELLANEOUS) ×3 IMPLANT
DRAPE U-SHAPE 47X51 STRL (DRAPES) ×3 IMPLANT
DRSG ADAPTIC 3X8 NADH LF (GAUZE/BANDAGES/DRESSINGS) ×3 IMPLANT
DRSG PAD ABDOMINAL 8X10 ST (GAUZE/BANDAGES/DRESSINGS) ×3 IMPLANT
DURAPREP 26ML APPLICATOR (WOUND CARE) ×3 IMPLANT
ELECT REM PT RETURN 15FT ADLT (MISCELLANEOUS) ×3 IMPLANT
EVACUATOR 1/8 PVC DRAIN (DRAIN) ×3 IMPLANT
GAUZE SPONGE 4X4 12PLY STRL (GAUZE/BANDAGES/DRESSINGS) ×3 IMPLANT
GLOVE BIO SURGEON STRL SZ7 (GLOVE) ×3 IMPLANT
GLOVE BIO SURGEON STRL SZ8 (GLOVE) ×6 IMPLANT
GLOVE BIOGEL PI IND STRL 6.5 (GLOVE) ×5 IMPLANT
GLOVE BIOGEL PI IND STRL 7.0 (GLOVE) IMPLANT
GLOVE BIOGEL PI IND STRL 8 (GLOVE) ×1 IMPLANT
GLOVE BIOGEL PI INDICATOR 6.5 (GLOVE) ×10
GLOVE BIOGEL PI INDICATOR 7.0 (GLOVE)
GLOVE BIOGEL PI INDICATOR 8 (GLOVE) ×2
GLOVE SURG SS PI 6.5 STRL IVOR (GLOVE) ×3 IMPLANT
GOWN STRL REUS W/TWL LRG LVL3 (GOWN DISPOSABLE) ×12 IMPLANT
HANDPIECE INTERPULSE COAX TIP (DISPOSABLE) ×2
HOLDER FOLEY CATH W/STRAP (MISCELLANEOUS) IMPLANT
IMMOBILIZER KNEE 20 (SOFTGOODS) ×3
IMMOBILIZER KNEE 20 THIGH 36 (SOFTGOODS) ×1 IMPLANT
MANIFOLD NEPTUNE II (INSTRUMENTS) ×3 IMPLANT
NS IRRIG 1000ML POUR BTL (IV SOLUTION) ×3 IMPLANT
PACK TOTAL KNEE CUSTOM (KITS) ×3 IMPLANT
PADDING CAST COTTON 6X4 STRL (CAST SUPPLIES) ×3 IMPLANT
PIN STEINMAN FIXATION KNEE (PIN) ×3 IMPLANT
POSITIONER SURGICAL ARM (MISCELLANEOUS) ×3 IMPLANT
SET HNDPC FAN SPRY TIP SCT (DISPOSABLE) ×1 IMPLANT
STRIP CLOSURE SKIN 1/2X4 (GAUZE/BANDAGES/DRESSINGS) ×2 IMPLANT
SUT ETHIBOND NAB CT1 #1 30IN (SUTURE) ×6 IMPLANT
SUT MNCRL AB 4-0 PS2 18 (SUTURE) ×3 IMPLANT
SUT STRATAFIX 0 PDS 27 VIOLET (SUTURE) ×3
SUT VIC AB 2-0 CT1 27 (SUTURE) ×6
SUT VIC AB 2-0 CT1 TAPERPNT 27 (SUTURE) ×3 IMPLANT
SUTURE STRATFX 0 PDS 27 VIOLET (SUTURE) ×1 IMPLANT
TIBIAL BASE ROT PLAT SZ 3 KNEE (Knees) ×3 IMPLANT
TRAY FOLEY MTR SLVR 16FR STAT (SET/KITS/TRAYS/PACK) ×3 IMPLANT
WATER STERILE IRR 1000ML POUR (IV SOLUTION) ×6 IMPLANT
WRAP KNEE MAXI GEL POST OP (GAUZE/BANDAGES/DRESSINGS) ×3 IMPLANT
YANKAUER SUCT BULB TIP 10FT TU (MISCELLANEOUS) ×3 IMPLANT

## 2018-01-06 NOTE — Op Note (Signed)
OPERATIVE REPORT-TOTAL KNEE ARTHROPLASTY   Pre-operative diagnosis- Osteoarthritis  Right knee(s)  Post-operative diagnosis- Osteoarthritis Right knee(s)  Procedure-  Right  Total Knee Arthroplasty  Surgeon- Dione Plover. Joya Willmott, MD  Assistant- Ardeen Jourdain, PA-C   Anesthesia-  Adductor canal block and spinal  EBL-50 mL   Drains Hemovac  Tourniquet time-  Total Tourniquet Time Documented: Thigh (Right) - 44 minutes Total: Thigh (Right) - 44 minutes     Complications- None  Condition-PACU - hemodynamically stable.   Brief Clinical Note  Julie Fischer is a 57 y.o. year old female with end stage OA of her right knee with progressively worsening pain and dysfunction. She has constant pain, with activity and at rest and significant functional deficits with difficulties even with ADLs. She has had extensive non-op management including analgesics, injections of cortisone and viscosupplements, and home exercise program, but remains in significant pain with significant dysfunction.Radiographs show bone on bone arthritis medial and patellofemoral. She presents now for right Total Knee Arthroplasty.    Procedure in detail---   The patient is brought into the operating room and positioned supine on the operating table. After successful administration of  Adductor canal block and spinal,   a tourniquet is placed high on the  Right thigh(s) and the lower extremity is prepped and draped in the usual sterile fashion. Time out is performed by the operating team and then the  Right lower extremity is wrapped in Esmarch, knee flexed and the tourniquet inflated to 300 mmHg.       A midline incision is made with a ten blade through the subcutaneous tissue to the level of the extensor mechanism. A fresh blade is used to make a medial parapatellar arthrotomy. Soft tissue over the proximal medial tibia is subperiosteally elevated to the joint line with a knife and into the semimembranosus bursa with  a Cobb elevator. Soft tissue over the proximal lateral tibia is elevated with attention being paid to avoiding the patellar tendon on the tibial tubercle. The patella is everted, knee flexed 90 degrees and the ACL and PCL are removed. Findings are bone on bone medial and patellofemoral with large global osteophytes.        The drill is used to create a starting hole in the distal femur and the canal is thoroughly irrigated with sterile saline to remove the fatty contents. The 5 degree Right  valgus alignment guide is placed into the femoral canal and the distal femoral cutting block is pinned to remove 9 mm off the distal femur. Resection is made with an oscillating saw.      The tibia is subluxed forward and the menisci are removed. The extramedullary alignment guide is placed referencing proximally at the medial aspect of the tibial tubercle and distally along the second metatarsal axis and tibial crest. The block is pinned to remove 55mm off the more deficient medial  side. Resection is made with an oscillating saw. Size 3is the most appropriate size for the tibia and the proximal tibia is prepared with the modular drill and keel punch for that size.      The femoral sizing guide is placed and size 3 is most appropriate. Rotation is marked off the epicondylar axis and confirmed by creating a rectangular flexion gap at 90 degrees. The size 3 cutting block is pinned in this rotation and the anterior, posterior and chamfer cuts are made with the oscillating saw. The intercondylar block is then placed and that cut is made.  Trial size 3 tibial component, trial size 3 posterior stabilized femur and a 10  mm posterior stabilized rotating platform insert trial is placed. Full extension is achieved with excellent varus/valgus and anterior/posterior balance throughout full range of motion. The patella is everted and thickness measured to be 22  mm. Free hand resection is taken to 12 mm, a 32 template is placed, lug  holes are drilled, trial patella is placed, and it tracks normally. Osteophytes are removed off the posterior femur with the trial in place. All trials are removed and the cut bone surfaces prepared with pulsatile lavage. Cement is mixed and once ready for implantation, the size 3 tibial implant, size  3 posterior stabilized femoral component, and the size 32 patella are cemented in place and the patella is held with the clamp. The trial insert is placed and the knee held in full extension. The Exparel (20 ml mixed with 60 ml saline) is injected into the extensor mechanism, posterior capsule, medial and lateral gutters and subcutaneous tissues.  All extruded cement is removed and once the cement is hard the permanent 10 mm posterior stabilized rotating platform insert is placed into the tibial tray.      The wound is copiously irrigated with saline solution and the extensor mechanism closed over a hemovac drain with #1 V-loc suture. The tourniquet is released for a total tourniquet time of 44  minutes. Flexion against gravity is 140 degrees and the patella tracks normally. Subcutaneous tissue is closed with 2.0 vicryl and subcuticular with running 4.0 Monocryl. The incision is cleaned and dried and steri-strips and a bulky sterile dressing are applied. The limb is placed into a knee immobilizer and the patient is awakened and transported to recovery in stable condition.      Please note that a surgical assistant was a medical necessity for this procedure in order to perform it in a safe and expeditious manner. Surgical assistant was necessary to retract the ligaments and vital neurovascular structures to prevent injury to them and also necessary for proper positioning of the limb to allow for anatomic placement of the prosthesis.   Dione Plover Glendia Olshefski, MD    01/06/2018, 1:01 PM

## 2018-01-06 NOTE — Anesthesia Postprocedure Evaluation (Signed)
Anesthesia Post Note  Patient: Julie Fischer  Procedure(s) Performed: RIGHT TOTAL KNEE ARTHROPLASTY (Right Knee)     Patient location during evaluation: PACU Anesthesia Type: Spinal Level of consciousness: oriented and awake and alert Pain management: pain level controlled Vital Signs Assessment: post-procedure vital signs reviewed and stable Respiratory status: spontaneous breathing, respiratory function stable, nonlabored ventilation and patient connected to nasal cannula oxygen Cardiovascular status: blood pressure returned to baseline and stable Postop Assessment: no headache, no backache, no apparent nausea or vomiting, patient able to bend at knees and spinal receding Anesthetic complications: no    Last Vitals:  Vitals:   01/06/18 1445 01/06/18 1453  BP: 117/67 114/72  Pulse: 68 65  Resp: 15 17  Temp: 36.4 C 36.6 C  SpO2: 97% 100%    Last Pain:  Vitals:   01/06/18 1453  TempSrc:   PainSc: 0-No pain                 Naphtali Riede A.

## 2018-01-06 NOTE — Anesthesia Procedure Notes (Signed)
Spinal  Patient location during procedure: OR End time: 01/06/2018 11:55 AM Staffing Performed: resident/CRNA  Preanesthetic Checklist Completed: patient identified, site marked, surgical consent, pre-op evaluation, timeout performed, IV checked, risks and benefits discussed and monitors and equipment checked Spinal Block Patient position: sitting Prep: DuraPrep Patient monitoring: heart rate, continuous pulse ox and blood pressure Location: L4-5 Injection technique: single-shot Needle Needle type: Pencan  Needle gauge: 24 G Needle length: 9 cm Additional Notes Expiration date of kit checked and confirmed. Patient tolerated procedure well, without complications.

## 2018-01-06 NOTE — Discharge Instructions (Signed)

## 2018-01-06 NOTE — Interval H&P Note (Signed)
History and Physical Interval Note:  01/06/2018 9:43 AM  Julie Fischer  has presented today for surgery, with the diagnosis of right knee osteoarthritis  The various methods of treatment have been discussed with the patient and family. After consideration of risks, benefits and other options for treatment, the patient has consented to  Procedure(s): RIGHT TOTAL KNEE ARTHROPLASTY (Right) as a surgical intervention .  The patient's history has been reviewed, patient examined, no change in status, stable for surgery.  I have reviewed the patient's chart and labs.  Questions were answered to the patient's satisfaction.     Pilar Plate Taji Barretto

## 2018-01-06 NOTE — Progress Notes (Signed)
AssistedDr. Foster with right, ultrasound guided, adductor canal block. Side rails up, monitors on throughout procedure. See vital signs in flow sheet. Tolerated Procedure well.  

## 2018-01-06 NOTE — Anesthesia Procedure Notes (Signed)
Anesthesia Regional Block: Adductor canal block   Pre-Anesthetic Checklist: ,, timeout performed, Correct Patient, Correct Site, Correct Laterality, Correct Procedure, Correct Position, site marked, Risks and benefits discussed,  Surgical consent,  Pre-op evaluation,  At surgeon's request and post-op pain management  Laterality: Right  Prep: chloraprep       Needles:  Injection technique: Single-shot  Needle Type: Echogenic Stimulator Needle     Needle Length: 10cm  Needle Gauge: 21   Needle insertion depth: 7 cm   Additional Needles:   Procedures:,,,, ultrasound used (permanent image in chart),,,,  Narrative:  Start time: 01/06/2018 11:01 AM End time: 01/06/2018 11:06 AM Injection made incrementally with aspirations every 5 mL.  Performed by: Personally  Anesthesiologist: Josephine Igo, MD  Additional Notes: Timeout performed. Patient sedated. Relevant anatomy ID'd using [place]. Incremental 2-87ml injection of LA with frequent aspiration. Patient tolerated procedure well.       Right Adductor Canal Block

## 2018-01-06 NOTE — Transfer of Care (Signed)
Immediate Anesthesia Transfer of Care Note  Patient: Julie Fischer  Procedure(s) Performed: RIGHT TOTAL KNEE ARTHROPLASTY (Right Knee)  Patient Location: PACU  Anesthesia Type:Spinal  Level of Consciousness: awake, alert , oriented and patient cooperative  Airway & Oxygen Therapy: Patient Spontanous Breathing and Patient connected to face mask oxygen  Post-op Assessment: Report given to RN and Post -op Vital signs reviewed and stable  Post vital signs: stable  Last Vitals:  Vitals Value Taken Time  BP 110/66 01/06/2018  1:22 PM  Temp    Pulse 69 01/06/2018  1:25 PM  Resp 18 01/06/2018  1:25 PM  SpO2 100 % 01/06/2018  1:25 PM  Vitals shown include unvalidated device data.  Last Pain:  Vitals:   01/06/18 0947  TempSrc:   PainSc: 2       Patients Stated Pain Goal: 4 (44/01/02 7253)  Complications: No apparent anesthesia complications

## 2018-01-06 NOTE — Anesthesia Preprocedure Evaluation (Addendum)
Anesthesia Evaluation  Patient identified by MRN, date of birth, ID band Patient awake    Reviewed: Allergy & Precautions, NPO status , Patient's Chart, lab work & pertinent test results  History of Anesthesia Complications (+) history of anesthetic complications  Airway Mallampati: II  TM Distance: >3 FB Neck ROM: Full    Dental no notable dental hx. (+) Teeth Intact   Pulmonary sleep apnea and Continuous Positive Airway Pressure Ventilation , former smoker,    Pulmonary exam normal breath sounds clear to auscultation       Cardiovascular hypertension, Pt. on medications Normal cardiovascular exam Rhythm:Regular Rate:Normal     Neuro/Psych PSYCHIATRIC DISORDERS Anxiety Depression negative neurological ROS     GI/Hepatic Neg liver ROS, GERD  Controlled and Medicated,  Endo/Other  diabetes, Well Controlled, Type 2, Oral Hypoglycemic AgentsObesity  Renal/GU negative Renal ROS  negative genitourinary   Musculoskeletal  (+) Arthritis , Osteoarthritis,  OA right knee   Abdominal (+) + obese,   Peds  Hematology  (+) anemia ,   Anesthesia Other Findings   Reproductive/Obstetrics                            Anesthesia Physical Anesthesia Plan  ASA: III  Anesthesia Plan: Spinal   Post-op Pain Management:  Regional for Post-op pain   Induction:   PONV Risk Score and Plan: 3 and Midazolam, Ondansetron, Propofol infusion and Treatment may vary due to age or medical condition  Airway Management Planned: Natural Airway and Simple Face Mask  Additional Equipment:   Intra-op Plan:   Post-operative Plan:   Informed Consent: I have reviewed the patients History and Physical, chart, labs and discussed the procedure including the risks, benefits and alternatives for the proposed anesthesia with the patient or authorized representative who has indicated his/her understanding and acceptance.    Dental advisory given  Plan Discussed with: CRNA and Surgeon  Anesthesia Plan Comments:         Anesthesia Quick Evaluation

## 2018-01-06 NOTE — Anesthesia Procedure Notes (Signed)
Procedure Name: MAC Date/Time: 01/06/2018 11:44 AM Performed by: Lissa Morales, CRNA Pre-anesthesia Checklist: Patient identified, Emergency Drugs available, Suction available, Patient being monitored and Timeout performed Patient Re-evaluated:Patient Re-evaluated prior to induction Oxygen Delivery Method: Simple face mask Placement Confirmation: positive ETCO2

## 2018-01-07 ENCOUNTER — Encounter (HOSPITAL_COMMUNITY): Payer: Self-pay | Admitting: Orthopedic Surgery

## 2018-01-07 LAB — BASIC METABOLIC PANEL
Anion gap: 9 (ref 5–15)
BUN: 15 mg/dL (ref 6–20)
CHLORIDE: 105 mmol/L (ref 98–111)
CO2: 27 mmol/L (ref 22–32)
CREATININE: 0.46 mg/dL (ref 0.44–1.00)
Calcium: 9.1 mg/dL (ref 8.9–10.3)
GFR calc Af Amer: >60 mL/min (ref >60)
GFR calc non Af Amer: >60 mL/min (ref >60)
GLUCOSE: 155 mg/dL — AB (ref 70–99)
Potassium: 4.7 mmol/L (ref 3.5–5.1)
SODIUM: 141 mmol/L (ref 135–145)

## 2018-01-07 LAB — CBC
HCT: 29.5 % — ABNORMAL LOW (ref 36.0–46.0)
HEMOGLOBIN: 10 g/dL — AB (ref 12.0–15.0)
MCH: 29 pg (ref 26.0–34.0)
MCHC: 33.9 g/dL (ref 30.0–36.0)
MCV: 85.5 fL (ref 78.0–100.0)
PLATELETS: 216 10*3/uL (ref 150–400)
RBC: 3.45 MIL/uL — ABNORMAL LOW (ref 3.87–5.11)
RDW: 14.5 % (ref 11.5–15.5)
WBC: 12.3 10*3/uL — ABNORMAL HIGH (ref 4.0–10.5)

## 2018-01-07 LAB — GLUCOSE, CAPILLARY
GLUCOSE-CAPILLARY: 129 mg/dL — AB (ref 70–99)
Glucose-Capillary: 216 mg/dL — ABNORMAL HIGH (ref 70–99)
Glucose-Capillary: 153 mg/dL — ABNORMAL HIGH (ref 70–99)
Glucose-Capillary: 137 mg/dL — ABNORMAL HIGH (ref 70–99)

## 2018-01-07 MED ORDER — SODIUM CHLORIDE 0.9 % IV BOLUS
250.0000 mL | Freq: Once | INTRAVENOUS | Status: AC
  Administered 2018-01-07: 07:00:00 via INTRAVENOUS

## 2018-01-07 NOTE — Progress Notes (Signed)
Spoke with patient and spouse at bedside. Confirmed plan for OP PT, already arranged. Has RW and 3n1. [phone] 

## 2018-01-07 NOTE — Progress Notes (Signed)
Subjective: 1 Day Post-Op Procedure(s) (LRB): RIGHT TOTAL KNEE ARTHROPLASTY (Right) Patient reports pain as moderate.   Patient seen in rounds by Dr. Wynelle Link. Patient is well, and has had no acute complaints or problems other than pain in the right knee. Denies chest pain, SOB or calf pain. Foley catheter removed this AM.  We will start therapy today.   Objective: Vital signs in last 24 hours: Temp:  [97.2 F (36.2 C)-98.6 F (37 C)] 97.2 F (36.2 C) (08/20 0639) Pulse Rate:  [58-84] 74 (08/20 0639) Resp:  [10-18] 16 (08/20 0639) BP: (98-128)/(64-98) 98/67 (08/20 0639) SpO2:  [96 %-100 %] 97 % (08/20 0639) Weight:  [62.6 kg] 62.6 kg (08/19 0947)  Intake/Output from previous day:  Intake/Output Summary (Last 24 hours) at 01/07/2018 0722 Last data filed at 01/07/2018 0200 Gross per 24 hour  Intake 4022.1 ml  Output 2480 ml  Net 1542.1 ml     Labs: Recent Labs    01/07/18 0506  HGB 10.0*   Recent Labs    01/07/18 0506  WBC 12.3*  RBC 3.45*  HCT 29.5*  PLT 216   Recent Labs    01/07/18 0506  NA 141  K 4.7  CL 105  CO2 27  BUN 15  CREATININE 0.46  GLUCOSE 155*  CALCIUM 9.1    Exam: General - Patient is Alert and Oriented Extremity - Neurologically intact Neurovascular intact Sensation intact distally Dorsiflexion/Plantar flexion intact Dressing - dressing C/D/I Motor Function - intact, moving foot and toes well on exam.   Past Medical History:  Diagnosis Date  . Allergy    fall seasonal  . Anxiety   . Blood transfusion    Platelet transfusion when on heparin  . Breast cancer ([place]) 2001   R breast(lumpectomy,chemo,radiation,tamoxifen)  . C. difficile diarrhea 02/2016   treated with Flagyl  . Complication of anesthesia    gets Jillyn Ledger going under she has been told  . Depression    treated  . Diabetes mellitus    type 2  . GERD (gastroesophageal reflux disease)   . Hyperlipidemia   . Hypertension    resolved after weight loss surgery;  recurred 2013  . Insomnia chronic  . Iron deficiency    h/o  . Microalbuminuria    h/o  . OA (osteoarthritis) of knee   . Osteoporosis   . Personal history of chemotherapy   . Personal history of radiation therapy   . Plantar fasciitis (07') [facility]   s/p surgical release 01/2011  . Sleep apnea    resolved after weight loss surgery; recurrent with weight gain; on CPAP  . Vitamin D deficiency     Assessment/Plan: 1 Day Post-Op Procedure(s) (LRB): RIGHT TOTAL KNEE ARTHROPLASTY (Right) Active Problems:   OA (osteoarthritis) of knee  Estimated body mass index is 30.94 kg/m as calculated from the following:   Height as of this encounter: 4\' 8"  (1.422 m).   Weight as of this encounter: 62.6 kg. Advance diet Up with therapy  Anticipated LOS equal to or greater than 2 midnights due to - Age 59 and older with one or more of the following:  - Obesity  - Expected need for hospital services (PT, OT, Nursing) required for safe  discharge  - Anticipated need for postoperative skilled nursing care or inpatient rehab  - Active co-morbidities: Diabetes OR   - Unanticipated findings during/Post Surgery: None  - Patient is a high risk of re-admission due to: None    DVT Prophylaxis -  Aspirin Weight bearing as tolerated. D/C O2 and pulse ox and try on room air. Hemovac pulled without difficulty, will begin therapy today.  Blood pressure low this AM, 250 mL bolus ordered.   Plan is to go Home after hospital stay. Possible discharge tomorrow if meeting goals and stable.  Theresa Duty, PA-C Orthopedic Surgery 01/07/2018, 7:22 AM

## 2018-01-07 NOTE — Plan of Care (Signed)
  Problem: Clinical Measurements: Goal: Ability to maintain clinical measurements within normal limits will improve Outcome: Progressing   Problem: Clinical Measurements: Goal: Will remain free from infection Outcome: Progressing   Problem: Clinical Measurements: Goal: Diagnostic test results will improve Outcome: Progressing   Problem: Clinical Measurements: Goal: Respiratory complications will improve Outcome: Progressing   Problem: Clinical Measurements: Goal: Cardiovascular complication will be avoided Outcome: Progressing   Problem: Activity: Goal: Risk for activity intolerance will decrease Outcome: Progressing   Problem: Nutrition: Goal: Adequate nutrition will be maintained Outcome: Progressing

## 2018-01-07 NOTE — Evaluation (Signed)
Physical Therapy Evaluation Patient Details Name: Julie Fischer MRN: [id number] DOB: [birth year] Today's Date: 01/07/2018   History of Present Illness  Pt is a 57 year old female s/p R TKA with hx of L TKA and breast cancer   Clinical Impression  Pt is s/p TKA resulting in the deficits listed below (see PT Problem List).  Pt will benefit from skilled PT to increase their independence and safety with mobility to allow discharge to the venue listed below.  Pt reports decreased sensation in R LE this morning so agreeable to transfer to recliner only.  Pt also performed LE exercises.  Pt hopeful to ambulate in hallway this afternoon.  Pt plans to d/c home likely tomorrow and f/u with OPPT.     Follow Up Recommendations Outpatient PT;Follow surgeon's recommendation for DC plan and follow-up therapies    Equipment Recommendations  None recommended by PT    Recommendations for Other Services       Precautions / Restrictions Precautions Precautions: Fall;Knee Required Braces or Orthoses: Knee Immobilizer - Right Restrictions Other Position/Activity Restrictions: WBAT      Mobility  Bed Mobility Overal bed mobility: Needs Assistance Bed Mobility: Supine to Sit     Supine to sit: Min assist     General bed mobility comments: assist for R LE  Transfers Overall transfer level: Needs assistance Equipment used: Rolling walker (2 wheeled) Transfers: Sit to/from [facility] Sit to Stand: Min guard Stand pivot transfers: Min guard       General transfer comment: verbal cues for UE and LE positioning, pt kept R LE from floor due to decreased sensation and fearful of buckling despite KI in place (buckled earlier this morning with nursing); only transferred to recliner per pt request (due to decr sensation)  Ambulation/Gait                Stairs            Wheelchair Mobility    Modified Rankin (Stroke Patients Only)       Balance                                              Pertinent Vitals/Pain Pain Assessment: 0-10 Pain Score: 2  Pain Location: R knee Pain Descriptors / Indicators: Sore;Aching Pain Intervention(s): Limited activity within patient's tolerance;Repositioned;Monitored during session    Home Living Family/patient expects to be discharged to:: Private residence Living Arrangements: Spouse/significant other Available Help at Discharge: Family;Available 24 hours/day Type of Home: House Home Access: Level entry     Home Layout: Two level;Bed/bath upstairs Home Equipment: Crutches;Cane - single point;Walker - 2 wheels;Bedside commode      Prior Function Level of Independence: Independent               Hand Dominance        Extremity/Trunk Assessment        Lower Extremity Assessment Lower Extremity Assessment: RLE deficits/detail RLE Deficits / Details: unable to perform SLR without lag, pt reports dull sensation, describes 'feels like I'm stepping on a marshmellow' when standing with nursing earlier this morning       Communication      Cognition Arousal/Alertness: Awake/alert Behavior During Therapy: WFL for tasks assessed/performed Overall Cognitive Status: Within Functional Limits for tasks assessed  General Comments      Exercises Total Joint Exercises Ankle Circles/Pumps: AROM;10 reps;Both Quad Sets: AROM;10 reps;Both Short Arc Quad: AROM;10 reps;Right Heel Slides: 10 reps;AAROM;Right Hip ABduction/ADduction: AROM;10 reps;Right Straight Leg Raises: AAROM;10 reps;Right Goniometric ROM: approx -8-45* AAROM R knee   Assessment/Plan    PT Assessment Patient needs continued PT services  PT Problem List Decreased strength;Decreased mobility;Decreased activity tolerance;Decreased knowledge of use of DME;Pain;Decreased range of motion;Decreased knowledge of precautions       PT Treatment Interventions  Stair training;Gait training;Therapeutic exercise;DME instruction;Therapeutic activities;Functional mobility training;Patient/family education    PT Goals (Current goals can be found in the Care Plan section)  Acute Rehab PT Goals PT Goal Formulation: With patient Time For Goal Achievement: 01/11/18 Potential to Achieve Goals: Good    Frequency 7X/week   Barriers to discharge        Co-evaluation               AM-PAC PT "6 Clicks" Daily Activity  Outcome Measure Difficulty turning over in bed (including adjusting bedclothes, sheets and blankets)?: A Little Difficulty moving from lying on back to sitting on the side of the bed? : Unable Difficulty sitting down on and standing up from a chair with arms (e.g., wheelchair, bedside commode, etc,.)?: Unable Help needed moving to and from a bed to chair (including a wheelchair)?: A Little Help needed walking in hospital room?: A Lot Help needed climbing 3-5 steps with a railing? : Total 6 Click Score: 11    End of Session Equipment Utilized During Treatment: Gait belt;Right knee immobilizer Activity Tolerance: Patient tolerated treatment well Patient left: in chair;with call bell/phone within reach Nurse Communication: Mobility status PT Visit Diagnosis: Other abnormalities of gait and mobility (R26.89)    Time: 1002-1020 PT Time Calculation (min) (ACUTE ONLY): 18 min   Charges:   PT Evaluation $PT Eval Low Complexity: 1 Low          Carmelia Bake, PT, DPT 01/07/2018 Pager: [phone]  York Ram E 01/07/2018, 1:22 PM

## 2018-01-07 NOTE — Progress Notes (Signed)
Physical Therapy Treatment Patient Details Name: Julie Fischer MRN: [id number] DOB: 1961/04/05 Today's Date: 01/07/2018    History of Present Illness Pt is a 57 year old female s/p R TKA with hx of L TKA and breast cancer     PT Comments    Pt reports "marshmellow" leg and observed R knee buckling (even with KI) during weight bearing and ambulation.  Pt reports light touch feels equal on plantar and dorsal feet.  Pt with limited ambulation distance due to buckling and required seated rest break due to "dizziness" and then reported more likely due to her nausea.  Symptoms improved upon return to bed.    Follow Up Recommendations  Outpatient PT;Follow surgeon's recommendation for DC plan and follow-up therapies     Equipment Recommendations  None recommended by PT    Recommendations for Other Services       Precautions / Restrictions Precautions Precautions: Fall;Knee Precaution Comments: R knee buckling Required Braces or Orthoses: Knee Immobilizer - Right Restrictions Other Position/Activity Restrictions: WBAT    Mobility  Bed Mobility Overal bed mobility: Needs Assistance Bed Mobility: Supine to Sit;Sit to Supine     Supine to sit: Min guard Sit to supine: Min guard   General bed mobility comments: verbal cues for self assist  Transfers Overall transfer level: Needs assistance Equipment used: Rolling walker (2 wheeled) Transfers: Sit to/from Stand Sit to Stand: Min guard Stand pivot transfers: Min guard       General transfer comment: verbal cues for UE and LE positioning, min/guard for safety  Ambulation/Gait Ambulation/Gait assistance: Min assist;Mod assist Gait Distance (Feet): 16 Feet Assistive device: Rolling walker (2 wheeled) Gait Pattern/deviations: Step-to pattern;Decreased stance time - right     General Gait Details: pt attempting to take all weight on R LE and buckling observed so cues for using UEs through RW with any R LE weight bearing  to assist; pt with dizziness and required seated rest break after 8 feet, pt then reports likely due to more nausea and able to ambulate back to room; variable R knee buckling even with KI throughout gait; vitals obtained and in docflowsheet upon pt returning to bed [facility])   Stairs             Wheelchair Mobility    Modified Rankin (Stroke Patients Only)       Balance                                            Cognition Arousal/Alertness: Awake/alert Behavior During Therapy: WFL for tasks assessed/performed Overall Cognitive Status: Within Functional Limits for tasks assessed                                        Exercises Total Joint Exercises Ankle Circles/Pumps: AROM;10 reps;Both Quad Sets: AROM;10 reps;Both Short Arc Quad: AROM;10 reps;Right Heel Slides: 10 reps;AAROM;Right Hip ABduction/ADduction: AROM;10 reps;Right Straight Leg Raises: AAROM;10 reps;Right Goniometric ROM: approx -8-45* AAROM R knee    General Comments        Pertinent Vitals/Pain Pain Assessment: 0-10 Pain Score: 2  Pain Location: R knee Pain Descriptors / Indicators: Sore;Aching Pain Intervention(s): Limited activity within patient's tolerance;Repositioned;Monitored during session    Home Living Family/patient expects to be discharged to:: Private residence Living Arrangements: Spouse/significant other  Available Help at Discharge: Family;Available 24 hours/day Type of Home: House Home Access: Level entry   Home Layout: Two level;Bed/bath upstairs Home Equipment: Crutches;Cane - single point;Walker - 2 wheels;Bedside commode      Prior Function Level of Independence: Independent          PT Goals (current goals can now be found in the care plan section) Acute Rehab PT Goals PT Goal Formulation: With patient Time For Goal Achievement: 01/11/18 Potential to Achieve Goals: Good Progress towards PT goals: Progressing toward goals     Frequency    7X/week      PT Plan Current plan remains appropriate    Co-evaluation              AM-PAC PT "6 Clicks" Daily Activity  Outcome Measure  Difficulty turning over in bed (including adjusting bedclothes, sheets and blankets)?: A Little Difficulty moving from lying on back to sitting on the side of the bed? : A Little Difficulty sitting down on and standing up from a chair with arms (e.g., wheelchair, bedside commode, etc,.)?: Unable Help needed moving to and from a bed to chair (including a wheelchair)?: A Little Help needed walking in hospital room?: A Lot Help needed climbing 3-5 steps with a railing? : Total 6 Click Score: 13    End of Session Equipment Utilized During Treatment: Gait belt;Right knee immobilizer Activity Tolerance: Other (comment)(limited by knee buckling) Patient left: with call bell/phone within reach;in bed;with family/visitor present Nurse Communication: Mobility status PT Visit Diagnosis: Other abnormalities of gait and mobility (R26.89)     Time: 6438-3818 PT Time Calculation (min) (ACUTE ONLY): 17 min  Charges:  $Gait Training: 8-22 mins                    Carmelia Bake, PT, DPT 01/07/2018 Pager: [phone]  York Ram E 01/07/2018, 3:48 PM

## 2018-01-08 LAB — BASIC METABOLIC PANEL
Anion gap: 7 (ref 5–15)
BUN: 13 mg/dL (ref 6–20)
CALCIUM: 9.4 mg/dL (ref 8.9–10.3)
CO2: 29 mmol/L (ref 22–32)
CREATININE: 0.59 mg/dL (ref 0.44–1.00)
Chloride: 103 mmol/L (ref 98–111)
GFR calc non Af Amer: >60 mL/min (ref >60)
GLUCOSE: 133 mg/dL — AB (ref 70–99)
Potassium: 4.3 mmol/L (ref 3.5–5.1)
Sodium: 139 mmol/L (ref 135–145)

## 2018-01-08 LAB — CBC
HCT: 27.6 % — ABNORMAL LOW (ref 36.0–46.0)
Hemoglobin: 9.3 g/dL — ABNORMAL LOW (ref 12.0–15.0)
MCH: 28.8 pg (ref 26.0–34.0)
MCHC: 33.7 g/dL (ref 30.0–36.0)
MCV: 85.4 fL (ref 78.0–100.0)
Platelets: 198 10*3/uL (ref 150–400)
RBC: 3.23 MIL/uL — ABNORMAL LOW (ref 3.87–5.11)
RDW: 14.7 % (ref 11.5–15.5)
WBC: 12 10*3/uL — ABNORMAL HIGH (ref 4.0–10.5)

## 2018-01-08 MED ORDER — OXYCODONE HCL 5 MG PO TABS: 5.0000 mg | ORAL_TABLET | Freq: Four times a day (QID) | ORAL | 0 refills | Status: DC | PRN

## 2018-01-08 MED ORDER — METHOCARBAMOL 500 MG PO TABS: 500.0000 mg | ORAL_TABLET | Freq: Four times a day (QID) | ORAL | 0 refills | Status: DC | PRN

## 2018-01-08 MED ORDER — ASPIRIN 325 MG PO TBEC: 325.0000 mg | DELAYED_RELEASE_TABLET | Freq: Two times a day (BID) | ORAL | 0 refills | Status: AC

## 2018-01-08 MED ORDER — GABAPENTIN 300 MG PO CAPS: 300.0000 mg | ORAL_CAPSULE | Freq: Three times a day (TID) | ORAL | 0 refills | Status: DC

## 2018-01-08 MED ORDER — TRAMADOL HCL 50 MG PO TABS: 50.0000 mg | ORAL_TABLET | Freq: Four times a day (QID) | ORAL | 0 refills | Status: DC | PRN

## 2018-01-08 NOTE — Progress Notes (Signed)
Pt was provided with discharge instructions and prescriptions. After discussion the pt's discharge plan of care and prescriptions, the pt reports no further questions or concerns.

## 2018-01-08 NOTE — Progress Notes (Signed)
Physical Therapy Treatment Patient Details Name: Julie Fischer MRN: [id number] DOB: [birth year] Today's Date: 01/08/2018    History of Present Illness Pt is a 57 year old female s/p R TKA with hx of L TKA and breast cancer     PT Comments    Pt ambulated in hallway and practiced safe stair technique with spouse present.  Pt also performed LE exercises and provided with HEP.  Pt ready for d/c home today.   Follow Up Recommendations  Outpatient PT;Follow surgeon's recommendation for DC plan and follow-up therapies     Equipment Recommendations  None recommended by PT    Recommendations for Other Services       Precautions / Restrictions Precautions Precautions: Fall;Knee Required Braces or Orthoses: Knee Immobilizer - Right Restrictions RLE Weight Bearing: Weight bearing as tolerated    Mobility  Bed Mobility Overal bed mobility: Needs Assistance Bed Mobility: Supine to Sit;Sit to Supine     Supine to sit: Min guard Sit to supine: Min guard   General bed mobility comments: verbal cues for self assist  Transfers Overall transfer level: Needs assistance Equipment used: Rolling walker (2 wheeled) Transfers: Sit to/from Stand Sit to Stand: Min guard         General transfer comment: verbal cues for UE and LE positioning, min/guard for safety  Ambulation/Gait Ambulation/Gait assistance: Min guard Gait Distance (Feet): 80 Feet Assistive device: Rolling walker (2 wheeled) Gait Pattern/deviations: Step-to pattern;Decreased stance time - right     General Gait Details: pt pain improved and she tolerated ambulation better, no buckling observed, cues for RW positioning and step length   Stairs Stairs: Yes Stairs assistance: Min guard Stair Management: Step to pattern;Sideways;One rail Right Number of Stairs: 3 General stair comments: verbal cues for safety and sequence, pt has flight to bedroom at home with one rail, performed sideways with one rail well  however also has [place] at home if needed, spouse present and observed   Wheelchair Mobility    Modified Rankin (Stroke Patients Only)       Balance                                            Cognition Arousal/Alertness: Awake/alert Behavior During Therapy: WFL for tasks assessed/performed;Anxious Overall Cognitive Status: Within Functional Limits for tasks assessed                                        Exercises Total Joint Exercises Ankle Circles/Pumps: AROM;10 reps;Both Quad Sets: AROM;10 reps;Both Short Arc Quad: 10 reps;Right;AAROM Heel Slides: 10 reps;AAROM;Right Hip ABduction/ADduction: 10 reps;Right;AAROM Straight Leg Raises: AAROM;10 reps;Right    General Comments        Pertinent Vitals/Pain Pain Assessment: 0-10 Pain Score: 6  Pain Location: R knee Pain Descriptors / Indicators: Sore;Aching Pain Intervention(s): Limited activity within patient's tolerance;Repositioned;Monitored during session;Ice applied    Home Living                      Prior Function            PT Goals (current goals can now be found in the care plan section) Progress towards PT goals: Progressing toward goals    Frequency    7X/week      PT Plan  Current plan remains appropriate    Co-evaluation              AM-PAC PT "6 Clicks" Daily Activity  Outcome Measure  Difficulty turning over in bed (including adjusting bedclothes, sheets and blankets)?: A Little Difficulty moving from lying on back to sitting on the side of the bed? : A Little Difficulty sitting down on and standing up from a chair with arms (e.g., wheelchair, bedside commode, etc,.)?: A Little Help needed moving to and from a bed to chair (including a wheelchair)?: A Little Help needed walking in hospital room?: A Little Help needed climbing 3-5 steps with a railing? : A Little 6 Click Score: 18    End of Session Equipment Utilized During Treatment: Gait  belt;Right knee immobilizer Activity Tolerance: Patient tolerated treatment well Patient left: with call bell/phone within reach;in bed;with family/visitor present Nurse Communication: Mobility status PT Visit Diagnosis: Other abnormalities of gait and mobility (R26.89)     Time: 3235-5732 PT Time Calculation (min) (ACUTE ONLY): 20 min  Charges:  $Gait Training: 8-22 mins                    Carmelia Bake, PT, DPT 01/08/2018 Pager: [phone]  York Ram E 01/08/2018, 1:15 PM

## 2018-01-08 NOTE — Progress Notes (Signed)
Physical Therapy Treatment Patient Details Name: Julie Fischer MRN: [id number] DOB: [birth year] Today's Date: 01/08/2018    History of Present Illness Pt is a 57 year old female s/p R TKA with hx of L TKA and breast cancer     PT Comments    Upon entering room, pt became tearful and reports having a rough time with pain since block has worn off.  Pt also reports a little anxiety.  Pt assisted with ambulating short distance however will return to perform exercises (per pt preference) and practice steps prior to d/c.   Follow Up Recommendations  Outpatient PT;Follow surgeon's recommendation for DC plan and follow-up therapies     Equipment Recommendations  None recommended by PT    Recommendations for Other Services       Precautions / Restrictions Precautions Precautions: Fall;Knee Required Braces or Orthoses: Knee Immobilizer - Right Restrictions RLE Weight Bearing: Weight bearing as tolerated    Mobility  Bed Mobility Overal bed mobility: Needs Assistance Bed Mobility: Supine to Sit;Sit to Supine     Supine to sit: Min guard Sit to supine: Min assist   General bed mobility comments: verbal cues for self assist  Transfers Overall transfer level: Needs assistance Equipment used: Rolling walker (2 wheeled) Transfers: Sit to/from Stand Sit to Stand: Min guard         General transfer comment: verbal cues for UE and LE positioning, min/guard for safety  Ambulation/Gait Ambulation/Gait assistance: Min guard Gait Distance (Feet): 60 Feet Assistive device: Rolling walker (2 wheeled) Gait Pattern/deviations: Step-to pattern;Decreased stance time - right     General Gait Details: pt reports 8/10 pain with ambulation so performed distance to tolerance, pt reports full sensation in R LE at this time    Stairs             Wheelchair Mobility    Modified Rankin (Stroke Patients Only)       Balance                                             Cognition Arousal/Alertness: Awake/alert Behavior During Therapy: WFL for tasks assessed/performed;Anxious Overall Cognitive Status: Within Functional Limits for tasks assessed                                        Exercises      General Comments        Pertinent Vitals/Pain Pain Assessment: 0-10 Pain Score: 8  Pain Location: R knee Pain Descriptors / Indicators: Sore;Aching Pain Intervention(s): Limited activity within patient's tolerance;Monitored during session;Repositioned;Premedicated before session    Home Living                      Prior Function            PT Goals (current goals can now be found in the care plan section) Progress towards PT goals: Progressing toward goals    Frequency    7X/week      PT Plan Current plan remains appropriate    Co-evaluation              AM-PAC PT "6 Clicks" Daily Activity  Outcome Measure  Difficulty turning over in bed (including adjusting bedclothes, sheets and blankets)?: A Little Difficulty moving from  lying on back to sitting on the side of the bed? : A Little Difficulty sitting down on and standing up from a chair with arms (e.g., wheelchair, bedside commode, etc,.)?: A Little Help needed moving to and from a bed to chair (including a wheelchair)?: A Little Help needed walking in hospital room?: A Little Help needed climbing 3-5 steps with a railing? : A Little 6 Click Score: 18    End of Session Equipment Utilized During Treatment: Gait belt;Right knee immobilizer Activity Tolerance: Patient limited by pain Patient left: with call bell/phone within reach;in bed Nurse Communication: Mobility status PT Visit Diagnosis: Other abnormalities of gait and mobility (R26.89)     Time: 5726-2035 PT Time Calculation (min) (ACUTE ONLY): 13 min  Charges:  $Gait Training: 8-22 mins                     Carmelia Bake, PT, DPT 01/08/2018 Pager: [phone]  York Ram  E 01/08/2018, 1:07 PM

## 2018-01-08 NOTE — Progress Notes (Signed)
Subjective: 2 Days Post-Op Procedure(s) (LRB): RIGHT TOTAL KNEE ARTHROPLASTY (Right) Patient reports pain as moderate.   Patient seen in rounds for Dr. Wynelle Link. Patient is well this AM, but had some issues with the right knee buckling yesterday while ambulating with therapy. She believes this has since improved. Voiding without difficulty and positive flatus. Denies chest pain, SOB or calf pain. States she is ready to go home pending progress with therapy today. Plan is to go Home after hospital stay.  Objective: Vital signs in last 24 hours: Temp:  [97.4 F (36.3 C)-98.4 F (36.9 C)] 98.1 F (36.7 C) (08/21 0620) Pulse Rate:  [66-80] 73 (08/21 0620) Resp:  [16-20] 18 (08/21 0620) BP: (106-131)/(61-71) 115/68 (08/21 0620) SpO2:  [92 %-100 %] 94 % (08/21 0620)  Intake/Output from previous day:  Intake/Output Summary (Last 24 hours) at 01/08/2018 0800 Last data filed at 01/08/2018 0600 Gross per 24 hour  Intake 2020.01 ml  Output 1550 ml  Net 470.01 ml    Labs: Recent Labs    01/07/18 0506 01/08/18 0526  HGB 10.0* 9.3*   Recent Labs    01/07/18 0506 01/08/18 0526  WBC 12.3* 12.0*  RBC 3.45* 3.23*  HCT 29.5* 27.6*  PLT 216 198   Recent Labs    01/07/18 0506 01/08/18 0526  NA 141 139  K 4.7 4.3  CL 105 103  CO2 27 29  BUN 15 13  CREATININE 0.46 0.59  GLUCOSE 155* 133*  CALCIUM 9.1 9.4   Exam: General - Patient is Alert and Oriented Extremity - Neurologically intact Neurovascular intact Sensation intact distally Dorsiflexion/Plantar flexion intact Dressing/Incision - clean, dry, no drainage Motor Function - intact, moving foot and toes well on exam.   Past Medical History:  Diagnosis Date  . Allergy    fall seasonal  . Anxiety   . Blood transfusion    Platelet transfusion when on heparin  . Breast cancer ([place]) 2001   R breast(lumpectomy,chemo,radiation,tamoxifen)  . C. difficile diarrhea 02/2016   treated with Flagyl  . Complication of  anesthesia    gets Jillyn Ledger going under she has been told  . Depression    treated  . Diabetes mellitus    type 2  . GERD (gastroesophageal reflux disease)   . Hyperlipidemia   . Hypertension    resolved after weight loss surgery; recurred 2013  . Insomnia chronic  . Iron deficiency    h/o  . Microalbuminuria    h/o  . OA (osteoarthritis) of knee   . Osteoporosis   . Personal history of chemotherapy   . Personal history of radiation therapy   . Plantar fasciitis (07') [facility]   s/p surgical release 01/2011  . Sleep apnea    resolved after weight loss surgery; recurrent with weight gain; on CPAP  . Vitamin D deficiency     Assessment/Plan: 2 Days Post-Op Procedure(s) (LRB): RIGHT TOTAL KNEE ARTHROPLASTY (Right) Active Problems:   OA (osteoarthritis) of knee  Estimated body mass index is 30.94 kg/m as calculated from the following:   Height as of this encounter: 4\' 8"  (1.422 m).   Weight as of this encounter: 62.6 kg. Up with therapy D/C IV fluids  DVT Prophylaxis - Aspirin Weight-bearing as tolerated  Possible discharge this afternoon after two sessions of therapy if she is meeting her goals and having no further issues with the right knee buckling. BP improved from yesterday after bolus. She is scheduled for outpatient PT at [facility] beginning Friday, and will  follow-up in the office with Dr. Wynelle Link on September 3rd.   Theresa Duty, PA-C Orthopedic Surgery 01/08/2018, 8:00 AM

## 2018-01-09 NOTE — Discharge Summary (Signed)
Physician Discharge Summary   Patient ID: Julie Fischer MRN: [id number] DOB/AGE: [birth year] 57 y.o.  Admit date: 01/06/2018 Discharge date: 01/08/2018  Primary Diagnosis: Osteoarthritis, right knee   Admission Diagnoses:  Past Medical History:  Diagnosis Date  . Allergy    fall seasonal  . Anxiety   . Blood transfusion    Platelet transfusion when on heparin  . Breast cancer ([place]) 2001   R breast(lumpectomy,chemo,radiation,tamoxifen)  . C. difficile diarrhea 02/2016   treated with Flagyl  . Complication of anesthesia    gets Jillyn Ledger going under she has been told  . Depression    treated  . Diabetes mellitus    type 2  . GERD (gastroesophageal reflux disease)   . Hyperlipidemia   . Hypertension    resolved after weight loss surgery; recurred 2013  . Insomnia chronic  . Iron deficiency    h/o  . Microalbuminuria    h/o  . OA (osteoarthritis) of knee   . Osteoporosis   . Personal history of chemotherapy   . Personal history of radiation therapy   . Plantar fasciitis (07') [facility]   s/p surgical release 01/2011  . Sleep apnea    resolved after weight loss surgery; recurrent with weight gain; on CPAP  . Vitamin D deficiency    Discharge Diagnoses:   Active Problems:   OA (osteoarthritis) of knee  Estimated body mass index is 30.94 kg/m as calculated from the following:   Height as of this encounter: 4' 8"  (1.422 m).   Weight as of this encounter: 62.6 kg.  Procedure:  Procedure(s) (LRB): RIGHT TOTAL KNEE ARTHROPLASTY (Right)   Consults: None  HPI: Julie Fischer is a 57 y.o. year old female with end stage OA of her right knee with progressively worsening pain and dysfunction. She has constant pain, with activity and at rest and significant functional deficits with difficulties even with ADLs. She has had extensive non-op management including analgesics, injections of cortisone and viscosupplements, and home exercise program, but remains in significant pain  with significant dysfunction.Radiographs show bone on bone arthritis medial and patellofemoral. She presents now for right Total Knee Arthroplasty.    Laboratory Data: Admission on 01/06/2018, Discharged on 01/08/2018  Component Date Value Ref Range Status  . Glucose-Capillary 01/06/2018 109* 70 - 99 mg/dL Final  . Glucose-Capillary 01/06/2018 79  70 - 99 mg/dL Final  . Glucose-Capillary 01/06/2018 123* 70 - 99 mg/dL Final  . WBC 01/07/2018 12.3* 4.0 - 10.5 K/uL Final  . RBC 01/07/2018 3.45* 3.87 - 5.11 MIL/uL Final  . Hemoglobin 01/07/2018 10.0* 12.0 - 15.0 g/dL Final  . HCT 01/07/2018 29.5* 36.0 - 46.0 % Final  . MCV 01/07/2018 85.5  78.0 - 100.0 fL Final  . MCH 01/07/2018 29.0  26.0 - 34.0 pg Final  . MCHC 01/07/2018 33.9  30.0 - 36.0 g/dL Final  . RDW 01/07/2018 14.5  11.5 - 15.5 % Final  . Platelets 01/07/2018 216  150 - 400 K/uL Final   Performed at [facility], [place] [address], [place], [place] [address]  . Sodium 01/07/2018 141  135 - 145 mmol/L Final  . Potassium 01/07/2018 4.7  3.5 - 5.1 mmol/L Final  . Chloride 01/07/2018 105  98 - 111 mmol/L Final  . CO2 01/07/2018 27  22 - 32 mmol/L Final  . Glucose, Bld 01/07/2018 155* 70 - 99 mg/dL Final  . BUN 01/07/2018 15  6 - 20 mg/dL Final  . Creatinine, Ser 01/07/2018 0.46  0.44 - 1.00 mg/dL Final  . Calcium 01/07/2018 9.1  8.9 - 10.3 mg/dL Final  . GFR calc non Af Amer 01/07/2018 >60  >60 mL/min Final  . GFR calc Af Amer 01/07/2018 >60  >60 mL/min Final   Comment: (NOTE) The eGFR has been calculated using the CKD EPI equation. This calculation has not been validated in all clinical situations. eGFR's persistently <60 mL/min signify possible Chronic Kidney Disease.   Georgiann Hahn gap 01/07/2018 9  5 - 15 Final   Performed at [facility], [place] [address] [place], [place] [address]  . Glucose-Capillary 01/06/2018 258* 70 - 99 mg/dL Final  . Glucose-Capillary 01/07/2018 129* 70 - 99 mg/dL Final    . Glucose-Capillary 01/07/2018 216* 70 - 99 mg/dL Final  . Glucose-Capillary 01/07/2018 153* 70 - 99 mg/dL Final  . WBC 01/08/2018 12.0* 4.0 - 10.5 K/uL Final  . RBC 01/08/2018 3.23* 3.87 - 5.11 MIL/uL Final  . Hemoglobin 01/08/2018 9.3* 12.0 - 15.0 g/dL Final  . HCT 01/08/2018 27.6* 36.0 - 46.0 % Final  . MCV 01/08/2018 85.4  78.0 - 100.0 fL Final  . MCH 01/08/2018 28.8  26.0 - 34.0 pg Final  . MCHC 01/08/2018 33.7  30.0 - 36.0 g/dL Final  . RDW 01/08/2018 14.7  11.5 - 15.5 % Final  . Platelets 01/08/2018 198  150 - 400 K/uL Final   Performed at [facility], [place] [address] [place], [place] [address]  . Sodium 01/08/2018 139  135 - 145 mmol/L Final  . Potassium 01/08/2018 4.3  3.5 - 5.1 mmol/L Final  . Chloride 01/08/2018 103  98 - 111 mmol/L Final  . CO2 01/08/2018 29  22 - 32 mmol/L Final  . Glucose, Bld 01/08/2018 133* 70 - 99 mg/dL Final  . BUN 01/08/2018 13  6 - 20 mg/dL Final  . Creatinine, Ser 01/08/2018 0.59  0.44 - 1.00 mg/dL Final  . Calcium 01/08/2018 9.4  8.9 - 10.3 mg/dL Final  . GFR calc non Af Amer 01/08/2018 >60  >60 mL/min Final  . GFR calc Af Amer 01/08/2018 >60  >60 mL/min Final   Comment: (NOTE) The eGFR has been calculated using the CKD EPI equation. This calculation has not been validated in all clinical situations. eGFR's persistently <60 mL/min signify possible Chronic Kidney Disease.   Georgiann Hahn gap 01/08/2018 7  5 - 15 Final   Performed at [facility], [place] [address] [place], [place] [address]  . Glucose-Capillary 01/07/2018 137* 70 - 99 mg/dL Final  Hospital Outpatient Visit on 12/30/2017  Component Date Value Ref Range Status  . Glucose-Capillary 12/30/2017 78  70 - 99 mg/dL Final  . MRSA, PCR 12/30/2017 NEGATIVE  NEGATIVE Final  . Staphylococcus aureus 12/30/2017 NEGATIVE  NEGATIVE Final   Comment: (NOTE) The Xpert SA Assay (FDA approved for NASAL specimens in patients 36 years of age and older), is one  component of a comprehensive surveillance program. It is not intended to diagnose infection nor to guide or monitor treatment. Performed at [facility], [place] [address] [place], [place] [address]   . aPTT 12/30/2017 27  24 - 36 seconds Final   Performed at [facility], [place] [address], [place],  [address]  . WBC 12/30/2017 6.5  4.0 - 10.5 K/uL Final  . RBC 12/30/2017 4.28  3.87 - 5.11 MIL/uL Final  . Hemoglobin 12/30/2017 12.0  12.0 - 15.0 g/dL Final  . HCT 12/30/2017 36.6  36.0 - 46.0 % Final  . MCV 12/30/2017 85.5  78.0 - 100.0 fL Final  . MCH 12/30/2017 28.0  26.0 - 34.0 pg Final  . MCHC 12/30/2017 32.8  30.0 - 36.0 g/dL Final  . RDW 12/30/2017 15.2  11.5 - 15.5 % Final  . Platelets 12/30/2017 245  150 - 400 K/uL Final   Performed at [facility] [place], [place] [address], [place], [place] [address]  . Sodium 12/30/2017 142  135 - 145 mmol/L Final  . Potassium 12/30/2017 5.1  3.5 - 5.1 mmol/L Final  . Chloride 12/30/2017 107  98 - 111 mmol/L Final  . CO2 12/30/2017 28  22 - 32 mmol/L Final  . Glucose, Bld 12/30/2017 74  70 - 99 mg/dL Final  . BUN 12/30/2017 15  6 - 20 mg/dL Final  . Creatinine, Ser 12/30/2017 0.51  0.44 - 1.00 mg/dL Final  . Calcium 12/30/2017 9.7  8.9 - 10.3 mg/dL Final  . Total Protein 12/30/2017 6.9  6.5 - 8.1 g/dL Final  . Albumin 12/30/2017 4.1  3.5 - 5.0 g/dL Final  . AST 12/30/2017 27  15 - 41 U/L Final  . ALT 12/30/2017 29  0 - 44 U/L Final  . Alkaline Phosphatase 12/30/2017 40  38 - 126 U/L Final  . Total Bilirubin 12/30/2017 0.5  0.3 - 1.2 mg/dL Final  . GFR calc non Af Amer 12/30/2017 >60  >60 mL/min Final  . GFR calc Af Amer 12/30/2017 >60  >60 mL/min Final   Comment: (NOTE) The eGFR has been calculated using the CKD EPI equation. This calculation has not been validated in all clinical situations. eGFR's persistently <60 mL/min signify possible Chronic Kidney Disease.   Georgiann Hahn gap  12/30/2017 7  5 - 15 Final   Performed at [facility], [place] [address] [place], [place] [address]  . Prothrombin Time 12/30/2017 13.4  11.4 - 15.2 seconds Final  . INR 12/30/2017 1.03   Final   Performed at [facility], [place] [address] [place], [place] [address]  . ABO/RH(D) 12/30/2017 O POS   Final  . Antibody Screen 12/30/2017 NEG   Final  . Sample Expiration 12/30/2017 01/09/2018   Final  . Extend sample reason 12/30/2017    Final                   Value:NO TRANSFUSIONS OR PREGNANCY IN THE PAST 3 MONTHS Performed at [facility], [place] [address] [place], [place] [address]   . Hgb A1c MFr Bld 12/30/2017 6.0* 4.8 - 5.6 % Final   Comment: (NOTE)         Prediabetes: 5.7 - 6.4         Diabetes: >6.4         Glycemic control for adults with diabetes: <7.0   . Mean Plasma Glucose 12/30/2017 126  mg/dL Final   Comment: (NOTE) Performed At: [facility] [place], [place] [id number] Rush Farmer MD HD:[id number]      X-Rays:No results found.  EKG: Orders placed or performed during the hospital encounter of 04/03/17  . EKG 12 lead  . EKG 12 lead     Hospital Course: Julie Fischer is a 57 y.o. who was admitted to [facility]. They were brought to the operating room on 01/06/2018 and underwent Procedure(s): RIGHT TOTAL KNEE ARTHROPLASTY.  Patient tolerated the procedure well and was later transferred to the recovery room and then to  the orthopaedic floor for postoperative care. They were given PO and IV analgesics for pain control following their surgery. They were given 24 hours of postoperative antibiotics of  Anti-infectives (From admission, onward)   Start     Dose/Rate Route Frequency Ordered Stop   01/06/18 2330  vancomycin (VANCOCIN) IVPB 1000 mg/200 mL premix     1,000 mg 200 mL/hr over 60 Minutes Intravenous Every 12 hours 01/06/18 1557 01/07/18 0028   01/06/18 0915  vancomycin  (VANCOCIN) IVPB 1000 mg/200 mL premix     1,000 mg 200 mL/hr over 60 Minutes Intravenous On call to O.R. 01/06/18 6599 01/06/18 1208     and started on DVT prophylaxis in the form of Aspirin.   PT and OT were ordered for total joint protocol. Discharge planning consulted to help with postop disposition and equipment needs. Patient had a decent night on the evening of surgery. They started to get up OOB with therapy on POD #1. Hemovac drain was pulled without difficulty on day one. Continued to work with therapy into POD #2. She was having some issues with the right knee buckling during therapy. Pt was seen during rounds on day two and was ready to go home pending progress with therapy. Blood pressure was improved on day two following NaCl 500 mL bolus given the day prior. Dressing was changed and the incision was clean, dry, and intact with no draiage. Pt worked with therapy for two additional sessions and was meeting their goals, with no further issues with the knee buckling. She was discharged to home later that day in stable condition.  Diet: Diabetic diet Activity: WBAT Follow-up: in 2 weeks with Dr. Wynelle Link Disposition: Home with outpatient physical therapy at [facility] Discharged Condition: stable   Discharge Instructions    Call MD / Call 911   Complete by:  As directed    If you experience chest pain or shortness of breath, CALL 911 and be transported to the hospital emergency room.  If you develope a fever above 101 F, pus (white drainage) or increased drainage or redness at the wound, or calf pain, call your surgeon's office.   Change dressing   Complete by:  As directed    Change the dressing daily with sterile 4 x 4 inch gauze dressing and apply TED hose.   Constipation Prevention   Complete by:  As directed    Drink plenty of fluids.  Prune juice may be helpful.  You may use a stool softener, such as Colace (over the counter) 100 mg twice a day.  Use MiraLax (over the counter)  for constipation as needed.   Diet - low sodium heart healthy   Complete by:  As directed    Discharge instructions   Complete by:  As directed    Dr. Gaynelle Arabian Total Joint Specialist [facility] [address] [place], [place] [address] [phone]  TOTAL KNEE REPLACEMENT POSTOPERATIVE DIRECTIONS  Knee Rehabilitation, Guidelines Following Surgery  Results after knee surgery are often greatly improved when you follow the exercise, range of motion and muscle strengthening exercises prescribed by your doctor. Safety measures are also important to protect the knee from further injury. Any time any of these exercises cause you to have increased pain or swelling in your knee joint, decrease the amount until you are comfortable again and slowly increase them. If you have problems or questions, call your caregiver or physical therapist for advice.   HOME CARE INSTRUCTIONS  Remove items at  home which could result in a fall. This includes throw rugs or furniture in walking pathways.  ICE to the affected knee every three hours for 30 minutes at a time and then as needed for pain and swelling.  Continue to use ice on the knee for pain and swelling from surgery. You may notice swelling that will progress down to the foot and ankle.  This is normal after surgery.  Elevate the leg when you are not up walking on it.   Continue to use the breathing machine which will help keep your temperature down.  It is common for your temperature to cycle up and down following surgery, especially at night when you are not up moving around and exerting yourself.  The breathing machine keeps your lungs expanded and your temperature down. Do not place pillow under knee, focus on keeping the knee straight while resting   DIET You may resume your previous home diet once your are discharged from the hospital.  DRESSING / WOUND CARE / SHOWERING You may shower 3 days after surgery, but keep the wounds dry  during showering.  You may use an occlusive plastic wrap (Press'n Seal for example), NO SOAKING/SUBMERGING IN THE BATHTUB.  If the bandage gets wet, change with a clean dry gauze.  If the incision gets wet, pat the wound dry with a clean towel. You may start showering once you are discharged home but do not submerge the incision under water. Just pat the incision dry and apply a dry gauze dressing on daily. Change the surgical dressing daily and reapply a dry dressing each time.  ACTIVITY Walk with your walker as instructed. Use walker as long as suggested by your caregivers. Avoid periods of inactivity such as sitting longer than an hour when not asleep. This helps prevent blood clots.  You may resume a sexual relationship in one month or when given the OK by your doctor.  You may return to work once you are cleared by your doctor.  Do not drive a car for 6 weeks or until released by you surgeon.  Do not drive while taking narcotics.  WEIGHT BEARING Weight bearing as tolerated with assist device (walker, cane, etc) as directed, use it as long as suggested by your surgeon or therapist, typically at least 4-6 weeks.  POSTOPERATIVE CONSTIPATION PROTOCOL Constipation - defined medically as fewer than three stools per week and severe constipation as less than one stool per week.  One of the most common issues patients have following surgery is constipation.  Even if you have a regular bowel pattern at home, your normal regimen is likely to be disrupted due to multiple reasons following surgery.  Combination of anesthesia, postoperative narcotics, change in appetite and fluid intake all can affect your bowels.  In order to avoid complications following surgery, here are some recommendations in order to help you during your recovery period.  Colace (docusate) - Pick up an over-the-counter form of Colace or another stool softener and take twice a day as long as you are requiring postoperative pain  medications.  Take with a full glass of water daily.  If you experience loose stools or diarrhea, hold the colace until you stool forms back up.  If your symptoms do not get better within 1 week or if they get worse, check with your doctor.  Dulcolax (bisacodyl) - Pick up over-the-counter and take as directed by the product packaging as needed to assist with the movement of your bowels.  Take  with a full glass of water.  Use this product as needed if not relieved by Colace only.   MiraLax (polyethylene glycol) - Pick up over-the-counter to have on hand.  MiraLax is a solution that will increase the amount of water in your bowels to assist with bowel movements.  Take as directed and can mix with a glass of water, juice, soda, coffee, or tea.  Take if you go more than two days without a movement. Do not use MiraLax more than once per day. Call your doctor if you are still constipated or irregular after using this medication for 7 days in a row.  If you continue to have problems with postoperative constipation, please contact the office for further assistance and recommendations.  If you experience "the worst abdominal pain ever" or develop nausea or vomiting, please contact the office immediatly for further recommendations for treatment.  ITCHING  If you experience itching with your medications, try taking only a single pain pill, or even half a pain pill at a time.  You can also use Benadryl over the counter for itching or also to help with sleep.   TED HOSE STOCKINGS Wear the elastic stockings on both legs for three weeks following surgery during the day but you may remove then at night for sleeping.  MEDICATIONS See your medication summary on the "After Visit Summary" that the nursing staff will review with you prior to discharge.  You may have some home medications which will be placed on hold until you complete the course of blood thinner medication.  It is important for you to complete the blood  thinner medication as prescribed by your surgeon.  Continue your approved medications as instructed at time of discharge.  PRECAUTIONS If you experience chest pain or shortness of breath - call 911 immediately for transfer to the hospital emergency department.  If you develop a fever greater that 101 F, purulent drainage from wound, increased redness or drainage from wound, foul odor from the wound/dressing, or calf pain - CONTACT YOUR SURGEON.                                                   FOLLOW-UP APPOINTMENTS Make sure you keep all of your appointments after your operation with your surgeon and caregivers. You should call the office at the above phone number and make an appointment for approximately two weeks after the date of your surgery or on the date instructed by your surgeon outlined in the "After Visit Summary".   RANGE OF MOTION AND STRENGTHENING EXERCISES  Rehabilitation of the knee is important following a knee injury or an operation. After just a few days of immobilization, the muscles of the thigh which control the knee become weakened and shrink (atrophy). Knee exercises are designed to build up the tone and strength of the thigh muscles and to improve knee motion. Often times heat used for twenty to thirty minutes before working out will loosen up your tissues and help with improving the range of motion but do not use heat for the first two weeks following surgery. These exercises can be done on a training (exercise) mat, on the floor, on a table or on a bed. Use what ever works the best and is most comfortable for you Knee exercises include:  Leg Lifts - While your knee is  still immobilized in a splint or cast, you can do straight leg raises. Lift the leg to 60 degrees, hold for 3 sec, and slowly lower the leg. Repeat 10-20 times 2-3 times daily. Perform this exercise against resistance later as your knee gets better.  Quad and Hamstring Sets - Tighten up the muscle on the front of  the thigh (Quad) and hold for 5-10 sec. Repeat this 10-20 times hourly. Hamstring sets are done by pushing the foot backward against an object and holding for 5-10 sec. Repeat as with quad sets.  Leg Slides: Lying on your back, slowly slide your foot toward your buttocks, bending your knee up off the floor (only go as far as is comfortable). Then slowly slide your foot back down until your leg is flat on the floor again. Angel Wings: Lying on your back spread your legs to the side as far apart as you can without causing discomfort.  A rehabilitation program following serious knee injuries can speed recovery and prevent re-injury in the future due to weakened muscles. Contact your doctor or a physical therapist for more information on knee rehabilitation.   IF YOU ARE TRANSFERRED TO A SKILLED REHAB FACILITY If the patient is transferred to a skilled rehab facility following release from the hospital, a list of the current medications will be sent to the facility for the patient to continue.  When discharged from the skilled rehab facility, please have the facility set up the patient's [place] prior to being released. Also, the skilled facility will be responsible for providing the patient with their medications at time of release from the facility to include their pain medication, the muscle relaxants, and their blood thinner medication. If the patient is still at the rehab facility at time of the two week follow up appointment, the skilled rehab facility will also need to assist the patient in arranging follow up appointment in our office and any transportation needs.  MAKE SURE YOU:  Understand these instructions.  Get help right away if you are not doing well or get worse.    Pick up stool softner and laxative for home use following surgery while on pain medications. Do not submerge incision under water. Please use good hand washing techniques while changing dressing each  day. May shower starting three days after surgery. Please use a clean towel to pat the incision dry following showers. Continue to use ice for pain and swelling after surgery. Do not use any lotions or creams on the incision until instructed by your surgeon.   Do not put a pillow under the knee. Place it under the heel.   Complete by:  As directed    Driving restrictions   Complete by:  As directed    No driving for two weeks   TED hose   Complete by:  As directed    Use stockings (TED hose) for three weeks on both leg(s).  You may remove them at night for sleeping.   Weight bearing as tolerated   Complete by:  As directed      Allergies as of 01/08/2018      Reactions   Heparin Other (See Comments)   Low platelets   Niacin And Related Other (See Comments)   Blotchy/itchy   Tessalon [benzonatate] Diarrhea   Penicillins Rash, Other (See Comments)   Has patient had a PCN reaction causing immediate rash, facial/tongue/throat swelling, SOB or lightheadedness with hypotension: Unknown Has patient had a PCN reaction causing  severe rash involving mucus membranes or skin necrosis: Unknown Has patient had a PCN reaction that required hospitalization: No Has patient had a PCN reaction occurring within the last 10 years: No If all of the above answers are "NO", then may proceed with Cephalosporin use.      Medication List    STOP taking these medications   naproxen sodium 220 MG tablet Commonly known as:  ALEVE     TAKE these medications   acetaminophen 500 MG tablet Commonly known as:  TYLENOL Take 1,500 mg every 6 (six) hours as needed by mouth for moderate pain or headache.   aspirin 325 MG EC tablet Take 1 tablet (325 mg total) by mouth 2 (two) times daily for 19 days. Take one tablet (325 mg) Aspirin two times a day for three weeks following surgery. Then take one baby Aspirin (81 mg) once a day for three weeks. Then discontinue aspirin.   BEANO PO Take 1 tablet by mouth  daily.   CALCIUM 600 + D PO Take 1 tablet by mouth 2 (two) times daily.   cetirizine 10 MG tablet Commonly known as:  ZYRTEC Take 10 mg by mouth daily.   Dulaglutide 0.75 MG/0.5ML Sopn Inject 0.75 mg into the skin every 7 (seven) days. What changed:  additional instructions   gabapentin 300 MG capsule Commonly known as:  NEURONTIN Take 1 capsule (300 mg total) by mouth 3 (three) times daily. Gabapentin 300 mg Protocol Take a 300 mg capsule three times a day for two weeks following surgery. Then take a 300 mg capsule two times a day for two weeks.  Then take a 300 mg capsule once a day for two weeks.  Then discontinue the Gabapentin.   HAIR SKIN AND NAILS FORMULA PO Take 1 tablet by mouth daily.   lisinopril 10 MG tablet Commonly known as:  PRINIVIL,ZESTRIL Take 1 tablet (10 mg total) by mouth daily. What changed:  when to take this   LYSINE PO Take 1 tablet by mouth daily as needed (cold sore).   metFORMIN 500 MG 24 hr tablet Commonly known as:  GLUCOPHAGE-XR TAKE 2 TABLETS DAILY What changed:    how much to take  how to take this  when to take this   methocarbamol 500 MG tablet Commonly known as:  ROBAXIN Take 1 tablet (500 mg total) by mouth every 6 (six) hours as needed for muscle spasms. What changed:  when to take this   multivitamin with minerals Tabs tablet Take 1 tablet by mouth 2 (two) times daily.   Omega-3 1000 MG Caps Take 1,000 mg by mouth 2 (two) times daily.   omeprazole 20 MG tablet Commonly known as:  PRILOSEC OTC Take 1 capsule up to twice daily for the next 1-2 weeks What changed:    how much to take  how to take this  when to take this  reasons to take this  additional instructions   oxyCODONE 5 MG immediate release tablet Commonly known as:  Oxy IR/ROXICODONE Take 1-2 tablets (5-10 mg total) by mouth every 6 (six) hours as needed for moderate pain (pain score 4-6).   PARoxetine 40 MG tablet Commonly known as:  PAXIL Take  1 tablet (40 mg total) by mouth every morning. What changed:  when to take this   RESTASIS 0.05 % ophthalmic emulsion Generic drug:  cycloSPORINE INSTILL 1 DROP IN EACH EYE 2 TIMES DAILY   simethicone 125 MG chewable tablet Commonly known as:  Berneta Sages  125 mg every 6 (six) hours as needed by mouth for flatulence.   simvastatin 20 MG tablet Commonly known as:  ZOCOR Take 1 tablet (20 mg total) by mouth at bedtime.   traMADol 50 MG tablet Commonly known as:  ULTRAM Take 1-2 tablets (50-100 mg total) by mouth every 6 (six) hours as needed for moderate pain.   valACYclovir 1000 MG tablet Commonly known as:  VALTREX TAKE 2 TABLETS AT ONSET OF COLD SORE AND REPEAT IN 12 HOURS (4 TABLETS PER EPISODE OF COLD SORE)   zolpidem 12.5 MG CR tablet Commonly known as:  AMBIEN CR Take 1 tablet (12.5 mg total) by mouth at bedtime.            Discharge Care Instructions  (From admission, onward)         Start     Ordered   01/08/18 0000  Weight bearing as tolerated     01/08/18 0805   01/08/18 0000  Change dressing    Comments:  Change the dressing daily with sterile 4 x 4 inch gauze dressing and apply TED hose.   01/08/18 0805         Follow-up Information    Gaynelle Arabian, MD. Schedule an appointment as soon as possible for a visit on 01/21/2018.   Specialty:  Orthopedic Surgery Contact information: [address] [place] [address] [phone]           Signed: Theresa Duty, PA-C Orthopedic Surgery 01/09/2018, 1:40 PM

## 2018-01-10 DIAGNOSIS — M25661 Stiffness of right knee, not elsewhere classified: Secondary | ICD-10-CM | POA: Insufficient documentation

## 2018-01-10 LAB — GLUCOSE, CAPILLARY
GLUCOSE-CAPILLARY: 93 mg/dL (ref 70–99)
Glucose-Capillary: 119 mg/dL — ABNORMAL HIGH (ref 70–99)

## 2018-01-13 DIAGNOSIS — M25661 Stiffness of right knee, not elsewhere classified: Secondary | ICD-10-CM | POA: Diagnosis not present

## 2018-01-15 DIAGNOSIS — M25661 Stiffness of right knee, not elsewhere classified: Secondary | ICD-10-CM | POA: Diagnosis not present

## 2018-01-21 ENCOUNTER — Other Ambulatory Visit: Payer: Self-pay | Admitting: Family Medicine

## 2018-01-21 DIAGNOSIS — G4700 Insomnia, unspecified: Secondary | ICD-10-CM

## 2018-01-21 MED ORDER — ZOLPIDEM TARTRATE ER 12.5 MG PO TBCR: 12.5000 mg | EXTENDED_RELEASE_TABLET | Freq: Every day | ORAL | 0 refills | Status: DC

## 2018-01-21 NOTE — Telephone Encounter (Signed)
Is this okay to refill? 

## 2018-01-24 DIAGNOSIS — G4733 Obstructive sleep apnea (adult) (pediatric): Secondary | ICD-10-CM | POA: Diagnosis not present

## 2018-01-27 DIAGNOSIS — M25661 Stiffness of right knee, not elsewhere classified: Secondary | ICD-10-CM | POA: Diagnosis not present

## 2018-01-29 DIAGNOSIS — M25661 Stiffness of right knee, not elsewhere classified: Secondary | ICD-10-CM | POA: Diagnosis not present

## 2018-01-31 DIAGNOSIS — M25661 Stiffness of right knee, not elsewhere classified: Secondary | ICD-10-CM | POA: Diagnosis not present

## 2018-02-03 DIAGNOSIS — M25661 Stiffness of right knee, not elsewhere classified: Secondary | ICD-10-CM | POA: Diagnosis not present

## 2018-02-04 DIAGNOSIS — M1711 Unilateral primary osteoarthritis, right knee: Secondary | ICD-10-CM | POA: Diagnosis not present

## 2018-02-05 DIAGNOSIS — M25661 Stiffness of right knee, not elsewhere classified: Secondary | ICD-10-CM | POA: Diagnosis not present

## 2018-02-07 DIAGNOSIS — M25661 Stiffness of right knee, not elsewhere classified: Secondary | ICD-10-CM | POA: Diagnosis not present

## 2018-02-10 ENCOUNTER — Encounter: Payer: 59 | Admitting: Family Medicine

## 2018-02-10 ENCOUNTER — Other Ambulatory Visit (INDEPENDENT_AMBULATORY_CARE_PROVIDER_SITE_OTHER): Payer: 59

## 2018-02-10 DIAGNOSIS — Z23 Encounter for immunization: Secondary | ICD-10-CM

## 2018-02-10 DIAGNOSIS — M810 Age-related osteoporosis without current pathological fracture: Secondary | ICD-10-CM | POA: Diagnosis not present

## 2018-02-10 MED ORDER — DENOSUMAB 60 MG/ML ~~LOC~~ SOSY
60.0000 mg | PREFILLED_SYRINGE | Freq: Once | SUBCUTANEOUS | Status: AC
  Administered 2018-02-10: 60 mg via SUBCUTANEOUS

## 2018-02-11 DIAGNOSIS — M25661 Stiffness of right knee, not elsewhere classified: Secondary | ICD-10-CM | POA: Diagnosis not present

## 2018-02-12 DIAGNOSIS — M25661 Stiffness of right knee, not elsewhere classified: Secondary | ICD-10-CM | POA: Diagnosis not present

## 2018-02-14 DIAGNOSIS — M25661 Stiffness of right knee, not elsewhere classified: Secondary | ICD-10-CM | POA: Diagnosis not present

## 2018-02-20 DIAGNOSIS — M25661 Stiffness of right knee, not elsewhere classified: Secondary | ICD-10-CM | POA: Diagnosis not present

## 2018-02-24 DIAGNOSIS — M25661 Stiffness of right knee, not elsewhere classified: Secondary | ICD-10-CM | POA: Diagnosis not present

## 2018-02-26 DIAGNOSIS — M25661 Stiffness of right knee, not elsewhere classified: Secondary | ICD-10-CM | POA: Diagnosis not present

## 2018-03-04 DIAGNOSIS — M25661 Stiffness of right knee, not elsewhere classified: Secondary | ICD-10-CM | POA: Diagnosis not present

## 2018-03-06 ENCOUNTER — Other Ambulatory Visit: Payer: Self-pay | Admitting: Family Medicine

## 2018-03-06 DIAGNOSIS — M25661 Stiffness of right knee, not elsewhere classified: Secondary | ICD-10-CM | POA: Diagnosis not present

## 2018-03-06 DIAGNOSIS — E119 Type 2 diabetes mellitus without complications: Secondary | ICD-10-CM

## 2018-03-11 DIAGNOSIS — M25661 Stiffness of right knee, not elsewhere classified: Secondary | ICD-10-CM | POA: Diagnosis not present

## 2018-03-13 DIAGNOSIS — M25661 Stiffness of right knee, not elsewhere classified: Secondary | ICD-10-CM | POA: Diagnosis not present

## 2018-03-21 ENCOUNTER — Other Ambulatory Visit: Payer: 59

## 2018-03-21 DIAGNOSIS — Z0000 Encounter for general adult medical examination without abnormal findings: Secondary | ICD-10-CM

## 2018-03-21 DIAGNOSIS — E785 Hyperlipidemia, unspecified: Secondary | ICD-10-CM | POA: Diagnosis not present

## 2018-03-21 DIAGNOSIS — Z5181 Encounter for therapeutic drug level monitoring: Secondary | ICD-10-CM

## 2018-03-21 DIAGNOSIS — E119 Type 2 diabetes mellitus without complications: Secondary | ICD-10-CM | POA: Diagnosis not present

## 2018-03-22 ENCOUNTER — Other Ambulatory Visit: Payer: Self-pay | Admitting: Family Medicine

## 2018-03-22 DIAGNOSIS — I10 Essential (primary) hypertension: Secondary | ICD-10-CM

## 2018-03-22 LAB — HEMOGLOBIN A1C
Est. average glucose Bld gHb Est-mCnc: 123 mg/dL
HEMOGLOBIN A1C: 5.9 % — AB (ref 4.8–5.6)

## 2018-03-22 LAB — COMPREHENSIVE METABOLIC PANEL
A/G RATIO: 2.2 (ref 1.2–2.2)
ALT: 25 IU/L (ref 0–32)
AST: 21 IU/L (ref 0–40)
Albumin: 4.6 g/dL (ref 3.5–5.5)
Alkaline Phosphatase: 49 IU/L (ref 39–117)
BUN/Creatinine Ratio: 27 — ABNORMAL HIGH (ref 9–23)
BUN: 16 mg/dL (ref 6–24)
CHLORIDE: 101 mmol/L (ref 96–106)
CO2: 25 mmol/L (ref 20–29)
Calcium: 10.2 mg/dL (ref 8.7–10.2)
Creatinine, Ser: 0.6 mg/dL (ref 0.57–1.00)
GFR calc Af Amer: 115 mL/min/{1.73_m2} (ref >59)
GFR calc non Af Amer: 100 mL/min/{1.73_m2} (ref >59)
Globulin, Total: 2.1 g/dL (ref 1.5–4.5)
Glucose: 105 mg/dL — ABNORMAL HIGH (ref 65–99)
POTASSIUM: 5 mmol/L (ref 3.5–5.2)
Sodium: 142 mmol/L (ref 134–144)
Total Protein: 6.7 g/dL (ref 6.0–8.5)

## 2018-03-22 LAB — CBC WITH DIFFERENTIAL/PLATELET
BASOS ABS: 0.1 10*3/uL (ref 0.0–0.2)
Basos: 1 %
EOS (ABSOLUTE): 0.2 10*3/uL (ref 0.0–0.4)
Eos: 3 %
Hematocrit: 34.1 % (ref 34.0–46.6)
Hemoglobin: 10.9 g/dL — ABNORMAL LOW (ref 11.1–15.9)
IMMATURE GRANS (ABS): 0 10*3/uL (ref 0.0–0.1)
IMMATURE GRANULOCYTES: 0 %
Lymphocytes Absolute: 3 10*3/uL (ref 0.7–3.1)
Lymphs: 43 %
MCH: 26.5 pg — AB (ref 26.6–33.0)
MCHC: 32 g/dL (ref 31.5–35.7)
MCV: 83 fL (ref 79–97)
Monocytes Absolute: 0.6 10*3/uL (ref 0.1–0.9)
Monocytes: 9 %
NEUTROS ABS: 3.1 10*3/uL (ref 1.4–7.0)
NEUTROS PCT: 44 %
PLATELETS: 318 10*3/uL (ref 150–450)
RBC: 4.11 x10E6/uL (ref 3.77–5.28)
RDW: 13.8 % (ref 12.3–15.4)
WBC: 7 10*3/uL (ref 3.4–10.8)

## 2018-03-22 LAB — TSH: TSH: 0.902 u[IU]/mL (ref 0.450–4.500)

## 2018-03-22 LAB — MICROALBUMIN / CREATININE URINE RATIO
CREATININE, UR: 133.1 mg/dL
MICROALBUM., U, RANDOM: 17.2 ug/mL
Microalb/Creat Ratio: 12.9 mg/g creat (ref 0.0–30.0)

## 2018-03-22 LAB — LIPID PANEL
CHOLESTEROL TOTAL: 146 mg/dL (ref 100–199)
Chol/HDL Ratio: 3.1 ratio (ref 0.0–4.4)
HDL: 47 mg/dL (ref >39)
LDL Calculated: 71 mg/dL (ref 0–99)
Triglycerides: 138 mg/dL (ref 0–149)
VLDL CHOLESTEROL CAL: 28 mg/dL (ref 5–40)

## 2018-03-25 NOTE — Progress Notes (Signed)
Chief Complaint  Patient presents with  . Annual Exam    nonfasting annual exam, labs already done with pelvic. Has eyes checked with [facility]. No new complaints.     Julie Fischer is a 57 y.o. female who presents for a complete physical.  She has the following concerns:  She underwent R total knee replacement in August 2019. Completed rehab last week.  Has some mild pain, controlled by Tylenol.   Diabetes.  She was changed from [place] to [place] in March (had yeast infection, and sugars remained elevated). She was started on the lower dose, and kept at that dose at her f/u visit in May.  A1c had improved from 8.3 in February, to 6.4 in May. She continues to tolerate this without side effects. She continues on 1028m of metformin daily. Sugars have been running in the low 100's (103-104), but admits that she doesn't check it very often). Denies hypoglycemia (very rare), polydipsia, polyuria.  Checks feet regularly and has no concerns/lesions/pain.  Last eye exam was 06/2017, no retinopathy  OSA--doing well on CPAP. Compliant with daily use. Feels refreshed in the mornings.  Hypertension follow-up: Hasn't been checking blood pressures at home recently. Checks occasionally, recalls 123/74 as the highest.  Denies dizziness, headaches, chest pain. Denies side effects of medications, no cough.   Depression: Moods seem to be well controlled on the current dose of paroxetine. Moodsimprovedquitting her job. A little worse over the past year related to knee pain and surgery.  Overall moods are good on current dose. Denies SI/HI. Denies side effects of medications.    Hyperlipidemia follow-up: Patient is reportedly following a low-fat, low cholesterol diet. Compliant with medications (simvastatin and fish oil) and denies medication side effects. She had labs prior to her visit, see below.  Osteoporosis: She is tolerating Prolia without side effects. Last DEXA was 05/2017, T-2.7 at spine.  Changed from fosamax to Prolia after that study. Last injection was 02/10/18.  Insomnia: requires Ambien CR nightly. No unusual behavior.The Ambien is effective in treating the insomnia--she doesn't sleep at all if she doesn't take it, "the brain won't shut up". Occasionally still asks her husband for food; no longer texting anybody (and phone is now kept upstairs with her at night).  H/o breast cancer: She is no longer under the care of oncologist (previously saw Dr. RTruddie Coco) Last mammogram was 05/2017. She checks her breasts regularly. She has some intermittent tenderness around her scar (chronic, unchanged).   Needs refill on her Valtrex, having some problems with cold sores recently   Immunization History  Administered Date(s) Administered  . Influenza Split 02/07/2009, 04/10/2010, 04/19/2011, 01/28/2012  . Influenza,inj,Quad PF,6+ Mos 01/28/2013, 01/28/2014, 02/03/2015, 02/23/2016, 03/13/2017, 02/10/2018  . Pneumococcal Polysaccharide-23 06/04/2001, 11/15/2010, 03/15/2016  . Td 06/04/2001  . Tdap 11/15/2010   Last Pap smear: s/p hysterectomy  Last mammogram: 05/2017 Last colonoscopy: 06/2017, normal; recommended 5 year f/u due to FHx Last DEXA: 05/2017, T-2.7 at spine, statistically significant decrease from prior exam at L hip (T-2.1) Ophtho: 05/2017 Dentist: twice yearly  Exercise: PT finished last week.   Past Medical History:  Diagnosis Date  . Allergy    fall seasonal  . Anxiety   . Blood transfusion    Platelet transfusion when on heparin  . [place]   R breast(lumpectomy,chemo,radiation,tamoxifen)  . C. difficile diarrhea 02/2016   treated with Flagyl  . Complication of anesthesia    gets FJillyn Ledgergoing under she has been told  . Depression  treated  . Diabetes mellitus    type 2  . GERD (gastroesophageal reflux disease)   . Hyperlipidemia   . Hypertension    resolved after weight loss surgery; recurred 2013  . Insomnia chronic  . Iron  deficiency    h/o  . Microalbuminuria    h/o  . OA (osteoarthritis) of knee   . Osteoporosis   . Personal history of chemotherapy   . Personal history of radiation therapy   . Plantar fasciitis (07') DrRegal   s/p surgical release 01/2011  . Sleep apnea    resolved after weight loss surgery; recurrent with weight gain; on CPAP  . Vitamin D deficiency     Past Surgical History:  Procedure Laterality Date  . ABDOMINAL HYSTERECTOMY  2000   and RSO(endometriosis)  . APPENDECTOMY    . BREAST LUMPECTOMY Right  11/1999  . COLONOSCOPY  2002, 2012  . cuboid stress fracture Right 12/2002  . FOOT SURGERY Left 2008  . GASTRIC ROUX-EN-Y  (DrNewman) 3/04  . left shoulder fracture repair  (DrMortensen) 12/05  . MYRINGOTOMY     tubes B/L, multiple sets  . plantar fasciitis release Right 01/30/11   Dr. Paulla Dolly  . TONSILLECTOMY AND ADENOIDECTOMY    . TOTAL KNEE ARTHROPLASTY Left 04/08/2017   Procedure: LEFT TOTAL KNEE ARTHROPLASTY;  Surgeon: Gaynelle Arabian, MD;  Location: [facility];  Service: Orthopedics;  Laterality: Left;  . TOTAL KNEE ARTHROPLASTY Right 01/06/2018   Procedure: RIGHT TOTAL KNEE ARTHROPLASTY;  Surgeon: Gaynelle Arabian, MD;  Location: [facility];  Service: Orthopedics;  Laterality: Right;    Social History   Socioeconomic History  . Marital status: Married    Spouse name: Not on file  . Number of children: 0  . Years of education: Not on file  . Highest education level: Not on file  Occupational History  . Occupation: retired  Scientific laboratory technician  . Financial resource strain: Not on file  . Food insecurity:    Worry: Not on file    Inability: Not on file  . Transportation needs:    Medical: Not on file    Non-medical: Not on file  Tobacco Use  . Smoking status: Former Smoker    Last attempt to quit: 01/28/1996    Years since quitting: 22.1  . Smokeless tobacco: Never Used  Substance and Sexual Activity  . Alcohol use: Yes    Comment: 1/2 beer every 1-2 months  . Drug use: No  .  Sexual activity: Yes    Partners: Male  Lifestyle  . Physical activity:    Days per week: Not on file    Minutes per session: Not on file  . Stress: Not on file  Relationships  . Social connections:    Talks on phone: Not on file    Gets together: Not on file    Attends religious service: Not on file    Active member of club or organization: Not on file    Attends meetings of clubs or organizations: Not on file    Relationship status: Not on file  . Intimate partner violence:    Fear of current or ex partner: Not on file    Emotionally abused: Not on file    Physically abused: Not on file    Forced sexual activity: Not on file  Other Topics Concern  . Not on file  Social History Narrative   Married. Lives with husband, 1 dog (boxer-lab mix).   Not currently working  Family History  Problem Relation Age of Onset  . Dementia Mother   . Stroke Mother 33       due to aneurysm  . Macular degeneration Mother   . Colon cancer Mother 21  . Lung cancer Father   . Hyperlipidemia Sister   . Hyperlipidemia Brother   . Diabetes Brother   . Hypertension Brother   . Hepatitis C Brother   . Alcohol abuse Brother   . Drug abuse Brother   . Diabetes Maternal Grandmother   . Hyperlipidemia Sister   . Breast cancer Cousin   . Breast cancer Cousin     Outpatient Encounter Medications as of 03/26/2018  Medication Sig Note  . Alpha-D-Galactosidase (BEANO PO) Take 1 tablet by mouth daily.   . Calcium Carb-Cholecalciferol (CALCIUM 600 + D PO) Take 1 tablet by mouth 2 (two) times daily.   . cetirizine (ZYRTEC) 10 MG tablet Take 10 mg by mouth daily.    . Dulaglutide (TRULICITY) 4.81 EH/6.3JS SOPN Inject 0.75 mg into the skin every 7 (seven) days. (Patient taking differently: Inject 0.75 mg into the skin every 7 (seven) days. On Fridays)   . lisinopril (PRINIVIL,ZESTRIL) 10 MG tablet TAKE 1 TABLET DAILY   . LYSINE PO Take 1 tablet by mouth daily as needed (cold sore).   . metFORMIN  (GLUCOPHAGE-XR) 500 MG 24 hr tablet TAKE 2 TABLETS DAILY   . Multiple Vitamin (MULTIVITAMIN WITH MINERALS) TABS tablet Take 1 tablet by mouth 2 (two) times daily.   . Multiple Vitamins-Minerals (HAIR SKIN AND NAILS FORMULA PO) Take 1 tablet by mouth daily.   . Omega-3 1000 MG CAPS Take 1,000 mg by mouth 2 (two) times daily.   Marland Kitchen omeprazole (PRILOSEC OTC) 20 MG tablet Take 1 capsule up to twice daily for the next 1-2 weeks (Patient taking differently: Take 20 mg daily as needed by mouth (for acid reflux or heartburn). ) 03/26/2018: Using prn heartburn  . PARoxetine (PAXIL) 40 MG tablet Take 1 tablet (40 mg total) by mouth every morning. (Patient taking differently: Take 40 mg by mouth at bedtime. )   . RESTASIS 0.05 % ophthalmic emulsion INSTILL 1 DROP IN EACH EYE 2 TIMES DAILY   . simethicone (MYLICON) 970 MG chewable tablet Chew 125 mg every 6 (six) hours as needed by mouth for flatulence.   . simvastatin (ZOCOR) 20 MG tablet Take 1 tablet (20 mg total) by mouth at bedtime.   Marland Kitchen zolpidem (AMBIEN CR) 12.5 MG CR tablet Take 1 tablet (12.5 mg total) by mouth at bedtime.   Marland Kitchen acetaminophen (TYLENOL) 500 MG tablet Take 1,500 mg every 6 (six) hours as needed by mouth for moderate pain or headache.    . methocarbamol (ROBAXIN) 500 MG tablet Take 1 tablet (500 mg total) by mouth every 6 (six) hours as needed for muscle spasms. (Patient not taking: Reported on 03/26/2018)   . traMADol (ULTRAM) 50 MG tablet Take 1-2 tablets (50-100 mg total) by mouth every 6 (six) hours as needed for moderate pain. (Patient not taking: Reported on 03/26/2018)   . valACYclovir (VALTREX) 1000 MG tablet TAKE 2 TABLETS AT ONSET OF COLD SORE AND REPEAT IN 12 HOURS (4 TABLETS PER EPISODE OF COLD SORE) (Patient not taking: Reported on 12/23/2017)   . [DISCONTINUED] gabapentin (NEURONTIN) 300 MG capsule Take 1 capsule (300 mg total) by mouth 3 (three) times daily. Gabapentin 300 mg Protocol Take a 300 mg capsule three times a day for two  weeks following surgery. Then  take a 300 mg capsule two times a day for two weeks.  Then take a 300 mg capsule once a day for two weeks.  Then discontinue the Gabapentin.   . [DISCONTINUED] oxyCODONE (OXY IR/ROXICODONE) 5 MG immediate release tablet Take 1-2 tablets (5-10 mg total) by mouth every 6 (six) hours as needed for moderate pain (pain score 4-6).    Facility-Administered Encounter Medications as of 03/26/2018  Medication  . 0.9 %  sodium chloride infusion    Allergies  Allergen Reactions  . Heparin Other (See Comments)    Low platelets  . Niacin And Related Other (See Comments)    Blotchy/itchy  . Tessalon [Benzonatate] Diarrhea  . Penicillins Rash and Other (See Comments)    Has patient had a PCN reaction causing immediate rash, facial/tongue/throat swelling, SOB or lightheadedness with hypotension: Unknown Has patient had a PCN reaction causing severe rash involving mucus membranes or skin necrosis: Unknown Has patient had a PCN reaction that required hospitalization: No Has patient had a PCN reaction occurring within the last 10 years: No If all of the above answers are "NO", then may proceed with Cephalosporin use.     ROS: The patient denies anorexia,fever, headaches, vision changes, decreased hearing, ear pain, sore throat, chest pain, palpitations, dizziness, syncope, dyspnea on exertion, cough, swelling, nausea, vomiting, constipation, abdominal pain, melena, hematochezia, hematuria, incontinence, dysuria, vaginal bleeding, discharge, odor or itch, genital lesions, numbness, tingling, weakness, tremor, suspicious skin lesions, abnormal bleeding/bruising, or enlarged lymph nodes.  Chronic insomnia. Moods well controlled as per HPI. Fall allergies, only mild currently, sinuses rinses are helping. Some loose stools (?from metformin), less often than in the past. Some memory issues--losing things (glasses, keys, remote controls); denies change in the last year. Recent  right knee surgery, some residual discomfort on both (overall much better). Has been having some heartburn at night after taking tylenol, uses Tums or Prilosec as needed.  About 3x/week.   PHYSICAL EXAM:  BP 120/70   Pulse 84   Ht 4' 9"  (1.448 m)   Wt 145 lb 9.6 oz (66 kg)   BMI 31.51 kg/m   Wt Readings from Last 3 Encounters:  03/26/18 145 lb 9.6 oz (66 kg)  01/06/18 138 lb (62.6 kg)  12/30/17 147 lb (66.7 kg)   Wt 153# 6.4oz at her CPE last year   General Appearance:  Alert, cooperative, no distress, appears stated age   Head:  Normocephalic, without obvious abnormality, atraumatic   Eyes:  PERRL, conjunctiva/corneas clear, EOM's intact, fundi benign   Ears:  Normal TM's and external ear canals. Extruded blue PE tube notedin the right canal.  Nose:  Nasal mucosa mildly edematous with some clear mucus. Sinuses nontender   Throat:  Lips, mucosa, and tongue normal; teeth and gums normal   Neck:  Supple, no lymphadenopathy; thyroid: no enlargement/tenderness/nodules; no carotid bruit or JVD   Back:  Spine nontender, no curvature, ROM normal. 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 No focal mass. No nipple discharge or inversion. L breast exam is entirely normal. No axillary lymphadenopathy.  Abdomen:  Soft, nondistended, normoactive bowel sounds, no masses, no hepatosplenomegaly   Genitalia:  Normal external genitalia  without lesions. BUS and vagina normal; Bimanual exam revealed surgically absent uterus. No adnexal masses. No mass, rebound tenderness or guarding   Rectal:  Normal tone, no masses or tenderness; guaiac negative stool    Extremities:  No clubbing, cyanosis or edema. No lesions. Normal diabetic foot exam. WHSS right knee. No effusion or warmth.  Pulses:  2+ and symmetric all extremities   Skin:  Skin color, texture, turgor normal, no rashes or lesions   Lymph nodes:  Cervical, supraclavicular, and axillary nodes normal   Neurologic:  CNII-XII intact, normal strength, sensation and gait; reflexes 2+ and symmetric throughout      Psych:  Normal mood, affect, hygiene and grooming.     Lab Results  Component Value Date   HGBA1C 5.9 (H) 03/21/2018     Chemistry      Component Value Date/Time   NA 142 03/21/2018 0916   K 5.0 03/21/2018 0916   CL 101 03/21/2018 0916   CO2 25 03/21/2018 0916   BUN 16 03/21/2018 0916   CREATININE 0.60 03/21/2018 0916   CREATININE 0.55 03/18/2017 1006      Component Value Date/Time   CALCIUM 10.2 03/21/2018 0916   ALKPHOS 49 03/21/2018 0916   AST 21 03/21/2018 0916   ALT 25 03/21/2018 0916   BILITOT <0.2 03/21/2018 0916     Fasting glucose 105  Lab Results  Component Value Date   CHOL 146 03/21/2018   HDL 47 03/21/2018   LDLCALC 71 03/21/2018   TRIG 138 03/21/2018   CHOLHDL 3.1 03/21/2018   Lab Results  Component Value Date   TSH 0.902 03/21/2018   Lab Results  Component Value Date   WBC 7.0 03/21/2018   HGB 10.9 (L) 03/21/2018   HCT 34.1 03/21/2018   MCV 83 03/21/2018   PLT 318 03/21/2018   Urine microalbumin/Cr ratio 12.9 (normal)    ASSESSMENT/PLAN:  Routine general medical examination at a health care facility  Type 2 diabetes mellitus without complication, without long-term current use of insulin (HCC) - controlled with current regimen, continue - Plan: Hemoglobin A1c, Comprehensive metabolic panel, Dulaglutide (TRULICITY) 2.35 TI/1.4ER SOPN  Depression, major, in remission ([place]) - continue Paxil, doing well. - Plan: PARoxetine (PAXIL) 40 MG tablet  Osteoporosis, unspecified osteoporosis type, unspecified pathological  fracture presence - continue Prolia, calcium, D; counseled re: weight-bearing exercise  Insomnia, unspecified type - Plan: zolpidem (AMBIEN CR) 12.5 MG CR tablet  Pure hypercholesterolemia - at goal on simvastatin - Plan: simvastatin (ZOCOR) 20 MG tablet  Essential hypertension, benign - well controlled  OSA on CPAP - continue use of CPAP. Further weight loss encouraged  Medication monitoring encounter - Plan: Hemoglobin A1c, Comprehensive metabolic panel  Herpes labialis - Plan: valACYclovir (VALTREX) 1000 MG tablet  Insomnia, unspecified type - chronic; well controlled with ambien CR, without side effects - Plan: zolpidem (AMBIEN CR) 12.5 MG CR tablet  Class 1 obesity due to excess calories with serious comorbidity and body mass index (BMI) of 31.0 to 31.9 in adult - counseled re: further weight loss   F/u 6 months with labs prior (c-met and A1c)  Discussed monthly self breast exams and yearly mammograms; at least 30 minutes of aerobic activity at least 5 days/week, weight-bearing exercise at least 2x/wk; proper sunscreen use reviewed; healthy diet, including goals of calcium and vitamin D intake and alcohol recommendations (less than or equal to 1 drink/day) reviewed; regular seatbelt use; changing batteries in smoke detectors. Immunization recommendations discussed--UTD; Shingrix recommended. Colonoscopy recommendations reviewed,  UTD.  Due again 06/2022 DEXA will be due 05/2019

## 2018-03-26 ENCOUNTER — Encounter: Payer: Self-pay | Admitting: Family Medicine

## 2018-03-26 ENCOUNTER — Ambulatory Visit (INDEPENDENT_AMBULATORY_CARE_PROVIDER_SITE_OTHER): Payer: 59 | Admitting: Family Medicine

## 2018-03-26 VITALS — BP 120/70 | HR 84 | Ht <= 58 in | Wt 145.6 lb

## 2018-03-26 DIAGNOSIS — M810 Age-related osteoporosis without current pathological fracture: Secondary | ICD-10-CM

## 2018-03-26 DIAGNOSIS — E119 Type 2 diabetes mellitus without complications: Secondary | ICD-10-CM | POA: Diagnosis not present

## 2018-03-26 DIAGNOSIS — Z6831 Body mass index (BMI) 31.0-31.9, adult: Secondary | ICD-10-CM

## 2018-03-26 DIAGNOSIS — Z5181 Encounter for therapeutic drug level monitoring: Secondary | ICD-10-CM

## 2018-03-26 DIAGNOSIS — Z9989 Dependence on other enabling machines and devices: Secondary | ICD-10-CM

## 2018-03-26 DIAGNOSIS — G4700 Insomnia, unspecified: Secondary | ICD-10-CM | POA: Diagnosis not present

## 2018-03-26 DIAGNOSIS — I10 Essential (primary) hypertension: Secondary | ICD-10-CM

## 2018-03-26 DIAGNOSIS — F325 Major depressive disorder, single episode, in full remission: Secondary | ICD-10-CM | POA: Diagnosis not present

## 2018-03-26 DIAGNOSIS — B001 Herpesviral vesicular dermatitis: Secondary | ICD-10-CM

## 2018-03-26 DIAGNOSIS — Z0000 Encounter for general adult medical examination without abnormal findings: Secondary | ICD-10-CM | POA: Diagnosis not present

## 2018-03-26 DIAGNOSIS — E7800 Pure hypercholesterolemia, unspecified: Secondary | ICD-10-CM

## 2018-03-26 DIAGNOSIS — E6609 Other obesity due to excess calories: Secondary | ICD-10-CM

## 2018-03-26 DIAGNOSIS — G4733 Obstructive sleep apnea (adult) (pediatric): Secondary | ICD-10-CM

## 2018-03-26 MED ORDER — PAROXETINE HCL 40 MG PO TABS: 40.0000 mg | ORAL_TABLET | Freq: Every day | ORAL | 3 refills | Status: DC

## 2018-03-26 MED ORDER — ZOLPIDEM TARTRATE ER 12.5 MG PO TBCR: 12.5000 mg | EXTENDED_RELEASE_TABLET | Freq: Every day | ORAL | 0 refills | Status: DC

## 2018-03-26 MED ORDER — DULAGLUTIDE 0.75 MG/0.5ML ~~LOC~~ SOAJ: 0.7500 mg | SUBCUTANEOUS | 1 refills | Status: DC

## 2018-03-26 MED ORDER — SIMVASTATIN 20 MG PO TABS: 20.0000 mg | ORAL_TABLET | Freq: Every day | ORAL | 1 refills | Status: DC

## 2018-03-26 MED ORDER — VALACYCLOVIR HCL 1 G PO TABS: ORAL_TABLET | ORAL | 1 refills | Status: DC

## 2018-03-26 NOTE — Patient Instructions (Signed)
  HEALTH MAINTENANCE RECOMMENDATIONS:  It is recommended that you get at least 30 minutes of aerobic exercise at least 5 days/week (for weight loss, you may need as much as 60-90 minutes). This can be any activity that gets your heart rate up. This can be divided in 10-15 minute intervals if needed, but try and build up your endurance at least once a week.  Weight bearing exercise is also recommended twice weekly.  Eat a healthy diet with lots of vegetables, fruits and fiber.  "Colorful" foods have a lot of vitamins (ie green vegetables, tomatoes, red peppers, etc).  Limit sweet tea, regular sodas and alcoholic beverages, all of which has a lot of calories and sugar.  Up to 1 alcoholic drink daily may be beneficial for women (unless trying to lose weight, watch sugars).  Drink a lot of water.  Calcium recommendations are 1200-1500 mg daily (1500 mg for postmenopausal women or women without ovaries), and vitamin D 1000 IU daily.  This should be obtained from diet and/or supplements (vitamins), and calcium should not be taken all at once, but in divided doses.  Monthly self breast exams and yearly mammograms for women over the age of 40 is recommended.  Sunscreen of at least SPF 30 should be used on all sun-exposed parts of the skin when outside between the hours of 10 am and 4 pm (not just when at beach or pool, but even with exercise, golf, tennis, and yard work!)  Use a sunscreen that says "broad spectrum" so it covers both UVA and UVB rays, and make sure to reapply every 1-2 hours.  Remember to change the batteries in your smoke detectors when changing your clock times in the spring and fall.  Use your seat belt every time you are in a car, and please drive safely and not be distracted with cell phones and texting while driving.  I recommend getting the new shingles vaccine (Shingrix). You will need to check with your insurance to see if it is covered, and if covered, schedule a nurse visit at our  office.  It is a series of 2 injections, spaced 2 months apart.   

## 2018-04-01 ENCOUNTER — Other Ambulatory Visit (INDEPENDENT_AMBULATORY_CARE_PROVIDER_SITE_OTHER): Payer: 59

## 2018-04-01 DIAGNOSIS — Z23 Encounter for immunization: Secondary | ICD-10-CM

## 2018-05-05 DIAGNOSIS — G4733 Obstructive sleep apnea (adult) (pediatric): Secondary | ICD-10-CM | POA: Diagnosis not present

## 2018-05-23 ENCOUNTER — Other Ambulatory Visit: Payer: Self-pay | Admitting: Family Medicine

## 2018-05-23 DIAGNOSIS — E119 Type 2 diabetes mellitus without complications: Secondary | ICD-10-CM

## 2018-05-23 NOTE — Telephone Encounter (Signed)
Left message on voicemail for patient to call back to let me know if she needs refill on metformin before her appointment on 06-02-18.

## 2018-06-02 ENCOUNTER — Other Ambulatory Visit (INDEPENDENT_AMBULATORY_CARE_PROVIDER_SITE_OTHER): Payer: 59

## 2018-06-02 DIAGNOSIS — Z23 Encounter for immunization: Secondary | ICD-10-CM

## 2018-06-03 ENCOUNTER — Other Ambulatory Visit: Payer: Self-pay | Admitting: Family Medicine

## 2018-06-03 DIAGNOSIS — Z1231 Encounter for screening mammogram for malignant neoplasm of breast: Secondary | ICD-10-CM

## 2018-06-11 ENCOUNTER — Other Ambulatory Visit: Payer: Self-pay | Admitting: Family Medicine

## 2018-06-11 DIAGNOSIS — I10 Essential (primary) hypertension: Secondary | ICD-10-CM

## 2018-06-30 ENCOUNTER — Ambulatory Visit
Admission: RE | Admit: 2018-06-30 | Discharge: 2018-06-30 | Disposition: A | Discharge status: Home / Self Care | Payer: 59 | Source: Ambulatory Visit | Attending: Family Medicine | Admitting: Family Medicine

## 2018-06-30 DIAGNOSIS — Z1231 Encounter for screening mammogram for malignant neoplasm of breast: Secondary | ICD-10-CM

## 2018-07-13 ENCOUNTER — Other Ambulatory Visit: Payer: Self-pay | Admitting: Family Medicine

## 2018-07-13 DIAGNOSIS — G4700 Insomnia, unspecified: Secondary | ICD-10-CM

## 2018-07-14 NOTE — Telephone Encounter (Signed)
Is this okay to refill? 

## 2018-07-15 ENCOUNTER — Ambulatory Visit (INDEPENDENT_AMBULATORY_CARE_PROVIDER_SITE_OTHER): Payer: 59 | Admitting: Family Medicine

## 2018-07-15 ENCOUNTER — Encounter: Payer: Self-pay | Admitting: Family Medicine

## 2018-07-15 VITALS — BP 124/70 | HR 67 | Wt 148.4 lb

## 2018-07-15 DIAGNOSIS — K5900 Constipation, unspecified: Secondary | ICD-10-CM | POA: Diagnosis not present

## 2018-07-15 LAB — HEMOCCULT GUIAC POC 1CARD (OFFICE): Fecal Occult Blood, POC: NEGATIVE

## 2018-07-15 MED ORDER — POLYETHYLENE GLYCOL 3350 17 GM/SCOOP PO POWD: 17.0000 g | Freq: Two times a day (BID) | ORAL | 1 refills | Status: DC | PRN

## 2018-07-15 NOTE — Addendum Note (Signed)
Addended by: Minette Headland A on: 07/15/2018 04:43 PM   Modules accepted: Orders

## 2018-07-15 NOTE — Progress Notes (Signed)
   Subjective:    Patient ID: Julie Fischer, female    DOB: October 24, 1960, 58 y.o.   MRN: [id number]  HPI Chief Complaint  Patient presents with  . BM    hasn't had a BM in 3 weeks. usually regular. has taken stool softers last thursday and saturday but not everyday. bloated, nausated at time   She is here with complaints of a three-week history of constipation.  States she is passing gas and small firm pieces of stool.  Typically her pattern is to have a daily bowel movement. Denies any new medications.  No major lifestyle or diet changes.  She feels bloated.  Denies fever, chills, dizziness, chest pain, palpitations, shortness of breath, abdominal pain, nausea, vomiting, urinary symptoms.  She has been taking Fiber choice, Ex Lax.   States she is drinking plenty of water. Walking a lot lately. Eating more salads.   Normal colonoscopy last year and due again in 5 years.      Review of Systems Pertinent positives and negatives in the history of present illness.     Objective:   Physical Exam BP 124/70   Pulse 67   Wt 148 lb 6.4 oz (67.3 kg)   BMI 32.11 kg/m   Alert and oriented and in no acute distress.  She is well-appearing.  Pharyngeal area is normal. Neck is supple without adenopathy or thyromegaly. Cardiac exam shows a regular rhythm without murmurs or gallops. Lungs are clear to auscultation.  Abdomen is soft, nondistended, hyperactive bowel sounds, nontender, no rebound or guarding.  No palpable masses.  Rectal exam shows normal tone, rectum with stool that appears soft, brown, no blood.      Assessment & Plan:  Constipation, unspecified constipation type - Plan: polyethylene glycol powder (GLYCOLAX/MIRALAX) powder  Hemoccult negative.  Discussed the possibility of ordering a abdominal x-ray and she declines. No red flag symptoms.  Unclear as to why she is having constipation at this point since she does not have a history of this. no new medications. Advised to  try MiraLAX this evening you call.  Instructions were given.  She may also try MiraLAX tomorrow if needed week, magnesium citrate or even a fleets enema if she feels as though the stool was not passing through.  There is no sign of obstruction.  Abdominal exam is benign. Colonoscopy is up-to-date. She will call in 2 days to let me know how she is doing or sooner if needed. Strict precautions that if she develops severe abdominal pain, vomiting, has blood in her stool or is unable to move her bowels in spite of the aggressive MiraLAX dosing, she will go to the ED.

## 2018-07-15 NOTE — Patient Instructions (Addendum)
Mix 64 ounces of liquid (Gatorade) with the large container of Miralax and drink one 8 ounce glass every 15 minutes until half the mixture is gone (32 ounces) this evening.   Stop drinking this once you have had 32 ounces and then resume tomorrow if needed.  You can also try a bottle of Magnesium Citrate over the counter tomorrow if you still feel that you are not able to move your bowels.   You may also want to get a Fleets enema and have it on hand in case you feel that stool is not able to pass.   If you develop abdominal pain, fever, vomiting or see blood or pus in your stool then stop drinking this and seek immediate medical attention.   Call in 2 days and let us know how you are doing.     Constipation, Adult Constipation is when a person has fewer bowel movements in a week than normal, has difficulty having a bowel movement, or has stools that are dry, hard, or larger than normal. Constipation may be caused by an underlying condition. It may become worse with age if a person takes certain medicines and does not take in enough fluids. Follow these instructions at home: Eating and drinking   Eat foods that have a lot of fiber, such as fresh fruits and vegetables, whole grains, and beans.  Limit foods that are high in fat, low in fiber, or overly processed, such as french fries, hamburgers, cookies, candies, and soda.  Drink enough fluid to keep your urine clear or pale yellow. General instructions  Exercise regularly or as told by your health care provider.  Go to the restroom when you have the urge to go. Do not hold it in.  Take over-the-counter and prescription medicines only as told by your health care provider. These include any fiber supplements.  Practice pelvic floor retraining exercises, such as deep breathing while relaxing the lower abdomen and pelvic floor relaxation during bowel movements.  Watch your condition for any changes.  Keep all follow-up visits as told  by your health care provider. This is important. Contact a health care provider if:  You have pain that gets worse.  You have a fever.  You do not have a bowel movement after 4 days.  You vomit.  You are not hungry.  You lose weight.  You are bleeding from the anus.  You have thin, pencil-like stools. Get help right away if:  You have a fever and your symptoms suddenly get worse.  You leak stool or have blood in your stool.  Your abdomen is bloated.  You have severe pain in your abdomen.  You feel dizzy or you faint. This information is not intended to replace advice given to you by your health care provider. Make sure you discuss any questions you have with your health care provider. Document Released: 02/03/2004 Document Revised: 11/25/2015 Document Reviewed: 10/26/2015 Elsevier Interactive Patient Education  2019 [employer].

## 2018-07-25 ENCOUNTER — Telehealth: Payer: Self-pay | Admitting: Family Medicine

## 2018-07-25 NOTE — Telephone Encounter (Signed)
I completely forgot Dr. Tomi Bamberger. I'm sorry. I'm sure she will be able too.

## 2018-07-25 NOTE — Telephone Encounter (Signed)
Since I'm out of the office all next week, can you give it to [place] to see if she can do it?  Thanks

## 2018-07-25 NOTE — Telephone Encounter (Signed)
Prior authorization requested for pt's Prolia. Sending form back to be completed. Please return to [facility]

## 2018-07-28 ENCOUNTER — Encounter: Payer: Self-pay | Admitting: *Deleted

## 2018-08-07 DIAGNOSIS — G4733 Obstructive sleep apnea (adult) (pediatric): Secondary | ICD-10-CM | POA: Diagnosis not present

## 2018-08-22 ENCOUNTER — Other Ambulatory Visit (INDEPENDENT_AMBULATORY_CARE_PROVIDER_SITE_OTHER): Payer: 59

## 2018-08-22 ENCOUNTER — Other Ambulatory Visit: Payer: Self-pay

## 2018-08-22 DIAGNOSIS — M810 Age-related osteoporosis without current pathological fracture: Secondary | ICD-10-CM | POA: Diagnosis not present

## 2018-08-22 MED ORDER — DENOSUMAB 60 MG/ML ~~LOC~~ SOSY
60.0000 mg | PREFILLED_SYRINGE | Freq: Once | SUBCUTANEOUS | Status: AC
  Administered 2018-08-22: 60 mg via SUBCUTANEOUS

## 2018-08-23 ENCOUNTER — Other Ambulatory Visit: Payer: Self-pay | Admitting: Family Medicine

## 2018-08-23 DIAGNOSIS — E119 Type 2 diabetes mellitus without complications: Secondary | ICD-10-CM

## 2018-09-19 ENCOUNTER — Other Ambulatory Visit: Payer: 59

## 2018-09-23 ENCOUNTER — Other Ambulatory Visit: Payer: Self-pay

## 2018-09-23 ENCOUNTER — Telehealth: Payer: Self-pay | Admitting: Family Medicine

## 2018-09-23 DIAGNOSIS — E119 Type 2 diabetes mellitus without complications: Secondary | ICD-10-CM

## 2018-09-23 MED ORDER — DULAGLUTIDE 0.75 MG/0.5ML ~~LOC~~ SOAJ: 0.7500 mg | SUBCUTANEOUS | 5 refills | Status: DC

## 2018-09-23 NOTE — Telephone Encounter (Signed)
Done

## 2018-09-23 NOTE — Telephone Encounter (Signed)
Pharmacy sent in refill request for trulicity 818 MC-.37 ml pen qty 4.0 ml four 5 refills, inject .75 mg into the skin every 7 days please send [id number] - [place], [place] - [address] AT [address]

## 2018-09-24 ENCOUNTER — Encounter: Payer: 59 | Admitting: Family Medicine

## 2018-09-28 ENCOUNTER — Other Ambulatory Visit: Payer: Self-pay | Admitting: Family Medicine

## 2018-09-28 DIAGNOSIS — I10 Essential (primary) hypertension: Secondary | ICD-10-CM

## 2018-10-16 ENCOUNTER — Other Ambulatory Visit: Payer: Self-pay | Admitting: Family Medicine

## 2018-10-16 DIAGNOSIS — G4700 Insomnia, unspecified: Secondary | ICD-10-CM

## 2018-10-16 NOTE — Telephone Encounter (Signed)
Is this okay to refill? 

## 2018-10-31 ENCOUNTER — Other Ambulatory Visit: Payer: Self-pay

## 2018-10-31 ENCOUNTER — Other Ambulatory Visit: Payer: 59

## 2018-10-31 DIAGNOSIS — E119 Type 2 diabetes mellitus without complications: Secondary | ICD-10-CM

## 2018-10-31 DIAGNOSIS — Z5181 Encounter for therapeutic drug level monitoring: Secondary | ICD-10-CM

## 2018-11-01 LAB — HEMOGLOBIN A1C
Est. average glucose Bld gHb Est-mCnc: 126 mg/dL
Hgb A1c MFr Bld: 6 % — ABNORMAL HIGH (ref 4.8–5.6)

## 2018-11-02 NOTE — Progress Notes (Signed)
Start time: 11:05 End time: 11:48   Virtual Visit via Video Note  I connected with Julie Fischer on 11/03/2018 by a video enabled telemedicine application and verified that I am speaking with the correct person using two identifiers.  Location: Patient: home, with dog, husband is in another room Provider: office   I discussed the limitations of evaluation and management by telemedicine and the availability of in person appointments. The patient expressed understanding and agreed to proceed. She consents to insurance being filed for this visit.  History of Present Illness:  Chief Complaint  Patient presents with  . Diabetes    VIRTUAL med check. Has been having some sleeping issues, even when taking ambien. Did not have BP cuff.    Patient presents for 6 month follow-up on chronic problems.  She was last seen by Vickie in 06/2018 for 3 week h/o constipation.  She reports this resolved, no further problems.  Diabetes. She was changed from [place] to [place] in March of 0277  (had yeast infection, and sugars remained elevated on Farxiga). She was started on the lower dose, and kept at that dose. A1c had improved from 8.3 in February, to 6.4 in May, and was 5.9 in 03/2018. She continues to tolerate this without side effects. She continues on 1000mg  of metformin daily.  Sugars have been runningin the low to mid 70's, fasting, checking most days. Once saw 149 at bedtime. Denies hypoglycemia (only rarely feels somewhat shaky, relieved by eating), polydipsia, polyuria.  Checks feet regularly and has no concerns/lesions/pain.  Last eye exam was 06/2017, no retinopathy. Hasn't yet scheduled this year's.  OSA--doing well on CPAP. Compliant with daily use. Feels refreshed in the mornings, denies daytime somnolence.  Hypertension follow-up: Hasn't been able to check BP at home (no longer has a monitor).  Denies dizziness, headaches, chest pain. Denies side effects of medications, no  cough.  Depression: Moods seem to be well controlled on the current dose of paroxetine. Moodsimprovedafter quitting her job. A little worse last year related to knee pain and surgery.  Denies side effects of medications.   "I have days I just want to scream", just sporadically, related to virus/quarantine. Overall doing okay.  Hyperlipidemia follow-up: Patient is reportedly following a low-fat, low cholesterol diet. Compliant with medications (simvastatin and fish oil) and denies medication side effects. Lipids were at goal on her last check: Lab Results  Component Value Date   CHOL 146 03/21/2018   HDL 47 03/21/2018   LDLCALC 71 03/21/2018   TRIG 138 03/21/2018   CHOLHDL 3.1 03/21/2018   Osteoporosis: She istolerating Prolia without side effects. Last DEXA was 05/2017, T-2.7 at spine. Changed from fosamax to Prolia after that study. Last injection was 08/22/2018.  Insomnia: requires Ambien CR nightly.  Ambien is effective in treating the insomnia--she doesn't sleep at all if she doesn't take it, "the brain won't shut up". Denies unusual behavior (previously would ask husband for food, text people).  She reports having 4 sleepless nights in the last few months, one related to being out of her medication.  Most recently was Saturday night.  She wanted a movie which put her back to a place where she was really depressed in her life. Fell asleep, but poor sleep.  H/o breast cancer: She is no longer under the care of oncologist (previously sawDr. Truddie Coco.) Last mammogram was 06/2018. She checks her breasts regularly. She has some intermittent tenderness around her scar (chronic, unchanged).     PMH, PSH,  SH reviewed  Outpatient Encounter Medications as of 11/03/2018  Medication Sig Note  . Calcium Carb-Cholecalciferol (CALCIUM 600 + D PO) Take 1 tablet by mouth 2 (two) times daily.   . cetirizine (ZYRTEC) 10 MG tablet Take 10 mg by mouth daily.    . Dulaglutide (TRULICITY) 3.32  RJ/1.8AC SOPN Inject 0.75 mg into the skin every 7 (seven) days.   Marland Kitchen lisinopril (ZESTRIL) 10 MG tablet TAKE 1 TABLET DAILY   . metFORMIN (GLUCOPHAGE-XR) 500 MG 24 hr tablet TAKE 2 TABLETS DAILY   . Multiple Vitamin (MULTIVITAMIN WITH MINERALS) TABS tablet Take 1 tablet by mouth 2 (two) times daily.   . Multiple Vitamins-Minerals (HAIR SKIN AND NAILS FORMULA PO) Take 1 tablet by mouth daily.   . Omega-3 1000 MG CAPS Take 1,000 mg by mouth 2 (two) times daily.   Marland Kitchen omeprazole (PRILOSEC OTC) 20 MG tablet Take 1 capsule up to twice daily for the next 1-2 weeks (Patient taking differently: Take 20 mg daily as needed by mouth (for acid reflux or heartburn). ) 11/03/2018: Uses prn heartburn, once every couple of weeks  . PARoxetine (PAXIL) 40 MG tablet Take 1 tablet (40 mg total) by mouth at bedtime.   . simvastatin (ZOCOR) 20 MG tablet Take 1 tablet (20 mg total) by mouth at bedtime.   Marland Kitchen zolpidem (AMBIEN CR) 12.5 MG CR tablet TAKE 1 TABLET AT BEDTIME   . [DISCONTINUED] RESTASIS 0.05 % ophthalmic emulsion INSTILL 1 DROP IN EACH EYE 2 TIMES DAILY   . acetaminophen (TYLENOL) 500 MG tablet Take 1,500 mg every 6 (six) hours as needed by mouth for moderate pain or headache.    . Alpha-D-Galactosidase (BEANO PO) Take 1 tablet by mouth daily.   Marland Kitchen LYSINE PO Take 1 tablet by mouth daily as needed (cold sore).   . methocarbamol (ROBAXIN) 500 MG tablet Take 1 tablet (500 mg total) by mouth every 6 (six) hours as needed for muscle spasms. (Patient not taking: Reported on 11/03/2018)   . polyethylene glycol powder (GLYCOLAX/MIRALAX) powder Take 17 g by mouth 2 (two) times daily as needed. (Patient not taking: Reported on 11/03/2018)   . simethicone (MYLICON) 166 MG chewable tablet Chew 125 mg every 6 (six) hours as needed by mouth for flatulence.   . valACYclovir (VALTREX) 1000 MG tablet TAKE 2 TABLETS AT ONSET OF COLD SORE AND REPEAT IN 12 HOURS (4 TABLETS PER EPISODE OF COLD SORE) (Patient not taking: Reported on  11/03/2018)   . [DISCONTINUED] traMADol (ULTRAM) 50 MG tablet Take 1-2 tablets (50-100 mg total) by mouth every 6 (six) hours as needed for moderate pain. (Patient not taking: Reported on 07/15/2018)    Facility-Administered Encounter Medications as of 11/03/2018  Medication  . 0.9 %  sodium chloride infusion   Allergies  Allergen Reactions  . Heparin Other (See Comments)    Low platelets  . Niacin And Related Other (See Comments)    Blotchy/itchy  . Tessalon [Benzonatate] Diarrhea  . Penicillins Rash and Other (See Comments)    Has patient had a PCN reaction causing immediate rash, facial/tongue/throat swelling, SOB or lightheadedness with hypotension: Unknown Has patient had a PCN reaction causing severe rash involving mucus membranes or skin necrosis: Unknown Has patient had a PCN reaction that required hospitalization: No Has patient had a PCN reaction occurring within the last 10 years: No If all of the above answers are "NO", then may proceed with Cephalosporin use.     ROS:  No fever, chills, URI symptoms,  headaches, dizziness, chest pain, palpitations, shortness of breath, edema.  No bleeding, bruising, rashes. Chronic insomnia, per HPI. Moods are "as good as can be expected" with pandemic (some bad days, scattered). S/p right knee replacement--has some residual pain, doesn't limit her activity, just slows her down some Heartburn in the evenings occasionally, infrequent. Constipation resolved   Observations/Objective:  Ht 4\' 9"  (1.448 m)   Wt 150 lb (68 kg) Comment: fully dressed  BMI 32.46 kg/m  BP was checked at lab visit, see below. BP Readings from Last 3 Encounters:  10/31/18 94/62  07/15/18 124/70  03/26/18 120/70    Wt Readings from Last 3 Encounters:  10/31/18 149 lb 6.4 oz (67.8 kg)  07/15/18 148 lb 6.4 oz (67.3 kg)  03/26/18 145 lb 9.6 oz (66 kg)   Well appearing, pleasant female, in good spirits. She is alert, oriented, cranial nerves are grossly  intact. Normal mood, affect, grooming. Exam is limited due to virtual nature of the visit.  Lab Results  Component Value Date   HGBA1C 6.0 (H) 10/31/2018     Assessment and Plan:  Type 2 diabetes mellitus without complication, without long-term current use of insulin ([place]) - controlled on current regimen of Metformin and Trulicity. Discussed recall of metformin ER - Plan: metFORMIN (GLUCOPHAGE-XR) 500 MG 24 hr tablet, Hemoglobin A1c, TSH, Microalbumin / creatinine urine ratio  Depression, major, in remission ([place]) - overall doing well, with recent stressors. Continue Paxil   Osteoporosis, unspecified osteoporosis type, unspecified pathological fracture presence - continue Prolia. continue Ca, D, weight-bearing exercise. counseled re: increased fx risk if stops Prolia (would change to fosamax)  Insomnia, unspecified type - continue Ambien CR nightly. Discussed mindfulness, relaation techniques to help on difficult nights  Pure hypercholesterolemia - at goal per last check, cont statin - Plan: simvastatin (ZOCOR) 20 MG tablet, Lipid panel  Essential hypertension, benign - controlled--last check here was low. To resume checking at home and contact us if BP remains <100. - Plan: Comprehensive metabolic panel  Medication monitoring encounter - Plan: Hemoglobin A1c, CBC with Differential/Platelet, Lipid panel, Microalbumin / creatinine urine ratio, Comprehensive metabolic panel  Reminded to schedule diabetic eye exam.  Discussed recall of metformin ER and to check with pharmacy. Change to short-acting if availability issues.  F/u in 6 months for CPE/med check with labs prior.  DEXA due 05/2019 (can order at physical)  Follow Up Instructions:  AVS sent to pt.  I discussed the assessment and treatment plan with the patient. The patient was provided an opportunity to ask questions and all were answered. The patient agreed with the plan and demonstrated an understanding of the  instructions.   The patient was advised to call back or seek an in-person evaluation if the symptoms worsen or if the condition fails to improve as anticipated.  I provided 43 minutes of non-face-to-face time during this encounter.   Vikki Ports, MD

## 2018-11-03 ENCOUNTER — Ambulatory Visit (INDEPENDENT_AMBULATORY_CARE_PROVIDER_SITE_OTHER): Payer: 59 | Admitting: Family Medicine

## 2018-11-03 ENCOUNTER — Other Ambulatory Visit: Payer: Self-pay

## 2018-11-03 ENCOUNTER — Encounter: Payer: Self-pay | Admitting: Family Medicine

## 2018-11-03 VITALS — Ht <= 58 in | Wt 150.0 lb

## 2018-11-03 DIAGNOSIS — Z5181 Encounter for therapeutic drug level monitoring: Secondary | ICD-10-CM

## 2018-11-03 DIAGNOSIS — G4700 Insomnia, unspecified: Secondary | ICD-10-CM | POA: Diagnosis not present

## 2018-11-03 DIAGNOSIS — E119 Type 2 diabetes mellitus without complications: Secondary | ICD-10-CM

## 2018-11-03 DIAGNOSIS — F325 Major depressive disorder, single episode, in full remission: Secondary | ICD-10-CM

## 2018-11-03 DIAGNOSIS — E7800 Pure hypercholesterolemia, unspecified: Secondary | ICD-10-CM

## 2018-11-03 DIAGNOSIS — M810 Age-related osteoporosis without current pathological fracture: Secondary | ICD-10-CM | POA: Diagnosis not present

## 2018-11-03 DIAGNOSIS — I10 Essential (primary) hypertension: Secondary | ICD-10-CM

## 2018-11-03 MED ORDER — METFORMIN HCL ER 500 MG PO TB24: 1000.0000 mg | ORAL_TABLET | Freq: Every day | ORAL | 1 refills | Status: DC

## 2018-11-03 MED ORDER — SIMVASTATIN 20 MG PO TABS: 20.0000 mg | ORAL_TABLET | Freq: Every day | ORAL | 1 refills | Status: DC

## 2018-11-03 NOTE — Progress Notes (Signed)
Done

## 2018-11-03 NOTE — Patient Instructions (Addendum)
Since your fasting sugars are all excellent, I'd prefer if you could check the morning ones less often, and in place check some afternoon/evening ones.  For example, check the mornings 3-4 times/week, and check 2 hours after a meal (or at bedtime) the other days of the week.  Record them on a sheet of paper that has two different columns, one for morning and one for evening/after meals.  Please call to schedule your yearly diabetic eye exam.  If you can find your blood pressure monitor and starting checking regularly, that would be great.  It was pretty low at your lab visit.  It doesn't need to be that low.  If it is consistently <100/60 we can adjust your medication.  Goal is <130/80, and you previously had been running 120/70 range.  If there is an issue with recall of your extended-release metformin, we should try a local pharmacy. If not available elsewhere, we can change to a short-acting metformin taken twice a day instead.    Mindfulness-Based Stress Reduction Mindfulness-based stress reduction (MBSR) is a program that helps people learn to practice mindfulness. Mindfulness is the practice of intentionally paying attention to the present moment. It can be learned and practiced through techniques such as education, breathing exercises, meditation, and yoga. MBSR includes several mindfulness techniques in one program. MBSR works best when you understand the treatment, are willing to try new things, and can commit to spending time practicing what you learn. MBSR training may include learning about:  How your emotions, thoughts, and reactions affect your body.  New ways to respond to things that cause negative thoughts to start (triggers).  How to notice your thoughts and let go of them.  Practicing awareness of everyday things that you normally do without thinking.  The techniques and goals of different types of meditation. What are the benefits of MBSR? MBSR can have many benefits,  which include helping you to:  Develop self-awareness. This refers to knowing and understanding yourself.  Learn skills and attitudes that help you to participate in your own health care.  Learn new ways to care for yourself.  Be more accepting about how things are, and let things go.  Be less judgmental and approach things with an open mind.  Be patient with yourself and trust yourself more. MBSR has also been shown to:  Reduce negative emotions, such as depression and anxiety.  Improve memory and focus.  Change how you sense and approach pain.  Boost your body's ability to fight infections.  Help you connect better with other people.  Improve your sense of well-being. Follow these instructions at home:   Find a local in-person or online MBSR program.  Set aside some time regularly for mindfulness practice.  Find a mindfulness practice that works best for you. This may include one or more of the following: ? Meditation. Meditation involves focusing your mind on a certain thought or activity. ? Breathing awareness exercises. These help you to stay present by focusing on your breath. ? Body scan. For this practice, you lie down and pay attention to each part of your body from head to toe. You can identify tension and soreness and intentionally relax parts of your body. ? Yoga. Yoga involves stretching and breathing, and it can improve your ability to move and be flexible. It can also provide an experience of testing your body's limits, which can help you release stress. ? Mindful eating. This way of eating involves focusing on the taste, texture,  color, and smell of each bite of food. Because this slows down eating and helps you feel full sooner, it can be an important part of a weight-loss plan.  Find a podcast or recording that provides guidance for breathing awareness, body scan, or meditation exercises. You can listen to these any time when you have a free moment to rest  without distractions.  Follow your treatment plan as told by your health care provider. This may include taking regular medicines and making changes to your diet or lifestyle as recommended. How to practice mindfulness To do a basic awareness exercise:  Find a comfortable place to sit.  Pay attention to the present moment. Observe your thoughts, feelings, and surroundings just as they are.  Avoid placing judgment on yourself, your feelings, or your surroundings. Make note of any judgment that comes up, and let it go.  Your mind may wander, and that is okay. Make note of when your thoughts drift, and return your attention to the present moment. To do basic mindfulness meditation:  Find a comfortable place to sit. This may include a stable chair or a firm floor cushion. ? Sit upright with your back straight. Let your arms fall next to your side with your hands resting on your legs. ? If sitting in a chair, rest your feet flat on the floor. ? If sitting on a cushion, cross your legs in front of you.  Keep your head in a neutral position with your chin dropped slightly. Relax your jaw and rest the tip of your tongue on the roof of your mouth. Drop your gaze to the floor. You can close your eyes if you like.  Breathe normally and pay attention to your breath. Feel the air moving in and out of your nose. Feel your belly expanding and relaxing with each breath.  Your mind may wander, and that is okay. Make note of when your thoughts drift, and return your attention to your breath.  Avoid placing judgment on yourself, your feelings, or your surroundings. Make note of any judgment or feelings that come up, let them go, and bring your attention back to your breath.  When you are ready, lift your gaze or open your eyes. Pay attention to how your body feels after the meditation. Where to find more information You can find more information about MBSR from:  Your health care  provider.  Community-based meditation centers or programs.  Programs offered near you. Summary  Mindfulness-based stress reduction (MBSR) is a program that teaches you how to intentionally pay attention to the present moment. It is used with other treatments to help you cope better with daily stress, emotions, and pain.  MBSR focuses on developing self-awareness, which allows you to respond to life stress without judgment or negative emotions.  MBSR programs may involve learning different mindfulness practices, such as breathing exercises, meditation, yoga, body scan, or mindful eating. Find a mindfulness practice that works best for you, and set aside time for it on a regular basis. This information is not intended to replace advice given to you by your health care provider. Make sure you discuss any questions you have with your health care provider. Document Released: 09/13/2016 Document Revised: 09/13/2016 Document Reviewed: 09/13/2016 Elsevier Interactive Patient Education  2019 [employer].

## 2018-12-10 ENCOUNTER — Telehealth: Payer: Self-pay | Admitting: Family Medicine

## 2018-12-10 NOTE — Telephone Encounter (Signed)
Pt going on vacation for a month, she needs Trulicity sent in to [facility] She does not have enough to last until she gets back from trip

## 2018-12-10 NOTE — Telephone Encounter (Signed)
Called pharmacy and they will have ready tomorrow am.

## 2018-12-27 ENCOUNTER — Other Ambulatory Visit: Payer: Self-pay | Admitting: Family Medicine

## 2018-12-27 DIAGNOSIS — I10 Essential (primary) hypertension: Secondary | ICD-10-CM

## 2019-01-13 ENCOUNTER — Other Ambulatory Visit: Payer: Self-pay | Admitting: Family Medicine

## 2019-01-13 DIAGNOSIS — G4700 Insomnia, unspecified: Secondary | ICD-10-CM

## 2019-01-13 NOTE — Telephone Encounter (Signed)
Is this okay to refill? 

## 2019-02-26 ENCOUNTER — Other Ambulatory Visit: Payer: 59

## 2019-02-27 LAB — HM DIABETES EYE EXAM

## 2019-03-09 ENCOUNTER — Other Ambulatory Visit: Payer: Self-pay

## 2019-03-09 ENCOUNTER — Other Ambulatory Visit: Payer: 59

## 2019-03-09 DIAGNOSIS — Z23 Encounter for immunization: Secondary | ICD-10-CM

## 2019-03-09 DIAGNOSIS — M810 Age-related osteoporosis without current pathological fracture: Secondary | ICD-10-CM

## 2019-03-09 MED ORDER — DENOSUMAB 60 MG/ML ~~LOC~~ SOSY
60.0000 mg | PREFILLED_SYRINGE | Freq: Once | SUBCUTANEOUS | Status: AC
  Administered 2019-03-09: 14:00:00 60 mg via SUBCUTANEOUS

## 2019-03-11 ENCOUNTER — Encounter: Payer: Self-pay | Admitting: *Deleted

## 2019-04-10 ENCOUNTER — Ambulatory Visit (INDEPENDENT_AMBULATORY_CARE_PROVIDER_SITE_OTHER): Payer: 59 | Admitting: Medical

## 2019-04-10 ENCOUNTER — Encounter: Payer: Self-pay | Admitting: Medical

## 2019-04-10 ENCOUNTER — Other Ambulatory Visit: Payer: Self-pay

## 2019-04-10 VITALS — BP 130/78 | HR 78 | Temp 97.8°F | Ht <= 58 in | Wt 154.4 lb

## 2019-04-10 DIAGNOSIS — N952 Postmenopausal atrophic vaginitis: Secondary | ICD-10-CM | POA: Insufficient documentation

## 2019-04-10 DIAGNOSIS — R350 Frequency of micturition: Secondary | ICD-10-CM | POA: Diagnosis not present

## 2019-04-10 DIAGNOSIS — Z90710 Acquired absence of both cervix and uterus: Secondary | ICD-10-CM

## 2019-04-10 DIAGNOSIS — R300 Dysuria: Secondary | ICD-10-CM | POA: Diagnosis not present

## 2019-04-10 DIAGNOSIS — Z780 Asymptomatic menopausal state: Secondary | ICD-10-CM

## 2019-04-10 DIAGNOSIS — N898 Other specified noninflammatory disorders of vagina: Secondary | ICD-10-CM

## 2019-04-10 LAB — POCT URINALYSIS DIP (PROADVANTAGE DEVICE)
Blood, UA: NEGATIVE
Glucose, UA: NEGATIVE mg/dL
Leukocytes, UA: NEGATIVE
Nitrite, UA: NEGATIVE
Protein Ur, POC: NEGATIVE mg/dL
Specific Gravity, Urine: 1.025
Urobilinogen, Ur: NEGATIVE
pH, UA: 5.5 (ref 5.0–8.0)

## 2019-04-10 MED ORDER — NITROFURANTOIN MONOHYD MACRO 100 MG PO CAPS: 100.0000 mg | ORAL_CAPSULE | Freq: Two times a day (BID) | ORAL | 0 refills | Status: DC

## 2019-04-10 NOTE — Progress Notes (Signed)
Subjective:  Julie Fischer is a 58 y.o. female who presents for Chief Complaint  Patient presents with  . Urinary Tract Infection    burning with urination, frequent urination x 1 week      Here for possible UTI.  No hx/o UTI.  She notes few day hx/o discomfort with urination, hurts to wipe, feels like sandpaper on tissue against privates, some odor in urine.  Some urgency.  No frequency, no blood in urine, no fever, no  belly or back pain, no nausea, no vomiting, no loose stools, no vagina discharge.  Does have vaginal dryness x 1 week as well as the urinary symptoms x 1 week.   Not sexually active but is married.   Has been using OTC lubrication she just bought for dryness.   No other aggravating or relieving factors.    No other c/o.  The following portions of the patient's history were reviewed and updated as appropriate: allergies, current medications, past family history, past medical history, past social history, past surgical history and problem list.  ROS Otherwise as in subjective above  Past Medical History:  Diagnosis Date  . Allergy    fall seasonal  . Anxiety   . Blood transfusion    Platelet transfusion when on heparin  . Breast cancer ([place]) 2001   R breast(lumpectomy,chemo,radiation,tamoxifen)  . C. difficile diarrhea 02/2016   treated with Flagyl  . Complication of anesthesia    gets Julie Fischer going under she has been told  . Depression    treated  . Diabetes mellitus    type 2  . GERD (gastroesophageal reflux disease)   . Hyperlipidemia   . Hypertension    resolved after weight loss surgery; recurred 2013  . Insomnia chronic  . Iron deficiency    h/o  . Microalbuminuria    h/o  . OA (osteoarthritis) of knee   . Osteoporosis   . Personal history of chemotherapy   . Personal history of radiation therapy   . Plantar fasciitis (07') [facility]   s/p surgical release 01/2011  . Sleep apnea    resolved after weight loss surgery; recurrent with weight  gain; on CPAP  . Vitamin D deficiency    Current Outpatient Medications on File Prior to Visit  Medication Sig Dispense Refill  . acetaminophen (TYLENOL) 500 MG tablet Take 1,500 mg every 6 (six) hours as needed by mouth for moderate pain or headache.     . Alpha-D-Galactosidase (BEANO PO) Take 1 tablet by mouth daily.    . Calcium Carb-Cholecalciferol (CALCIUM 600 + D PO) Take 1 tablet by mouth 2 (two) times daily.    . cetirizine (ZYRTEC) 10 MG tablet Take 10 mg by mouth daily.     . Dulaglutide (TRULICITY) A999333 0000000 SOPN Inject 0.75 mg into the skin every 7 (seven) days. 12 pen 5  . lisinopril (ZESTRIL) 10 MG tablet TAKE 1 TABLET DAILY 90 tablet 0  . LYSINE PO Take 1 tablet by mouth daily as needed (cold sore).    . metFORMIN (GLUCOPHAGE-XR) 500 MG 24 hr tablet Take 2 tablets (1,000 mg total) by mouth daily. 180 tablet 1  . methocarbamol (ROBAXIN) 500 MG tablet Take 1 tablet (500 mg total) by mouth every 6 (six) hours as needed for muscle spasms. 40 tablet 0  . Multiple Vitamin (MULTIVITAMIN WITH MINERALS) TABS tablet Take 1 tablet by mouth 2 (two) times daily.    . Multiple Vitamins-Minerals (HAIR SKIN AND NAILS FORMULA PO) Take 1 tablet  by mouth daily.    . Omega-3 1000 MG CAPS Take 1,000 mg by mouth 2 (two) times daily.    Marland Kitchen omeprazole (PRILOSEC OTC) 20 MG tablet Take 1 capsule up to twice daily for the next 1-2 weeks (Patient taking differently: Take 20 mg daily as needed by mouth (for acid reflux or heartburn). ) 28 tablet 0  . PARoxetine (PAXIL) 40 MG tablet Take 1 tablet (40 mg total) by mouth at bedtime. 90 tablet 3  . simethicone (MYLICON) 0000000 MG chewable tablet Chew 125 mg every 6 (six) hours as needed by mouth for flatulence.    . simvastatin (ZOCOR) 20 MG tablet Take 1 tablet (20 mg total) by mouth at bedtime. 90 tablet 1  . zolpidem (AMBIEN CR) 12.5 MG CR tablet TAKE 1 TABLET AT BEDTIME 90 tablet 0  . polyethylene glycol powder (GLYCOLAX/MIRALAX) powder Take 17 g by mouth 2  (two) times daily as needed. (Patient not taking: Reported on 11/03/2018) 3350 g 1  . valACYclovir (VALTREX) 1000 MG tablet TAKE 2 TABLETS AT ONSET OF COLD SORE AND REPEAT IN 12 HOURS (4 TABLETS PER EPISODE OF COLD SORE) (Patient not taking: Reported on 11/03/2018) 28 tablet 1   Current Facility-Administered Medications on File Prior to Visit  Medication Dose Route Frequency Provider Last Rate Last Dose  . 0.9 %  sodium chloride infusion  500 mL Intravenous Once Milus Banister, MD        Objective: BP 130/78   Pulse 78   Temp 97.8 F (36.6 C)   Ht 4\' 9"  (1.448 m)   Wt 154 lb 6.4 oz (70 kg)   SpO2 98%   BMI 33.41 kg/m   General appearance: alert, no distress, well developed, well nourished Abdomen: +bs, soft, non tender, non distended, no masses, no hepatomegaly, no splenomegaly Back: non tender Gyn: atrophic changes, no erythema of urethra, no discharge, non tender, no obvious mass, swab taken, exam chaperoned by nurse    Assessment: Encounter Diagnoses  Name Primary?  . Frequent urination Yes  . Dysuria   . Vaginitis, atrophic   . Vaginal dryness   . Post-menopausal   . S/P hysterectomy      Plan: Negative wet prep.  Reviewed UA.  Urine culture sent.  Begin Macrobid empirically.  Hydrate well with water, cranberry juice a few days  Try some olive oil for lubrication since current therapy isn't helping  F/u soon with Dr. Tomi Fischer as planned for regularly follow up and to discuss dryness, atrophic changes.   [name] was seen today for urinary tract infection.  Diagnoses and all orders for this visit:  Frequent urination -     POCT Urinalysis DIP (Proadvantage Device) -     Urine culture  Dysuria -     Urine culture  Vaginitis, atrophic  Vaginal dryness  Post-menopausal  S/P hysterectomy  Other orders -     nitrofurantoin, macrocrystal-monohydrate, (MACROBID) 100 MG capsule; Take 1 capsule (100 mg total) by mouth 2 (two) times daily.   Follow up:  pending labs, December with Dr. Tomi Fischer, PCP

## 2019-04-10 NOTE — Progress Notes (Signed)
Done

## 2019-04-11 LAB — URINE CULTURE

## 2019-04-13 ENCOUNTER — Telehealth: Payer: Self-pay | Admitting: *Deleted

## 2019-04-13 ENCOUNTER — Other Ambulatory Visit: Payer: Self-pay | Admitting: Medical

## 2019-04-13 DIAGNOSIS — L989 Disorder of the skin and subcutaneous tissue, unspecified: Secondary | ICD-10-CM

## 2019-04-13 DIAGNOSIS — G4700 Insomnia, unspecified: Secondary | ICD-10-CM

## 2019-04-13 MED ORDER — ZOLPIDEM TARTRATE ER 12.5 MG PO TBCR: 12.5000 mg | EXTENDED_RELEASE_TABLET | Freq: Every day | ORAL | 0 refills | Status: DC

## 2019-04-13 NOTE — Telephone Encounter (Signed)
Patient asking for a refill on her Lorrin Mais to [facility].

## 2019-04-13 NOTE — Telephone Encounter (Signed)
error 

## 2019-04-15 ENCOUNTER — Other Ambulatory Visit: Payer: Self-pay | Admitting: *Deleted

## 2019-04-15 DIAGNOSIS — L989 Disorder of the skin and subcutaneous tissue, unspecified: Secondary | ICD-10-CM

## 2019-04-19 ENCOUNTER — Other Ambulatory Visit: Payer: Self-pay | Admitting: Family Medicine

## 2019-04-19 DIAGNOSIS — I10 Essential (primary) hypertension: Secondary | ICD-10-CM

## 2019-04-19 DIAGNOSIS — F325 Major depressive disorder, single episode, in full remission: Secondary | ICD-10-CM

## 2019-04-28 ENCOUNTER — Telehealth: Payer: Self-pay | Admitting: Family Medicine

## 2019-04-28 DIAGNOSIS — G4700 Insomnia, unspecified: Secondary | ICD-10-CM

## 2019-04-28 MED ORDER — ZOLPIDEM TARTRATE ER 12.5 MG PO TBCR: 12.5000 mg | EXTENDED_RELEASE_TABLET | Freq: Every day | ORAL | 0 refills | Status: DC

## 2019-04-28 NOTE — Telephone Encounter (Signed)
The prescription was done 11/23--seems odd she wouldn't have gotten it yet. She should contact them (that is also the "fill date" listed on the PDMP, as well as the "date written") I sent in #5 to local pharmacy, hope that's enough!

## 2019-04-28 NOTE — Telephone Encounter (Signed)
Called and informed pt.  

## 2019-04-28 NOTE — Telephone Encounter (Signed)
Pt called and is requesting a couple days worth of ambein states it was sent into the mail order pharmacy and she has not gotten it yet, and would like some be called in to the local pharmacy [id number] - [place], [place]. AT [address]

## 2019-04-30 ENCOUNTER — Encounter: Payer: Self-pay | Admitting: Family Medicine

## 2019-05-04 ENCOUNTER — Encounter: Payer: Self-pay | Admitting: Family Medicine

## 2019-05-04 DIAGNOSIS — G4700 Insomnia, unspecified: Secondary | ICD-10-CM

## 2019-05-05 MED ORDER — ZOLPIDEM TARTRATE ER 12.5 MG PO TBCR: 12.5000 mg | EXTENDED_RELEASE_TABLET | Freq: Every day | ORAL | 0 refills | Status: DC

## 2019-05-11 ENCOUNTER — Other Ambulatory Visit: Payer: Self-pay

## 2019-05-11 ENCOUNTER — Other Ambulatory Visit: Payer: 59

## 2019-05-11 DIAGNOSIS — I10 Essential (primary) hypertension: Secondary | ICD-10-CM

## 2019-05-11 DIAGNOSIS — Z5181 Encounter for therapeutic drug level monitoring: Secondary | ICD-10-CM

## 2019-05-11 DIAGNOSIS — E7800 Pure hypercholesterolemia, unspecified: Secondary | ICD-10-CM

## 2019-05-11 DIAGNOSIS — E119 Type 2 diabetes mellitus without complications: Secondary | ICD-10-CM

## 2019-05-12 LAB — CBC WITH DIFFERENTIAL/PLATELET
Basophils Absolute: 0 10*3/uL (ref 0.0–0.2)
Basos: 1 %
EOS (ABSOLUTE): 0.3 10*3/uL (ref 0.0–0.4)
Eos: 4 %
Hematocrit: 35.3 % (ref 34.0–46.6)
Hemoglobin: 11.3 g/dL (ref 11.1–15.9)
Immature Grans (Abs): 0 10*3/uL (ref 0.0–0.1)
Immature Granulocytes: 0 %
Lymphocytes Absolute: 2.8 10*3/uL (ref 0.7–3.1)
Lymphs: 38 %
MCH: 27.9 pg (ref 26.6–33.0)
MCHC: 32 g/dL (ref 31.5–35.7)
MCV: 87 fL (ref 79–97)
Monocytes Absolute: 0.6 10*3/uL (ref 0.1–0.9)
Monocytes: 9 %
Neutrophils Absolute: 3.5 10*3/uL (ref 1.4–7.0)
Neutrophils: 48 %
Platelets: 259 10*3/uL (ref 150–450)
RBC: 4.05 x10E6/uL (ref 3.77–5.28)
RDW: 14.2 % (ref 11.7–15.4)
WBC: 7.3 10*3/uL (ref 3.4–10.8)

## 2019-05-12 LAB — COMPREHENSIVE METABOLIC PANEL
ALT: 30 IU/L (ref 0–32)
AST: 30 IU/L (ref 0–40)
Albumin/Globulin Ratio: 2.3 — ABNORMAL HIGH (ref 1.2–2.2)
Albumin: 4.8 g/dL (ref 3.8–4.9)
Alkaline Phosphatase: 45 IU/L (ref 39–117)
BUN/Creatinine Ratio: 19 (ref 12–28)
BUN: 11 mg/dL (ref 8–27)
Bilirubin Total: 0.2 mg/dL (ref 0.0–1.2)
CO2: 22 mmol/L (ref 20–29)
Calcium: 9.8 mg/dL (ref 8.7–10.3)
Chloride: 103 mmol/L (ref 96–106)
Creatinine, Ser: 0.58 mg/dL (ref 0.57–1.00)
GFR calc Af Amer: 116 mL/min/{1.73_m2} (ref >59)
GFR calc non Af Amer: 101 mL/min/{1.73_m2} (ref >59)
Globulin, Total: 2.1 g/dL (ref 1.5–4.5)
Glucose: 100 mg/dL — ABNORMAL HIGH (ref 65–99)
Potassium: 4.5 mmol/L (ref 3.5–5.2)
Sodium: 142 mmol/L (ref 134–144)
Total Protein: 6.9 g/dL (ref 6.0–8.5)

## 2019-05-12 LAB — LIPID PANEL
Chol/HDL Ratio: 3 ratio (ref 0.0–4.4)
Cholesterol, Total: 142 mg/dL (ref 100–199)
HDL: 47 mg/dL (ref >39)
LDL Chol Calc (NIH): 70 mg/dL (ref 0–99)
Triglycerides: 144 mg/dL (ref 0–149)
VLDL Cholesterol Cal: 25 mg/dL (ref 5–40)

## 2019-05-12 LAB — HEMOGLOBIN A1C
Est. average glucose Bld gHb Est-mCnc: 137 mg/dL
Hgb A1c MFr Bld: 6.4 % — ABNORMAL HIGH (ref 4.8–5.6)

## 2019-05-12 LAB — MICROALBUMIN / CREATININE URINE RATIO
Creatinine, Urine: 118.6 mg/dL
Microalb/Creat Ratio: 9 mg/g creat (ref 0–29)
Microalbumin, Urine: 10.6 ug/mL

## 2019-05-12 LAB — TSH: TSH: 1.02 u[IU]/mL (ref 0.450–4.500)

## 2019-05-13 ENCOUNTER — Encounter: Payer: Self-pay | Admitting: Family Medicine

## 2019-05-15 ENCOUNTER — Other Ambulatory Visit: Payer: Self-pay | Admitting: Family Medicine

## 2019-05-15 DIAGNOSIS — E7800 Pure hypercholesterolemia, unspecified: Secondary | ICD-10-CM

## 2019-05-17 NOTE — Patient Instructions (Addendum)
HEALTH MAINTENANCE RECOMMENDATIONS:  It is recommended that you get at least 30 minutes of aerobic exercise at least 5 days/week (for weight loss, you may need as much as 60-90 minutes). This can be any activity that gets your heart rate up. This can be divided in 10-15 minute intervals if needed, but try and build up your endurance at least once a week.  Weight bearing exercise is also recommended twice weekly.  Eat a healthy diet with lots of vegetables, fruits and fiber.  "Colorful" foods have a lot of vitamins (ie green vegetables, tomatoes, red peppers, etc).  Limit sweet tea, regular sodas and alcoholic beverages, all of which has a lot of calories and sugar.  Up to 1 alcoholic drink daily may be beneficial for women (unless trying to lose weight, watch sugars).  Drink a lot of water.  Calcium recommendations are 1200-1500 mg daily (1500 mg for postmenopausal women or women without ovaries), and vitamin D 1000 IU daily.  This should be obtained from diet and/or supplements (vitamins), and calcium should not be taken all at once, but in divided doses.  Monthly self breast exams and yearly mammograms for women over the age of 52 is recommended.  Sunscreen of at least SPF 30 should be used on all sun-exposed parts of the skin when outside between the hours of 10 am and 4 pm (not just when at beach or pool, but even with exercise, golf, tennis, and yard work!)  Use a sunscreen that says "broad spectrum" so it covers both UVA and UVB rays, and make sure to reapply every 1-2 hours.  Remember to change the batteries in your smoke detectors when changing your clock times in the spring and fall. Carbon monoxide detectors are recommended.  Use your seat belt every time you are in a car, and please drive safely and not be distracted with cell phones and texting while driving.  You are due for bone density test.  [facility] and schedule this (you can try and do it the same day as your  next mammogram, due 06/2019).  We briefly discussed the [facility] clinic through [facility] (Dr. Leafy Ro, Marshia Ly)  MyPlate from [place] is an outline of a general healthy diet based on the 2010 Dietary Guidelines for Americans, from the U.S. Department of Agriculture Scientist, research (physical sciences)). It sets guidelines for how much food you should eat from each food group based on your age, sex, and level of physical activity. What are tips for following MyPlate? To follow MyPlate recommendations:  Eat a wide variety of fruits and vegetables, grains, and protein foods.  Serve smaller portions and eat less food throughout the day.  Limit portion sizes to avoid overeating.  Enjoy your food.  Get at least 150 minutes of exercise every week. This is about 30 minutes each day, 5 or more days per week. It can be difficult to have every meal look like MyPlate. Think about MyPlate as eating guidelines for an entire day, rather than each individual meal. Fruits and vegetables  Make half of your plate fruits and vegetables.  Eat many different colors of fruits and vegetables each day.  For a 2,000 calorie daily food plan, eat: ? 2 cups of vegetables every day. ? 2 cups of fruit every day.  1 cup is equal to: ? 1 cup raw or cooked vegetables. ? 1 cup raw fruit. ? 1 medium-sized orange, apple, or banana. ? 1 cup 100% fruit  or vegetable juice. ? 2 cups raw leafy greens, such as lettuce, spinach, or kale. ?  cup dried fruit. Grains  One fourth of your plate should be grains.  Make at least half of the grains you eat each day whole grains.  For a 2,000 calorie daily food plan, eat 6 oz of grains every day.  1 oz is equal to: ? 1 slice bread. ? 1 cup cereal. ?  cup cooked rice, cereal, or pasta. Protein  One fourth of your plate should be protein.  Eat a wide variety of protein foods, including meat, poultry, fish, eggs, beans, nuts, and tofu.  For a 2,000 calorie daily  food plan, eat 5 oz of protein every day.  1 oz is equal to: ? 1 oz meat, poultry, or fish. ?  cup cooked beans. ? 1 egg. ?  oz nuts or seeds. ? 1 Tbsp peanut butter. Dairy  Drink fat-free or low-fat (1%) milk.  Eat or drink dairy as a side to meals.  For a 2,000 calorie daily food plan, eat or drink 3 cups of dairy every day.  1 cup is equal to: ? 1 cup milk, yogurt, cottage cheese, or soy milk (soy beverage). ? 2 oz processed cheese. ? 1 oz natural cheese. Fats, oils, salt, and sugars  Only small amounts of oils are recommended.  Avoid foods that are high in calories and low in nutritional value (empty calories), like foods high in fat or added sugars.  Choose foods that are low in salt (sodium). Choose foods that have less than 140 milligrams (mg) of sodium per serving.  Drink water instead of sugary drinks. Drink enough water each day to keep your urine pale yellow. Where to find support  Work with your health care provider or a nutrition specialist (dietitian) to develop a customized eating plan that is right for you.  Download an app (mobile application) to help you track your daily food intake. Where to find more information  Go to [email or web address] for more information.  Learn more and log your daily food intake according to MyPlate using USDA's SuperTracker: www.supertracker.usda.gov Summary  MyPlate is a general guideline for healthy eating from the USDA. It is based on the 2010 Dietary Guidelines for Americans.  In general, fruits and vegetables should take up  of your plate, grains should take up  of your plate, and protein should take up  of your plate. This information is not intended to replace advice given to you by your health care provider. Make sure you discuss any questions you have with your health care provider. Document Released: 05/27/2007 Document Revised: 06/08/2017 Document Reviewed: 08/06/2016 Elsevier Patient Education  [place].

## 2019-05-17 NOTE — Progress Notes (Signed)
Chief Complaint  Patient presents with  . Annual Exam    nonfasting CPE/med check with pelvic. No new concerns.     Julie Fischer is a 58 y.o. female who presents for a complete physical and follow-up on chronic problems. She had labs done prior to visit, see below.  She saw Audelia Acton last month with urinary complaints.  Treated presumptively with macrobid, but culture was negative. She completed the antibiotics, and isn't really having any ongoing symptoms (just occasional soreness).  No longer having any burning or discomfort. There were atrophic changes not at the time of her exam.  Diabetes. She is compliant with metformin and Trulicity (changed from [place] due to yeast infection, and persistent elevations of sugars, in 07/2017). She was started on the lower dose, and kept at that dose due to good control of sugars.  She denies any side effects to her medication. She admits to eating a lot of winterfresh LifeSavers (not buying the sugar-free ones). Sugars have been runninglow 100's in the afternoons, as low as 79 fasting, usually 90.  Only checking about once a week. Denies hypoglycemia, polydipsia, polyuria. Checks feet regularly and has no concerns/lesions/pain.  Last eye exam was 02/2019, no retinopathy.   OSA--doing well on CPAP. Compliant with daily use. Feels refreshed in the mornings, denies daytime somnolence.  Hypertension follow-up: She now has a monitor at home, but hasn't been checking her BP.  Denies dizziness, headaches, chest pain. Denies side effects of medications, no cough. BP Readings from Last 3 Encounters:  04/10/19 130/78  10/31/18 94/62  07/15/18 124/70   Depression: Moods remain well controlled on the paroxetine 1m. Denies side effects of the medication.  In June she reported "I have days I just want to scream", just sporadically, related to virus/quarantine, but was overall doing okay.  Hyperlipidemia follow-up: Patient is reportedly following a low-fat,  low cholesterol diet. Compliant with medications (simvastatin and fish oil) and denies medication side effects.  Osteoporosis: She istolerating Prolia without side effects. Last DEXA was 05/2017, T-2.7 at spine. Changed from fosamax to Prolia after that study.Last injection was in 02/2019.  Insomnia: requires Ambien CR nightly.  Ambien is effective in treating the insomnia--she doesn't sleep at all if she doesn't take it, "the brain won't shut up".Denies unusual behavior (previously would ask husband for food, text people). She recently had trouble getting it through the mail (due to mail delays), went a month without it, and hardly slept (maybe 7 hours/week, falling asleep 4-5 until 7-8), and restless legs were much worse during that time.  She has her medication now, and is sleeping great again.  H/o breast cancer: She is no longer under the care of oncologist (previously sawDr. RTruddie Coco) Last mammogram was 06/2018.She checks her breasts regularly. She has some intermittent tenderness around her scar (chronic, unchanged).   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 Only gets about once a year.  Immunization History  Administered Date(s) Administered  . Influenza Split 02/07/2009, 04/10/2010, 04/19/2011, 01/28/2012  . Influenza,inj,Quad PF,6+ Mos 01/28/2013, 01/28/2014, 02/03/2015, 02/23/2016, 03/13/2017, 02/10/2018, 03/09/2019  . Influenza,inj,quad, With Preservative 02/18/2017  . Pneumococcal Polysaccharide-23 06/04/2001, 11/15/2010, 03/15/2016  . Td 06/04/2001  . Tdap 11/15/2010  . Zoster Recombinat (Shingrix) 04/01/2018, 06/02/2018   Last Pap smear: s/p hysterectomy  Last mammogram: 06/2018 Last colonoscopy: 06/2017, normal; recommended 5 year f/u due to FHx Last DEXA: 05/2017, T-2.7 at spine, statistically significant decrease from prior exam at L hip (T-2.1) Ophtho: yearly, 02/2019 Dentist: twice yearly  Exercise: nothing regular  PMH, PSH, SH and FH reviewed and  updated  Outpatient Encounter Medications as of 05/18/2019  Medication Sig Note  . Alpha-D-Galactosidase (BEANO PO) Take 1 tablet by mouth daily.   . Calcium Carb-Cholecalciferol (CALCIUM 600 + D PO) Take 1 tablet by mouth 2 (two) times daily.   . cetirizine (ZYRTEC) 10 MG tablet Take 10 mg by mouth daily.    . Dulaglutide (TRULICITY) 3.70 WU/8.8BV SOPN Inject 0.75 mg into the skin every 7 (seven) days.   Marland Kitchen lisinopril (ZESTRIL) 10 MG tablet TAKE 1 TABLET DAILY   . metFORMIN (GLUCOPHAGE-XR) 500 MG 24 hr tablet Take 2 tablets (1,000 mg total) by mouth daily.   . Multiple Vitamin (MULTIVITAMIN WITH MINERALS) TABS tablet Take 1 tablet by mouth 2 (two) times daily.   . Multiple Vitamins-Minerals (HAIR SKIN AND NAILS FORMULA PO) Take 1 tablet by mouth daily.   . Omega-3 1000 MG CAPS Take 1,000 mg by mouth 2 (two) times daily.   Marland Kitchen PARoxetine (PAXIL) 40 MG tablet TAKE 1 TABLET AT BEDTIME   . simvastatin (ZOCOR) 20 MG tablet Take 1 tablet (20 mg total) by mouth at bedtime.   Marland Kitchen zolpidem (AMBIEN CR) 12.5 MG CR tablet Take 1 tablet (12.5 mg total) by mouth at bedtime.   Marland Kitchen acetaminophen (TYLENOL) 500 MG tablet Take 1,500 mg every 6 (six) hours as needed by mouth for moderate pain or headache.    Marland Kitchen LYSINE PO Take 1 tablet by mouth daily as needed (cold sore).   Marland Kitchen omeprazole (PRILOSEC OTC) 20 MG tablet Take 1 capsule up to twice daily for the next 1-2 weeks (Patient not taking: Reported on 05/18/2019) 11/03/2018: Uses prn heartburn, once every couple of weeks  . polyethylene glycol powder (GLYCOLAX/MIRALAX) powder Take 17 g by mouth 2 (two) times daily as needed. (Patient not taking: Reported on 11/03/2018)   . simethicone (MYLICON) 694 MG chewable tablet Chew 125 mg every 6 (six) hours as needed by mouth for flatulence.   . valACYclovir (VALTREX) 1000 MG tablet TAKE 2 TABLETS AT ONSET OF COLD SORE AND REPEAT IN 12 HOURS (4 TABLETS PER EPISODE OF COLD SORE) (Patient not taking: Reported on 11/03/2018)   .  [DISCONTINUED] methocarbamol (ROBAXIN) 500 MG tablet Take 1 tablet (500 mg total) by mouth every 6 (six) hours as needed for muscle spasms. (Patient not taking: Reported on 05/18/2019)   . [DISCONTINUED] nitrofurantoin, macrocrystal-monohydrate, (MACROBID) 100 MG capsule Take 1 capsule (100 mg total) by mouth 2 (two) times daily.    Facility-Administered Encounter Medications as of 05/18/2019  Medication  . 0.9 %  sodium chloride infusion   Allergies  Allergen Reactions  . Heparin Other (See Comments)    Low platelets  . Niacin And Related Other (See Comments)    Blotchy/itchy  . Tessalon [Benzonatate] Diarrhea  . Penicillins Rash and Other (See Comments)    Has patient had a PCN reaction causing immediate rash, facial/tongue/throat swelling, SOB or lightheadedness with hypotension: Unknown Has patient had a PCN reaction causing severe rash involving mucus membranes or skin necrosis: Unknown Has patient had a PCN reaction that required hospitalization: No Has patient had a PCN reaction occurring within the last 10 years: No If all of the above answers are "NO", then may proceed with Cephalosporin use.     ROS: The patient denies anorexia,fever, headaches, vision changes, decreased hearing, ear pain, sore throat, chest pain, palpitations, dizziness, syncope, dyspnea on exertion, cough, swelling, nausea, vomiting, constipation, abdominal pain, melena,  hematochezia, hematuria, incontinence, dysuria, vaginal bleeding, discharge, odor or itch, genital lesions, numbness, tingling, weakness, tremor, suspicious skin lesions, abnormal bleeding/bruising, or enlarged lymph nodes.  Chronic insomnia, controlled with nightly ambien. Moods well controlled as per HPI. Bowels are well controlled Still tender to lose things (glasses, keys, remote controls); denies change in the last year or other memory concerns. Bilateral knee pain (stairs, hills, walks too much)  Occasional hip pain. Heartburn every  few weeks, relieved by Prilosec OTC. No skin concerns, saw dermatologist recently (and had something removed from her R side of her face).    PHYSICAL EXAM:  BP 122/72   Pulse 84   Temp 98.6 F (37 C) (Other (Comment))   Ht 4' 9"  (1.448 m)   Wt 153 lb 6.4 oz (69.6 kg)   BMI 33.20 kg/m    Wt Readings from Last 3 Encounters:  05/18/19 153 lb 6.4 oz (69.6 kg)  04/10/19 154 lb 6.4 oz (70 kg)  11/03/18 150 lb (68 kg)    General Appearance:  Alert, cooperative, no distress, appears stated age   Head:  Normocephalic, without obvious abnormality, atraumatic   Eyes:  PERRL, conjunctiva/corneas clear, EOM's intact, fundi benign   Ears:  Normal TM's and external ear canals. Extruded blue PE tube noted in the right canal, unchanged  Nose:  Not examined, wearing mask due to COVID-19 pandemic  Throat:  Not examined, wearing mask due to COVID-19 pandemic  Neck:  Supple, no lymphadenopathy; thyroid: no enlargement/ tenderness/nodules; no carotid bruit or JVD   Back:  Spine nontender, no curvature, ROM normal. 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 No focal mass. No nipple discharge or inversion. L breast exam is entirely normal. No axillary lymphadenopathy.  Abdomen:  Soft, nondistended, normoactive bowel sounds, no masses, no hepatosplenomegaly   Genitalia:  Normal external genitalia without lesions. Mild atrophic changes are noted, no erythema. BUS and vagina normal; Bimanual exam revealed surgically absent uterus. No adnexal masses. No mass, rebound tenderness or guarding   Rectal:  Normal tone, no masses or tenderness; guaiac negative stool    Extremities:  No clubbing, cyanosis or edema. No lesions. Normal diabetic foot exam. WHSS right knee. No effusion or warmth.  Pulses:  2+ and symmetric all extremities   Skin:  Skin color, texture, turgor normal, no rashes or lesions   Lymph nodes:  Cervical, supraclavicular, and axillary nodes normal   Neurologic:  CNII-XII intact, normal strength, sensation and gait; reflexes 2+ and symmetric throughout                 Psych:  Normal mood, affect, hygiene and grooming  PHQ-9 score of 1 (once a month feels bad about herself).  Normal diabetic foot exam    ASSESSMENT/PLAN:   Annual physical exam  Osteoporosis, unspecified osteoporosis type, unspecified pathological fracture presence - Discussed wt bearing exercise; cont Ca, D, Prolia. Due for DEXA - Plan: DG Bone Density  Essential hypertension, benign - controlled - Plan: Comprehensive metabolic panel  Class 1 obesity due to excess calories with serious comorbidity and body mass index (BMI) of 33.0 to 33.9 in adult - counseled re: diet, exercise. 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 cont Trulicity, metformin. encouraged daily exercise and wt loss - Plan: metFORMIN (GLUCOPHAGE-XR) 500 MG 24 hr tablet, HgB A1c, Comprehensive metabolic panel  Medication monitoring encounter - Plan: Comprehensive metabolic panel  Pure hypercholesterolemia - at goal, cont statin - Plan: simvastatin (ZOCOR) 20 MG tablet  Depression, major, in remission (HCC) - Cont Paxil 50m; discussed potential to taper to 258mat some point (after pandemic)   F/u 6 months with labs prior (c-met and A1c)   Discussed monthly self breast exams and yearly mammograms; at least 30 minutes of aerobic activity at  least 5 days/week, weight-bearing exercise at least 2x/wk; proper sunscreen use reviewed; healthy diet, including goals of calcium and vitamin D intake and alcohol recommendations (less than or equal to 1 drink/day) reviewed; regular seatbelt use; changing batteries in smoke detectors. Immunization recommendations discussed--UTD, continue yearly flu shots.  COVID vaccine recommended when available. Colonoscopy recommendations reviewed, UTD.  Due again 06/2022 DEXA due next month (can do in 06/2019 when she has mammo).

## 2019-05-18 ENCOUNTER — Other Ambulatory Visit: Payer: Self-pay

## 2019-05-18 ENCOUNTER — Encounter: Payer: Self-pay | Admitting: Family Medicine

## 2019-05-18 ENCOUNTER — Ambulatory Visit (INDEPENDENT_AMBULATORY_CARE_PROVIDER_SITE_OTHER): Payer: 59 | Admitting: Family Medicine

## 2019-05-18 VITALS — BP 122/72 | HR 84 | Temp 98.6°F | Ht <= 58 in | Wt 153.4 lb

## 2019-05-18 DIAGNOSIS — Z0000 Encounter for general adult medical examination without abnormal findings: Secondary | ICD-10-CM

## 2019-05-18 DIAGNOSIS — M810 Age-related osteoporosis without current pathological fracture: Secondary | ICD-10-CM | POA: Diagnosis not present

## 2019-05-18 DIAGNOSIS — Z9989 Dependence on other enabling machines and devices: Secondary | ICD-10-CM

## 2019-05-18 DIAGNOSIS — E6609 Other obesity due to excess calories: Secondary | ICD-10-CM

## 2019-05-18 DIAGNOSIS — Z6833 Body mass index (BMI) 33.0-33.9, adult: Secondary | ICD-10-CM

## 2019-05-18 DIAGNOSIS — I10 Essential (primary) hypertension: Secondary | ICD-10-CM | POA: Diagnosis not present

## 2019-05-18 DIAGNOSIS — E7800 Pure hypercholesterolemia, unspecified: Secondary | ICD-10-CM

## 2019-05-18 DIAGNOSIS — B001 Herpesviral vesicular dermatitis: Secondary | ICD-10-CM

## 2019-05-18 DIAGNOSIS — E119 Type 2 diabetes mellitus without complications: Secondary | ICD-10-CM

## 2019-05-18 DIAGNOSIS — G4733 Obstructive sleep apnea (adult) (pediatric): Secondary | ICD-10-CM

## 2019-05-18 DIAGNOSIS — Z5181 Encounter for therapeutic drug level monitoring: Secondary | ICD-10-CM

## 2019-05-18 DIAGNOSIS — F325 Major depressive disorder, single episode, in full remission: Secondary | ICD-10-CM

## 2019-05-18 MED ORDER — SIMVASTATIN 20 MG PO TABS: 20.0000 mg | ORAL_TABLET | Freq: Every day | ORAL | 1 refills | Status: DC

## 2019-05-18 MED ORDER — METFORMIN HCL ER 500 MG PO TB24: 1000.0000 mg | ORAL_TABLET | Freq: Every day | ORAL | 1 refills | Status: DC

## 2019-06-05 ENCOUNTER — Ambulatory Visit (INDEPENDENT_AMBULATORY_CARE_PROVIDER_SITE_OTHER): Payer: 59 | Admitting: Family Medicine

## 2019-06-05 ENCOUNTER — Encounter: Payer: Self-pay | Admitting: Family Medicine

## 2019-06-05 VITALS — BP 128/68 | HR 76 | Temp 97.5°F | Wt 155.6 lb

## 2019-06-05 DIAGNOSIS — M62830 Muscle spasm of back: Secondary | ICD-10-CM | POA: Diagnosis not present

## 2019-06-05 MED ORDER — METHOCARBAMOL 500 MG PO TABS: 500.0000 mg | ORAL_TABLET | Freq: Four times a day (QID) | ORAL | 0 refills | Status: DC | PRN

## 2019-06-05 NOTE — Patient Instructions (Signed)
You appear to be having muscle spasms of your back.   Continue taking Tylenol or Aleve or alternating as you are doing.   Use heat or ice several times per day.   You may also want to try a topical pain medication over the counter such as Salon Pas, Biofreeze, Arnicare Gel, etc.   Take the Robaxin (muscle relaxant) as needed for spasm and pain and use caution in case this sedates you.   Follow up if worsening or if not improving by mid next week.

## 2019-06-05 NOTE — Progress Notes (Signed)
   Subjective:    Patient ID: Julie Fischer, female    DOB: 02-15-1961, 59 y.o.   MRN: [id number]  HPI Chief Complaint  Patient presents with  . twisted back    twisted back- lifting some dishes up,hurts to bend, move, sit   Complains of bilateral mid back pain that started yesterday afternoon approx one hour after she was cleaning her cabinets (with her arms raised over her head) and then walking her dog who is a lab-boxer mix. She did not feel pain during these activities.  Occasionally feels like she is having a muscle spasm. Pain does not radiate downward or upward at all. Pain is worse with certain movements and improved with rest.  States pain woke her up last night.    States she took Tylenol 325 mg x 3 and 2 Aleve last night and it did help.  She is also using heat.  Denies any history of back injury or surgery.   Denies fever, chills, dizziness, chest pain, palpitations, shortness of breath, abdominal pain, N/V/D, urinary symptoms.   Reviewed allergies, medications, past medical, surgical, family, and social history.    Review of Systems Pertinent positives and negatives in the history of present illness.     Objective:   Physical Exam Constitutional:      General: She is not in acute distress.    Appearance: Normal appearance.  Cardiovascular:     Rate and Rhythm: Normal rate and regular rhythm.     Pulses: Normal pulses.  Pulmonary:     Effort: Pulmonary effort is normal.     Breath sounds: Normal breath sounds.  Musculoskeletal:     Cervical back: Normal, normal range of motion and neck supple.     Thoracic back: Spasms and tenderness present.     Lumbar back: Normal.     Comments: Thoracic paraspinal muscles with spasm, TTP. No cervical spinal tenderness.   Skin:    General: Skin is warm and dry.     Capillary Refill: Capillary refill takes less than 2 seconds.     Findings: No rash.  Neurological:     Mental Status: She is alert and oriented to  person, place, and time.     Cranial Nerves: Cranial nerves are intact.     Sensory: Sensation is intact.     Motor: Motor function is intact.     Coordination: Coordination is intact.     Gait: Gait is intact.     Deep Tendon Reflexes: Reflexes are normal and symmetric.    BP 128/68   Pulse 76   Temp (!) 97.5 F (36.4 C)   Wt 155 lb 9.6 oz (70.6 kg)   BMI 33.67 kg/m       Assessment & Plan:  Spasm of back muscles - Plan: methocarbamol (ROBAXIN) 500 MG tablet  No red flag symptoms.  She appears to have back spasms.  Will continue with conservative treatment. Prescribed Robaxin.  Encouraged good body mechanics and avoid straining.  Advised to follow up if symptoms are worsening or not improving significantly by mid next week.

## 2019-06-15 ENCOUNTER — Other Ambulatory Visit: Payer: Self-pay | Admitting: Family Medicine

## 2019-06-15 DIAGNOSIS — Z1231 Encounter for screening mammogram for malignant neoplasm of breast: Secondary | ICD-10-CM

## 2019-06-29 ENCOUNTER — Telehealth: Payer: Self-pay | Admitting: *Deleted

## 2019-06-29 NOTE — Telephone Encounter (Signed)
Received a fax from [place] that patient was shipped her zolpidem 12.5mg  on 04/19/19 which was determined to be lost by [employer]. They reshipped on 05/07/19 -the patient has now received both the original and the reshipment for a total of 180 days since Nov 2020.

## 2019-07-07 ENCOUNTER — Encounter: Payer: Self-pay | Admitting: Family Medicine

## 2019-07-11 ENCOUNTER — Other Ambulatory Visit: Payer: Self-pay | Admitting: Family Medicine

## 2019-07-11 DIAGNOSIS — I10 Essential (primary) hypertension: Secondary | ICD-10-CM

## 2019-07-11 DIAGNOSIS — F325 Major depressive disorder, single episode, in full remission: Secondary | ICD-10-CM

## 2019-07-24 ENCOUNTER — Other Ambulatory Visit: Payer: Self-pay

## 2019-07-24 ENCOUNTER — Encounter: Payer: Self-pay | Admitting: Podiatry

## 2019-07-24 ENCOUNTER — Other Ambulatory Visit: Payer: Self-pay | Admitting: Podiatry

## 2019-07-24 ENCOUNTER — Ambulatory Visit (INDEPENDENT_AMBULATORY_CARE_PROVIDER_SITE_OTHER): Payer: 59

## 2019-07-24 ENCOUNTER — Ambulatory Visit (INDEPENDENT_AMBULATORY_CARE_PROVIDER_SITE_OTHER): Payer: 59 | Admitting: Podiatry

## 2019-07-24 DIAGNOSIS — M722 Plantar fascial fibromatosis: Secondary | ICD-10-CM | POA: Diagnosis not present

## 2019-07-24 DIAGNOSIS — M79671 Pain in right foot: Secondary | ICD-10-CM | POA: Diagnosis not present

## 2019-07-24 DIAGNOSIS — M779 Enthesopathy, unspecified: Secondary | ICD-10-CM | POA: Diagnosis not present

## 2019-07-24 NOTE — Patient Instructions (Signed)

## 2019-07-27 NOTE — Progress Notes (Signed)
Subjective:   Patient ID: Julie Fischer, female   DOB: 59 y.o.   MRN: [id number]   HPI Patient presents stating that her right heel has been bothering her recently and she feels like she is walking differently and it is putting a lot of pressure on her heel and arch.  States that she is not had this before and does not remember any injury and states it is been going on for several months.  Patient does not smoke likes to be active   Review of Systems  All other systems reviewed and are negative.       Objective:  Physical Exam Vitals and nursing note reviewed.  Constitutional:      Appearance: She is well-developed.  Pulmonary:     Effort: Pulmonary effort is normal.  Musculoskeletal:        General: Normal range of motion.  Skin:    General: Skin is warm.  Neurological:     Mental Status: She is alert.     Neurovascular status was found to be intact muscle strength was found to be adequate range of motion within normal limits.  Patient is found to have exquisite discomfort plantar aspect right heel and into the arch with inflammation fluid buildup noted and moderate forefoot pain with pain also around the fifth metatarsal head.  Patient is found to have good digital perfusion is well oriented x3     Assessment:  Acute plantar fasciitis right with inflammation fluid buildup along with mid arch pain bilateral secondary to probable compensatory mechanism H&P     Plan:  The condition reviewed and today I went ahead and did a sterile prep of the heel and injected the fascia 3 mg dexamethasone Kenalog 5 mg Xylocaine I applied fascial brace to lift up the arch to take pressure off the arch and I instructed on reduced activity.  Reappoint to recheck  X-rays indicate that there is a small spur there is no indication stress fracture arthritis

## 2019-08-13 ENCOUNTER — Ambulatory Visit: Payer: 59 | Attending: Internal Medicine

## 2019-08-13 DIAGNOSIS — Z23 Encounter for immunization: Secondary | ICD-10-CM

## 2019-08-13 NOTE — Progress Notes (Signed)
   Covid-19 Vaccination Clinic  Name:  Julie Fischer    MRN: [id number] DOB: Sep 17, 1960  08/13/2019  [name] was observed post Covid-19 immunization for 15 minutes without incident. She was provided with Vaccine Information Sheet and instruction to access the V-Safe system.   [name] was instructed to call 911 with any severe reactions post vaccine: Marland Kitchen Difficulty breathing  . Swelling of face and throat  . A fast heartbeat  . A bad rash all over body  . Dizziness and weakness   Immunizations Administered    Name Date Dose VIS Date Route   Pfizer COVID-19 Vaccine 08/13/2019 11:11 AM 0.3 mL 05/01/2019 Intramuscular   Manufacturer: [place]   Lot: [id number]   [place]: [id number]

## 2019-08-14 ENCOUNTER — Other Ambulatory Visit: Payer: Self-pay

## 2019-08-14 ENCOUNTER — Encounter: Payer: Self-pay | Admitting: Podiatry

## 2019-08-14 ENCOUNTER — Ambulatory Visit (INDEPENDENT_AMBULATORY_CARE_PROVIDER_SITE_OTHER): Payer: 59 | Admitting: Podiatry

## 2019-08-14 DIAGNOSIS — M722 Plantar fascial fibromatosis: Secondary | ICD-10-CM | POA: Diagnosis not present

## 2019-08-14 NOTE — Progress Notes (Signed)
Subjective:   Patient ID: Julie Fischer, female   DOB: 59 y.o.   MRN: [id number]   HPI Patient states the right heel has been somewhat improved but I still get problems with it and I need to be active secondary to my health.  Patient also has an unusual shape foot and a small foot which is very difficult to fit neuro   ROS      Objective:  Physical Exam  Vascular status intact with patient noted to have continued inflammation plantar aspect right heel improved some but still painful with flexion     Assessment:  Fasciitis-like symptoms right still present     Plan:  H&P reviewed condition reviewed that I would like to not do further injections and I recommended long-term orthotics to try to take pressure off the feet and she is casted for functional orthotic devices

## 2019-08-17 ENCOUNTER — Other Ambulatory Visit: Payer: 59

## 2019-08-17 ENCOUNTER — Ambulatory Visit: Payer: 59

## 2019-08-27 ENCOUNTER — Other Ambulatory Visit: Payer: Self-pay

## 2019-08-27 ENCOUNTER — Ambulatory Visit: Payer: 59 | Admitting: Orthotics

## 2019-08-27 DIAGNOSIS — M722 Plantar fascial fibromatosis: Secondary | ICD-10-CM

## 2019-08-27 NOTE — Progress Notes (Signed)
F/o not in..rescheduled for later date.

## 2019-09-01 ENCOUNTER — Other Ambulatory Visit: Payer: Self-pay

## 2019-09-01 ENCOUNTER — Ambulatory Visit: Payer: 59 | Admitting: Orthotics

## 2019-09-01 DIAGNOSIS — M79671 Pain in right foot: Secondary | ICD-10-CM

## 2019-09-01 DIAGNOSIS — M722 Plantar fascial fibromatosis: Secondary | ICD-10-CM

## 2019-09-01 NOTE — Progress Notes (Signed)
Patient came in today to pick up custom made foot orthotics.  The goals were accomplished and the patient reported no dissatisfaction with said orthotics.  Patient was advised of breakin period and how to report any issues. 

## 2019-09-07 ENCOUNTER — Ambulatory Visit: Payer: 59 | Attending: Internal Medicine

## 2019-09-07 DIAGNOSIS — Z23 Encounter for immunization: Secondary | ICD-10-CM

## 2019-09-07 NOTE — Progress Notes (Signed)
   Covid-19 Vaccination Clinic  Name:  DEVANEE MAI    MRN: [id number] DOB: 1960/12/15  09/07/2019  Ms. Narez was observed post Covid-19 immunization for 15 minutes without incident. She was provided with Vaccine Information Sheet and instruction to access the V-Safe system.   Ms. Lerman was instructed to call 911 with any severe reactions post vaccine: Marland Kitchen Difficulty breathing  . Swelling of face and throat  . A fast heartbeat  . A bad rash all over body  . Dizziness and weakness   Immunizations Administered    Name Date Dose VIS Date Route   Pfizer COVID-19 Vaccine 09/07/2019 10:28 AM 0.3 mL 07/15/2018 Intramuscular   Manufacturer: [place]   Lot: [id number]   [place]: [id number]

## 2019-09-08 ENCOUNTER — Encounter: Payer: Self-pay | Admitting: Family Medicine

## 2019-09-08 DIAGNOSIS — G4700 Insomnia, unspecified: Secondary | ICD-10-CM

## 2019-09-09 ENCOUNTER — Other Ambulatory Visit: Payer: Self-pay | Admitting: *Deleted

## 2019-09-09 DIAGNOSIS — E119 Type 2 diabetes mellitus without complications: Secondary | ICD-10-CM

## 2019-09-09 MED ORDER — TRULICITY 0.75 MG/0.5ML ~~LOC~~ SOAJ: 0.7500 mg | SUBCUTANEOUS | 0 refills | Status: DC

## 2019-09-09 MED ORDER — ZOLPIDEM TARTRATE ER 12.5 MG PO TBCR: 12.5000 mg | EXTENDED_RELEASE_TABLET | Freq: Every day | ORAL | 0 refills | Status: DC

## 2019-09-11 ENCOUNTER — Other Ambulatory Visit: Payer: 59

## 2019-09-16 ENCOUNTER — Other Ambulatory Visit: Payer: Self-pay

## 2019-09-16 ENCOUNTER — Other Ambulatory Visit: Payer: 59

## 2019-09-16 DIAGNOSIS — M810 Age-related osteoporosis without current pathological fracture: Secondary | ICD-10-CM

## 2019-09-16 MED ORDER — DENOSUMAB 60 MG/ML ~~LOC~~ SOSY
60.0000 mg | PREFILLED_SYRINGE | Freq: Once | SUBCUTANEOUS | Status: AC
  Administered 2019-09-16: 60 mg via SUBCUTANEOUS

## 2019-10-02 ENCOUNTER — Other Ambulatory Visit: Payer: Self-pay | Admitting: Family Medicine

## 2019-10-02 DIAGNOSIS — I10 Essential (primary) hypertension: Secondary | ICD-10-CM

## 2019-10-28 ENCOUNTER — Other Ambulatory Visit: Payer: Self-pay | Admitting: Family Medicine

## 2019-10-28 DIAGNOSIS — F325 Major depressive disorder, single episode, in full remission: Secondary | ICD-10-CM

## 2019-11-11 ENCOUNTER — Other Ambulatory Visit: Payer: 59

## 2019-11-11 ENCOUNTER — Other Ambulatory Visit: Payer: Self-pay | Admitting: Family Medicine

## 2019-11-11 ENCOUNTER — Other Ambulatory Visit: Payer: Self-pay

## 2019-11-11 DIAGNOSIS — Z5181 Encounter for therapeutic drug level monitoring: Secondary | ICD-10-CM

## 2019-11-11 DIAGNOSIS — I10 Essential (primary) hypertension: Secondary | ICD-10-CM

## 2019-11-11 DIAGNOSIS — E7800 Pure hypercholesterolemia, unspecified: Secondary | ICD-10-CM

## 2019-11-11 DIAGNOSIS — E119 Type 2 diabetes mellitus without complications: Secondary | ICD-10-CM

## 2019-11-11 NOTE — Telephone Encounter (Signed)
Pt can wait until her appt next week with Dr. Tomi Bamberger to get refill?

## 2019-11-11 NOTE — Telephone Encounter (Signed)
Has an appt today for labs. Will ask her

## 2019-11-12 LAB — HEMOGLOBIN A1C
Est. average glucose Bld gHb Est-mCnc: 137 mg/dL
Hgb A1c MFr Bld: 6.4 % — ABNORMAL HIGH (ref 4.8–5.6)

## 2019-11-12 LAB — COMPREHENSIVE METABOLIC PANEL
ALT: 23 IU/L (ref 0–32)
AST: 22 IU/L (ref 0–40)
Albumin/Globulin Ratio: 1.9 (ref 1.2–2.2)
Albumin: 4.2 g/dL (ref 3.8–4.8)
Alkaline Phosphatase: 51 IU/L (ref 48–121)
BUN/Creatinine Ratio: 20 (ref 12–28)
BUN: 13 mg/dL (ref 8–27)
Bilirubin Total: 0.2 mg/dL (ref 0.0–1.2)
CO2: 24 mmol/L (ref 20–29)
Calcium: 9.4 mg/dL (ref 8.7–10.3)
Chloride: 103 mmol/L (ref 96–106)
Creatinine, Ser: 0.64 mg/dL (ref 0.57–1.00)
GFR calc Af Amer: 111 mL/min/{1.73_m2} (ref >59)
GFR calc non Af Amer: 97 mL/min/{1.73_m2} (ref >59)
Globulin, Total: 2.2 g/dL (ref 1.5–4.5)
Glucose: 114 mg/dL — ABNORMAL HIGH (ref 65–99)
Potassium: 4.6 mmol/L (ref 3.5–5.2)
Sodium: 138 mmol/L (ref 134–144)
Total Protein: 6.4 g/dL (ref 6.0–8.5)

## 2019-11-15 NOTE — Progress Notes (Signed)
Chief Complaint  Patient presents with  . Diabetes    nonfasting med check. No complaints.    Patient presents for 6 month med check. She had labs done prior to her visit, see below.  Diabetes. She is compliant with metformin and Trulicity (changed from [place] due to yeast infection, and persistent elevations of sugars, in 07/2017). She was started on the lower dose, and kept at that dose due to good control of sugars.  She denies any side effects to her medication. Sugars have been running 87-146, mostly 110-120. Denies hypoglycemia, polydipsia, polyuria. Checks feet regularly and has no concerns/lesions/pain.  Last eye exam was 02/2019, no retinopathy.  OSA--doing well on CPAP. Compliant with daily use. Feels refreshed in the mornings, denies daytime somnolence.  Hypertension follow-up: BP's aren't checked routinely, cannot recall values. Denies dizziness, headaches, chest pain. Denies side effects of medications, no cough.  Depression: Moods remain well controlled on the paroxetine 31m.Denies side effects of the medication. She is interested in potentially cutting back the dose. Denies any anxiety, depression, has been doing well for quite a while. Her husband will be retiring, and [place] much of the [place]  Hyperlipidemia follow-up: Patient is reportedly following a low-fat, low cholesterol diet. Compliant with medications (simvastatin and fish oil) and denies medication side effects.Lipids were at goal on last check. Lab Results  Component Value Date   CHOL 142 05/11/2019   HDL 47 05/11/2019   LDLCALC 70 05/11/2019   TRIG 144 05/11/2019   CHOLHDL 3.0 05/11/2019   Osteoporosis: She istolerating Prolia without side effects. Last DEXA was 05/2017, T-2.7 at spine. Changed from fosamax to Prolia after that study. She is scheduled for a f/u DEXA scan on 6/29. Last injection was 09/16/2019.  Insomnia: requires Ambien CR nightly. Ambien is effective in  treating the insomnia--she doesn't sleep at all if she doesn't take it, "the brain won't shut up", and restless legs were much worse (last occurred when had issues with mail order supply getting lost/delayed).Denies unusual behavior (previously would ask husband for food, text people). This was last filled 4/21 for 90d supply.  H/o breast cancer: She is no longer under the care of oncologist (previously sawDr. RTruddie Coco) Last mammogram was 06/2018, scheduled for another on 11/17/19.She checks her breasts regularly. She has some intermittent tenderness around her scar (chronic, unchanged).   Plantar fasciitis--she last had injection into R PF in 07/2019, and then she got orthotics. She has noticed improvement in her feet, hips and knees since using the orthotics daily.   PMH, PSH, SH reviewed  Outpatient Encounter Medications as of 11/16/2019  Medication Sig Note  . acetaminophen (TYLENOL) 500 MG tablet Take 1,500 mg every 6 (six) hours as needed by mouth for moderate pain or headache.  11/16/2019: Takes in the evenings (recent dentalwork, knee pain)  . Alpha-D-Galactosidase (BEANO PO) Take 1 tablet by mouth daily.   . Calcium Carb-Cholecalciferol (CALCIUM 600 + D PO) Take 1 tablet by mouth 2 (two) times daily.   . Dulaglutide (TRULICITY) 00.09MFG/1.8EXSOPN Inject 0.75 mg into the skin every 7 (seven) days.   .Marland Kitchenlisinopril (ZESTRIL) 10 MG tablet TAKE 1 TABLET DAILY   . metFORMIN (GLUCOPHAGE-XR) 500 MG 24 hr tablet Take 2 tablets (1,000 mg total) by mouth daily.   . Multiple Vitamin (MULTIVITAMIN WITH MINERALS) TABS tablet Take 1 tablet by mouth 2 (two) times daily.   . Multiple Vitamins-Minerals (HAIR SKIN AND NAILS FORMULA PO) Take 1 tablet by  mouth daily.   . Omega-3 1000 MG CAPS Take 1,000 mg by mouth 2 (two) times daily.   Marland Kitchen PARoxetine (PAXIL) 40 MG tablet TAKE 1 TABLET AT BEDTIME   . simvastatin (ZOCOR) 20 MG tablet Take 1 tablet (20 mg total) by mouth at bedtime.   Marland Kitchen zolpidem (AMBIEN CR)  12.5 MG CR tablet Take 1 tablet (12.5 mg total) by mouth at bedtime.   . cetirizine (ZYRTEC) 10 MG tablet Take 10 mg by mouth daily.  (Patient not taking: Reported on 11/16/2019)   . LYSINE PO Take 1 tablet by mouth daily as needed (cold sore). (Patient not taking: Reported on 11/16/2019)   . methocarbamol (ROBAXIN) 500 MG tablet Take 1 tablet (500 mg total) by mouth every 6 (six) hours as needed for muscle spasms. (Patient not taking: Reported on 11/16/2019)   . omeprazole (PRILOSEC OTC) 20 MG tablet Take 1 capsule up to twice daily for the next 1-2 weeks (Patient not taking: Reported on 11/16/2019) 11/03/2018: Uses prn heartburn, once every couple of weeks  . simethicone (MYLICON) 604 MG chewable tablet Chew 125 mg every 6 (six) hours as needed by mouth for flatulence. (Patient not taking: Reported on 11/16/2019)   . [DISCONTINUED] methylPREDNISolone (MEDROL DOSEPAK) 4 MG TBPK tablet See admin instructions.    Facility-Administered Encounter Medications as of 11/16/2019  Medication  . 0.9 %  sodium chloride infusion   Allergies  Allergen Reactions  . Heparin Other (See Comments)    Low platelets  . Niacin And Related Other (See Comments)    Blotchy/itchy  . Tessalon [Benzonatate] Diarrhea  . Penicillins Rash and Other (See Comments)    Has patient had a PCN reaction causing immediate rash, facial/tongue/throat swelling, SOB or lightheadedness with hypotension: Unknown Has patient had a PCN reaction causing severe rash involving mucus membranes or skin necrosis: Unknown Has patient had a PCN reaction that required hospitalization: No Has patient had a PCN reaction occurring within the last 10 years: No If all of the above answers are "NO", then may proceed with Cephalosporin use.     ROS:  No fever, chills, URI symptoms, headaches, dizziness, chest pain, shortness of breath.  Denies GI or GU complaints, bleeding, bruising, rashes, breast concerns. Moods are stable Joint pain--some knee pain  (mild, relieved by Tylenol). Feet are better with orthotics. Insomnia is well controlled with Ambien CR   PHYSICAL EXAM:  BP 128/80   Pulse 80   Ht 4' 9" (1.448 m)   Wt 149 lb 9.6 oz (67.9 kg)   BMI 32.37 kg/m   Wt Readings from Last 3 Encounters:  11/16/19 149 lb 9.6 oz (67.9 kg)  06/05/19 155 lb 9.6 oz (70.6 kg)  05/18/19 153 lb 6.4 oz (69.6 kg)   Well appearing, pleasant, obese female in no distress HEENT: conjunctiva and sclera are clear, EOMI, wearing mask Neck: no lymphadenopathy, thyromegaly, carotid bruit or mass Heart: regular rate and rhythm Lungs: clear bilaterally Back: no spinal or CVA tenderness Abdomen: soft, obese, nontender, no mass Extremities: no edema, 2+ pules Psych: normal mood, affect, hygiene and grooming Neuro: alert and oriented, normal gait  Lab Results  Component Value Date   HGBA1C 6.4 (H) 11/11/2019   Fasting glucose 114   Chemistry      Component Value Date/Time   NA 138 11/11/2019 0913   K 4.6 11/11/2019 0913   CL 103 11/11/2019 0913   CO2 24 11/11/2019 0913   BUN 13 11/11/2019 0913   CREATININE 0.64 11/11/2019  0913   CREATININE 0.55 03/18/2017 1006      Component Value Date/Time   CALCIUM 9.4 11/11/2019 0913   ALKPHOS 51 11/11/2019 0913   AST 22 11/11/2019 0913   ALT 23 11/11/2019 0913   BILITOT 0.2 11/11/2019 0913      ASSESSMENT/PLAN:  Essential hypertension, benign - well controlled on current regimen. Daily exercise and further wt loss recommended, low Na diet  Osteoporosis, unspecified osteoporosis type, unspecified pathological fracture presence - has DEXA scheduled for tomorrow. Cont Prolia, Ca, D, and weight-bearing exercise  OSA on CPAP  Class 1 obesity due to excess calories with serious comorbidity and body mass index (BMI) of 33.0 to 33.9 in adult - comorbidities--OSA, HTN, DM  Hypertension associated with diabetes ([place])  Type 2 diabetes mellitus without complication, without long-term current use of  insulin ([place]) - controlled. cont Trulicity, metformin. encouraged daily exercise and wt loss - Plan: metFORMIN (GLUCOPHAGE-XR) 500 MG 24 hr tablet, Dulaglutide (TRULICITY) 0.17 PZ/0.2HE SOPN  Pure hypercholesterolemia - at goal on simvastatin, continue - Plan: simvastatin (ZOCOR) 20 MG tablet  Depression, major, in remission ([place]) - Discussed tapering Paxil down to 79m. She is interested in trying this. Resume 463mif recurrent depressive sx develop - Plan: PARoxetine (PAXIL) 20 MG tablet   Needs to schedule physical--husband retiring, she will have new insurance, briefly discussed this. Labs prior to CPE-- A1c, c-met, lipids, CBC, TSH, urine microalb

## 2019-11-16 ENCOUNTER — Encounter: Payer: Self-pay | Admitting: Family Medicine

## 2019-11-16 ENCOUNTER — Other Ambulatory Visit: Payer: Self-pay

## 2019-11-16 ENCOUNTER — Ambulatory Visit (INDEPENDENT_AMBULATORY_CARE_PROVIDER_SITE_OTHER): Payer: 59 | Admitting: Family Medicine

## 2019-11-16 VITALS — BP 128/80 | HR 80 | Ht <= 58 in | Wt 149.6 lb

## 2019-11-16 DIAGNOSIS — Z6833 Body mass index (BMI) 33.0-33.9, adult: Secondary | ICD-10-CM

## 2019-11-16 DIAGNOSIS — Z0000 Encounter for general adult medical examination without abnormal findings: Secondary | ICD-10-CM

## 2019-11-16 DIAGNOSIS — E119 Type 2 diabetes mellitus without complications: Secondary | ICD-10-CM

## 2019-11-16 DIAGNOSIS — Z5181 Encounter for therapeutic drug level monitoring: Secondary | ICD-10-CM

## 2019-11-16 DIAGNOSIS — E6609 Other obesity due to excess calories: Secondary | ICD-10-CM

## 2019-11-16 DIAGNOSIS — G4733 Obstructive sleep apnea (adult) (pediatric): Secondary | ICD-10-CM | POA: Diagnosis not present

## 2019-11-16 DIAGNOSIS — M810 Age-related osteoporosis without current pathological fracture: Secondary | ICD-10-CM | POA: Diagnosis not present

## 2019-11-16 DIAGNOSIS — E7800 Pure hypercholesterolemia, unspecified: Secondary | ICD-10-CM

## 2019-11-16 DIAGNOSIS — I10 Essential (primary) hypertension: Secondary | ICD-10-CM | POA: Diagnosis not present

## 2019-11-16 DIAGNOSIS — F325 Major depressive disorder, single episode, in full remission: Secondary | ICD-10-CM

## 2019-11-16 DIAGNOSIS — Z9989 Dependence on other enabling machines and devices: Secondary | ICD-10-CM

## 2019-11-16 DIAGNOSIS — E1159 Type 2 diabetes mellitus with other circulatory complications: Secondary | ICD-10-CM

## 2019-11-16 MED ORDER — PAROXETINE HCL 20 MG PO TABS: ORAL_TABLET | ORAL | 1 refills | Status: DC

## 2019-11-16 MED ORDER — METFORMIN HCL ER 500 MG PO TB24: 1000.0000 mg | ORAL_TABLET | Freq: Every day | ORAL | 1 refills | Status: DC

## 2019-11-16 MED ORDER — SIMVASTATIN 20 MG PO TABS: 20.0000 mg | ORAL_TABLET | Freq: Every day | ORAL | 1 refills | Status: DC

## 2019-11-16 MED ORDER — TRULICITY 0.75 MG/0.5ML ~~LOC~~ SOAJ: 0.7500 mg | SUBCUTANEOUS | 1 refills | Status: DC

## 2019-11-16 NOTE — Patient Instructions (Addendum)
Periodically check your blood pressure. Goal is 130/80 or less.  If you are having it consistently above this, be sure to work on exercise, low sodium diet, and if stays above goal, come in for re-assessment.  We are going to try tapering down the paxil to 20mg  daily.  Take 30mg  for a week as a step-down (1.5 tablets of the 20mg  dose), then further decrease to 20mg  daily.  If at any time you have any recurrent depression or anxiety, increase the dose back up.

## 2019-11-17 ENCOUNTER — Ambulatory Visit
Admission: RE | Admit: 2019-11-17 | Discharge: 2019-11-17 | Disposition: A | Discharge status: Home / Self Care | Payer: 59 | Source: Ambulatory Visit | Attending: Family Medicine | Admitting: Family Medicine

## 2019-11-17 DIAGNOSIS — Z1231 Encounter for screening mammogram for malignant neoplasm of breast: Secondary | ICD-10-CM

## 2019-11-17 DIAGNOSIS — M810 Age-related osteoporosis without current pathological fracture: Secondary | ICD-10-CM

## 2019-12-07 ENCOUNTER — Encounter: Payer: Self-pay | Admitting: Family Medicine

## 2019-12-08 ENCOUNTER — Other Ambulatory Visit: Payer: Self-pay

## 2019-12-08 DIAGNOSIS — E119 Type 2 diabetes mellitus without complications: Secondary | ICD-10-CM

## 2019-12-08 MED ORDER — TRULICITY 0.75 MG/0.5ML ~~LOC~~ SOAJ: 0.7500 mg | SUBCUTANEOUS | 1 refills | Status: DC

## 2019-12-24 ENCOUNTER — Other Ambulatory Visit: Payer: Self-pay | Admitting: Family Medicine

## 2019-12-24 DIAGNOSIS — I10 Essential (primary) hypertension: Secondary | ICD-10-CM

## 2020-01-08 ENCOUNTER — Telehealth: Payer: Self-pay | Admitting: Family Medicine

## 2020-01-08 NOTE — Telephone Encounter (Signed)
P.A. TRULICITY 

## 2020-01-10 ENCOUNTER — Encounter: Payer: Self-pay | Admitting: Family Medicine

## 2020-01-11 NOTE — Telephone Encounter (Signed)
Recv'd fax back from [facility] stating they do not handle pharmacy benefits for this plan, even though that was who was given by original fax from pharmacy.  (We do not actually have pt's new insurance card.) So I called pharmacy & got plan info and is BCBS and redid thru Cover My meds and was approved

## 2020-01-14 NOTE — Telephone Encounter (Signed)
Left message for pt

## 2020-02-02 ENCOUNTER — Other Ambulatory Visit: Payer: Self-pay | Admitting: Family Medicine

## 2020-02-02 DIAGNOSIS — G4700 Insomnia, unspecified: Secondary | ICD-10-CM

## 2020-02-02 NOTE — Telephone Encounter (Signed)
Is this okay to refill? 

## 2020-02-19 ENCOUNTER — Encounter: Payer: Self-pay | Admitting: Family Medicine

## 2020-02-19 DIAGNOSIS — I10 Essential (primary) hypertension: Secondary | ICD-10-CM

## 2020-02-21 MED ORDER — LISINOPRIL 10 MG PO TABS: 10.0000 mg | ORAL_TABLET | Freq: Every day | ORAL | 1 refills | Status: DC

## 2020-03-23 DIAGNOSIS — H04123 Dry eye syndrome of bilateral lacrimal glands: Secondary | ICD-10-CM | POA: Diagnosis not present

## 2020-03-23 DIAGNOSIS — E119 Type 2 diabetes mellitus without complications: Secondary | ICD-10-CM | POA: Diagnosis not present

## 2020-03-23 DIAGNOSIS — H43811 Vitreous degeneration, right eye: Secondary | ICD-10-CM | POA: Diagnosis not present

## 2020-03-23 DIAGNOSIS — H2513 Age-related nuclear cataract, bilateral: Secondary | ICD-10-CM | POA: Diagnosis not present

## 2020-03-25 LAB — HM DIABETES EYE EXAM

## 2020-03-29 ENCOUNTER — Other Ambulatory Visit: Payer: BC Managed Care – PPO

## 2020-03-29 ENCOUNTER — Telehealth: Payer: Self-pay | Admitting: Internal Medicine

## 2020-03-29 ENCOUNTER — Other Ambulatory Visit: Payer: Self-pay

## 2020-03-29 ENCOUNTER — Other Ambulatory Visit (INDEPENDENT_AMBULATORY_CARE_PROVIDER_SITE_OTHER): Payer: BC Managed Care – PPO

## 2020-03-29 DIAGNOSIS — Z23 Encounter for immunization: Secondary | ICD-10-CM | POA: Diagnosis not present

## 2020-03-29 NOTE — Telephone Encounter (Signed)
Pt was here today for a flu shot and asked about getting her covid vaccine. Please advise if ok. I advised her she has to wait 2 weeks if Dr. Tomi Bamberger approves it since she got flu shot today

## 2020-03-29 NOTE — Telephone Encounter (Signed)
Yes, she has risk factors for complications.  Ok to schedule NV for booster in 2 weeks

## 2020-03-29 NOTE — Telephone Encounter (Signed)
Pt scheduled  

## 2020-04-04 ENCOUNTER — Encounter: Payer: Self-pay | Admitting: *Deleted

## 2020-04-05 ENCOUNTER — Encounter: Payer: Self-pay | Admitting: Family Medicine

## 2020-04-07 ENCOUNTER — Other Ambulatory Visit: Payer: BC Managed Care – PPO

## 2020-04-08 ENCOUNTER — Other Ambulatory Visit: Payer: BC Managed Care – PPO

## 2020-04-11 ENCOUNTER — Other Ambulatory Visit: Payer: Self-pay | Admitting: Family Medicine

## 2020-04-11 DIAGNOSIS — G4700 Insomnia, unspecified: Secondary | ICD-10-CM

## 2020-04-11 NOTE — Telephone Encounter (Signed)
She usually gets Paxil and Simvastatin from the mail order.  Last filled x 6 mos the end of June (so has just over a month left)--okay to fill these x 90d now (early), since is mail order, if she wants.  The [facility], not [facility], and was prescribed for #90 on 9/14.  Should have enough until 12/14, a little earlier to refill for a local pharmacy, unless she is traveling or has a reason for earlier fill.    Please check and let me know.

## 2020-04-11 NOTE — Telephone Encounter (Signed)
Called pt. And she said that she needs all of the medications to [facility] now on [address]. She did say she is not out of the Zolpidem yet she just figured she would request a refill on all of them at the same time so she didn't forget.

## 2020-04-11 NOTE — Telephone Encounter (Signed)
Pt. Requesting refill on Zolpidem, paxil, and simvastatin pt. Last apt was 11/16/19 and next apt is 06/09/20.

## 2020-04-11 NOTE — Telephone Encounter (Signed)
She should have over a month left of the ones she had been getting from mail order.  It will be too soon for her to get any of these 3 medications if getting locally.  Please look at the last prescriptions sent (6/28 for 90 with 1 RF and 9/14 for #90 of ambien) and let me know what I'm missing for these to be needed now, versus mid December. Is she traveling??

## 2020-04-13 ENCOUNTER — Other Ambulatory Visit: Payer: Self-pay

## 2020-04-13 ENCOUNTER — Ambulatory Visit (INDEPENDENT_AMBULATORY_CARE_PROVIDER_SITE_OTHER): Payer: BC Managed Care – PPO

## 2020-04-13 DIAGNOSIS — Z23 Encounter for immunization: Secondary | ICD-10-CM

## 2020-04-18 ENCOUNTER — Other Ambulatory Visit: Payer: Self-pay | Admitting: *Deleted

## 2020-04-18 ENCOUNTER — Encounter: Payer: Self-pay | Admitting: Family Medicine

## 2020-04-18 DIAGNOSIS — F325 Major depressive disorder, single episode, in full remission: Secondary | ICD-10-CM

## 2020-04-18 DIAGNOSIS — E119 Type 2 diabetes mellitus without complications: Secondary | ICD-10-CM

## 2020-04-18 DIAGNOSIS — E7800 Pure hypercholesterolemia, unspecified: Secondary | ICD-10-CM

## 2020-04-18 MED ORDER — PAROXETINE HCL 20 MG PO TABS: ORAL_TABLET | ORAL | 0 refills | Status: DC

## 2020-04-18 MED ORDER — TRULICITY 0.75 MG/0.5ML ~~LOC~~ SOAJ: 0.7500 mg | SUBCUTANEOUS | 0 refills | Status: DC

## 2020-04-18 MED ORDER — SIMVASTATIN 20 MG PO TABS: 20.0000 mg | ORAL_TABLET | Freq: Every day | ORAL | 0 refills | Status: DC

## 2020-04-18 MED ORDER — ZOLPIDEM TARTRATE ER 12.5 MG PO TBCR: 12.5000 mg | EXTENDED_RELEASE_TABLET | Freq: Every day | ORAL | 0 refills | Status: DC

## 2020-04-18 NOTE — Telephone Encounter (Signed)
See message I sent to Adam last week.  Not sure that it was addressed, no further documentation on it.  Last filled #90 of ambien on 9/14, so now should have 2 weeks left (and we used to fill very early due to it being mail order, but now it is local; she shouldn't be out). See prior message and see what is up (will they be traveling, so need it filled early?)

## 2020-04-18 NOTE — Telephone Encounter (Signed)
Is this okay to refill? 

## 2020-04-18 NOTE — Telephone Encounter (Signed)
You are correct-she has 2 weeks left and she will call pharmacy when she is ready for the refill.

## 2020-04-26 ENCOUNTER — Other Ambulatory Visit: Payer: Self-pay

## 2020-04-26 ENCOUNTER — Other Ambulatory Visit (INDEPENDENT_AMBULATORY_CARE_PROVIDER_SITE_OTHER): Payer: BC Managed Care – PPO

## 2020-04-26 DIAGNOSIS — M810 Age-related osteoporosis without current pathological fracture: Secondary | ICD-10-CM | POA: Diagnosis not present

## 2020-04-26 MED ORDER — DENOSUMAB 60 MG/ML ~~LOC~~ SOSY
60.0000 mg | PREFILLED_SYRINGE | Freq: Once | SUBCUTANEOUS | Status: AC
  Administered 2020-04-26: 60 mg via SUBCUTANEOUS

## 2020-04-30 ENCOUNTER — Encounter: Payer: Self-pay | Admitting: Family Medicine

## 2020-04-30 DIAGNOSIS — E119 Type 2 diabetes mellitus without complications: Secondary | ICD-10-CM

## 2020-05-02 MED ORDER — METFORMIN HCL ER 500 MG PO TB24: 1000.0000 mg | ORAL_TABLET | Freq: Every day | ORAL | 1 refills | Status: DC

## 2020-05-25 ENCOUNTER — Encounter: Payer: Self-pay | Admitting: Medical

## 2020-05-25 ENCOUNTER — Telehealth (INDEPENDENT_AMBULATORY_CARE_PROVIDER_SITE_OTHER): Payer: 59 | Admitting: Medical

## 2020-05-25 ENCOUNTER — Other Ambulatory Visit: Payer: Self-pay

## 2020-05-25 ENCOUNTER — Ambulatory Visit
Admission: RE | Admit: 2020-05-25 | Discharge: 2020-05-25 | Disposition: A | Discharge status: Home / Self Care | Payer: 59 | Source: Ambulatory Visit | Attending: Medical | Admitting: Medical

## 2020-05-25 ENCOUNTER — Other Ambulatory Visit (INDEPENDENT_AMBULATORY_CARE_PROVIDER_SITE_OTHER): Payer: 59

## 2020-05-25 VITALS — Ht <= 58 in | Wt 148.0 lb

## 2020-05-25 DIAGNOSIS — R059 Cough, unspecified: Secondary | ICD-10-CM

## 2020-05-25 DIAGNOSIS — W19XXXA Unspecified fall, initial encounter: Secondary | ICD-10-CM | POA: Diagnosis not present

## 2020-05-25 DIAGNOSIS — M25551 Pain in right hip: Secondary | ICD-10-CM

## 2020-05-25 LAB — POC COVID19 BINAXNOW: SARS Coronavirus 2 Ag: NEGATIVE

## 2020-05-25 NOTE — Progress Notes (Signed)
Subjective:  Julie Fischer is a 60 y.o. female who presents for Chief Complaint  Patient presents with  . Hip Pain    Due to fall 1 week before christmas      This visit type was conducted due to national recommendations for restrictions regarding the COVID-19 Pandemic (e.g. social distancing) in an effort to limit this patient's exposure and mitigate transmission in our community.  Due to their co-morbid illnesses, this patient is at least at moderate risk for complications without adequate follow up.  This format is felt to be most appropriate for this patient at this time.    Documentation for virtual audio and video telecommunications through [facility] encounter:  The patient was located at home. The provider was located in the office. The patient did consent to this visit and is aware of possible charges through their insurance for this visit.  The other persons participating in this telemedicine service were none. Time spent on call was 20 minutes and in review of previous records >22minutes total.  This virtual service is not related to other E/M service within previous 7 days.    She notes having a fall the week before Christmas around December 22 date of injury  They are putting new flooring down in her house.  She tripped on one of the boxes of flooring landing on her right hip.  She denies any bruising or swelling.  She has been able to ambulate even after the fall.  She does have pain lying on the right side and pain going up and down stairs.  Says the pain has lingered this long she wanted to be evaluated  She denies numbness, tingling, weakness.  It does hurt to bear weight in the right hip.  She denies knee pain, ankle pain, back pain.  No head injury no loss of consciousness.  Pain can be 6 out of 10.  She has a history of osteoporosis on Prolia, she has had bilateral knee replacements, she has had a shoulder fracture years ago as an adult  She was scheduled to be  in person visit today but on screening was noted to have a cough.  She notes about 5 to 6-day history of a mild "cold" with cough, head congestion, but no body aches, no chills, no nausea vomiting or diarrhea, no shortness of breath or wheezing, no change in smell or taste  She has had her Covid vaccine and booster  No recent Covid exposure  No other aggravating or relieving factors.    No other c/o.  Past Medical History:  Diagnosis Date  . Allergy    fall seasonal  . Anxiety   . Blood transfusion    Platelet transfusion when on heparin  . Breast cancer ([facility] [place]) 2001   R breast(lumpectomy,chemo,radiation,tamoxifen)  . C. difficile diarrhea 02/2016   treated with Flagyl  . Complication of anesthesia    gets Jillyn Ledger going under she has been told  . Depression    treated  . Diabetes mellitus    type 2  . GERD (gastroesophageal reflux disease)   . Hyperlipidemia   . Hypertension    resolved after weight loss surgery; recurred 2013  . Insomnia chronic  . Iron deficiency    h/o  . Microalbuminuria    h/o  . OA (osteoarthritis) of knee   . Osteoporosis   . Personal history of chemotherapy   . Personal history of radiation therapy   . Plantar fasciitis (07') [facility]   s/p surgical  release 01/2011  . Sleep apnea    resolved after weight loss surgery; recurrent with weight gain; on CPAP  . Vitamin D deficiency    Current Outpatient Medications on File Prior to Visit  Medication Sig Dispense Refill  . acetaminophen (TYLENOL) 500 MG tablet Take 1,500 mg every 6 (six) hours as needed by mouth for moderate pain or headache.     . Alpha-D-Galactosidase (BEANO PO) Take 1 tablet by mouth daily.    . Calcium Carb-Cholecalciferol (CALCIUM 600 + D PO) Take 1 tablet by mouth 2 (two) times daily.    . Dulaglutide (TRULICITY) A999333 0000000 SOPN Inject 0.75 mg into the skin every 7 (seven) days. 6 mL 0  . lisinopril (ZESTRIL) 10 MG tablet Take 1 tablet (10 mg total) by mouth daily. 90  tablet 1  . metFORMIN (GLUCOPHAGE-XR) 500 MG 24 hr tablet Take 2 tablets (1,000 mg total) by mouth daily. 60 tablet 1  . Multiple Vitamin (MULTIVITAMIN WITH MINERALS) TABS tablet Take 1 tablet by mouth 2 (two) times daily.    . Multiple Vitamins-Minerals (HAIR SKIN AND NAILS FORMULA PO) Take 1 tablet by mouth daily.    . Omega-3 1000 MG CAPS Take 1,000 mg by mouth 2 (two) times daily.    Marland Kitchen omeprazole (PRILOSEC OTC) 20 MG tablet Take 1 capsule up to twice daily for the next 1-2 weeks 28 tablet 0  . PARoxetine (PAXIL) 20 MG tablet 1 tablet daily 90 tablet 0  . simethicone (MYLICON) 0000000 MG chewable tablet Chew 125 mg by mouth every 6 (six) hours as needed for flatulence.    . simvastatin (ZOCOR) 20 MG tablet Take 1 tablet (20 mg total) by mouth at bedtime. 90 tablet 0  . zolpidem (AMBIEN CR) 12.5 MG CR tablet Take 1 tablet (12.5 mg total) by mouth at bedtime. 90 tablet 0  . cetirizine (ZYRTEC) 10 MG tablet Take 10 mg by mouth daily.  (Patient not taking: No sig reported)    . LYSINE PO Take 1 tablet by mouth daily as needed (cold sore). (Patient not taking: No sig reported)    . methocarbamol (ROBAXIN) 500 MG tablet Take 1 tablet (500 mg total) by mouth every 6 (six) hours as needed for muscle spasms. (Patient not taking: Reported on 05/25/2020) 30 tablet 0   Current Facility-Administered Medications on File Prior to Visit  Medication Dose Route Frequency Provider Last Rate Last Admin  . 0.9 %  sodium chloride infusion  500 mL Intravenous Once Milus Banister, MD         The following portions of the patient's history were reviewed and updated as appropriate: allergies, current medications, past family history, past medical history, past social history, past surgical history and problem list.  ROS Otherwise as in subjective above  Objective: Ht 4\' 9"  (1.448 m)   Wt 148 lb (67.1 kg)   BMI 32.03 kg/m   General appearance: alert, no distress, well developed, well nourished Not examined in  person as this was a virtual consult     Assessment: Encounter Diagnoses  Name Primary?  . Right hip pain Yes  . Fall, initial encounter   . Cough      Plan: Right hip pain, fall-I suspect she had some initial internal bruising and may have a bursitis at this point.  I cannot completely rule out fracture so she will go for x-ray pending Covid screen.  We discussed that if no fracture, she may need to do some short-term medication for  inflammation or pain, stretching, possible ice therapy  Cough, mild URI symptoms on day 5 and a fully vaccinated patient.  She will come in for Covid screen today  We discussed rest, hydration, supportive therapy, quarantine.  We discussed worsening symptoms that would prompt recheck  Dayane was seen today for hip pain.  Diagnoses and all orders for this visit:  Right hip pain -     DG HIP UNILAT WITH PELVIS 2-3 VIEWS RIGHT; Future  Fall, initial encounter -     DG HIP UNILAT WITH PELVIS 2-3 VIEWS RIGHT; Future  Cough -     POC COVID-19 BinaxNow; Future   Follow-up this afternoon for Covid testing and subsequent x-ray of hip

## 2020-05-29 ENCOUNTER — Other Ambulatory Visit: Payer: Self-pay | Admitting: Family Medicine

## 2020-05-29 DIAGNOSIS — E119 Type 2 diabetes mellitus without complications: Secondary | ICD-10-CM

## 2020-06-08 NOTE — Patient Instructions (Addendum)
  HEALTH MAINTENANCE RECOMMENDATIONS:  It is recommended that you get at least 30 minutes of aerobic exercise at least 5 days/week (for weight loss, you may need as much as 60-90 minutes). This can be any activity that gets your heart rate up. This can be divided in 10-15 minute intervals if needed, but try and build up your endurance at least once a week.  Weight bearing exercise is also recommended twice weekly.  Eat a healthy diet with lots of vegetables, fruits and fiber.  "Colorful" foods have a lot of vitamins (ie green vegetables, tomatoes, red peppers, etc).  Limit sweet tea, regular sodas and alcoholic beverages, all of which has a lot of calories and sugar.  Up to 1 alcoholic drink daily may be beneficial for women (unless trying to lose weight, watch sugars).  Drink a lot of water.  Calcium recommendations are 1200-1500 mg daily (1500 mg for postmenopausal women or women without ovaries), and vitamin D 1000 IU daily.  This should be obtained from diet and/or supplements (vitamins), and calcium should not be taken all at once, but in divided doses.  Monthly self breast exams and yearly mammograms for women over the age of 14 is recommended.  Sunscreen of at least SPF 30 should be used on all sun-exposed parts of the skin when outside between the hours of 10 am and 4 pm (not just when at beach or pool, but even with exercise, golf, tennis, and yard work!)  Use a sunscreen that says "broad spectrum" so it covers both UVA and UVB rays, and make sure to reapply every 1-2 hours.  Remember to change the batteries in your smoke detectors when changing your clock times in the spring and fall. Carbon monoxide detectors are recommended for your home.  Use your seat belt every time you are in a car, and please drive safely and not be distracted with cell phones and texting while driving.  Please start checking your sugars again--this will help you realize when you're eating things you shouldn't be.   Your A1c went up to 7, related to changes in diet over the holidays. Try and get 30 minutes of exercise daily, as we discussed. Contact us if your fasting sugars are remaining over 140-150.  Increase your Aleve to taking two tablet TWICE daily with food. Take the omeprazole daily while you're taking this. Take it this way for 10-14 days (until your hip pain has resolved--no longer than 2 weeks). If your right hip pain doesn't improve after up to 2 weeks of prescription strength aleve, then schedule a visit and we can give a cortisone shot into the bursa.

## 2020-06-08 NOTE — Progress Notes (Signed)
Chief Complaint  Patient presents with  . Annual Exam    Nonfasting annual exam. Sees eye doctor regularly. Will come back tomorrow for labs. Could not give urine-tried. States her right ear is plugged. Her knees still hurt, mostly right and her right hip.     Julie Fischer is a 60 y.o. female who presents for a complete physical and follow-up on chronic problems.  Labs were not done prior to visit, she isn't fasting today.  She saw Audelia Acton recently with hip pain. She had fallen around Christmas, landing on her R hip.  X-ray was normal. She is still sore at the R lateral hip.  She is taking 2 Aleve in the morning, and tylenol at night.  She had reported mild cough/cold symptoms on the day of visit, so visit was done virtually. COVID test was negative. She has an occasionally runny nose and R ear is plugged. No longer coughing.  Diabetes. Sheis compliant with metformin and Trulicity.She was started on the lower dose, and kept at that dosedue to good control of sugars.She denies any side effects to her medication. She hasn't been checking her sugars recently (not for a couple of months). Denies hypoglycemia, polydipsia, polyuria. Checks feet regularly and has no concerns/lesions/pain.  Last eye exam was11/2021,no retinopathy. Lab Results  Component Value Date   HGBA1C 6.4 (H) 11/11/2019   OSA--doing well on CPAP. Compliant with daily use. Feels refreshed in the mornings, denies daytime somnolence.  Hypertension follow-up:BP's aren't checked routinely, but always within the normal range. Denies dizziness, headaches, chest pain. Denies side effects of medications, no cough. BP Readings from Last 3 Encounters:  06/09/20 130/80  11/16/19 128/80  06/05/19 128/68   Depression: Moods hadremainedwell controlled on the paroxetine40mg , so dose was changed to 20mg  at her last visit in 10/2019. She used up the 40mg  tablets she had, then switched to 20mg , and hasn't noticed any  recurrent depression, denies anxiety.  Hyperlipidemia follow-up: Patient is reportedly following a low-fat, low cholesterol diet. Compliant with medications (simvastatin and fish oil) and denies medication side effects.Lipids were at goal on last check.  She is due for recheck. Lab Results  Component Value Date   CHOL 142 05/11/2019   HDL 47 05/11/2019   LDLCALC 70 05/11/2019   TRIG 144 05/11/2019   CHOLHDL 3.0 05/11/2019   Osteoporosis: She istolerating Prolia without side effects. Last DEXA was 10/2019, T-2.0 at spine (improved). She was changed from fosamax to Prolia after 05/2017 DEXA, showing T-2.7 at spine.  Last injection was in 04/2020.  Insomnia: requires Ambien CR nightly. Ambien is effective in treating the insomnia--she doesn't sleep at all if she doesn't take it, and restless legs were much worse (last occurred when had issues with mail order supply getting lost/delayed).Denies unusual behavior (previously would ask husband for food, text people).This was last filled 05/01/20 for 90d supply.  Tiramisu also recently triggered a terrible time with restless legs.  H/o breast cancer: She is no longer under the care of oncologist (previously sawDr. Truddie Coco.) Last mammogram was 6/2021She checks her breasts regularly. She has some intermittent tenderness around her scar (chronic, unchanged).She denies any changes or breast concerns.  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 She has had some more flares recently (used to just get once a year).  She has been taking Lysine, which also helps.  Immunization History  Administered Date(s) Administered  . Influenza Split 02/07/2009, 04/10/2010, 04/19/2011, 01/28/2012  . Influenza,inj,Quad PF,6+ Mos 01/28/2013, 01/28/2014, 02/03/2015, 02/23/2016,  03/13/2017, 02/10/2018, 03/09/2019, 03/29/2020  . Influenza,inj,quad, With Preservative 02/18/2017  . PFIZER(Purple Top)SARS-COV-2 Vaccination 08/13/2019, 09/07/2019, 04/13/2020   . Pneumococcal Polysaccharide-23 06/04/2001, 11/15/2010, 03/15/2016  . Td 06/04/2001  . Tdap 11/15/2010  . Zoster Recombinat (Shingrix) 04/01/2018, 06/02/2018   Last Pap smear: s/p hysterectomy  Last mammogram:10/2019 Last colonoscopy: 06/2017, normal; recommended 5 year f/u due to FHx Last DEXA: 10/2019 T-2.0 at spine Ophtho: yearly, 03/2020 Dentist: twice yearly  Exercise: nothing regular. "I'm lazy"  Walks a lot in the summer.  PMH, PSH, SH and FH reviewed and updated  Outpatient Encounter Medications as of 06/09/2020  Medication Sig Note  . Calcium Carb-Cholecalciferol (CALCIUM 600 + D PO) Take 1 tablet by mouth 2 (two) times daily.   . cetirizine (ZYRTEC) 10 MG tablet Take 10 mg by mouth daily.   . Dulaglutide (TRULICITY) A999333 0000000 SOPN Inject 0.75 mg into the skin every 7 (seven) days.   Marland Kitchen lisinopril (ZESTRIL) 10 MG tablet Take 1 tablet (10 mg total) by mouth daily.   Marland Kitchen LYSINE PO Take 1 tablet by mouth daily as needed (cold sore).   . metFORMIN (GLUCOPHAGE-XR) 500 MG 24 hr tablet TAKE 2 TABLETS BY MOUTH EVERY DAY   . Multiple Vitamin (MULTIVITAMIN WITH MINERALS) TABS tablet Take 1 tablet by mouth 2 (two) times daily.   . Multiple Vitamins-Minerals (HAIR SKIN AND NAILS FORMULA PO) Take 1 tablet by mouth daily.   . naproxen sodium (ALEVE) 220 MG tablet Take 440 mg by mouth daily as needed. 06/09/2020: Takes 2 once daily in the morning due to her recent hip pain  . Omega-3 1000 MG CAPS Take 1,000 mg by mouth 2 (two) times daily.   Marland Kitchen PARoxetine (PAXIL) 20 MG tablet 1 tablet daily   . simvastatin (ZOCOR) 20 MG tablet Take 1 tablet (20 mg total) by mouth at bedtime.   Marland Kitchen zolpidem (AMBIEN CR) 12.5 MG CR tablet Take 1 tablet (12.5 mg total) by mouth at bedtime.   Marland Kitchen acetaminophen (TYLENOL) 500 MG tablet Take 1,500 mg every 6 (six) hours as needed by mouth for moderate pain or headache.  (Patient not taking: Reported on 06/09/2020)   . Alpha-D-Galactosidase (BEANO PO) Take 1 tablet by  mouth daily. (Patient not taking: Reported on 06/09/2020)   . methocarbamol (ROBAXIN) 500 MG tablet Take 1 tablet (500 mg total) by mouth every 6 (six) hours as needed for muscle spasms. (Patient not taking: No sig reported)   . omeprazole (PRILOSEC OTC) 20 MG tablet Take 1 capsule up to twice daily for the next 1-2 weeks (Patient not taking: Reported on 06/09/2020) 06/09/2020: Uses prn, about once a week  . simethicone (MYLICON) 0000000 MG chewable tablet Chew 125 mg by mouth every 6 (six) hours as needed for flatulence. (Patient not taking: Reported on 06/09/2020)    Facility-Administered Encounter Medications as of 06/09/2020  Medication  . 0.9 %  sodium chloride infusion   Allergies  Allergen Reactions  . Heparin Other (See Comments)    Low platelets  . Niacin And Related Other (See Comments)    Blotchy/itchy  . Tessalon [Benzonatate] Diarrhea  . Penicillins Rash and Other (See Comments)    Has patient had a PCN reaction causing immediate rash, facial/tongue/throat swelling, SOB or lightheadedness with hypotension: Unknown Has patient had a PCN reaction causing severe rash involving mucus membranes or skin necrosis: Unknown Has patient had a PCN reaction that required hospitalization: No Has patient had a PCN reaction occurring within the last 10 years: No If  all of the above answers are "NO", then may proceed with Cephalosporin use.     ROS: The patient denies anorexia,fever, headaches, vision changes, ear pain, sore throat, chest pain, palpitations, dizziness, syncope, dyspnea on exertion, cough, swelling, nausea, vomiting, constipation, abdominal pain, melena, hematochezia, hematuria, incontinence, dysuria, vaginal bleeding, discharge, odor or itch, genital lesions, numbness, tingling, weakness, tremor, suspicious skin lesions, abnormal bleeding/bruising, or enlarged lymph nodes.  Chronic insomnia, controlled with nightly ambien. Moods well controlled as per HPI. Bowels are well  controlled Still tend to lose things (glasses, keys, remote controls)--less of an issue since developing a routine of where to keep things. No decline in memory in the last year. Bilateral knee pain (stairs, hills, walks too much)   Persistent soreness at R lateral hip since a fall in 04/2020. More frequent heartburn since taking Aleve, about once a week. (previously was every few weeks). No skin concerns Congestion and ear plugging per HPI   PHYSICAL EXAM:  BP 130/80   Pulse 72   Ht 4\' 9"  (1.448 m)   Wt 155 lb (70.3 kg)   BMI 33.54 kg/m   Wt Readings from Last 3 Encounters:  06/09/20 155 lb (70.3 kg)  05/25/20 148 lb (67.1 kg)  11/16/19 149 lb 9.6 oz (67.9 kg)    General Appearance:  Alert, cooperative, no distress, appears stated age   Head:  Normocephalic, without obvious abnormality, atraumatic   Eyes:  PERRL, conjunctiva/corneas clear, EOM's intact, fundi benign   Ears:  Normal TM's and external ear canals. Extruded blue PE tube noted in the right canal, unchanged, surrounded by cerumen, partially occluding. L TM and EAC is normal.  Nose:  Not examined, wearing mask due to COVID-19 pandemic  Throat:  Not examined, wearing mask due to COVID-19 pandemic  Neck:  Supple, no lymphadenopathy; thyroid: no enlargement/ tenderness/nodules; no carotid bruit or JVD   Back:  Spine nontender, no curvature, ROM normal. No CVA tenderness  Lungs:  Clear to auscultation bilaterally without wheezes, rales or ronchi; respirations unlabored   Chest Wall:  No tenderness or deformity   Heart:  Regular rate and rhythm, S1 and S2 normal, no murmur, rub or gallop   Breast Exam:  R breast--s/p surgery with scarring and some skin changes related to previous radiation (telangiectasias) above the nipple. No focal mass. No nipple discharge or inversion. L breast exam is entirely normal. No axillary lymphadenopathy.  Abdomen:  Soft, nondistended, normoactive bowel sounds, no masses, no  hepatosplenomegaly   Genitalia:  Normal external genitalia without lesions. Mild atrophic changes are noted, no erythema. BUS and vagina normal; Bimanual exam revealed surgically absent uterus. No adnexal masses. No mass, rebound tenderness or guarding   Rectal:  Normal tone, no masses or tenderness; guaiac positive brown stool   Extremities:  No clubbing, cyanosis or edema. No lesions. Normal diabetic foot exam. WHSS B knees. Tender to palpation at R trochanteric bursa.  Painless ROM of hip.  Pulses:  2+ and symmetric all extremities   Skin:  Skin color, texture, turgor normal, no rashes or lesions   Lymph nodes:  Cervical, supraclavicular, and axillary nodes normal   Neurologic:  CNII-XII intact, normal strength, sensation and gait; reflexes 2+ and symmetric throughout                 Psych:  Normal mood, affect, hygiene and grooming  PHQ-9 score of 2 --she reports problems sleeping if she doesn't take the [place].  Only actually happened once recently when RLS  flared.  Normal diabetic foot exam  Lab Results  Component Value Date   HGBA1C 7.0 (A) 06/09/2020    ASSESSMENT/PLAN:  Annual physical exam - Plan: Urinalysis, Routine w reflex microscopic  Osteoporosis, unspecified osteoporosis type, unspecified pathological fracture presence - cont Prolia. Discussed Ca, D, weight-bearing exercise  OSA on CPAP - compliant; cont CPAP. Wt loss encouraged  Depression, major, in remission ([place])  Pure hypercholesterolemia - cont statin, due for labs  Essential hypertension, benign - controlled - Plan: Urinalysis, Routine w reflex microscopic  History of bariatric surgery - with regain of weight. Counseled re: diet, exercise, risks  Insomnia, unspecified type  Herpes labialis - cont valtrex prn  Type 2 diabetes mellitus without complication, without long-term current use of insulin (HCC) - Plan: HgB A1c, metFORMIN (GLUCOPHAGE-XR) 500 MG 24 hr tablet, Urinalysis,  Routine w reflex microscopic  Need for Tdap vaccination - Plan: Tdap vaccine greater than or equal to 7yo IM  Class 1 obesity due to excess calories with serious comorbidity and body mass index (BMI) of 33.0 to 33.9 in adult - comorbidities--DM, HTN, HLD, GERD, joint pain/OA  Heme positive stool - noted on exam; no abd pain. On NSAIDS--to take omeprazole while on Aleve. Check CBC. Will need further eval if anemic (vs repeat if nl hgb)  Trochanteric bursitis, right hip - declines injection--recommended 2 Aleve BID, and to restart omeprazole while on NSAIDs. f/u for injection if not improving   Return for fasting labs.   Discussed monthly self breast exams and yearly mammograms; at least 30 minutes of aerobic activity at least 5 days/week, weight-bearing exercise at least 2x/wk; proper sunscreen use reviewed; healthy diet, including goals of calcium and vitamin D intake and alcohol recommendations (less than or equal to 1 drink/day) reviewed; regular seatbelt use; changing batteries in smoke detectors. Immunization recommendations discussed--continue yearly flu shots.  Tdap given today. Colonoscopy recommendations reviewed, UTD.  Due again 06/2022 DEXA due  10/2021.  Please start checking your sugars again--this will help you realize when you're eating things you shouldn't be.  Your A1c went up to 7, related to changes in diet over the holidays. Try and get 30 minutes of exercise daily, as we discussed. Contact us if your fasting sugars are remaining over 140-150.

## 2020-06-09 ENCOUNTER — Encounter: Payer: Self-pay | Admitting: Family Medicine

## 2020-06-09 ENCOUNTER — Other Ambulatory Visit: Payer: Self-pay

## 2020-06-09 ENCOUNTER — Ambulatory Visit (INDEPENDENT_AMBULATORY_CARE_PROVIDER_SITE_OTHER): Payer: 59 | Admitting: Family Medicine

## 2020-06-09 VITALS — BP 130/80 | HR 72 | Ht <= 58 in | Wt 155.0 lb

## 2020-06-09 DIAGNOSIS — E7800 Pure hypercholesterolemia, unspecified: Secondary | ICD-10-CM | POA: Diagnosis not present

## 2020-06-09 DIAGNOSIS — I10 Essential (primary) hypertension: Secondary | ICD-10-CM

## 2020-06-09 DIAGNOSIS — E119 Type 2 diabetes mellitus without complications: Secondary | ICD-10-CM

## 2020-06-09 DIAGNOSIS — Z23 Encounter for immunization: Secondary | ICD-10-CM | POA: Diagnosis not present

## 2020-06-09 DIAGNOSIS — B001 Herpesviral vesicular dermatitis: Secondary | ICD-10-CM

## 2020-06-09 DIAGNOSIS — G4733 Obstructive sleep apnea (adult) (pediatric): Secondary | ICD-10-CM

## 2020-06-09 DIAGNOSIS — M7061 Trochanteric bursitis, right hip: Secondary | ICD-10-CM

## 2020-06-09 DIAGNOSIS — F325 Major depressive disorder, single episode, in full remission: Secondary | ICD-10-CM

## 2020-06-09 DIAGNOSIS — R195 Other fecal abnormalities: Secondary | ICD-10-CM

## 2020-06-09 DIAGNOSIS — E6609 Other obesity due to excess calories: Secondary | ICD-10-CM

## 2020-06-09 DIAGNOSIS — Z6833 Body mass index (BMI) 33.0-33.9, adult: Secondary | ICD-10-CM

## 2020-06-09 DIAGNOSIS — M810 Age-related osteoporosis without current pathological fracture: Secondary | ICD-10-CM | POA: Diagnosis not present

## 2020-06-09 DIAGNOSIS — Z0000 Encounter for general adult medical examination without abnormal findings: Secondary | ICD-10-CM | POA: Diagnosis not present

## 2020-06-09 DIAGNOSIS — Z9989 Dependence on other enabling machines and devices: Secondary | ICD-10-CM

## 2020-06-09 DIAGNOSIS — Z9884 Bariatric surgery status: Secondary | ICD-10-CM

## 2020-06-09 DIAGNOSIS — G4700 Insomnia, unspecified: Secondary | ICD-10-CM

## 2020-06-09 LAB — POCT GLYCOSYLATED HEMOGLOBIN (HGB A1C): Hemoglobin A1C: 7 % — AB (ref 4.0–5.6)

## 2020-06-09 MED ORDER — METFORMIN HCL ER 500 MG PO TB24: 1000.0000 mg | ORAL_TABLET | Freq: Every day | ORAL | 1 refills | Status: DC

## 2020-06-10 ENCOUNTER — Other Ambulatory Visit: Payer: 59

## 2020-06-26 ENCOUNTER — Other Ambulatory Visit: Payer: Self-pay | Admitting: Family Medicine

## 2020-06-26 DIAGNOSIS — E7800 Pure hypercholesterolemia, unspecified: Secondary | ICD-10-CM

## 2020-06-29 ENCOUNTER — Other Ambulatory Visit: Payer: 59

## 2020-07-05 ENCOUNTER — Other Ambulatory Visit: Payer: Self-pay

## 2020-07-05 ENCOUNTER — Other Ambulatory Visit: Payer: 59

## 2020-07-05 DIAGNOSIS — Z5181 Encounter for therapeutic drug level monitoring: Secondary | ICD-10-CM

## 2020-07-05 DIAGNOSIS — Z0000 Encounter for general adult medical examination without abnormal findings: Secondary | ICD-10-CM

## 2020-07-05 DIAGNOSIS — E7800 Pure hypercholesterolemia, unspecified: Secondary | ICD-10-CM

## 2020-07-05 DIAGNOSIS — E119 Type 2 diabetes mellitus without complications: Secondary | ICD-10-CM

## 2020-07-05 DIAGNOSIS — I10 Essential (primary) hypertension: Secondary | ICD-10-CM

## 2020-07-06 LAB — CBC WITH DIFFERENTIAL/PLATELET
Basophils Absolute: 0.1 10*3/uL (ref 0.0–0.2)
Basos: 1 %
EOS (ABSOLUTE): 0.3 10*3/uL (ref 0.0–0.4)
Eos: 5 %
Hematocrit: 34.5 % (ref 34.0–46.6)
Hemoglobin: 10.8 g/dL — ABNORMAL LOW (ref 11.1–15.9)
Immature Grans (Abs): 0 10*3/uL (ref 0.0–0.1)
Immature Granulocytes: 0 %
Lymphocytes Absolute: 2.8 10*3/uL (ref 0.7–3.1)
Lymphs: 39 %
MCH: 26.1 pg — ABNORMAL LOW (ref 26.6–33.0)
MCHC: 31.3 g/dL — ABNORMAL LOW (ref 31.5–35.7)
MCV: 83 fL (ref 79–97)
Monocytes Absolute: 0.6 10*3/uL (ref 0.1–0.9)
Monocytes: 8 %
Neutrophils Absolute: 3.4 10*3/uL (ref 1.4–7.0)
Neutrophils: 47 %
Platelets: 228 10*3/uL (ref 150–450)
RBC: 4.14 x10E6/uL (ref 3.77–5.28)
RDW: 14.7 % (ref 11.7–15.4)
WBC: 7.2 10*3/uL (ref 3.4–10.8)

## 2020-07-06 LAB — COMPREHENSIVE METABOLIC PANEL
ALT: 30 IU/L (ref 0–32)
AST: 30 IU/L (ref 0–40)
Albumin/Globulin Ratio: 2.3 — ABNORMAL HIGH (ref 1.2–2.2)
Albumin: 4.6 g/dL (ref 3.8–4.8)
Alkaline Phosphatase: 46 IU/L (ref 44–121)
BUN/Creatinine Ratio: 20 (ref 12–28)
BUN: 12 mg/dL (ref 8–27)
Bilirubin Total: <0.2 mg/dL (ref 0.0–1.2)
CO2: 22 mmol/L (ref 20–29)
Calcium: 9.9 mg/dL (ref 8.7–10.3)
Chloride: 99 mmol/L (ref 96–106)
Creatinine, Ser: 0.61 mg/dL (ref 0.57–1.00)
GFR calc Af Amer: 113 mL/min/{1.73_m2} (ref >59)
GFR calc non Af Amer: 98 mL/min/{1.73_m2} (ref >59)
Globulin, Total: 2 g/dL (ref 1.5–4.5)
Glucose: 120 mg/dL — ABNORMAL HIGH (ref 65–99)
Potassium: 5.1 mmol/L (ref 3.5–5.2)
Sodium: 139 mmol/L (ref 134–144)
Total Protein: 6.6 g/dL (ref 6.0–8.5)

## 2020-07-06 LAB — MICROALBUMIN / CREATININE URINE RATIO
Creatinine, Urine: 134.8 mg/dL
Microalb/Creat Ratio: 23 mg/g creat (ref 0–29)
Microalbumin, Urine: 31.6 ug/mL

## 2020-07-06 LAB — URINALYSIS, ROUTINE W REFLEX MICROSCOPIC
Bilirubin, UA: NEGATIVE
Glucose, UA: NEGATIVE
Ketones, UA: NEGATIVE
Nitrite, UA: NEGATIVE
Protein,UA: NEGATIVE
RBC, UA: NEGATIVE
Specific Gravity, UA: 1.022 (ref 1.005–1.030)
Urobilinogen, Ur: 0.2 mg/dL (ref 0.2–1.0)
pH, UA: 5.5 (ref 5.0–7.5)

## 2020-07-06 LAB — TSH: TSH: 1.39 u[IU]/mL (ref 0.450–4.500)

## 2020-07-06 LAB — LIPID PANEL
Chol/HDL Ratio: 3.3 ratio (ref 0.0–4.4)
Cholesterol, Total: 147 mg/dL (ref 100–199)
HDL: 45 mg/dL (ref >39)
LDL Chol Calc (NIH): 69 mg/dL (ref 0–99)
Triglycerides: 198 mg/dL — ABNORMAL HIGH (ref 0–149)
VLDL Cholesterol Cal: 33 mg/dL (ref 5–40)

## 2020-07-06 LAB — MICROSCOPIC EXAMINATION
Bacteria, UA: NONE SEEN
Casts: NONE SEEN /lpf
RBC, Urine: NONE SEEN /hpf (ref 0–2)
WBC, UA: NONE SEEN /hpf (ref 0–5)

## 2020-07-07 ENCOUNTER — Encounter: Payer: Self-pay | Admitting: Family Medicine

## 2020-07-07 ENCOUNTER — Other Ambulatory Visit: Payer: Self-pay | Admitting: Family Medicine

## 2020-07-07 DIAGNOSIS — R195 Other fecal abnormalities: Secondary | ICD-10-CM

## 2020-07-07 DIAGNOSIS — D509 Iron deficiency anemia, unspecified: Secondary | ICD-10-CM

## 2020-07-07 LAB — SPECIMEN STATUS REPORT

## 2020-07-07 LAB — VITAMIN B12: Vitamin B-12: >2000 pg/mL — ABNORMAL HIGH (ref 232–1245)

## 2020-07-07 LAB — IRON: Iron: 46 ug/dL (ref 27–139)

## 2020-07-07 LAB — FERRITIN: Ferritin: 10 ng/mL — ABNORMAL LOW (ref 15–150)

## 2020-07-10 ENCOUNTER — Other Ambulatory Visit: Payer: Self-pay | Admitting: Family Medicine

## 2020-07-10 DIAGNOSIS — F325 Major depressive disorder, single episode, in full remission: Secondary | ICD-10-CM

## 2020-07-14 ENCOUNTER — Encounter: Payer: Self-pay | Admitting: Family Medicine

## 2020-07-19 NOTE — Progress Notes (Signed)
Chief Complaint  Patient presents with  . Hip Pain    Right hip pain x 3 months, would like to get injection today. Also brought in OTC iron supplement to make sure it is adequate. 1 tablet = 45mg .     Patient was noted to have R trochanteric bursitis at her physical in January.  She elected to try NSAIDs (Aleve) x 2 weeks, but she now presents for cortisone injection to treat the bursitis. She didn't get much benefit from the 2 week course of NSAID.  She took omeprazole for the 2 weeks she took Aleve, now back to using it just prn (rarely).  She is scheduled to see GI on 3/14 to discuss her iron deficiency anemia. She had heme + stool on exam at physical. Her husband bought Slo-Fe.  She has been taking it qod (45 mg elemental iron), asking if okay to take daily. She is tolerating it without side effects.   PMH, PSH, SH reviewed  Outpatient Encounter Medications as of 07/20/2020  Medication Sig Note  . acetaminophen (TYLENOL) 500 MG tablet Take 1,500 mg by mouth every 6 (six) hours as needed for moderate pain or headache.   . Calcium Carb-Cholecalciferol (CALCIUM 600 + D PO) Take 1 tablet by mouth 2 (two) times daily.   . cetirizine (ZYRTEC) 10 MG tablet Take 10 mg by mouth daily.   . Ferrous Sulfate (IRON SLOW RELEASE) 140 (45 Fe) MG TBCR Take 1 tablet by mouth daily.   Marland Kitchen lisinopril (ZESTRIL) 10 MG tablet Take 1 tablet (10 mg total) by mouth daily.   . metFORMIN (GLUCOPHAGE-XR) 500 MG 24 hr tablet Take 2 tablets (1,000 mg total) by mouth daily.   . Multiple Vitamin (MULTIVITAMIN WITH MINERALS) TABS tablet Take 1 tablet by mouth 2 (two) times daily.   . Multiple Vitamins-Minerals (HAIR SKIN AND NAILS FORMULA PO) Take 1 tablet by mouth daily.   . Omega-3 1000 MG CAPS Take 1,000 mg by mouth 2 (two) times daily.   Marland Kitchen PARoxetine (PAXIL) 20 MG tablet TAKE 1 TABLET BY MOUTH EVERY DAY   . simvastatin (ZOCOR) 20 MG tablet TAKE 1 TABLET BY MOUTH EVERYDAY AT BEDTIME   . zolpidem (AMBIEN CR) 12.5 MG CR  tablet Take 1 tablet (12.5 mg total) by mouth at bedtime.   . [DISCONTINUED] Dulaglutide (TRULICITY) 1.69 IH/0.3UU SOPN Inject 0.75 mg into the skin every 7 (seven) days.   . Alpha-D-Galactosidase (BEANO PO) Take 1 tablet by mouth daily. (Patient not taking: No sig reported)   . LYSINE PO Take 1 tablet by mouth daily as needed (cold sore). (Patient not taking: Reported on 07/20/2020)   . methocarbamol (ROBAXIN) 500 MG tablet Take 1 tablet (500 mg total) by mouth every 6 (six) hours as needed for muscle spasms. (Patient not taking: No sig reported)   . naproxen sodium (ALEVE) 220 MG tablet Take 440 mg by mouth daily as needed. (Patient not taking: Reported on 07/20/2020) 06/09/2020: Takes 2 once daily in the morning due to her recent hip pain  . omeprazole (PRILOSEC OTC) 20 MG tablet Take 1 capsule up to twice daily for the next 1-2 weeks (Patient not taking: No sig reported) 06/09/2020: Uses prn, about once a week  . simethicone (MYLICON) 828 MG chewable tablet Chew 125 mg by mouth every 6 (six) hours as needed for flatulence. (Patient not taking: No sig reported)    Facility-Administered Encounter Medications as of 07/20/2020  Medication  . 0.9 %  sodium chloride infusion  . [  COMPLETED] bupivacaine (MARCAINE) 0.5 % (with pres) injection 1.5 mL  . [COMPLETED] lidocaine (XYLOCAINE) 2 % (with pres) injection 30 mg  . [COMPLETED] triamcinolone acetonide (KENALOG-40) injection 20 mg   Allergies  Allergen Reactions  . Heparin Other (See Comments)    Low platelets  . Niacin And Related Other (See Comments)    Blotchy/itchy  . Tessalon [Benzonatate] Diarrhea  . Penicillins Rash and Other (See Comments)    Has patient had a PCN reaction causing immediate rash, facial/tongue/throat swelling, SOB or lightheadedness with hypotension: Unknown Has patient had a PCN reaction causing severe rash involving mucus membranes or skin necrosis: Unknown Has patient had a PCN reaction that required hospitalization:  No Has patient had a PCN reaction occurring within the last 10 years: No If all of the above answers are "NO", then may proceed with Cephalosporin use.    ROS: no fever, chills, URI symptoms, headaches, dizziness, chest pain, shortness of breath. She denies any abdominal pain, nausea, any bleeding, bruising. No urinary complaints or other concerns, other than R hip pain per HPI.   PHYSICAL EXAM:  BP 138/70   Pulse 72   Ht 4' 8.5" (1.435 m)   Wt 151 lb 9.6 oz (68.8 kg)   BMI 33.39 kg/m    Wt Readings from Last 3 Encounters:  07/20/20 151 lb 9.6 oz (68.8 kg)  06/09/20 155 lb (70.3 kg)  05/25/20 148 lb (67.1 kg)   Pleasant, obese female, in good spirits. She is alert, oriented, in no distress She is tender over the right greater trochanter.  nontender along ITB, TFL.   Verbal consent obtained, understands risks and alternatives, wishes to proceed. R lateral hip was cleansed with alcohol x 3.  Injected 20mg  kenalog, and 1.5 cc each of bupivicaine and 2% lidocaine.  She tolerated the procedure well, without complications.  Nontender on repeat exam after injection   ASSESSMENT/PLAN:  Greater trochanteric bursitis of right hip - Plan: bupivacaine (MARCAINE) 0.5 % (with pres) injection 1.5 mL, triamcinolone acetonide (KENALOG-40) injection 20 mg, lidocaine (XYLOCAINE) 2 % (with pres) injection 30 mg, PR DRAIN/INJECT LARGE JOINT/BURSA  Hip pain  Iron deficiency anemia, unspecified iron deficiency anemia type - f/u with GI as planned.  May take Slo-Fe daily   F/u as previously scheduled, sooner prn.

## 2020-07-20 ENCOUNTER — Encounter: Payer: Self-pay | Admitting: Family Medicine

## 2020-07-20 ENCOUNTER — Other Ambulatory Visit: Payer: Self-pay | Admitting: *Deleted

## 2020-07-20 ENCOUNTER — Other Ambulatory Visit: Payer: Self-pay

## 2020-07-20 ENCOUNTER — Ambulatory Visit (INDEPENDENT_AMBULATORY_CARE_PROVIDER_SITE_OTHER): Payer: 59 | Admitting: Family Medicine

## 2020-07-20 VITALS — BP 138/70 | HR 72 | Ht <= 58 in | Wt 151.6 lb

## 2020-07-20 DIAGNOSIS — D509 Iron deficiency anemia, unspecified: Secondary | ICD-10-CM

## 2020-07-20 DIAGNOSIS — M7061 Trochanteric bursitis, right hip: Secondary | ICD-10-CM | POA: Diagnosis not present

## 2020-07-20 DIAGNOSIS — M25559 Pain in unspecified hip: Secondary | ICD-10-CM

## 2020-07-20 DIAGNOSIS — E119 Type 2 diabetes mellitus without complications: Secondary | ICD-10-CM

## 2020-07-20 MED ORDER — BUPIVACAINE HCL 0.5 % IJ SOLN
1.5000 mL | Freq: Once | INTRAMUSCULAR | Status: AC
  Administered 2020-07-20: 1.5 mL

## 2020-07-20 MED ORDER — TRIAMCINOLONE ACETONIDE 40 MG/ML IJ SUSP
20.0000 mg | Freq: Once | INTRAMUSCULAR | Status: AC
  Administered 2020-07-20: 20 mg via INTRAMUSCULAR

## 2020-07-20 MED ORDER — TRULICITY 0.75 MG/0.5ML ~~LOC~~ SOAJ: 0.7500 mg | SUBCUTANEOUS | 0 refills | Status: DC

## 2020-07-20 MED ORDER — LIDOCAINE HCL 2 % IJ SOLN
1.5000 mL | Freq: Once | INTRAMUSCULAR | Status: AC
Start: 2020-07-20 — End: 2020-07-20
  Administered 2020-07-20: 30 mg

## 2020-07-20 NOTE — Patient Instructions (Signed)
Hip Bursitis  Hip bursitis is the inflammation of one or more bursae in the hip joint. Bursae are small fluid-filled sacs that absorb shock and prevent bones from rubbing against each other. Hip bursitis can cause mild to moderate pain, and symptoms often come and go over time. What are the causes? This condition results from increased friction between the hip bones and the tendons around the hip joint. This condition can happen if you:  Overuse your hip muscles.  Injure your hip.  Have weak buttocks muscles.  Have bone spurs.  Have an infection. In some cases, the cause may not be known. What increases the risk? You are more likely to develop this condition if:  You injured your hip previously or had hip surgery.  You have a medical condition, such as arthritis, gout, diabetes, or thyroid disease.  You have spine problems.  You have one leg that is shorter than the other.  You participate in athletic activities that include repetitive motion, like running.  You participate in sports where there is a risk of injury or falling, such as football, martial arts, or skiing. What are the signs or symptoms? Symptoms may come and go, and they often include:  Pain in the hip or groin area. Pain may get worse with movement.  Tenderness and swelling of the hip. In rare cases, the bursa may become infected. If this happens, you may get a fever, as well as warmth and redness in the hip area. How is this diagnosed? This condition may be diagnosed based on:  Your symptoms.  Your medical history.  A physical exam.  Imaging tests, such as: ? X-rays to check your bones. ? MRI or ultrasound to check your tendons and muscles. ? Bone scan.  A biopsy to remove fluid from your inflamed bursa for testing. How is this treated? This condition is treated by resting, icing, applying pressure (compression), and raising (elevating) the injured area. This is called RICE treatment. In some  cases, RICE treatment may not be enough to make your symptoms go away. Treatment may also include:  Taking medicine to help with swelling and pain.  Using crutches, a cane, or a walker to decrease the strain on your hip.  Getting a shot of cortisone medicine to help reduce swelling.  Taking other medicines if the bursa is infected.  Draining fluid out of the bursa to help relieve swelling.  Having surgery to remove a damaged or infected bursa. This is rare. Long-term treatment may include:  Physical therapy exercises for strength and flexibility.  Lifestyle changes, such as weight loss, to reduce the strain on the hip. Follow these instructions at home: Managing pain, stiffness, and swelling  If directed, put ice on the painful area. ? Put ice in a plastic bag. ? Place a towel between your skin and the bag. ? Leave the ice on for 20 minutes, 2-3 times a day.  Raise (elevate) your hip as much as you can without pain. To do this, put a pillow under your hips while you lie down.  If directed, apply heat to the affected area as often as told by your health care provider. Use the heat source that your health care provider recommends, such as a moist heat pack or a heating pad. ? Place a towel between your skin and the heat source. ? Leave the heat on for 20-30 minutes. ? Remove the heat if your skin turns bright red. This is especially important if you are unable  to feel pain, heat, or cold. You may have a greater risk of getting burned.      Activity  Do not use your hip to support your body weight until your health care provider says that you can. Use crutches, a cane, or a walker as told by your health care provider.  If the affected leg is one that you use to drive, ask your health care provider if it is safe to drive.  Rest and protect your hip as much as possible until your pain and swelling get better.  Return to your normal activities as told by your health care provider.  Ask your health care provider what activities are safe for you.  Do exercises as told by your health care provider. General instructions  Take over-the-counter and prescription medicines only as told by your health care provider.  Gently massage and stretch your injured area as often as is comfortable.  Wear compression wraps only as told by your health care provider.  If one of your legs is shorter than the other, get fitted for a shoe insert or orthotic.  Maintain a healthy weight. Follow instructions from your health care provider for weight control. These may include dietary restrictions.  Keep all follow-up visits as told by your health care provider. This is important. How is this prevented?  Exercise regularly, as told by your health care provider.  Wear supportive footwear that is appropriate for your sport.  Warm up and stretch before being active. Cool down and stretch after being active.  Take breaks regularly from repetitive activity.  If an activity irritates your hip or causes pain, avoid the activity as much as possible.  Avoid sitting down for long periods at a time. Where to find more information  [employer]: orthoinfo.aaos.org Contact a health care provider if:  You have a fever.  You develop new symptoms.  You have trouble walking or doing everyday activities.  You have pain that gets worse or does not get better with medicine.  You develop red skin or a feeling of warmth in your hip area. Get help right away if:  You cannot move your hip.  You have severe pain.  You cannot control the muscles in your feet. Summary  Hip bursitis is the inflammation of one or more bursae in the hip joint. Bursae are small fluid-filled sacs that absorb shock and prevent bones from rubbing against each other.  Hip bursitis can cause hip or groin pain, and symptoms often come and go over time.  This condition is often treated by  resting, icing, applying pressure (compression), and raising (elevating) the injured area. Other treatments may be needed. This information is not intended to replace advice given to you by your health care provider. Make sure you discuss any questions you have with your health care provider. Document Revised: 03/09/2019 Document Reviewed: 01/13/2018 Elsevier Patient Education  2021 [employer].

## 2020-07-22 ENCOUNTER — Encounter: Payer: Self-pay | Admitting: Family Medicine

## 2020-07-24 ENCOUNTER — Other Ambulatory Visit: Payer: Self-pay | Admitting: Family Medicine

## 2020-07-24 DIAGNOSIS — I10 Essential (primary) hypertension: Secondary | ICD-10-CM

## 2020-08-01 ENCOUNTER — Ambulatory Visit (INDEPENDENT_AMBULATORY_CARE_PROVIDER_SITE_OTHER): Payer: 59 | Admitting: Physician Assistant

## 2020-08-01 ENCOUNTER — Encounter: Payer: Self-pay | Admitting: Physician Assistant

## 2020-08-01 ENCOUNTER — Other Ambulatory Visit: Payer: Self-pay | Admitting: Family Medicine

## 2020-08-01 VITALS — BP 100/64 | HR 69 | Ht <= 58 in | Wt 158.0 lb

## 2020-08-01 DIAGNOSIS — Z9884 Bariatric surgery status: Secondary | ICD-10-CM

## 2020-08-01 DIAGNOSIS — G4700 Insomnia, unspecified: Secondary | ICD-10-CM

## 2020-08-01 DIAGNOSIS — D509 Iron deficiency anemia, unspecified: Secondary | ICD-10-CM

## 2020-08-01 DIAGNOSIS — Z800 Family history of malignant neoplasm of digestive organs: Secondary | ICD-10-CM

## 2020-08-01 DIAGNOSIS — R195 Other fecal abnormalities: Secondary | ICD-10-CM | POA: Diagnosis not present

## 2020-08-01 MED ORDER — ZOLPIDEM TARTRATE ER 12.5 MG PO TBCR
12.5000 mg | EXTENDED_RELEASE_TABLET | Freq: Every day | ORAL | 0 refills | Status: DC
Start: 2020-08-01 — End: 2020-10-24

## 2020-08-01 MED ORDER — NA SULFATE-K SULFATE-MG SULF 17.5-3.13-1.6 GM/177ML PO SOLN: 1.0000 | Freq: Once | ORAL | 0 refills | Status: AC

## 2020-08-01 NOTE — Progress Notes (Signed)
Subjective:    Patient ID: Julie Fischer, female    DOB: 1960/06/17, 60 y.o.   MRN: [id number]  HPI Julie Fischer is a pleasant 60 year old white female, known to Dr. Ardis Hughs who is referred back today by Dr. Rita Ohara for evaluation of iron deficiency anemia and Hemoccult positive stool. Patient has family history of colon cancer in her mother.  Patient last had colonoscopy in February 2019 which was normal exam and was indicated for 5-year interval follow-up. She has history of remote Roux-en-Y gastric bypass done about 15 years ago, breast cancer, adult onset diabetes mellitus, sleep apnea, hypertension and osteopenia.  She is status post left total knee replacement and has recently been dealing with bursitis. Because of the bursitis she had been taking up to 4 Aleve per day for about 2 weeks and then backed off to 1 Aleve per day.  She just had a cortisone injection into the hip which also was complicated by some nausea. She says her bowel movements have been normal with 1-2 bowel movements per day she has not noted any melena or hematochezia.  She underwent routine physical and February and as part of that had Hemoccult done which was positive. She also had labs done February 2022 with hemoglobin of 10.8 hematocrit of 34.5 B12 greater than 2000 ferritin of 10 and serum iron of 46. She has been started on ferrous sulfate 140 mg daily.  She took omeprazole for about 2 weeks while she was on the higher dose Aleve and then stopped. She does not have prior history of iron deficiency that she is aware of  Review of Systems Pertinent positive and negative review of systems were noted in the above HPI section.  All other review of systems was otherwise negative.  Outpatient Encounter Medications as of 08/01/2020  Medication Sig  . acetaminophen (TYLENOL) 500 MG tablet Take 1,500 mg by mouth every 6 (six) hours as needed for moderate pain or headache.  . Alpha-D-Galactosidase (BEANO PO) Take 1 tablet by  mouth daily.  . Calcium Carb-Cholecalciferol (CALCIUM 600 + D PO) Take 1 tablet by mouth 2 (two) times daily.  . cetirizine (ZYRTEC) 10 MG tablet Take 10 mg by mouth daily.  . Dulaglutide (TRULICITY) 5.85 ID/7.8EU SOPN Inject 0.75 mg into the skin every 7 (seven) days.  . Ferrous Sulfate 140 (45 Fe) MG TBCR Take 1 tablet by mouth daily.  Marland Kitchen lisinopril (ZESTRIL) 10 MG tablet TAKE 1 TABLET BY MOUTH EVERY DAY  . LYSINE PO Take 1 tablet by mouth daily as needed (cold sore).  . metFORMIN (GLUCOPHAGE-XR) 500 MG 24 hr tablet Take 2 tablets (1,000 mg total) by mouth daily.  . Multiple Vitamin (MULTIVITAMIN WITH MINERALS) TABS tablet Take 1 tablet by mouth 2 (two) times daily.  . Multiple Vitamins-Minerals (HAIR SKIN AND NAILS FORMULA PO) Take 1 tablet by mouth daily.  . Na Sulfate-K Sulfate-Mg Sulf 17.5-3.13-1.6 GM/177ML SOLN Take 1 kit by mouth once for 1 dose.  . naproxen sodium (ALEVE) 220 MG tablet Take 440 mg by mouth daily as needed.  . Omega-3 1000 MG CAPS Take 1,000 mg by mouth 2 (two) times daily.  Marland Kitchen omeprazole (PRILOSEC OTC) 20 MG tablet Take 1 capsule up to twice daily for the next 1-2 weeks  . PARoxetine (PAXIL) 20 MG tablet TAKE 1 TABLET BY MOUTH EVERY DAY  . simethicone (MYLICON) 235 MG chewable tablet Chew 125 mg by mouth every 6 (six) hours as needed for flatulence.  . simvastatin (ZOCOR) 20  MG tablet TAKE 1 TABLET BY MOUTH EVERYDAY AT BEDTIME  . zolpidem (AMBIEN CR) 12.5 MG CR tablet Take 1 tablet (12.5 mg total) by mouth at bedtime.  . [DISCONTINUED] methocarbamol (ROBAXIN) 500 MG tablet Take 1 tablet (500 mg total) by mouth every 6 (six) hours as needed for muscle spasms. (Patient not taking: No sig reported)   Facility-Administered Encounter Medications as of 08/01/2020  Medication  . 0.9 %  sodium chloride infusion   Allergies  Allergen Reactions  . Heparin Other (See Comments)    Low platelets  . Niacin And Related Other (See Comments)    Blotchy/itchy  . Tessalon  [Benzonatate] Diarrhea  . Penicillins Rash and Other (See Comments)    Has patient had a PCN reaction causing immediate rash, facial/tongue/throat swelling, SOB or lightheadedness with hypotension: Unknown Has patient had a PCN reaction causing severe rash involving mucus membranes or skin necrosis: Unknown Has patient had a PCN reaction that required hospitalization: No Has patient had a PCN reaction occurring within the last 10 years: No If all of the above answers are "NO", then may proceed with Cephalosporin use.    Patient Active Problem List   Diagnosis Date Noted  . Right hip pain 05/25/2020  . Fall 05/25/2020  . Frequent urination 04/10/2019  . Dysuria 04/10/2019  . Vaginal dryness 04/10/2019  . Vaginitis, atrophic 04/10/2019  . Post-menopausal 04/10/2019  . S/P hysterectomy 04/10/2019  . Stiffness of right knee 01/10/2018  . Family hx of colon cancer 06/05/2017  . S/P TKR (total knee replacement), left 06/05/2017  . Depressive disorder 05/24/2017  . OA (osteoarthritis) of knee 04/08/2017  . Eustachian tube dysfunction, right 03/19/2017  . Mixed conductive and sensorineural hearing loss of right ear with restricted hearing of left ear 03/19/2017  . History of bariatric surgery 07/29/2013  . Obesity (BMI 30-39.9) 07/29/2013  . Osteoporosis 01/28/2013  . Allergic rhinitis 01/28/2013  . Anemia, iron deficiency 07/30/2012  . Impaired fasting glucose 12/10/2011  . Essential hypertension, benign 12/10/2011  . Pure hypercholesterolemia 11/15/2010  . Insomnia 11/15/2010  . Depression, major, in remission ([place]) 11/15/2010  . Osteopenia 11/15/2010  . History of breast cancer 11/15/2010   Social History   Socioeconomic History  . Marital status: Married    Spouse name: Not on file  . Number of children: 0  . Years of education: Not on file  . Highest education level: Not on file  Occupational History  . Occupation: retired  Tobacco Use  . Smoking status: Former Smoker     Quit date: 01/28/1996    Years since quitting: 24.5  . Smokeless tobacco: Never Used  Vaping Use  . Vaping Use: Never used  Substance and Sexual Activity  . Alcohol use: Yes    Comment: 1/2 beer every 1-2 months or less  . Drug use: No  . Sexual activity: Yes    Partners: Male  Other Topics Concern  . Not on file  Social History Narrative   Married. Lives with husband, 1 dog (boxer-lab mix, Primitivo Gauze).   Not currently working.   Husband retired.   Social Determinants of Health   Financial Resource Strain: Not on file  Food Insecurity: Not on file  Transportation Needs: Not on file  Physical Activity: Not on file  Stress: Not on file  Social Connections: Not on file  Intimate Partner Violence: Not on file    Julie Fischer's family history includes Alcohol abuse in her brother; Breast cancer in her cousin and cousin;  Colon cancer (age of onset: 93) in her mother; Dementia in her mother; Diabetes in her brother and maternal grandmother; Drug abuse in her brother; Berenice Primas' disease in her sister; Hepatitis C in her brother; Hyperlipidemia in her brother, sister, and sister; Hypertension in her brother; Lung cancer in her father; Macular degeneration in her mother; Pancreatic cancer in her father; Stroke (age of onset: 36) in her mother.      Objective:    Vitals:   08/01/20 0908  BP: 100/64  Pulse: 69  SpO2: 97%    Physical Exam Well-developed well-nourished older white female in no acute distress.  Height, Weight, 158 BMI 34.1  HEENT; nontraumatic normocephalic, EOMI, PE R LA, sclera anicteric. Oropharynx; not examined Neck; supple, no JVD Cardiovascular; regular rate and rhythm with S1-S2, no murmur rub or gallop Pulmonary; Clear bilaterally Abdomen; soft, obese, nontender, midline incisional scar nondistended, no palpable mass or hepatosplenomegaly, bowel sounds are active Rectal; not done today, recently documented heme positive Skin; benign exam, no jaundice rash or  appreciable lesions Extremities; no clubbing cyanosis or edema skin warm and dry Neuro/Psych; alert and oriented x4, grossly nonfocal mood and affect appropriate       Assessment & Plan:   #20 60 year old female with new iron deficiency anemia and heme positive stool. Patient is status post remote Roux-en-Y gastric bypass about 15 years ago, has no prior history of iron deficiency related to the gastric bypass. Family history is positive for colon cancer in patient's mother Patient had normal colonoscopy February 2019  Etiology of the iron deficiency and heme positive stool is not clear.  Rule out chronic gastropathy, poor oral iron absorption secondary to Roux-en-Y gastric bypass, occult gastric or colonic or small bowel lesion. She had been taking higher dose NSAIDs for a few weeks and generally takes intermittent low-dose NSAIDs i.e. Aleve 1/day.  #2 adult onset diabetes mellitus 3.  Sleep apnea with CPAP use no oxygen 4.  History of breast cancer 5.  Osteopenia 6.  Status post left total knee replacement 7.  Hypertension  Plan; patient will be scheduled for colonoscopy and EGD with Dr. Bryan Lemma.  Dr. Ardis Hughs did not have any availability over the next 6 weeks and patient will be going out of town at that point for at least 3 months. Procedures were discussed in detail with the patient including indications risks and benefits and she is agreeable to proceed. Discontinue Aleve Increase iron supplement to ferrous sulfate 140 mg p.o. twice daily Repeat iron studies and CBC early April, orders placed. Further recommendations pending findings at endoscopic evaluation.  Mazal Ebey Genia Harold PA-C 08/01/2020   Cc: Rita Ohara, MD

## 2020-08-01 NOTE — Patient Instructions (Addendum)
If you are age 62 or older, your body mass index should be between 23-30. Your Body mass index is 34.19 kg/m. If this is out of the aforementioned range listed, please consider follow up with your Primary Care Provider.  If you are age 21 or younger, your body mass index should be between 19-25. Your Body mass index is 34.19 kg/m. If this is out of the aformentioned range listed, please consider follow up with your Primary Care Provider.   You have been scheduled for an endoscopy and colonoscopy. Please follow the written instructions given to you at your visit today. Please pick up your prep supplies at the pharmacy within the next 1-3 days. If you use inhalers (even only as needed), please bring them with you on the day of your procedure.  STOP Aleve  STOP using Iron starting today. Restart after your Procedure taking 1 tablet twice daily.  Your provider has requested that you go to the basement level for lab work the first week of April. Press "B" on the elevator. The lab is located at the first door on the left as you exit the elevator.  Follow up pending the results of your labs and procedures, or as needed.  Thank you for entrusting me with your care and choosing [facility].  Amy Esterwood, PA-C

## 2020-08-01 NOTE — Telephone Encounter (Signed)
Is this okay to refill? 

## 2020-08-03 NOTE — Progress Notes (Signed)
Agree with the assessment and plan as outlined by Amy Esterwood, PA-C.  Shaheed Schmuck, DO, FACG  

## 2020-08-04 ENCOUNTER — Encounter: Payer: Self-pay | Admitting: Gastroenterology

## 2020-08-05 ENCOUNTER — Encounter: Payer: Self-pay | Admitting: Gastroenterology

## 2020-08-05 ENCOUNTER — Other Ambulatory Visit: Payer: Self-pay

## 2020-08-05 ENCOUNTER — Ambulatory Visit (AMBULATORY_SURGERY_CENTER): Payer: 59 | Admitting: Gastroenterology

## 2020-08-05 VITALS — BP 105/65 | HR 56 | Temp 97.1°F | Resp 13 | Ht <= 58 in | Wt 158.0 lb

## 2020-08-05 DIAGNOSIS — K621 Rectal polyp: Secondary | ICD-10-CM | POA: Diagnosis not present

## 2020-08-05 DIAGNOSIS — R195 Other fecal abnormalities: Secondary | ICD-10-CM

## 2020-08-05 DIAGNOSIS — K6289 Other specified diseases of anus and rectum: Secondary | ICD-10-CM | POA: Diagnosis not present

## 2020-08-05 DIAGNOSIS — K640 First degree hemorrhoids: Secondary | ICD-10-CM | POA: Diagnosis not present

## 2020-08-05 DIAGNOSIS — K6389 Other specified diseases of intestine: Secondary | ICD-10-CM

## 2020-08-05 DIAGNOSIS — Z9884 Bariatric surgery status: Secondary | ICD-10-CM

## 2020-08-05 DIAGNOSIS — Z800 Family history of malignant neoplasm of digestive organs: Secondary | ICD-10-CM

## 2020-08-05 DIAGNOSIS — D128 Benign neoplasm of rectum: Secondary | ICD-10-CM

## 2020-08-05 DIAGNOSIS — D509 Iron deficiency anemia, unspecified: Secondary | ICD-10-CM | POA: Diagnosis present

## 2020-08-05 MED ORDER — SODIUM CHLORIDE 0.9 % IV SOLN
500.0000 mL | Freq: Once | INTRAVENOUS | Status: DC
Start: 2020-08-05 — End: 2023-03-26

## 2020-08-05 NOTE — Progress Notes (Signed)
Called to room to assist during endoscopic procedure.  Patient ID and intended procedure confirmed with present staff. Received instructions for my participation in the procedure from the performing physician.  

## 2020-08-05 NOTE — Patient Instructions (Signed)
Impression/Recommendations:  Polyp and hemorrhoid handouts given to patient.  Resume previous diet. Continue present medications. Await pathology results.  Repeat colonoscopy in 5 years for screening.  Return to GI office at appointment to be scheduled.  YOU HAD AN ENDOSCOPIC PROCEDURE TODAY AT [facility]:   Refer to the procedure report that was given to you for any specific questions about what was found during the examination.  If the procedure report does not answer your questions, please call your gastroenterologist to clarify.  If you requested that your care partner not be given the details of your procedure findings, then the procedure report has been included in a sealed envelope for you to review at your convenience later.  YOU SHOULD EXPECT: Some feelings of bloating in the abdomen. Passage of more gas than usual.  Walking can help get rid of the air that was put into your GI tract during the procedure and reduce the bloating. If you had a lower endoscopy (such as a colonoscopy or flexible sigmoidoscopy) you may notice spotting of blood in your stool or on the toilet paper. If you underwent a bowel prep for your procedure, you may not have a normal bowel movement for a few days.  Please Note:  You might notice some irritation and congestion in your nose or some drainage.  This is from the oxygen used during your procedure.  There is no need for concern and it should clear up in a day or so.  SYMPTOMS TO REPORT IMMEDIATELY:   Following lower endoscopy (colonoscopy or flexible sigmoidoscopy):  Excessive amounts of blood in the stool  Significant tenderness or worsening of abdominal pains  Swelling of the abdomen that is new, acute  Fever of 100F or higher   Following upper endoscopy (EGD)  Vomiting of blood or coffee ground material  New chest pain or pain under the shoulder blades  Painful or persistently difficult swallowing  New shortness of  breath  Fever of 100F or higher  Black, tarry-looking stools  For urgent or emergent issues, a gastroenterologist can be reached at any hour by calling [phone]. Do not use MyChart messaging for urgent concerns.    DIET:  We do recommend a small meal at first, but then you may proceed to your regular diet.  Drink plenty of fluids but you should avoid alcoholic beverages for 24 hours.  ACTIVITY:  You should plan to take it easy for the rest of today and you should NOT DRIVE or use heavy machinery until tomorrow (because of the sedation medicines used during the test).    FOLLOW UP: Our staff will call the number listed on your records 48-72 hours following your procedure to check on you and address any questions or concerns that you may have regarding the information given to you following your procedure. If we do not reach you, we will leave a message.  We will attempt to reach you two times.  During this call, we will ask if you have developed any symptoms of COVID 19. If you develop any symptoms (ie: fever, flu-like symptoms, shortness of breath, cough etc.) before then, please call [phone].  If you test positive for Covid 19 in the 2 weeks post procedure, please call and report this information to [place].    If any biopsies were taken you will be contacted by phone or by letter within the next 1-3 weeks.  Please call us at [phone] if you have not heard about the  biopsies in 3 weeks.    SIGNATURES/CONFIDENTIALITY: You and/or your care partner have signed paperwork which will be entered into your electronic medical record.  These signatures attest to the fact that that the information above on your After Visit Summary has been reviewed and is understood.  Full responsibility of the confidentiality of this discharge information lies with you and/or your care-partner.

## 2020-08-05 NOTE — Op Note (Signed)
[place] Patient Name: Julie Fischer Procedure Date: 08/05/2020 1:34 PM MRN: [id number] Endoscopist: Gerrit Heck , MD Age: 60 Referring MD:  Date of Birth: July 17, 1960 Gender: Female Account #: [id number] Procedure:                Colonoscopy Indications:              Heme positive stool, Iron deficiency anemia                           Family history notable for mother with colon cancer. Medicines:                Monitored Anesthesia Care Procedure:                Pre-Anesthesia Assessment:                           - Prior to the procedure, a History and Physical                            was performed, and patient medications and                            allergies were reviewed. The patient's tolerance of                            previous anesthesia was also reviewed. The risks                            and benefits of the procedure and the sedation                            options and risks were discussed with the patient.                            All questions were answered, and informed consent                            was obtained. Prior Anticoagulants: The patient has                            taken no previous anticoagulant or antiplatelet                            agents. ASA Grade Assessment: II - A patient with                            mild systemic disease. After reviewing the risks                            and benefits, the patient was deemed in                            satisfactory condition to undergo the procedure.  After obtaining informed consent, the colonoscope                            was passed under direct vision. Throughout the                            procedure, the patient's blood pressure, pulse, and                            oxygen saturations were monitored continuously. The                            Olympus CF-HQ190 (#[id number]) Colonoscope was                            introduced  through the anus and advanced to the the                            terminal ileum. The colonoscopy was performed                            without difficulty. The patient tolerated the                            procedure well. The quality of the bowel                            preparation was good. The terminal ileum, ileocecal                            valve, appendiceal orifice, and rectum were                            photographed. Scope In: 1:54:32 PM Scope Out: 2:06:36 PM Scope Withdrawal Time: 0 hours 9 minutes 15 seconds  Total Procedure Duration: 0 hours 12 minutes 4 seconds  Findings:                 The perianal and digital rectal examinations were                            normal.                           A 2 mm polyp was found in the rectum. The polyp was                            sessile. The polyp was removed with a cold biopsy                            forceps. Resection and retrieval were complete.                            Estimated blood loss was minimal.  There was a small lipoma, in the ascending colon.                           The exam was otherwise normal throughout the                            remainder of the colon.                           Non-bleeding internal hemorrhoids were found during                            retroflexion. The hemorrhoids were small and Grade                            II (internal hemorrhoids that prolapse but reduce                            spontaneously).                           The terminal ileum appeared normal. Complications:            No immediate complications. Estimated Blood Loss:     Estimated blood loss was minimal. Impression:               - One 2 mm polyp in the rectum, removed with a cold                            biopsy forceps. Resected and retrieved.                           - Small lipoma in the ascending colon.                           - Non-bleeding internal  hemorrhoids.                           - The examined portion of the ileum was normal. Recommendation:           - Patient has a contact number available for                            emergencies. The signs and symptoms of potential                            delayed complications were discussed with the                            patient. Return to normal activities tomorrow.                            Written discharge instructions were provided to the                            patient.                           -  Resume previous diet.                           - Continue present medications.                           - Await pathology results.                           - Repeat colonoscopy in 5 years for screening                            purposes due to family history of colon cancer.                           - Return to GI office at appointment to be                            scheduled.                           - Internal hemorrhoids were noted on this study and                            may be amenable to hemorrhoid band ligation. If you                            are interested in further treatment of these                            hemorrhoids with band ligation, please contact my                            clinic to set up an appointment for evaluation and                            treatment. Gerrit Heck, MD 08/05/2020 2:25:21 PM

## 2020-08-05 NOTE — Op Note (Signed)
[facility] Patient Name: Demetrius Barrell Procedure Date: 08/05/2020 1:35 PM MRN: [id number] Endoscopist: Gerrit Heck , MD Age: 60 Referring MD:  Date of Birth: 09-14-1960 Gender: Female Account #: [id number] Procedure:                Upper GI endoscopy Indications:              Iron deficiency anemia, Heme positive stool Medicines:                Monitored Anesthesia Care Procedure:                Pre-Anesthesia Assessment:                           - Prior to the procedure, a History and Physical                            was performed, and patient medications and                            allergies were reviewed. The patient's tolerance of                            previous anesthesia was also reviewed. The risks                            and benefits of the procedure and the sedation                            options and risks were discussed with the patient.                            All questions were answered, and informed consent                            was obtained. Prior Anticoagulants: The patient has                            taken no previous anticoagulant or antiplatelet                            agents. ASA Grade Assessment: II - A patient with                            mild systemic disease. After reviewing the risks                            and benefits, the patient was deemed in                            satisfactory condition to undergo the procedure.                           After obtaining informed consent, the endoscope was  passed under direct vision. Throughout the                            procedure, the patient's blood pressure, pulse, and                            oxygen saturations were monitored continuously. The                            Endoscope was introduced through the mouth, and                            advanced to the jejunum. The upper GI endoscopy was                             accomplished without difficulty. The patient                            tolerated the procedure well. Scope In: Scope Out: Findings:                 The examined esophagus was normal.                           Evidence of a Roux-en-Y gastrojejunostomy was                            found. The gastrojejunal anastomosis was                            characterized by healthy appearing mucosa. This was                            traversed. The gastric pouch was otherwise normal                            appearing. Biopsies were taken with a cold forceps                            for histology. Estimated blood loss was minimal.                           The examined jejunum was normal. This was biopsied                            with a cold forceps for histology. Estimated blood                            loss was minimal. The blind loop was normal                            appearing and measured approximately 3 cm in length. Complications:            No immediate complications. Estimated Blood Loss:     Estimated blood loss was minimal.  Impression:               - Normal esophagus.                           - Roux-en-Y gastrojejunostomy with gastrojejunal                            anastomosis characterized by healthy appearing                            mucosa. Biopsies taken from the gastric pouch.                           - Appropriate length blind loop with normal                            appearing mucosa.                           - Normal examined jejunum. Biopsied. Recommendation:           - Patient has a contact number available for                            emergencies. The signs and symptoms of potential                            delayed complications were discussed with the                            patient. Return to normal activities tomorrow.                            Written discharge instructions were provided to the                            patient.                            - Resume previous diet.                           - Continue present medications.                           - Await pathology results.                           - Perform a colonoscopy today.                           - If path and colonsocopy unrevealing, and if                            ongoing IDA, may consider endoscopic placement of  VCE for further small bowel interrogation. Gerrit Heck, MD 08/05/2020 2:20:45 PM

## 2020-08-05 NOTE — Progress Notes (Signed)
Report given to PACU, vss 

## 2020-08-05 NOTE — Progress Notes (Signed)
Medical history reviewed with no changes since PV. VS assessed by C.W 

## 2020-08-09 ENCOUNTER — Telehealth: Payer: Self-pay

## 2020-08-09 NOTE — Telephone Encounter (Signed)
  Follow up Call-  Call back number 08/05/2020  Post procedure Call Back phone  # [phone]  Permission to leave phone message Yes  Some recent data might be hidden     Patient questions:  Do you have a fever, pain , or abdominal swelling? No. Pain Score  0 *  Have you tolerated food without any problems? Yes.    Have you been able to return to your normal activities? Yes.    Do you have any questions about your discharge instructions: Diet   No. Medications  No. Follow up visit  No.  Do you have questions or concerns about your Care? No.  Actions: * If pain score is 4 or above: No action needed, pain <4. 1. Have you developed a fever since your procedure? no  2.   Have you had an respiratory symptoms (SOB or cough) since your procedure? no  3.   Have you tested positive for COVID 19 since your procedure no  4.   Have you had any family members/close contacts diagnosed with the COVID 19 since your procedure?  no   If yes to any of these questions please route to Joylene John, RN and Joella Prince, RN

## 2020-08-09 NOTE — Telephone Encounter (Signed)
Per 08/05/20 procedure note - Return to GI clinic at appt to be scheduled  Patient has been scheduled for a follow up on Wednesday, 08/24/20 at 10:40 AM. Sent via My Chart and letter mailed to patient with appointment information.

## 2020-08-09 NOTE — Telephone Encounter (Signed)
First post procedure follow up call, no answer 

## 2020-08-18 ENCOUNTER — Encounter: Payer: Self-pay | Admitting: Gastroenterology

## 2020-08-22 ENCOUNTER — Other Ambulatory Visit (INDEPENDENT_AMBULATORY_CARE_PROVIDER_SITE_OTHER): Payer: 59

## 2020-08-22 DIAGNOSIS — D509 Iron deficiency anemia, unspecified: Secondary | ICD-10-CM | POA: Diagnosis not present

## 2020-08-22 DIAGNOSIS — R195 Other fecal abnormalities: Secondary | ICD-10-CM

## 2020-08-22 DIAGNOSIS — Z9884 Bariatric surgery status: Secondary | ICD-10-CM | POA: Diagnosis not present

## 2020-08-22 DIAGNOSIS — Z800 Family history of malignant neoplasm of digestive organs: Secondary | ICD-10-CM | POA: Diagnosis not present

## 2020-08-22 LAB — CBC WITH DIFFERENTIAL/PLATELET
Basophils Absolute: 0 10*3/uL (ref 0.0–0.1)
Basophils Relative: 0.5 % (ref 0.0–3.0)
Eosinophils Absolute: 0.3 10*3/uL (ref 0.0–0.7)
Eosinophils Relative: 3.8 % (ref 0.0–5.0)
HCT: 36.3 % (ref 36.0–46.0)
Hemoglobin: 11.7 g/dL — ABNORMAL LOW (ref 12.0–15.0)
Lymphocytes Relative: 35.3 % (ref 12.0–46.0)
Lymphs Abs: 3 10*3/uL (ref 0.7–4.0)
MCHC: 32.4 g/dL (ref 30.0–36.0)
MCV: 82.8 fl (ref 78.0–100.0)
Monocytes Absolute: 0.5 10*3/uL (ref 0.1–1.0)
Monocytes Relative: 5.7 % (ref 3.0–12.0)
Neutro Abs: 4.6 10*3/uL (ref 1.4–7.7)
Neutrophils Relative %: 54.7 % (ref 43.0–77.0)
Platelets: 272 10*3/uL (ref 150.0–400.0)
RBC: 4.38 Mil/uL (ref 3.87–5.11)
RDW: 16.9 % — ABNORMAL HIGH (ref 11.5–15.5)
WBC: 8.4 10*3/uL (ref 4.0–10.5)

## 2020-08-22 LAB — IBC + FERRITIN
Ferritin: 8 ng/mL — ABNORMAL LOW (ref 10.0–291.0)
Iron: 100 ug/dL (ref 42–145)
Saturation Ratios: 22.3 % (ref 20.0–50.0)
Transferrin: 320 mg/dL (ref 212.0–360.0)

## 2020-08-24 ENCOUNTER — Ambulatory Visit (INDEPENDENT_AMBULATORY_CARE_PROVIDER_SITE_OTHER): Payer: 59 | Admitting: Gastroenterology

## 2020-08-24 ENCOUNTER — Encounter: Payer: Self-pay | Admitting: Gastroenterology

## 2020-08-24 ENCOUNTER — Other Ambulatory Visit: Payer: Self-pay

## 2020-08-24 VITALS — BP 118/72 | HR 91 | Ht <= 58 in | Wt 153.5 lb

## 2020-08-24 DIAGNOSIS — D509 Iron deficiency anemia, unspecified: Secondary | ICD-10-CM | POA: Diagnosis not present

## 2020-08-24 DIAGNOSIS — Z9884 Bariatric surgery status: Secondary | ICD-10-CM | POA: Diagnosis not present

## 2020-08-24 DIAGNOSIS — Z86010 Personal history of colonic polyps: Secondary | ICD-10-CM

## 2020-08-24 DIAGNOSIS — Z800 Family history of malignant neoplasm of digestive organs: Secondary | ICD-10-CM | POA: Diagnosis not present

## 2020-08-24 NOTE — Progress Notes (Signed)
P  Chief Complaint:    Procedure follow-up, iron deficiency anemia  GI History: 60 year old female with a history ofRYGB approximately 2 years ago, breast cancer, diabetes, OSA, HTN, osteopenia, left total knee replacement.  She was seen in GI office in 07/2020 for evaluation of IDA and FOBT positive stool noted during routine physical in 06/2020.  Was taking Aleve, which was discontinued.  -06/2020: H/H 10.8/34.5, MCV/RDW 83/14.7.  B12 >2000.  Ferritin 10, iron 46.  Was started on ferrous sulfate 140 mg daily. -08/22/2020: H/H 11.7/36.3, MCV/RDW 82.8/60.9.  Ferritin 8, iron 100, sat 22.3%  History notable for mother with colon cancer.    Endoscopic History: -Colonoscopy (12/2010): Normal -Colonoscopy (06/2017): Normal -EGD (07/2020, Dr. Bryan Lemma): Normal-appearing RYGB anatomy, normal small bowel (path benign) -Colonoscopy (07/2020, Dr. Bryan Lemma): 2 mm rectal hyperplastic polyp, small ascending colon lipoma, grade 2 hemorrhoids.  Normal TI.  Repeat in 5 years due to family history   HPI:     Patient is a 60 y.o. female presenting to the Gastroenterology Clinic for follow-up. She had increased iron from QD to BID last month.   She is otherwise feeling well.  No active symptoms or complaints today.  Improved hemoglobin and iron, but essentially static ferritin on repeat panel earlier this week, as outlined above.  Review of systems:     No chest pain, no SOB, no fevers, no urinary sx   Past Medical History:  Diagnosis Date  . Allergy    fall seasonal  . Anxiety   . Blood transfusion    Platelet transfusion when on heparin  . Breast cancer ([place]) 2001   R breast(lumpectomy,chemo,radiation,tamoxifen)  . C. difficile diarrhea 02/2016   treated with Flagyl  . Complication of anesthesia    gets Jillyn Ledger going under she has been told  . Depression    treated  . Diabetes mellitus    type 2  . GERD (gastroesophageal reflux disease)   . Hyperlipidemia   . Hypertension    resolved  after weight loss surgery; recurred 2013  . Insomnia chronic  . Iron deficiency    h/o  . Microalbuminuria    h/o  . OA (osteoarthritis) of knee   . Osteoporosis   . Personal history of chemotherapy   . Personal history of radiation therapy   . Plantar fasciitis (07') [facility]   s/p surgical release 01/2011  . Sleep apnea    resolved after weight loss surgery; recurrent with weight gain; on CPAP  . Vitamin D deficiency     Patient's surgical history, family medical history, social history, medications and allergies were all reviewed in Epic    Current Outpatient Medications  Medication Sig Dispense Refill  . acetaminophen (TYLENOL) 500 MG tablet Take 1,500 mg by mouth every 6 (six) hours as needed for moderate pain or headache.    . Alpha-D-Galactosidase (BEANO PO) Take 1 tablet by mouth daily.    . Calcium Carb-Cholecalciferol (CALCIUM 600 + D PO) Take 1 tablet by mouth 2 (two) times daily.    . cetirizine (ZYRTEC) 10 MG tablet Take 10 mg by mouth daily.    . Dulaglutide (TRULICITY) 7.68 TL/5.7WI SOPN Inject 0.75 mg into the skin every 7 (seven) days. 6 mL 0  . Ferrous Sulfate 140 (45 Fe) MG TBCR Take 1 tablet by mouth 2 (two) times daily.    Marland Kitchen lisinopril (ZESTRIL) 10 MG tablet TAKE 1 TABLET BY MOUTH EVERY DAY 90 tablet 0  . LYSINE PO Take 1 tablet by  mouth daily as needed (cold sore).    . metFORMIN (GLUCOPHAGE-XR) 500 MG 24 hr tablet Take 2 tablets (1,000 mg total) by mouth daily. 180 tablet 1  . Multiple Vitamin (MULTIVITAMIN WITH MINERALS) TABS tablet Take 1 tablet by mouth 2 (two) times daily.    . Multiple Vitamins-Minerals (HAIR SKIN AND NAILS FORMULA PO) Take 1 tablet by mouth 2 (two) times daily.    . naproxen sodium (ALEVE) 220 MG tablet Take 440 mg by mouth daily as needed.    . Omega-3 1000 MG CAPS Take 1,000 mg by mouth 2 (two) times daily.    Marland Kitchen omeprazole (PRILOSEC OTC) 20 MG tablet Take 1 capsule up to twice daily for the next 1-2 weeks 28 tablet 0  . PARoxetine  (PAXIL) 20 MG tablet TAKE 1 TABLET BY MOUTH EVERY DAY 90 tablet 0  . simethicone (MYLICON) 539 MG chewable tablet Chew 125 mg by mouth every 6 (six) hours as needed for flatulence.    . simvastatin (ZOCOR) 20 MG tablet TAKE 1 TABLET BY MOUTH EVERYDAY AT BEDTIME 90 tablet 1  . zolpidem (AMBIEN CR) 12.5 MG CR tablet Take 1 tablet (12.5 mg total) by mouth at bedtime. 90 tablet 0   Current Facility-Administered Medications  Medication Dose Route Frequency Provider Last Rate Last Admin  . 0.9 %  sodium chloride infusion  500 mL Intravenous Once Milus Banister, MD      . 0.9 %  sodium chloride infusion  500 mL Intravenous Once Mackenzy Grumbine V, DO        Physical Exam:     BP 118/72   Pulse 91   Ht 4\' 9"  (1.448 m)   Wt 153 lb 8 oz (69.6 kg)   BMI 33.22 kg/m   GENERAL:  Pleasant female in NAD PSYCH: : Cooperative, normal affect NEURO: Alert and oriented x 3, no focal neurologic deficits   IMPRESSION and PLAN:    1) Iron deficiency anemia 2) History of Roux-en-Y gastric bypass -Uptrending hemoglobin and iron since starting bid oral iron therapy.  Ferritin still low, but could be due to continued high marrow output.  Discussed further evaluation with VCE vs more conservative approach with repeat labs in 3 months, and she strongly prefers the latter -Continue oral iron as currently prescribed -Repeat CBC and iron panel in 3 months -If repeat panel demonstrates normalization, plan to stop oral iron and repeat again 3 months later.  If still normal, annual surveillance would be appropriate -If repeat panel still demonstrates low ferritin, or if subsequent testing at 15-month point (off iron at that point) again shows return of IDA, low threshold for VCE for further small bowel interrogation  3) History of colon polyp 4) Family history of colon cancer -Repeat colonoscopy 2027 for ongoing polyp surveillance/increase for screening  -RTC in 3 months or sooner as needed          Lavena Bullion ,DO, FACG 08/24/2020, 10:51 AM

## 2020-08-24 NOTE — Patient Instructions (Signed)
If you are age 62 or older, your body mass index should be between 23-30. Your Body mass index is 33.22 kg/m. If this is out of the aforementioned range listed, please consider follow up with your Primary Care Provider.  If you are age 63 or younger, your body mass index should be between 19-25. Your Body mass index is 33.22 kg/m. If this is out of the aformentioned range listed, please consider follow up with your Primary Care Provider.   Please go to the 2nd floor of this building  and schedule your labwork, after 11/23/2020.  [address], [address].  Return to the clinic in 3 months,call to schedule your appointment in June for July please schedule the appointment atleast a week after your labs are drawn.  Due to recent changes in healthcare laws, you may see the results of your imaging and laboratory studies on MyChart before your provider has had a chance to review them.  We understand that in some cases there may be results that are confusing or concerning to you. Not all laboratory results come back in the same time frame and the provider may be waiting for multiple results in order to interpret others.  Please give [place] 48 hours in order for your provider to thoroughly review all the results before contacting the office for clarification of your results.   Thank you for choosing me and [facility].  Vito Cirigliano, D.O.

## 2020-08-29 ENCOUNTER — Encounter: Payer: Self-pay | Admitting: Family Medicine

## 2020-08-29 ENCOUNTER — Telehealth (INDEPENDENT_AMBULATORY_CARE_PROVIDER_SITE_OTHER): Payer: 59 | Admitting: Family Medicine

## 2020-08-29 VITALS — Temp 98.9°F | Ht <= 58 in | Wt 146.0 lb

## 2020-08-29 DIAGNOSIS — J069 Acute upper respiratory infection, unspecified: Secondary | ICD-10-CM

## 2020-08-29 DIAGNOSIS — E119 Type 2 diabetes mellitus without complications: Secondary | ICD-10-CM

## 2020-08-29 DIAGNOSIS — R059 Cough, unspecified: Secondary | ICD-10-CM

## 2020-08-29 MED ORDER — AZITHROMYCIN 250 MG PO TABS: ORAL_TABLET | ORAL | 0 refills | Status: DC

## 2020-08-29 MED ORDER — BENZONATATE 200 MG PO CAPS: 200.0000 mg | ORAL_CAPSULE | Freq: Three times a day (TID) | ORAL | 0 refills | Status: DC | PRN

## 2020-08-29 NOTE — Patient Instructions (Signed)
  Drink plenty of water. Take the z-pak as directed.  Remember that it works for 5 days after taking the last pill. Use the benzonatate perles up to three times daily if needed for cough.We discussed that this medication is documented in your chart as causing diarrhea in the past.  If this isn't tolerable, then please stop the benzonatate.  Use decongestants (phenylephrine) carefully--you should monitor your blood pressure while taking, and if elevated, stop the Dayquil and Nyquil. Instead use Coricidin products. You are taking too much dextromethorphan--found in all three medicines you're taking.  Switch to a plain guaifenesin (mucinex, plain robitussin). Use the guaifenesin as directed on package (some are every 4 hours), to help loosen up the mucus.  Follow up in office if worsening symptoms--ongoing fevers, worsening cough, shortness of breath, swelling, coughing up blood.

## 2020-08-29 NOTE — Progress Notes (Signed)
Start time: 3:16 End time: 3:38  Virtual Visit via Video Note  I connected with Julie Fischer on 08/29/20 by a video enabled telemedicine application and verified that I am speaking with the correct person using two identifiers.  Location: Patient: home Provider: office   I discussed the limitations of evaluation and management by telemedicine and the availability of in person appointments. The patient expressed understanding and agreed to proceed.  History of Present Illness:  Chief Complaint  Patient presents with  . Sore Throat    VIRTUAL coughing, ST, body aches, fever and chills. Symptoms started last Tuesday. Home covid test today was negative.    6-7 days ago she started with "snotty nose", slight cough.  By the end of the week her nose and cough were worse, she developed fever (low grade, 99-100).   Today is she complaining of only wanting to sleep. She is still coughing up phlegm, now brown-yellow, sometimes green phlegm, productive all day long. Nasal drainage is bloody, brown. Some sinus pressure, "not horrible". +chest pressure and congestion. Phlegm is discolored, but not too thick.  Taking dayquil (in morning and afternoon), nyquil (at night), and robitussin DM (liquid, using it twice a day).  Took a negative COVID test today. No sick contacts.  PMH, PSH, SH reviewed  Outpatient Encounter Medications as of 08/29/2020  Medication Sig Note  . acetaminophen (TYLENOL) 500 MG tablet Take 1,500 mg by mouth every 6 (six) hours as needed for moderate pain or headache. 08/29/2020: Last dose 1pm  . Alpha-D-Galactosidase (BEANO PO) Take 1 tablet by mouth daily.   . Calcium Carb-Cholecalciferol (CALCIUM 600 + D PO) Take 1 tablet by mouth 2 (two) times daily.   . cetirizine (ZYRTEC) 10 MG tablet Take 10 mg by mouth daily.   . Dulaglutide (TRULICITY) 1.60 VP/7.1GG SOPN Inject 0.75 mg into the skin every 7 (seven) days.   . Ferrous Sulfate 140 (45 Fe) MG TBCR Take 1 tablet by  mouth 2 (two) times daily.   Marland Kitchen guaiFENesin-dextromethorphan (ROBITUSSIN DM) 100-10 MG/5ML syrup Take 15 mLs by mouth every 4 (four) hours as needed for cough. 08/29/2020: Twice daily  . lisinopril (ZESTRIL) 10 MG tablet TAKE 1 TABLET BY MOUTH EVERY DAY   . metFORMIN (GLUCOPHAGE-XR) 500 MG 24 hr tablet Take 2 tablets (1,000 mg total) by mouth daily.   . Multiple Vitamin (MULTIVITAMIN WITH MINERALS) TABS tablet Take 1 tablet by mouth 2 (two) times daily.   . Multiple Vitamins-Minerals (HAIR SKIN AND NAILS FORMULA PO) Take 1 tablet by mouth 2 (two) times daily.   . Omega-3 1000 MG CAPS Take 1,000 mg by mouth 2 (two) times daily.   Marland Kitchen PARoxetine (PAXIL) 20 MG tablet TAKE 1 TABLET BY MOUTH EVERY DAY   . Pseudoeph-Doxylamine-DM-APAP (NYQUIL PO) Take 2 capsules by mouth daily.   . Pseudoephedrine-APAP-DM (DAYQUIL PO) Take by mouth.   . simvastatin (ZOCOR) 20 MG tablet TAKE 1 TABLET BY MOUTH EVERYDAY AT BEDTIME   . zolpidem (AMBIEN CR) 12.5 MG CR tablet Take 1 tablet (12.5 mg total) by mouth at bedtime.   Marland Kitchen LYSINE PO Take 1 tablet by mouth daily as needed (cold sore). (Patient not taking: Reported on 08/29/2020)   . naproxen sodium (ALEVE) 220 MG tablet Take 440 mg by mouth daily as needed. (Patient not taking: Reported on 08/29/2020) 06/09/2020: Takes 2 once daily in the morning due to her recent hip pain  . omeprazole (PRILOSEC OTC) 20 MG tablet Take 1 capsule up to twice  daily for the next 1-2 weeks (Patient not taking: Reported on 08/29/2020) 06/09/2020: Uses prn, about once a week  . simethicone (MYLICON) 287 MG chewable tablet Chew 125 mg by mouth every 6 (six) hours as needed for flatulence. (Patient not taking: Reported on 08/29/2020)    Facility-Administered Encounter Medications as of 08/29/2020  Medication  . 0.9 %  sodium chloride infusion  . 0.9 %  sodium chloride infusion   Allergies  Allergen Reactions  . Heparin Other (See Comments)    Low platelets  . Niacin And Related Other (See Comments)     Blotchy/itchy  . Tessalon [Benzonatate] Diarrhea  . Penicillins Rash and Other (See Comments)    Has patient had a PCN reaction causing immediate rash, facial/tongue/throat swelling, SOB or lightheadedness with hypotension: Unknown Has patient had a PCN reaction causing severe rash involving mucus membranes or skin necrosis: Unknown Has patient had a PCN reaction that required hospitalization: No Has patient had a PCN reaction occurring within the last 10 years: No If all of the above answers are "NO", then may proceed with Cephalosporin use.    ROS: fever, URI symptoms per HPI.  +fatigue.  No n/v/d, bleeding, rash or other complaints, except as noted in HPI.    Observations/Objective:  Temp 98.9 F (37.2 C) (Oral)   Ht 4\' 9"  (1.448 m)   Wt 146 lb (66.2 kg)   BMI 31.59 kg/m   Female laying in bed, appears tired, with intermittent dry, hacky cough. She is speaking easily, in no distress. She is alert and oriented, cranial nerves grossly intact. Exam is limited due to virtual nature of the visit.  Assessment and Plan:  Upper respiratory tract infection, unspecified type - worsening/progressing, in a diabetic, with purulent drainage. Will cover for sinus infection with ABX. f/u if worsening - Plan: azithromycin (ZITHROMAX) 250 MG tablet  Type 2 diabetes mellitus without complication, without long-term current use of insulin (HCC)  Cough - there is a documented allergy to tessalon, SE of diarrhea.  Pt is requesting to try this again, despite documentation of SE. - Plan: azithromycin (ZITHROMAX) 250 MG tablet, benzonatate (TESSALON) 200 MG capsule   z-pak Tessalon perles--allergy listed, diarrhea, pt insists she wants to try again.  Drink plenty of water. Take the z-pak as directed.  Remember that it works for 5 days after taking the last pill. Use the benzonatate perles up to three times daily if needed for cough. We discussed that this medication is documented in your chart  as causing diarrhea in the past.  If this isn't tolerable, then please stop the benzonatate.  Use decongestants (phenylephrine) carefully--you should monitor your blood pressure while taking, and if elevated, stop the Dayquil and Nyquil. Instead use Coricidin products. You are taking too much dextromethorphan--found in all three medicines you're taking.  Switch to a plain guaifenesin (mucinex, plain robitussin). Use the guaifenesin as directed on package (some are every 4 hours), to help loosen up the mucus.  Follow up in office if worsening symptoms--ongoing fevers, worsening cough, shortness of breath, swelling, coughing up blood.   Follow Up Instructions:    I discussed the assessment and treatment plan with the patient. The patient was provided an opportunity to ask questions and all were answered. The patient agreed with the plan and demonstrated an understanding of the instructions.   The patient was advised to call back or seek an in-person evaluation if the symptoms worsen or if the condition fails to improve as anticipated.  I  spent 23 minutes dedicated to the care of this patient, including pre-visit review of records, face to face time, post-visit ordering of testing and documentation.    Vikki Ports, MD

## 2020-08-31 ENCOUNTER — Telehealth: Payer: Self-pay | Admitting: Family Medicine

## 2020-08-31 ENCOUNTER — Ambulatory Visit (INDEPENDENT_AMBULATORY_CARE_PROVIDER_SITE_OTHER): Payer: 59 | Admitting: Family Medicine

## 2020-08-31 ENCOUNTER — Other Ambulatory Visit: Payer: Self-pay

## 2020-08-31 ENCOUNTER — Encounter: Payer: Self-pay | Admitting: Family Medicine

## 2020-08-31 VITALS — BP 120/70 | HR 84 | Temp 99.0°F | Ht <= 58 in | Wt 148.0 lb

## 2020-08-31 DIAGNOSIS — R309 Painful micturition, unspecified: Secondary | ICD-10-CM | POA: Diagnosis not present

## 2020-08-31 DIAGNOSIS — J069 Acute upper respiratory infection, unspecified: Secondary | ICD-10-CM | POA: Diagnosis not present

## 2020-08-31 DIAGNOSIS — N3001 Acute cystitis with hematuria: Secondary | ICD-10-CM | POA: Diagnosis not present

## 2020-08-31 LAB — POCT URINALYSIS DIP (PROADVANTAGE DEVICE)
Bilirubin, UA: NEGATIVE
Glucose, UA: NEGATIVE mg/dL
Ketones, POC UA: NEGATIVE mg/dL
Nitrite, UA: POSITIVE — AB
Protein Ur, POC: 100 mg/dL — AB
Specific Gravity, Urine: 1.02
Urobilinogen, Ur: NEGATIVE
pH, UA: 6 (ref 5.0–8.0)

## 2020-08-31 MED ORDER — SULFAMETHOXAZOLE-TRIMETHOPRIM 800-160 MG PO TABS: 1.0000 | ORAL_TABLET | Freq: Two times a day (BID) | ORAL | 0 refills | Status: DC

## 2020-08-31 NOTE — Patient Instructions (Signed)
Complete the course of azithromycin, since it seems to have helped just a little, so far. Take the new antibiotic twice daily for a week. Let us know if you have problems tolerating it.  Stay well hydrated.  Fever, chills, vomiting, flank pain are signs of worsening infection, and will need re-evaluation.

## 2020-08-31 NOTE — Telephone Encounter (Signed)
Pt called and states that she now has blood in her urine. Please advise pt at [phone].

## 2020-08-31 NOTE — Progress Notes (Signed)
Chief Complaint  Patient presents with  . Dysuria    Pain with urination that started this morning, some increased frequency as well.    Noticed blood in the urine this morning.  Urine was pink, saw blood on TP.  It was not painful initially. Now is having pain with urination, urgency and frequency.  Some pressure at her R lower abdomen/pelvis started today. No flank pain  No h/o stones or UTI's in the past.  Has been having fever all week. She was seen earlier in the week with sinus infection. She is still having coughing spells.  Still getting blood from nose, but mucus is a little clearer than it had been. Cough is nonproductive.  PMH, PSH, SH reviewed  Allergies  Allergen Reactions  . Heparin Other (See Comments)    Low platelets  . Niacin And Related Other (See Comments)    Blotchy/itchy  . Tessalon [Benzonatate] Diarrhea  . Penicillins Rash and Other (See Comments)    Has patient had a PCN reaction causing immediate rash, facial/tongue/throat swelling, SOB or lightheadedness with hypotension: Unknown Has patient had a PCN reaction causing severe rash involving mucus membranes or skin necrosis: Unknown Has patient had a PCN reaction that required hospitalization: No Has patient had a PCN reaction occurring within the last 10 years: No If all of the above answers are "NO", then may proceed with Cephalosporin use.    ROS:  Fever, URI and UTI symptoms as per HPI. No n/v. She has some diarrhea from the tessalon and/or antibiotics. No vaginal discharge or itch   PHYSICAL EXAM:  BP 120/70   Pulse 84   Temp 99 F (37.2 C)   Ht 4\' 9"  (1.448 m)   Wt 148 lb (67.1 kg)   BMI 32.03 kg/m   Pleasant, well-appearing female in no distress Speaking easily, no coughing during visit. HEENT: conjunctiva and sclera are clear, EOMI sinuses are nontender Neck: no lymphadenopathy or mass Heart: regular rate and rhythm Lungs: clear bilaterally Abdomen: Mild tenderness at lower  abdomen, R and L side, not centrally/suprapubic Back: No CVA tenderness  Urine: 3+ blood, 3+ leuks, 2+ prot, SG 1.020  ASSESSMENT/PLAN:   Acute cystitis with hematuria - doubt UTI would be covered by the zpak she is on.  Will sent for culture and treat with Bactrim DS.  Risks/SE reviewed - Plan: Urine Culture, sulfamethoxazole-trimethoprim (BACTRIM DS) 800-160 MG tablet  Pain with urination - Plan: POCT Urinalysis DIP (Proadvantage Device)  Upper respiratory tract infection, unspecified type - starting to improve with antibiotics. Complete zpak. cont supportive measures  Pt to f/u if urinary symptoms persist or worsen. Will notify of culture results via [facility] (likely will come in over [place] weekend)  Complete the course of azithromycin, since it seems to have helped just a little, so far. Take the new antibiotic twice daily for a week. Let us know if you have problems tolerating it.  Stay well hydrated.  Fever, chills, vomiting, flank pain are signs of worsening infection, and will need re-evaluation.

## 2020-08-31 NOTE — Telephone Encounter (Signed)
Recent visit was for sinus infection. No urinary complaints. This is a separate issue (unless there's some connection I don't know about!) If having urinary symptoms, needs OV. Can see her in the 2:30 slot today.  Can be in office (She had taken negative COVID test)

## 2020-09-03 LAB — URINE CULTURE

## 2020-09-18 DIAGNOSIS — U071 COVID-19: Secondary | ICD-10-CM

## 2020-09-18 HISTORY — DX: COVID-19: U07.1

## 2020-10-03 ENCOUNTER — Encounter: Payer: Self-pay | Admitting: Family Medicine

## 2020-10-03 DIAGNOSIS — G4700 Insomnia, unspecified: Secondary | ICD-10-CM

## 2020-10-04 ENCOUNTER — Other Ambulatory Visit: Payer: Self-pay

## 2020-10-04 DIAGNOSIS — F325 Major depressive disorder, single episode, in full remission: Secondary | ICD-10-CM

## 2020-10-04 MED ORDER — PAROXETINE HCL 20 MG PO TABS: ORAL_TABLET | ORAL | 0 refills | Status: DC

## 2020-10-08 ENCOUNTER — Other Ambulatory Visit: Payer: Self-pay | Admitting: Family Medicine

## 2020-10-08 DIAGNOSIS — F325 Major depressive disorder, single episode, in full remission: Secondary | ICD-10-CM

## 2020-10-16 ENCOUNTER — Other Ambulatory Visit: Payer: Self-pay | Admitting: Family Medicine

## 2020-10-16 DIAGNOSIS — E119 Type 2 diabetes mellitus without complications: Secondary | ICD-10-CM

## 2020-10-22 ENCOUNTER — Other Ambulatory Visit: Payer: Self-pay | Admitting: Family Medicine

## 2020-10-22 DIAGNOSIS — I10 Essential (primary) hypertension: Secondary | ICD-10-CM

## 2020-10-24 ENCOUNTER — Other Ambulatory Visit (HOSPITAL_COMMUNITY): Payer: Self-pay

## 2020-10-24 MED ORDER — ZOLPIDEM TARTRATE ER 12.5 MG PO TBCR: 12.5000 mg | EXTENDED_RELEASE_TABLET | Freq: Every day | ORAL | 0 refills | Status: DC

## 2020-11-08 ENCOUNTER — Telehealth: Payer: Self-pay | Admitting: Family Medicine

## 2020-11-08 NOTE — Telephone Encounter (Signed)
Pt's new insurance, [employer], has denied coverage of Prolia. Sending back denial fax. Included in denial are forms to completed if a appeal is needed.  Please advise.

## 2020-11-09 ENCOUNTER — Telehealth: Payer: Self-pay | Admitting: Family Medicine

## 2020-11-09 NOTE — Telephone Encounter (Signed)
Call [facility] concerning denial of Prolia. Per conversation pt does not meet criteria. Sending back with sections rep mentioned highlighted.

## 2020-11-10 NOTE — Telephone Encounter (Signed)
Sending forms back up in red folder. Looks like the only way to meet "high risk for fracture", according to the sheet we faxed back (see sticky), is if she had osteoporotic fracture, parental hip fracture, Low BMI (nope), RA (nope), or steroids for 3 mos.  I'm not aware of any of these things, confirm with pt.  It is ridiculous to say these are the only things that would make her "high risk for fracture" and qualify for Prolia.  Having OSTEOPOROSIS itself puts her at high risk for fracture.  This is the most ridiculous thing I've seen, if I'm understanding it correctly.  She meets criteria for osteoporosis (T under -2.5) and failed alendronate--took for >1 year (much longer), with progression of osteoporosis despite its use.  If we can't get this covered, she will have to go back to alendronate weekly

## 2020-11-14 NOTE — Telephone Encounter (Signed)
I will discuss with her at her upcoming visit, 7/7

## 2020-11-23 ENCOUNTER — Encounter: Payer: Self-pay | Admitting: Family Medicine

## 2020-11-23 NOTE — Progress Notes (Signed)
Chief Complaint  Patient presents with   OTHER    Med check pain in rt. Hip hard to walk started about 5-6 months, pt. Agreed to get GI blood work done today.    Patient presents for 6 month med check, follow-up on chronic problems. She has been camping in [place] for the last 3 months, and is returning on Tuesday, will be there until October.  She is complaining of pain in R lateral hip x 5-6 months. She had R trochanteric bursitis treated with cortisone injection in 07/2020. She had x-ray in 05/2020, normal. It hurts to put weight on the right leg, had pain climbing out of the bathtub. Denies any back pain. Pain is lateral, and towards buttock, not in the groin. She tried ice, heat, Voltaren gel, but still having pain.  She got COVID while in [place] in May, fully recovered.  She was seen with UTI in 08/2020, treated with Septra, culture showed E.coli. Denies any current urinary complaints.  She has had some mild anemia, with low ferritin.  She underwent EGD and colonoscopy in 07/2020.  She was being treated with iron twice daily per GI, and has orders for repeat labs, due now. She has been compliant with BID iron.  Diabetes.  She is compliant with metformin and Trulicity. She was started on the lower dose, and kept at that dose due to good control of sugars.  She denies any side effects to her medication. Last A1c was up to 7%. She has been camping for the last 3 months, not checking blood sugars. Less carbs and sweets while camping. Denies hypoglycemia, polydipsia (rare, when she thinks she is dehydrated), polyuria.  Checks feet regularly and has no concerns/lesions/pain. Last eye exam was 03/2020, no retinopathy.  Lab Results  Component Value Date   HGBA1C 7.0 (A) 06/09/2020     OSA--doing well on CPAP. Compliant with daily use. Feels refreshed in the mornings, denies daytime somnolence.   Hypertension follow-up: BP's aren't checked routinely, but always within the normal  range. Denies dizziness, headaches, chest pain. Denies side effects of medications, no cough. BP Readings from Last 3 Encounters:  08/31/20 120/70  08/24/20 118/72  08/05/20 105/65   Depression: Paxil dose was lowered from 23m to 262mlast year.  She hasn't noticed any recurrent depression, denies anxiety. Not ready to make changes in dose yet, will consider cutting to 1076mt next visit.   Hyperlipidemia follow-up: Patient is reportedly following a low-fat, low cholesterol diet. Compliant with medications (simvastatin and fish oil) and denies medication side effects. LDL was at goal, but TG elevated on last check. She isn't fasting today. Lab Results  Component Value Date   CHOL 147 07/05/2020   HDL 45 07/05/2020   LDLCALC 69 07/05/2020   TRIG 198 (H) 07/05/2020   CHOLHDL 3.3 07/05/2020    Osteoporosis: She has been treated with Prolia without side effects, switched from fosamax after her 05/2017 DEXA showed T-2.7 at spine. Last DEXA was 10/2019, T-2.0 at spine (improved).  Last injection was in 04/2020. Her husband retired, and her insurance changed to [employer] will no longer cover Prolia.    Insomnia: requires Ambien CR nightly.  Ambien is effective in treating the insomnia--she doesn't sleep at all if she doesn't take it, and restless legs were much worse (last occurred when had issues with mail order supply getting lost/delayed). Denies unusual behavior (previously would ask husband for food, text people).  This was last filled 08/01/20 for 90d  supply per PDMP. RF was sent in June, which she reports was transferred to [facility] and she did fill it there.     PMH, PSH, SH reviewed  Outpatient Encounter Medications as of 11/24/2020  Medication Sig Note   alendronate (FOSAMAX) 70 MG tablet Take 1 tablet (70 mg total) by mouth every 7 (seven) days. Take with a full glass of water on an empty stomach.    Alpha-D-Galactosidase (BEANO PO) Take 1 tablet by mouth daily.    Calcium  Carb-Cholecalciferol (CALCIUM 600 + D PO) Take 1 tablet by mouth 2 (two) times daily.    cetirizine (ZYRTEC) 10 MG tablet Take 10 mg by mouth daily.    Ferrous Sulfate 140 (45 Fe) MG TBCR Take 1 tablet by mouth 2 (two) times daily.    lisinopril (ZESTRIL) 10 MG tablet TAKE 1 TABLET BY MOUTH EVERY DAY    metFORMIN (GLUCOPHAGE-XR) 500 MG 24 hr tablet Take 2 tablets (1,000 mg total) by mouth daily.    Multiple Vitamin (MULTIVITAMIN WITH MINERALS) TABS tablet Take 1 tablet by mouth 2 (two) times daily.    Multiple Vitamins-Minerals (HAIR SKIN AND NAILS FORMULA PO) Take 1 tablet by mouth 2 (two) times daily.    Omega-3 1000 MG CAPS Take 1,000 mg by mouth 2 (two) times daily.    PARoxetine (PAXIL) 20 MG tablet TAKE 1 TABLET BY MOUTH EVERY DAY    simethicone (MYLICON) 062 MG chewable tablet Chew 125 mg by mouth every 6 (six) hours as needed for flatulence.    simvastatin (ZOCOR) 20 MG tablet TAKE 1 TABLET BY MOUTH EVERYDAY AT BEDTIME    TRULICITY 6.94 WN/4.6EV SOPN INJECT 0.75 MG INTO THE SKIN EVERY 7 DAYS.    zolpidem (AMBIEN CR) 12.5 MG CR tablet Take 1 tablet (12.5 mg total) by mouth at bedtime.    acetaminophen (TYLENOL) 500 MG tablet Take 1,500 mg by mouth every 6 (six) hours as needed for moderate pain or headache. (Patient not taking: Reported on 11/24/2020) 11/24/2020: prn   guaiFENesin-dextromethorphan (ROBITUSSIN DM) 100-10 MG/5ML syrup Take 15 mLs by mouth every 4 (four) hours as needed for cough. (Patient not taking: Reported on 11/24/2020) 08/29/2020: Twice daily   LYSINE PO Take 1 tablet by mouth daily as needed (cold sore). (Patient not taking: No sig reported) 11/24/2020: prn   naproxen sodium (ALEVE) 220 MG tablet Take 440 mg by mouth daily as needed. (Patient not taking: No sig reported) 06/09/2020: Takes 2 once daily in the morning due to her recent hip pain   omeprazole (PRILOSEC OTC) 20 MG tablet Take 1 capsule up to twice daily for the next 1-2 weeks (Patient not taking: No sig reported)  06/09/2020: Uses prn, about once a week   Pseudoeph-Doxylamine-DM-APAP (NYQUIL PO) Take 2 capsules by mouth daily. (Patient not taking: No sig reported)    Pseudoephedrine-APAP-DM (DAYQUIL PO) Take by mouth. (Patient not taking: No sig reported)    sulfamethoxazole-trimethoprim (BACTRIM DS) 800-160 MG tablet Take 1 tablet by mouth 2 (two) times daily. (Patient not taking: Reported on 11/24/2020)    [DISCONTINUED] azithromycin (ZITHROMAX) 250 MG tablet Take 2 tablets by mouth on first day, then 1 tablet by mouth on days 2 through 5    [DISCONTINUED] benzonatate (TESSALON) 200 MG capsule Take 1 capsule (200 mg total) by mouth 3 (three) times daily as needed for cough.    Facility-Administered Encounter Medications as of 11/24/2020  Medication   0.9 %  sodium chloride infusion   0.9 %  sodium  chloride infusion   NOT taking alendronate prior to today's visit  Allergies  Allergen Reactions   Heparin Other (See Comments)    Low platelets   Niacin And Related Other (See Comments)    Blotchy/itchy   Tessalon [Benzonatate] Diarrhea   Penicillins Rash and Other (See Comments)    Has patient had a PCN reaction causing immediate rash, facial/tongue/throat swelling, SOB or lightheadedness with hypotension: Unknown Has patient had a PCN reaction causing severe rash involving mucus membranes or skin necrosis: Unknown Has patient had a PCN reaction that required hospitalization: No Has patient had a PCN reaction occurring within the last 10 years: No If all of the above answers are "NO", then may proceed with Cephalosporin use.     ROS:  No fever, chills, URI symptoms, headaches, dizziness, chest pain, shortness of breath.  Denies GI or GU complaints, bleeding, bruising, rashes, breast concerns. Moods are good.  Hip pain per HPI. Joint pain--some knee pain (mild, relieved by Tylenol).  Insomnia is well controlled with Ambien CR      PHYSICAL EXAM:  BP 124/82   Pulse 68   Temp 97.8 F (36.6 C)    Wt 150 lb 12.8 oz (68.4 kg)   BMI 32.63 kg/m   Wt Readings from Last 3 Encounters:  11/24/20 150 lb 12.8 oz (68.4 kg)  08/31/20 148 lb (67.1 kg)  08/29/20 146 lb (66.2 kg)   Well appearing, pleasant, obese female in no distress HEENT: conjunctiva and sclera are clear, EOMI, wearing mask Neck: no lymphadenopathy, thyromegaly, carotid bruit or mass Heart: regular rate and rhythm Lungs: clear bilaterally Back: no spinal or CVA tenderness Abdomen: soft, obese, nontender, no mass Extremities: no edema, 2+ pulses.   Nontender at R trochanteric bursa.  She has some tenderness in deep buttock muscles, and pain with pyriformis stretch on R Psych: normal mood, affect, hygiene and grooming Neuro: alert and oriented, normal gait  Lab Results  Component Value Date   HGBA1C 6.4 (A) 11/24/2020     ASSESSMENT/PLAN:  Type 2 diabetes mellitus with other specified complication, without long-term current use of insulin (HCC) - Plan: HgB A1c, metFORMIN (GLUCOPHAGE-XR) 500 MG 24 hr tablet  Hypertension associated with diabetes ([place]) - cont lisinopril, low Na diet; daily exercise and wt loss rec  Hyperlipidemia associated with type 2 diabetes mellitus (HCC) - cont statin  Iron deficiency anemia, unspecified iron deficiency anemia type - due for f/u--advised to contact GI for lab visit (we can't release their orders, would need to re-enter and forward all results); doesn't need labs for visit  Depression, major, in remission ([place]) - cont 21m of paxil. Consider further taper to 190m- Plan: PARoxetine (PAXIL) 20 MG tablet  Osteoporosis, unspecified osteoporosis type, unspecified pathological fracture presence - insurance no longer paying for Prolia. Go back to fosamax--reviewed proper way to take and discussed Ca, D, weight-bearing exercise - Plan: alendronate (FOSAMAX) 70 MG tablet  OSA on CPAP - compliant, continue. weight loss encouraged  Class 1 obesity due to excess calories with serious  comorbidity and body mass index (BMI) of 32.0 to 32.9 in adult - comorbidities: HTN, HLD, OSA, GERD. Encouraged proper diet, exercise, weight loss  [place], major, in [place]- Discussed tapering Paxil down to 2045mShe is interested in trying this. Resume 38m31m recurrent depressive sx develop - Plan: PARoxetine (PAXIL) 20 MG tablet  Pure hypercholesterolemia - LDL at goal on simvastatin. Last TG elevated, but sugars higher. Cont lowfat diet, current meds.  Recheck at physical - Plan: simvastatin (ZOCOR) 20 MG tablet  COVID booster given today.  F/u 6 months for CPE with fasting labs prior--A1c, c-met lipids, CBC, TSH, urine microalb

## 2020-11-24 ENCOUNTER — Ambulatory Visit (INDEPENDENT_AMBULATORY_CARE_PROVIDER_SITE_OTHER): Payer: 59 | Admitting: Family Medicine

## 2020-11-24 ENCOUNTER — Other Ambulatory Visit: Payer: Self-pay

## 2020-11-24 ENCOUNTER — Encounter: Payer: Self-pay | Admitting: Family Medicine

## 2020-11-24 VITALS — BP 124/82 | HR 68 | Temp 97.8°F | Wt 150.8 lb

## 2020-11-24 DIAGNOSIS — M810 Age-related osteoporosis without current pathological fracture: Secondary | ICD-10-CM

## 2020-11-24 DIAGNOSIS — F325 Major depressive disorder, single episode, in full remission: Secondary | ICD-10-CM

## 2020-11-24 DIAGNOSIS — E1169 Type 2 diabetes mellitus with other specified complication: Secondary | ICD-10-CM

## 2020-11-24 DIAGNOSIS — E6609 Other obesity due to excess calories: Secondary | ICD-10-CM

## 2020-11-24 DIAGNOSIS — E1159 Type 2 diabetes mellitus with other circulatory complications: Secondary | ICD-10-CM

## 2020-11-24 DIAGNOSIS — E7800 Pure hypercholesterolemia, unspecified: Secondary | ICD-10-CM

## 2020-11-24 DIAGNOSIS — E785 Hyperlipidemia, unspecified: Secondary | ICD-10-CM

## 2020-11-24 DIAGNOSIS — D509 Iron deficiency anemia, unspecified: Secondary | ICD-10-CM

## 2020-11-24 DIAGNOSIS — Z23 Encounter for immunization: Secondary | ICD-10-CM

## 2020-11-24 DIAGNOSIS — Z6832 Body mass index (BMI) 32.0-32.9, adult: Secondary | ICD-10-CM

## 2020-11-24 DIAGNOSIS — Z9989 Dependence on other enabling machines and devices: Secondary | ICD-10-CM

## 2020-11-24 DIAGNOSIS — G4733 Obstructive sleep apnea (adult) (pediatric): Secondary | ICD-10-CM

## 2020-11-24 DIAGNOSIS — Z5181 Encounter for therapeutic drug level monitoring: Secondary | ICD-10-CM

## 2020-11-24 DIAGNOSIS — I152 Hypertension secondary to endocrine disorders: Secondary | ICD-10-CM

## 2020-11-24 LAB — POCT GLYCOSYLATED HEMOGLOBIN (HGB A1C): Hemoglobin A1C: 6.4 % — AB (ref 4.0–5.6)

## 2020-11-24 MED ORDER — PAROXETINE HCL 20 MG PO TABS: ORAL_TABLET | ORAL | 1 refills | Status: DC

## 2020-11-24 MED ORDER — ALENDRONATE SODIUM 70 MG PO TABS: 70.0000 mg | ORAL_TABLET | ORAL | 3 refills | Status: DC

## 2020-11-24 MED ORDER — METFORMIN HCL ER 500 MG PO TB24: 1000.0000 mg | ORAL_TABLET | Freq: Every day | ORAL | 1 refills | Status: DC

## 2020-11-24 MED ORDER — SIMVASTATIN 20 MG PO TABS: ORAL_TABLET | ORAL | 1 refills | Status: DC

## 2020-11-24 NOTE — Patient Instructions (Addendum)
Since the cortisone shot didn't help, and x-rays were normal, I feel the next step, given that you're going to be out of state for the next few months, would be a trial of physical therapy while in [place].  Let us know the name/fax of a convenient physical therapist that takes your insurance, and we can send an order so that you can get PT while there. If not improving, we can set you up with an orthopedist or sports med doc in October when you're back.  Do the stretches as shown (pyriformis muscle)--crossing your right ankle over your left knee, and putting gentle pressure on the right knee to stretch the deep buttock muscles.   We are switching you back to weekly alendronate since your insurance will not cover the Prolia injections. Be sure to take this properly--full glass of water, and nothing to eat or drink and no reclining or laying down for at least 30 minutes. Let us know if you can't tolerate this medication and we can adjust. It is very important that you get at least 1200 (up to 1500mg  max) of calcium daily through your diet and any vitamins, in combination. Continue your multivitamin daily (plus there is D in your calcium pills). If you are getting a LOT of calcium in your diet, you may want to decrease the calcium supplement to just once daily (on those days).  Calcium Content in Foods Calcium is the most abundant mineral in the body. Most of the body's calcium supply is stored in bones and teeth. Calcium helps many parts of the body function normally, including: Blood and blood vessels. Nerves. Hormones. Muscles. Bones and teeth. When your calcium stores are low, you may be at risk for low bone mass, bone loss, and broken bones (fractures). When you get enough calcium, it helps to support strong bones and teeththroughout your life. Calcium is especially important for: Children during growth spurts. Girls during adolescence. Women who are pregnant or breastfeeding. Women  after their menstrual cycle stops (postmenopause). Women whose menstrual cycle has stopped due to anorexia nervosa or regular intense exercise. People who cannot eat or digest dairy products. Vegans. Recommended daily amounts of calcium: Women (ages 26 to 47): 1,000 mg per day. Women (ages 3 and older): 1,200 mg per day. Men (ages 39 to 80): 1,000 mg per day. Men (ages 7 and older): 1,200 mg per day. Women (ages 6 to 68): 1,300 mg per day. Men (ages 46 to 52): 1,300 mg per day. General information Eat foods that are high in calcium. Try to get most of your calcium from food. Some people may benefit from taking calcium supplements. Check with your health care provider or diet and nutrition specialist (dietitian) before starting any calcium supplements. Calcium supplements may interact with certain medicines. Too much calcium may cause other health problems, such as constipation and kidney stones. For the body to absorb calcium, it needs vitamin D. Sources of vitamin D include: Skin exposure to direct sunlight. Foods, such as egg yolks, liver, mushrooms, saltwater fish, and fortified milk. Vitamin D supplements. Check with your health care provider or dietitian before starting any vitamin D supplements. What foods are high in calcium?  Foods that are high in calcium contain more than 100 milligrams per serving. Fruits Fortified orange juice or other fruit juice, 300 mg per 8 oz serving. Vegetables Collard greens, 360 mg per 8 oz serving. Kale, 100 mg per 8 oz serving. Bok choy, 160 mg per 8 oz serving. Grains  Fortified ready-to-eat cereals, 100 to 1,000 mg per 8 oz serving. Fortified frozen waffles, 200 mg in 2 waffles. Oatmeal, 140 mg in 1 cup. Meats and other proteins Sardines, canned with bones, 325 mg per 3 oz serving. Salmon, canned with bones, 180 mg per 3 oz serving. Canned shrimp, 125 mg per 3 oz serving. Baked beans, 160 mg per 4 oz serving. Tofu, firm, made with calcium  sulfate, 253 mg per 4 oz serving. Dairy Yogurt, plain, low-fat, 310 mg per 6 oz serving. Nonfat milk, 300 mg per 8 oz serving. American cheese, 195 mg per 1 oz serving. Cheddar cheese, 205 mg per 1 oz serving. Cottage cheese 2%, 105 mg per 4 oz serving. Fortified soy, rice, or almond milk, 300 mg per 8 oz serving. Mozzarella, part skim, 210 mg per 1 oz serving. The items listed above may not be a complete list of foods high in calcium. Actual amounts of calcium may be different depending on processing. Contact a dietitian for more information. What foods are lower in calcium? Foods that are lower in calcium contain 50 mg or less per serving. Fruits Apple, about 6 mg. Banana, about 12 mg. Vegetables Lettuce, 19 mg per 2 oz serving. Tomato, about 11 mg. Grains Rice, 4 mg per 6 oz serving. Boiled potatoes, 14 mg per 8 oz serving. White bread, 6 mg per slice. Meats and other proteins Egg, 27 mg per 2 oz serving. Red meat, 7 mg per 4 oz serving. Chicken, 17 mg per 4 oz serving. Fish, cod, or trout, 20 mg per 4 oz serving. Dairy Cream cheese, regular, 14 mg per 1 Tbsp serving. Brie cheese, 50 mg per 1 oz serving. Parmesan cheese, 70 mg per 1 Tbsp serving. The items listed above may not be a complete list of foods lower in calcium. Actual amounts of calcium may be different depending on processing. Contact a dietitian for more information. Summary Calcium is an important mineral in the body because it affects many functions. Getting enough calcium helps support strong bones and teeth throughout your life. Try to get most of your calcium from food. Calcium supplements may interact with certain medicines. Check with your health care provider or dietitian before starting any calcium supplements. This information is not intended to replace advice given to you by your health care provider. Make sure you discuss any questions you have with your healthcare provider. Document Revised:  09/02/2019 Document Reviewed: 09/02/2019 Elsevier Patient Education  [place].

## 2020-11-25 NOTE — Addendum Note (Signed)
Addended byLinus Salmons, Philopater Mucha D on: 11/25/2020 08:15 AM   Modules accepted: Orders

## 2020-12-01 ENCOUNTER — Encounter: Payer: 59 | Admitting: Family Medicine

## 2021-01-24 ENCOUNTER — Encounter: Payer: Self-pay | Admitting: Family Medicine

## 2021-01-24 DIAGNOSIS — G4700 Insomnia, unspecified: Secondary | ICD-10-CM

## 2021-01-25 ENCOUNTER — Encounter: Payer: Self-pay | Admitting: *Deleted

## 2021-01-25 MED ORDER — ZOLPIDEM TARTRATE ER 12.5 MG PO TBCR: 12.5000 mg | EXTENDED_RELEASE_TABLET | Freq: Every day | ORAL | 0 refills | Status: DC

## 2021-01-26 ENCOUNTER — Other Ambulatory Visit: Payer: Self-pay | Admitting: Family Medicine

## 2021-01-26 DIAGNOSIS — I10 Essential (primary) hypertension: Secondary | ICD-10-CM

## 2021-02-15 ENCOUNTER — Encounter: Payer: Self-pay | Admitting: *Deleted

## 2021-03-13 ENCOUNTER — Other Ambulatory Visit: Payer: Self-pay | Admitting: Family Medicine

## 2021-03-13 DIAGNOSIS — Z1231 Encounter for screening mammogram for malignant neoplasm of breast: Secondary | ICD-10-CM

## 2021-03-28 ENCOUNTER — Ambulatory Visit (INDEPENDENT_AMBULATORY_CARE_PROVIDER_SITE_OTHER): Payer: 59

## 2021-03-28 ENCOUNTER — Other Ambulatory Visit: Payer: Self-pay

## 2021-03-28 DIAGNOSIS — Z23 Encounter for immunization: Secondary | ICD-10-CM

## 2021-04-04 ENCOUNTER — Other Ambulatory Visit: Payer: Self-pay | Admitting: Family Medicine

## 2021-04-04 DIAGNOSIS — E119 Type 2 diabetes mellitus without complications: Secondary | ICD-10-CM

## 2021-04-11 ENCOUNTER — Other Ambulatory Visit: Payer: Self-pay

## 2021-04-11 ENCOUNTER — Ambulatory Visit
Admission: RE | Admit: 2021-04-11 | Discharge: 2021-04-11 | Disposition: A | Discharge status: Home / Self Care | Payer: 59 | Source: Ambulatory Visit | Attending: Family Medicine | Admitting: Family Medicine

## 2021-04-11 ENCOUNTER — Encounter: Payer: Self-pay | Admitting: Family Medicine

## 2021-04-11 DIAGNOSIS — Z1231 Encounter for screening mammogram for malignant neoplasm of breast: Secondary | ICD-10-CM

## 2021-04-12 ENCOUNTER — Other Ambulatory Visit: Payer: Self-pay | Admitting: *Deleted

## 2021-04-12 MED ORDER — GLUCOSE BLOOD VI STRP: 1.0000 | ORAL_STRIP | Freq: Every day | 2 refills | Status: DC

## 2021-04-24 ENCOUNTER — Other Ambulatory Visit: Payer: Self-pay | Admitting: Family Medicine

## 2021-04-24 DIAGNOSIS — G4700 Insomnia, unspecified: Secondary | ICD-10-CM

## 2021-04-24 NOTE — Telephone Encounter (Signed)
Is this okay to refill? 

## 2021-04-25 ENCOUNTER — Other Ambulatory Visit: Payer: Self-pay

## 2021-04-25 DIAGNOSIS — I10 Essential (primary) hypertension: Secondary | ICD-10-CM

## 2021-04-25 MED ORDER — LISINOPRIL 10 MG PO TABS: 10.0000 mg | ORAL_TABLET | Freq: Every day | ORAL | 0 refills | Status: DC

## 2021-06-06 ENCOUNTER — Other Ambulatory Visit: Payer: Self-pay | Admitting: Family Medicine

## 2021-06-06 DIAGNOSIS — F325 Major depressive disorder, single episode, in full remission: Secondary | ICD-10-CM

## 2021-06-06 DIAGNOSIS — E1169 Type 2 diabetes mellitus with other specified complication: Secondary | ICD-10-CM

## 2021-06-06 DIAGNOSIS — E7800 Pure hypercholesterolemia, unspecified: Secondary | ICD-10-CM

## 2021-06-08 ENCOUNTER — Other Ambulatory Visit: Payer: Self-pay

## 2021-06-08 ENCOUNTER — Other Ambulatory Visit (INDEPENDENT_AMBULATORY_CARE_PROVIDER_SITE_OTHER): Payer: 59

## 2021-06-08 DIAGNOSIS — Z5181 Encounter for therapeutic drug level monitoring: Secondary | ICD-10-CM | POA: Diagnosis not present

## 2021-06-08 DIAGNOSIS — E1169 Type 2 diabetes mellitus with other specified complication: Secondary | ICD-10-CM

## 2021-06-08 DIAGNOSIS — D509 Iron deficiency anemia, unspecified: Secondary | ICD-10-CM | POA: Diagnosis not present

## 2021-06-08 DIAGNOSIS — E7800 Pure hypercholesterolemia, unspecified: Secondary | ICD-10-CM

## 2021-06-09 LAB — LIPID PANEL
Chol/HDL Ratio: 2.9 ratio (ref 0.0–4.4)
Cholesterol, Total: 129 mg/dL (ref 100–199)
HDL: 45 mg/dL (ref >39)
LDL Chol Calc (NIH): 54 mg/dL (ref 0–99)
Triglycerides: 179 mg/dL — ABNORMAL HIGH (ref 0–149)
VLDL Cholesterol Cal: 30 mg/dL (ref 5–40)

## 2021-06-09 LAB — CBC WITH DIFFERENTIAL/PLATELET
Basophils Absolute: 0 10*3/uL (ref 0.0–0.2)
Basos: 1 %
EOS (ABSOLUTE): 0.3 10*3/uL (ref 0.0–0.4)
Eos: 4 %
Hematocrit: 37.6 % (ref 34.0–46.6)
Hemoglobin: 12.6 g/dL (ref 11.1–15.9)
Immature Grans (Abs): 0 10*3/uL (ref 0.0–0.1)
Immature Granulocytes: 0 %
Lymphocytes Absolute: 3 10*3/uL (ref 0.7–3.1)
Lymphs: 40 %
MCH: 30.7 pg (ref 26.6–33.0)
MCHC: 33.5 g/dL (ref 31.5–35.7)
MCV: 92 fL (ref 79–97)
Monocytes Absolute: 0.7 10*3/uL (ref 0.1–0.9)
Monocytes: 9 %
Neutrophils Absolute: 3.6 10*3/uL (ref 1.4–7.0)
Neutrophils: 46 %
Platelets: 198 10*3/uL (ref 150–450)
RBC: 4.1 x10E6/uL (ref 3.77–5.28)
RDW: 13.1 % (ref 11.7–15.4)
WBC: 7.7 10*3/uL (ref 3.4–10.8)

## 2021-06-09 LAB — COMPREHENSIVE METABOLIC PANEL
ALT: 42 IU/L — ABNORMAL HIGH (ref 0–32)
AST: 30 IU/L (ref 0–40)
Albumin/Globulin Ratio: 2.1 (ref 1.2–2.2)
Albumin: 4.5 g/dL (ref 3.8–4.8)
Alkaline Phosphatase: 60 IU/L (ref 44–121)
BUN/Creatinine Ratio: 23 (ref 12–28)
BUN: 15 mg/dL (ref 8–27)
Bilirubin Total: 0.2 mg/dL (ref 0.0–1.2)
CO2: 22 mmol/L (ref 20–29)
Calcium: 9.4 mg/dL (ref 8.7–10.3)
Chloride: 103 mmol/L (ref 96–106)
Creatinine, Ser: 0.64 mg/dL (ref 0.57–1.00)
Globulin, Total: 2.1 g/dL (ref 1.5–4.5)
Glucose: 131 mg/dL — ABNORMAL HIGH (ref 70–99)
Potassium: 4.6 mmol/L (ref 3.5–5.2)
Sodium: 139 mmol/L (ref 134–144)
Total Protein: 6.6 g/dL (ref 6.0–8.5)
eGFR: 100 mL/min/{1.73_m2} (ref >59)

## 2021-06-09 LAB — HEMOGLOBIN A1C
Est. average glucose Bld gHb Est-mCnc: 140 mg/dL
Hgb A1c MFr Bld: 6.5 % — ABNORMAL HIGH (ref 4.8–5.6)

## 2021-06-09 LAB — MICROALBUMIN / CREATININE URINE RATIO
Creatinine, Urine: 111.6 mg/dL
Microalb/Creat Ratio: 18 mg/g creat (ref 0–29)
Microalbumin, Urine: 19.7 ug/mL

## 2021-06-09 LAB — TSH: TSH: 1.34 u[IU]/mL (ref 0.450–4.500)

## 2021-06-11 NOTE — Progress Notes (Signed)
Chief Complaint  Patient presents with   Annual Exam    Nonfasting annual exam with pelvic. Mentions that summer she had some issues with loose stools, all good now but is wondering why this happened? No other concerns.     Julie Fischer is a 61 y.o. female who presents for a complete physical and follow-up on chronic problems.  See below for labs done prior to visit.   She dropped a kayak on her L great toe when in [place] in May.  X-ray was negative. She still has some discomfort at the base of the toenail. Overall it is much better.  Diabetes.  She is compliant with metformin and Trulicity. She was started on the lower dose, and kept at that dose due to good control of sugars.  She denies any side effects to her medication.  She hasn't been checking her blood sugars, due to having issues with her equipment. Denies hypoglycemia, polydipsia, polyuria.  Checks feet regularly and has no concerns/lesions/pain. Last eye exam was 03/2020, no retinopathy.    OSA--doing well on CPAP. Compliant with daily use. Feels refreshed in the mornings, denies daytime somnolence.   Hypertension follow-up: BP's aren't checked routinely, but always within the normal range. Denies dizziness, headaches, chest pain. Denies side effects of medications, no cough (rare) BP Readings from Last 3 Encounters:  06/12/21 130/70  11/24/20 124/82  08/31/20 120/70     Depression: Paxil dose was lowered from 40mg  to 20mg  last year.  She hasn't noticed any recurrent depression, denies anxiety. She and her husband are both retired, and love camping in [place].  We had discussed further tapering to 10mg  at her visit in July, but she was not ready to try that yet. She is willing to try this today.   Hyperlipidemia follow-up: Patient is reportedly following a low-fat, low cholesterol diet. Compliant with medications (simvastatin and fish oil) and denies medication side effects.   Osteoporosis: She has been treated with  Prolia without side effects, switched from fosamax after her 05/2017 DEXA showed T-2.7 at spine. Last DEXA was 10/2019, T-2.0 at spine (improved).  Last injection was in 04/2020. Her husband retired, and her insurance changed to [employer], which didn't cover Prolia.  She has been back on alendronate, and is tolerating this without side effects. She has been very good about remembering to take it every Friday.  Insomnia: requires Ambien CR nightly.  Ambien is effective in treating the insomnia--she doesn't sleep at all if she doesn't take it, and restless legs were much worse (last occurred when had issues with mail order supply getting lost/delayed). Denies unusual behavior (previously would ask husband for food, text people).  This was last filled 04/25/21 for #90.  She has had some mild anemia, with low ferritin.  She underwent EGD and colonoscopy in 07/2020 with Dr. Bryan Lemma.  She was being treated with iron twice daily per GI.  She was last seen in April, and was to have a 3 month follow-up labs for GI. She did not have this done. She is still taking iron, just once daily. She denies any melena or hematochezia, or other bleeding.  Per his notes, the plan was: Repeat CBC and iron panel in 3 months -If repeat panel demonstrates normalization, plan to stop oral iron and repeat again 3 months later.  If still normal, annual surveillance would be appropriate -If repeat panel still demonstrates low ferritin, or if subsequent testing at 51-month point (off iron at that point) again  shows return of IDA, low threshold for VCE for further small bowel interrogation  H/o breast cancer:  She is no longer under the care of oncologist (previously saw Dr. Truddie Coco.)  Last mammogram was 03/2021  She checks her breasts regularly.  She has some intermittent tenderness around her scar (chronic, unchanged). She denies any changes or breast concerns.   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 She has had some  more flares last year (used to just get once a year).  She has been taking Lysine prn, which also helps. She had one outbreak around Christmas, but overall hasn't been getting often, 1-2x/year.   Immunization History  Administered Date(s) Administered   Influenza Split 02/07/2009, 04/10/2010, 04/19/2011, 01/28/2012   Influenza,inj,Quad PF,6+ Mos 01/28/2013, 01/28/2014, 02/03/2015, 02/23/2016, 03/13/2017, 02/10/2018, 03/09/2019, 03/29/2020, 03/28/2021   Influenza,inj,quad, With Preservative 02/18/2017   PFIZER(Purple Top)SARS-COV-2 Vaccination 08/13/2019, 09/07/2019, 04/13/2020, 11/24/2020   Pfizer Covid-19 Vaccine Bivalent Booster 58yrs & up 03/28/2021   Pneumococcal Polysaccharide-23 06/04/2001, 11/15/2010, 03/15/2016   Td 06/04/2001   Tdap 11/15/2010, 06/09/2020   Zoster Recombinat (Shingrix) 04/01/2018, 06/02/2018   Last Pap smear: s/p hysterectomy   Last mammogram:03/2021 Last colonoscopy: 07/2020, Dr. Bryan Lemma: 2 mm rectal hyperplastic polyp, small ascending colon lipoma, grade 2 hemorrhoids. Repeat in 5 years due to family history Last DEXA: 10/2019 T-2.0 at spine Ophtho: yearly, 03/2020 Dentist: twice yearly   Exercise: once or twice a week, walking 20-25 minutes. Walks more frequently in the summer.   PMH, PSH, SH and FH reviewed and updated  Outpatient Encounter Medications as of 06/12/2021  Medication Sig Note   acetaminophen (TYLENOL) 500 MG tablet Take 1,000 mg by mouth every 6 (six) hours as needed. 06/12/2021: Took last night   alendronate (FOSAMAX) 70 MG tablet Take 1 tablet (70 mg total) by mouth every 7 (seven) days. Take with a full glass of water on an empty stomach. 06/12/2021: Every Friday   Alpha-D-Galactosidase (BEANO PO) Take 1 tablet by mouth daily.    Calcium Carb-Cholecalciferol (CALCIUM 600 + D PO) Take 1 tablet by mouth 2 (two) times daily.    cetirizine (ZYRTEC) 10 MG tablet Take 10 mg by mouth daily.    Ferrous Sulfate 140 (45 Fe) MG TBCR Take 1 tablet by  mouth 2 (two) times daily. 06/12/2021: Taking once daily   lisinopril (ZESTRIL) 10 MG tablet Take 1 tablet (10 mg total) by mouth daily.    metFORMIN (GLUCOPHAGE-XR) 500 MG 24 hr tablet Take 2 tablets (1,000 mg total) by mouth daily.    Multiple Vitamin (MULTIVITAMIN WITH MINERALS) TABS tablet Take 1 tablet by mouth 2 (two) times daily.    Multiple Vitamins-Minerals (HAIR SKIN AND NAILS FORMULA PO) Take 1 tablet by mouth 2 (two) times daily.    Omega-3 1000 MG CAPS Take 1,000 mg by mouth 2 (two) times daily.    omeprazole (PRILOSEC OTC) 20 MG tablet Take 1 capsule up to twice daily for the next 1-2 weeks 06/09/2020: Uses prn, about once a week   PARoxetine (PAXIL) 20 MG tablet TAKE 1 TABLET BY MOUTH EVERY DAY    simethicone (MYLICON) 322 MG chewable tablet Chew 125 mg by mouth every 6 (six) hours as needed for flatulence.    simvastatin (ZOCOR) 20 MG tablet TAKE 1 TABLET BY MOUTH EVERYDAY AT BEDTIME    TRULICITY 0.25 KY/7.0WC SOPN INJECT 0.75 MG INTO THE SKIN EVERY 7 DAYS.    zolpidem (AMBIEN CR) 12.5 MG CR tablet TAKE 1 TABLET (12.5 MG TOTAL)  BY MOUTH AT BEDTIME.    glucose blood test strip 1 each by Other route daily. Use as instructed (Patient not taking: Reported on 06/12/2021)    LYSINE PO Take 1 tablet by mouth daily as needed (cold sore). (Patient not taking: Reported on 08/29/2020) 11/24/2020: prn   naproxen sodium (ALEVE) 220 MG tablet Take 440 mg by mouth daily as needed. (Patient not taking: Reported on 08/29/2020) 06/12/2021: As needed   [DISCONTINUED] acetaminophen (TYLENOL) 500 MG tablet Take 1,500 mg by mouth every 6 (six) hours as needed for moderate pain or headache. (Patient not taking: Reported on 11/24/2020)    [DISCONTINUED] guaiFENesin-dextromethorphan (ROBITUSSIN DM) 100-10 MG/5ML syrup Take 15 mLs by mouth every 4 (four) hours as needed for cough. (Patient not taking: Reported on 11/24/2020) 08/29/2020: Twice daily   [DISCONTINUED] Pseudoeph-Doxylamine-DM-APAP (NYQUIL PO) Take 2 capsules  by mouth daily. (Patient not taking: No sig reported)    [DISCONTINUED] Pseudoephedrine-APAP-DM (DAYQUIL PO) Take by mouth. (Patient not taking: No sig reported)    [DISCONTINUED] sulfamethoxazole-trimethoprim (BACTRIM DS) 800-160 MG tablet Take 1 tablet by mouth 2 (two) times daily. (Patient not taking: Reported on 11/24/2020)    Facility-Administered Encounter Medications as of 06/12/2021  Medication   0.9 %  sodium chloride infusion   0.9 %  sodium chloride infusion   Allergies  Allergen Reactions   Heparin Other (See Comments)    Low platelets   Niacin And Related Other (See Comments)    Blotchy/itchy   Tessalon [Benzonatate] Diarrhea   Penicillins Rash and Other (See Comments)    Has patient had a PCN reaction causing immediate rash, facial/tongue/throat swelling, SOB or lightheadedness with hypotension: Unknown Has patient had a PCN reaction causing severe rash involving mucus membranes or skin necrosis: Unknown Has patient had a PCN reaction that required hospitalization: No Has patient had a PCN reaction occurring within the last 10 years: No If all of the above answers are "NO", then may proceed with Cephalosporin use.     ROS: The patient denies anorexia, fever, headaches, vision changes, ear pain, sore throat, chest pain, palpitations, dizziness, syncope, dyspnea on exertion, cough, swelling, nausea, vomiting, constipation, abdominal pain, melena, hematochezia, hematuria, incontinence, dysuria, vaginal bleeding, discharge, odor or itch, genital lesions, numbness, tingling, weakness, tremor, suspicious skin lesions, abnormal bleeding/bruising, or enlarged lymph nodes.   Chronic insomnia, controlled with nightly ambien.  Moods well controlled as per HPI. Bilateral knee pain (stairs, hills, walks too much)   Rare heartburn. 3 episodes of diarrhea over the summer (over 3 month time frame, once she recalls was related to fried foods).   PHYSICAL EXAM:  BP 130/70    Pulse 80    Ht  4' 8.5" (1.435 m)    Wt 152 lb (68.9 kg)    BMI 33.48 kg/m   Wt Readings from Last 3 Encounters:  06/12/21 152 lb (68.9 kg)  11/24/20 150 lb 12.8 oz (68.4 kg)  08/31/20 148 lb (67.1 kg)    General Appearance:   Alert, cooperative, no distress, appears stated age    Head:   Normocephalic, without obvious abnormality, atraumatic    Eyes:   PERRL, conjunctiva/corneas clear, EOM's intact, fundi benign    Ears:   Normal TM's and external ear canals. Extruded blue PE tube noted in the right canal, unchanged, surrounded by cerumen, partially occluding. L TM and EAC is normal.  Nose:   normal  Throat:   Normal mucosa, no lesions, moist mucus membranes  Neck:   Supple, no lymphadenopathy;  thyroid: no enlargement/ tenderness/nodules; no carotid bruit or JVD    Back:   Spine nontender, no curvature, ROM normal. 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 No focal mass. No nipple discharge or inversion. L breast exam is entirely normal. No axillary lymphadenopathy.  Abdomen:   Soft, nondistended, normoactive bowel sounds, no masses, no hepatosplenomegaly    Genitalia:   Normal external genitalia without lesions. Mild atrophic changes are noted, no erythema. BUS and vagina normal; Bimanual exam revealed surgically absent uterus. No adnexal masses. No mass, rebound tenderness or guarding    Rectal:   Normal tone, no masses or tenderness; guaiac negative stool   Extremities:   No clubbing, cyanosis or edema. No lesions. Normal diabetic foot exam. WHSS B knees.   Pulses:   2+ and symmetric all extremities    Skin:   Skin color, texture, turgor normal, no rashes or lesions     Lymph nodes:   Cervical, supraclavicular, inguinal and axillary nodes normal    Neurologic:   CNII-XII intact, normal strength, sensation and gait; reflexes 2+ and symmetric throughout                             Psych:  Normal mood, affect, hygiene and grooming  Normal diabetic foot exam  Lab Results  Component Value Date   HGBA1C 6.5 (H) 06/08/2021   Fasting glucose 131    Chemistry      Component Value Date/Time   NA 139 06/08/2021 0850   K 4.6 06/08/2021 0850   CL 103 06/08/2021 0850   CO2 22 06/08/2021 0850   BUN 15 06/08/2021 0850   CREATININE 0.64 06/08/2021 0850   CREATININE 0.55 03/18/2017 1006      Component Value Date/Time   CALCIUM 9.4 06/08/2021 0850   ALKPHOS 60 06/08/2021 0850   AST 30 06/08/2021 0850   ALT 42 (H) 06/08/2021 0850   BILITOT 0.2 06/08/2021 0850     Lab Results  Component Value Date   CHOL 129 06/08/2021   HDL 45 06/08/2021   LDLCALC 54 06/08/2021   TRIG 179 (H) 06/08/2021   CHOLHDL 2.9 06/08/2021   Lab Results  Component Value Date   WBC 7.7 06/08/2021   HGB 12.6 06/08/2021   HCT 37.6 06/08/2021   MCV 92 06/08/2021   PLT 198 06/08/2021   Lab Results  Component Value Date   TSH 1.340 06/08/2021   Urine microalb/Cr ratio 18    ASSESSMENT/PLAN:  Annual physical exam  Essential hypertension, benign - well controlled  Type 2 diabetes mellitus with other specified complication, without long-term current use of insulin (HCC) - Overall is controlled.  Titrate up Trulicity, perhaps will help with weight - Plan: metFORMIN (GLUCOPHAGE-XR) 500 MG 24 hr tablet, Dulaglutide (TRULICITY) 1.5 NL/8.9QJ SOPN  Hypertension associated with diabetes ([place])  Hyperlipidemia associated with type 2 diabetes mellitus (HCC) - cont statin, lowfat, low cholesterol diet  Iron deficiency anemia, unspecified iron deficiency anemia type - anemia resolved, currently on iron.  Add iron studies, and then trial OFF iron and recheck in 7mos.  To f/u with GI  if ongoing IDA  Osteoporosis, unspecified osteoporosis type, unspecified pathological fracture presence - cont alendronate. Due for recheck DEXA. Discussed Ca, D, weight-bearing exercise - Plan: DG Bone Density  OSA on CPAP - cont CPAP. Wt loss encouraged  History of bariatric surgery - unfortunately, has regained weight.   Pure hypercholesterolemia - LDL at goal on simvastatin. TG slightly elevated. Reviewed lowfat diet. - Plan: simvastatin (ZOCOR) 20 MG tablet  Depression, major, in remission ([place]) - further taper paxil to 10mg . To increase back to 20mg  if any recurrent symptoms. - Plan: PARoxetine (PAXIL) 10 MG tablet  Adding iron studies to labs done last week. If ferritin is normal, then trial stopping iron x 3 months, recheck CBC and iron studies in 3 months. May need to f/u with GI if persistently abnl.  Reminded to schedule diabetic eye exam.  Discussed monthly self breast exams and yearly mammograms; at least 30 minutes of aerobic activity at least 5 days/week, weight-bearing exercise at least 2x/wk; proper sunscreen use reviewed; healthy diet, including goals of calcium and vitamin D intake and alcohol recommendations (less than or equal to 1 drink/day) reviewed; regular seatbelt use; changing batteries in smoke detectors. Immunization recommendations discussed--UTD; continue yearly flu shots.  Colonoscopy recommendations reviewed, UTD.  Due again 07/2025. DEXA due  10/2021, ordered, and reminded pt to call and schedule.  3 month med check, prior to going to [place] in April. She will be due for recheck of CBC and iron studies at that time (and likely A1c, if going to be in [place] and unable to return for 6 month med check).

## 2021-06-11 NOTE — Patient Instructions (Addendum)
°  HEALTH MAINTENANCE RECOMMENDATIONS:  It is recommended that you get at least 30 minutes of aerobic exercise at least 5 days/week (for weight loss, you may need as much as 60-90 minutes). This can be any activity that gets your heart rate up. This can be divided in 10-15 minute intervals if needed, but try and build up your endurance at least once a week.  Weight bearing exercise is also recommended twice weekly.  Eat a healthy diet with lots of vegetables, fruits and fiber.  "Colorful" foods have a lot of vitamins (ie green vegetables, tomatoes, red peppers, etc).  Limit sweet tea, regular sodas and alcoholic beverages, all of which has a lot of calories and sugar.  Up to 1 alcoholic drink daily may be beneficial for women (unless trying to lose weight, watch sugars).  Drink a lot of water.  Calcium recommendations are 1200-1500 mg daily (1500 mg for postmenopausal women or women without ovaries), and vitamin D 1000 IU daily.  This should be obtained from diet and/or supplements (vitamins), and calcium should not be taken all at once, but in divided doses.  Monthly self breast exams and yearly mammograms for women over the age of 17 is recommended.  Sunscreen of at least SPF 30 should be used on all sun-exposed parts of the skin when outside between the hours of 10 am and 4 pm (not just when at beach or pool, but even with exercise, golf, tennis, and yard work!)  Use a sunscreen that says "broad spectrum" so it covers both UVA and UVB rays, and make sure to reapply every 1-2 hours.  Remember to change the batteries in your smoke detectors when changing your clock times in the spring and fall. Carbon monoxide detectors are recommended for your home.  Use your seat belt every time you are in a car, and please drive safely and not be distracted with cell phones and texting while driving.  Please schedule your routine diabetic eye exam.  Start taking 1/2 tablet of paxil instead of a full pill. Share  with your spouse, and if you have any worsening symptoms (sadness, irritability, anxiety, etc), then go back up to the full tablet.  Stay at the 1/2 tablet for a least 2-3 months.  If doing well, you can further taper off the medication entirely (by taking 1/2 tablet every other day for a week or two, then stopping).  We are going to add on iron stores to your blood from last week.  If these iron stores are normal, then we will have you do a 3 month trial off of the iron supplement.  We will need to recheck your CBC and iron panel in 3 months.  If any worsening, you will need to follow up with Dr. Bryan Lemma.  (You were supposed to have repeat labs with him in the Spring).  Please schedule your bone density test for the end of June or July.  Call the [facility].

## 2021-06-12 ENCOUNTER — Encounter: Payer: Self-pay | Admitting: Family Medicine

## 2021-06-12 ENCOUNTER — Ambulatory Visit (INDEPENDENT_AMBULATORY_CARE_PROVIDER_SITE_OTHER): Payer: 59 | Admitting: Family Medicine

## 2021-06-12 ENCOUNTER — Other Ambulatory Visit: Payer: Self-pay

## 2021-06-12 VITALS — BP 130/70 | HR 80 | Ht <= 58 in | Wt 152.0 lb

## 2021-06-12 DIAGNOSIS — D509 Iron deficiency anemia, unspecified: Secondary | ICD-10-CM

## 2021-06-12 DIAGNOSIS — G4733 Obstructive sleep apnea (adult) (pediatric): Secondary | ICD-10-CM | POA: Diagnosis not present

## 2021-06-12 DIAGNOSIS — E1159 Type 2 diabetes mellitus with other circulatory complications: Secondary | ICD-10-CM

## 2021-06-12 DIAGNOSIS — F325 Major depressive disorder, single episode, in full remission: Secondary | ICD-10-CM

## 2021-06-12 DIAGNOSIS — I10 Essential (primary) hypertension: Secondary | ICD-10-CM

## 2021-06-12 DIAGNOSIS — E7800 Pure hypercholesterolemia, unspecified: Secondary | ICD-10-CM

## 2021-06-12 DIAGNOSIS — E785 Hyperlipidemia, unspecified: Secondary | ICD-10-CM

## 2021-06-12 DIAGNOSIS — E1169 Type 2 diabetes mellitus with other specified complication: Secondary | ICD-10-CM

## 2021-06-12 DIAGNOSIS — I152 Hypertension secondary to endocrine disorders: Secondary | ICD-10-CM

## 2021-06-12 DIAGNOSIS — Z0000 Encounter for general adult medical examination without abnormal findings: Secondary | ICD-10-CM | POA: Diagnosis not present

## 2021-06-12 DIAGNOSIS — M810 Age-related osteoporosis without current pathological fracture: Secondary | ICD-10-CM | POA: Diagnosis not present

## 2021-06-12 DIAGNOSIS — Z9989 Dependence on other enabling machines and devices: Secondary | ICD-10-CM

## 2021-06-12 DIAGNOSIS — Z9884 Bariatric surgery status: Secondary | ICD-10-CM

## 2021-06-12 MED ORDER — BLOOD GLUCOSE MONITOR KIT: PACK | 0 refills | Status: DC

## 2021-06-12 MED ORDER — PAROXETINE HCL 10 MG PO TABS: 10.0000 mg | ORAL_TABLET | Freq: Every day | ORAL | 3 refills | Status: DC

## 2021-06-12 MED ORDER — METFORMIN HCL ER 500 MG PO TB24: 1000.0000 mg | ORAL_TABLET | Freq: Every day | ORAL | 1 refills | Status: DC

## 2021-06-12 MED ORDER — TRULICITY 1.5 MG/0.5ML ~~LOC~~ SOAJ: 1.5000 mg | SUBCUTANEOUS | 5 refills | Status: DC

## 2021-06-12 MED ORDER — SIMVASTATIN 20 MG PO TABS: ORAL_TABLET | ORAL | 3 refills | Status: DC

## 2021-06-20 ENCOUNTER — Other Ambulatory Visit: Payer: Self-pay

## 2021-06-20 DIAGNOSIS — D509 Iron deficiency anemia, unspecified: Secondary | ICD-10-CM

## 2021-06-23 ENCOUNTER — Other Ambulatory Visit: Payer: Self-pay

## 2021-06-23 ENCOUNTER — Other Ambulatory Visit: Payer: 59

## 2021-06-23 DIAGNOSIS — D509 Iron deficiency anemia, unspecified: Secondary | ICD-10-CM | POA: Diagnosis not present

## 2021-06-24 LAB — IRON,TIBC AND FERRITIN PANEL
Ferritin: 202 ng/mL — ABNORMAL HIGH (ref 15–150)
Iron Saturation: 33 % (ref 15–55)
Iron: 106 ug/dL (ref 27–139)
Total Iron Binding Capacity: 323 ug/dL (ref 250–450)
UIBC: 217 ug/dL (ref 118–369)

## 2021-06-27 ENCOUNTER — Encounter: Payer: Self-pay | Admitting: Family Medicine

## 2021-07-26 ENCOUNTER — Other Ambulatory Visit: Payer: Self-pay | Admitting: Family Medicine

## 2021-07-26 DIAGNOSIS — I10 Essential (primary) hypertension: Secondary | ICD-10-CM

## 2021-07-29 ENCOUNTER — Encounter: Payer: Self-pay | Admitting: Family Medicine

## 2021-07-29 DIAGNOSIS — G4700 Insomnia, unspecified: Secondary | ICD-10-CM

## 2021-07-30 MED ORDER — ZOLPIDEM TARTRATE ER 12.5 MG PO TBCR: 12.5000 mg | EXTENDED_RELEASE_TABLET | Freq: Every day | ORAL | 0 refills | Status: DC

## 2021-08-01 DIAGNOSIS — E119 Type 2 diabetes mellitus without complications: Secondary | ICD-10-CM | POA: Diagnosis not present

## 2021-08-01 DIAGNOSIS — H2513 Age-related nuclear cataract, bilateral: Secondary | ICD-10-CM | POA: Diagnosis not present

## 2021-08-01 DIAGNOSIS — H43811 Vitreous degeneration, right eye: Secondary | ICD-10-CM | POA: Diagnosis not present

## 2021-08-01 LAB — HM DIABETES EYE EXAM

## 2021-08-05 ENCOUNTER — Telehealth: Payer: Self-pay

## 2021-08-05 DIAGNOSIS — G4700 Insomnia, unspecified: Secondary | ICD-10-CM

## 2021-08-05 NOTE — Telephone Encounter (Signed)
P.A. ZOLPIDEM CR

## 2021-08-12 NOTE — Telephone Encounter (Signed)
P.A. approved til 08/05/24, sent mychart message

## 2021-08-13 NOTE — Telephone Encounter (Signed)
Ambien approved til 08/05/2024

## 2021-08-17 ENCOUNTER — Encounter: Payer: Self-pay | Admitting: *Deleted

## 2021-08-18 ENCOUNTER — Encounter: Payer: Self-pay | Admitting: Family Medicine

## 2021-08-22 NOTE — Progress Notes (Signed)
Chief Complaint  ?Patient presents with  ? Diabetes  ?  Nonfasting med check, no new concerns.   ? ? ?Patient presents for 3 month f/u on diabetes and iron stores/anemia, prior to going back to [place]. ? ?At her January visit, we titrated up the Trulicity to 6.9SW dose. Her A1c  was good at 6.5%, but was hoping that the higher dose would help with weight, as well as improve sugars.  She continues on Metformin 1085m daily without side effects. ?She reports tolerating the higher dose without side effects. She notices eating a little less, clothes are a little looser. ?She has not been checking blood sugars.  Denies hypoglycemia, polydipsia, polyuria. ?She has admitted to tasting some of the Easter candy as she has been stuffing the eggs for a church egg hung. ? ?Iron deficiency anemia: She underwent EGD and colonoscopy in 07/2020 with Dr. CBryan Lemma  She was being treated with iron twice daily per GI.  She didn't f/u with GI, and was still taking iron once daily at her January visit here. She denies any melena or hematochezia, or other bleeding. ?We repeated her CBC and iron studies, found that her ferritin was high.  She was advised to stop iron supplement, and plan was for recheck today. Unfortunately, she really only stopped taking the iron last week.  She heard me say to stop the iron, but couldn't prove it to her husband, but then finally saw the lab results (on 3/31), showed it to him, and stopped taking it.  So, this will need [place] be [place] ?If she develops any recurrent iron deficiency, she will need to f/u with GI. ? ? ?Lab Results  ?Component Value Date  ? IRON 106 06/23/2021  ? TIBC 323 06/23/2021  ? FERRITIN 202 (H) 06/23/2021  ? ?Lab Results  ?Component Value Date  ? WBC 7.7 06/08/2021  ? HGB 12.6 06/08/2021  ? HCT 37.6 06/08/2021  ? MCV 92 06/08/2021  ? PLT 198 06/08/2021  ? ?  ?PMH, PSH, SH reviewed ? ?Outpatient Encounter Medications as of 08/23/2021  ?Medication Sig  Note  ? acetaminophen (TYLENOL) 500 MG tablet Take 1,000 mg by mouth every 6 (six) hours as needed. 08/23/2021: Took last night  ? alendronate (FOSAMAX) 70 MG tablet Take 1 tablet (70 mg total) by mouth every 7 (seven) days. Take with a full glass of water on an empty stomach. 06/12/2021: Every Friday  ? Alpha-D-Galactosidase (BEANO PO) Take 1 tablet by mouth daily.   ? blood glucose meter kit and supplies KIT Dispense based on patient and insurance preference. Use daily as directed.   ? Calcium Carb-Cholecalciferol (CALCIUM 600 + D PO) Take 1 tablet by mouth 2 (two) times daily.   ? cetirizine (ZYRTEC) 10 MG tablet Take 10 mg by mouth daily.   ? Dulaglutide (TRULICITY) 1.5 MNI/6.2VOSOPN Inject 1.5 mg into the skin once a week.   ? lisinopril (ZESTRIL) 10 MG tablet TAKE 1 TABLET BY MOUTH EVERY DAY   ? metFORMIN (GLUCOPHAGE-XR) 500 MG 24 hr tablet Take 2 tablets (1,000 mg total) by mouth daily.   ? Multiple Vitamin (MULTIVITAMIN WITH MINERALS) TABS tablet Take 1 tablet by mouth 2 (two) times daily.   ? Multiple Vitamins-Minerals (HAIR SKIN AND NAILS FORMULA PO) Take 1 tablet by mouth 2 (two) times daily.   ? Omega-3 1000 MG CAPS Take 1,000 mg by mouth 2 (two) times daily.   ? omeprazole (PRILOSEC OTC) 20 MG  tablet Take 1 capsule up to twice daily for the next 1-2 weeks 06/09/2020: Uses prn, about once a week  ? PARoxetine (PAXIL) 20 MG tablet TAKE 1 TABLET BY MOUTH EVERY DAY   ? simethicone (MYLICON) 401 MG chewable tablet Chew 125 mg by mouth every 6 (six) hours as needed for flatulence.   ? simvastatin (ZOCOR) 20 MG tablet TAKE 1 TABLET BY MOUTH EVERYDAY AT BEDTIME   ? zolpidem (AMBIEN CR) 12.5 MG CR tablet Take 1 tablet (12.5 mg total) by mouth at bedtime.   ? Ferrous Sulfate 140 (45 Fe) MG TBCR Take 1 tablet by mouth 2 (two) times daily. (Patient not taking: Reported on 08/23/2021) 08/23/2021: Stopped about a week ago  ? glucose blood test strip 1 each by Other route daily. Use as instructed (Patient not taking: Reported  on 06/12/2021)   ? LYSINE PO Take 1 tablet by mouth daily as needed (cold sore). (Patient not taking: Reported on 08/29/2020) 11/24/2020: prn  ? naproxen sodium (ALEVE) 220 MG tablet Take 440 mg by mouth daily as needed. (Patient not taking: Reported on 08/29/2020) 06/12/2021: As needed  ? [DISCONTINUED] PARoxetine (PAXIL) 10 MG tablet Take 1 tablet (10 mg total) by mouth daily. TAKE 1 TABLET BY MOUTH EVERY DAY   ? ?Facility-Administered Encounter Medications as of 08/23/2021  ?Medication  ? 0.9 %  sodium chloride infusion  ? 0.9 %  sodium chloride infusion  ? ?Allergies  ?Allergen Reactions  ? Heparin Other (See Comments)  ?  Low platelets  ? Niacin And Related Other (See Comments)  ?  Blotchy/itchy  ? Tessalon [Benzonatate] Diarrhea  ? Penicillins Rash and Other (See Comments)  ?  Has patient had a PCN reaction causing immediate rash, facial/tongue/throat swelling, SOB or lightheadedness with hypotension: Unknown ?Has patient had a PCN reaction causing severe rash involving mucus membranes or skin necrosis: Unknown ?Has patient had a PCN reaction that required hospitalization: No ?Has patient had a PCN reaction occurring within the last 10 years: No ?If all of the above answers are "NO", then may proceed with Cephalosporin use. ?  ? ?ROS:  She feels a little bloated now, no n/v, after eating a large lunch. ?No f/c, URI symptoms. No chest pain, headaches, dizziness, edema. ?She has frequent diarrhea (unchanged/chronic), not daily. ?No BRB, melena, no bleeding anywhere. ? ? ?PHYSICAL EXAM: ? ?BP 118/62   Pulse 88   Ht 4' 8.5" (1.435 m)   Wt 151 lb 3.2 oz (68.6 kg)   BMI 33.30 kg/m?  ? ?Wt Readings from Last 3 Encounters:  ?08/23/21 151 lb 3.2 oz (68.6 kg)  ?06/12/21 152 lb (68.9 kg)  ?11/24/20 150 lb 12.8 oz (68.4 kg)  ? ?Pleasant, well-appearing female in no distress ?HEENT: conjunctiva and sclera are clear, EOMI. ?Neck: no lymphadenopathy or mass ?Heart: regular rate and rhythm ?Lungs: clear bilaterally ?Abdomen:  soft, nontender, no mass ?Extremities: no edema, 2+ pulses ?Neuro: alert and oriented, normal gait, grossly normal cranial nerves ? ?Lab Results  ?Component Value Date  ? HGBA1C 6.7 (A) 08/23/2021  ? ? ?ASSESSMENT/PLAN: ? ?Type 2 diabetes mellitus with other specified complication, without long-term current use of insulin (HCC) - complications include HTN and HLD. A1c slightly higher, not lower, with increase in Trulicity dose. Cont same dose metformin. Monitor sugars for accountability - Plan: HgB A1c ? ?Iron deficiency anemia, unspecified iron deficiency anemia type - iron stores were elevated.  She just recently stopped iron.  To recheck iron panel in 3 months,  after being off Fe - Plan: Iron, TIBC and Ferritin Panel, CBC with Differential/Platelet ? ?Medication monitoring encounter - Plan: Iron, TIBC and Ferritin Panel, CBC with Differential/Platelet ? ?Class 1 obesity due to excess calories with serious comorbidity and body mass index (BMI) of 33.0 to 33.9 in adult - comorbidity--HTN, DM. counseled re: diet, portions, exercise, weight loss. ? ?CBC, iron panel- to be drawn at [facility] in July, since only recently stopped iron ? ?Can release future orders since actually scheduled OV and not lab visit. ?Will need med RF at that visit. ?None due now (Trulicity was written for 5RF) ? ? ? ?

## 2021-08-23 ENCOUNTER — Ambulatory Visit (INDEPENDENT_AMBULATORY_CARE_PROVIDER_SITE_OTHER): Payer: 59 | Admitting: Family Medicine

## 2021-08-23 ENCOUNTER — Encounter: Payer: Self-pay | Admitting: Family Medicine

## 2021-08-23 VITALS — BP 118/62 | HR 88 | Ht <= 58 in | Wt 151.2 lb

## 2021-08-23 DIAGNOSIS — E6609 Other obesity due to excess calories: Secondary | ICD-10-CM | POA: Diagnosis not present

## 2021-08-23 DIAGNOSIS — Z6833 Body mass index (BMI) 33.0-33.9, adult: Secondary | ICD-10-CM | POA: Diagnosis not present

## 2021-08-23 DIAGNOSIS — Z5181 Encounter for therapeutic drug level monitoring: Secondary | ICD-10-CM | POA: Diagnosis not present

## 2021-08-23 DIAGNOSIS — E1169 Type 2 diabetes mellitus with other specified complication: Secondary | ICD-10-CM | POA: Diagnosis not present

## 2021-08-23 DIAGNOSIS — D509 Iron deficiency anemia, unspecified: Secondary | ICD-10-CM

## 2021-08-23 LAB — POCT GLYCOSYLATED HEMOGLOBIN (HGB A1C): Hemoglobin A1C: 6.7 % — AB (ref 4.0–5.6)

## 2021-08-23 NOTE — Patient Instructions (Addendum)
You can consider trying metamucil daily to see if this help bulks up the stool (prevents loose stools).   ? ?Your A1c was 6.7% (up from 6.5% in January). ?Continue your metformin at the same dose (2 pills daily). ?Continue current Trulicity dose. ? ?Please try and check your sugars fairly regularly--this way you can see when you need to be more careful in your diet (ie no Easter candy, etc). ?It helps with accountability both with food choices, portions and exercise. ? ?We aren't rechecking your iron stores today because you only stopped taking the iron last week. ?Continue to remain OFF the iron (unless you are aware of any bleeding restarting--black or bloody stools, or blood in vomit, or coffee-ground looking vomit). ?We will recheck labs in July (nonfasting), when you come for your visit. ?

## 2021-08-24 ENCOUNTER — Other Ambulatory Visit: Payer: Self-pay | Admitting: Family Medicine

## 2021-08-24 MED ORDER — BLOOD GLUCOSE MONITOR KIT: PACK | 0 refills | Status: DC

## 2021-08-31 MED ORDER — ZOLPIDEM TARTRATE ER 12.5 MG PO TBCR: 12.5000 mg | EXTENDED_RELEASE_TABLET | Freq: Every day | ORAL | 0 refills | Status: DC

## 2021-08-31 NOTE — Addendum Note (Signed)
Addended byRita Ohara on: 08/31/2021 09:21 AM ? ? Modules accepted: Orders ? ?

## 2021-09-16 ENCOUNTER — Other Ambulatory Visit: Payer: Self-pay | Admitting: Family Medicine

## 2021-09-25 ENCOUNTER — Other Ambulatory Visit: Payer: Self-pay | Admitting: Family Medicine

## 2021-09-25 DIAGNOSIS — M810 Age-related osteoporosis without current pathological fracture: Secondary | ICD-10-CM

## 2021-09-27 ENCOUNTER — Other Ambulatory Visit: Payer: Self-pay | Admitting: Family Medicine

## 2021-09-27 DIAGNOSIS — I10 Essential (primary) hypertension: Secondary | ICD-10-CM

## 2021-10-16 ENCOUNTER — Other Ambulatory Visit: Payer: Self-pay | Admitting: Family Medicine

## 2021-10-16 DIAGNOSIS — M810 Age-related osteoporosis without current pathological fracture: Secondary | ICD-10-CM

## 2021-11-21 ENCOUNTER — Encounter: Payer: Self-pay | Admitting: Family Medicine

## 2021-11-21 DIAGNOSIS — G4700 Insomnia, unspecified: Secondary | ICD-10-CM

## 2021-11-21 MED ORDER — ZOLPIDEM TARTRATE ER 12.5 MG PO TBCR: 12.5000 mg | EXTENDED_RELEASE_TABLET | Freq: Every day | ORAL | 0 refills | Status: DC

## 2021-12-06 ENCOUNTER — Other Ambulatory Visit: Payer: Self-pay | Admitting: Family Medicine

## 2021-12-06 DIAGNOSIS — E1169 Type 2 diabetes mellitus with other specified complication: Secondary | ICD-10-CM

## 2021-12-19 ENCOUNTER — Ambulatory Visit
Admission: RE | Admit: 2021-12-19 | Discharge: 2021-12-19 | Disposition: A | Discharge status: Home / Self Care | Payer: 59 | Source: Ambulatory Visit | Attending: Family Medicine | Admitting: Family Medicine

## 2021-12-19 DIAGNOSIS — M8589 Other specified disorders of bone density and structure, multiple sites: Secondary | ICD-10-CM | POA: Diagnosis not present

## 2021-12-19 DIAGNOSIS — M810 Age-related osteoporosis without current pathological fracture: Secondary | ICD-10-CM

## 2021-12-19 DIAGNOSIS — Z780 Asymptomatic menopausal state: Secondary | ICD-10-CM | POA: Diagnosis not present

## 2021-12-19 NOTE — Progress Notes (Unsigned)
No chief complaint on file.  Diabetes follow-up:  A1c was 6.5% in 05/2021 on metformin and 0.'75mg'$  dose of Trulicity. We increased the Trulicity dose to 1.'5mg'$  at that time, hoping that the higher dose would help with weight, as well as improve sugars. Her A1c went up to 6.7%, and she hadn't really lost any weight (<1#). She hadn't been checking her sugars at that time. She continues to tolerate these medications, no side effects.    Sugars are running  She had normal diabetic eye exam in March.    Iron deficiency anemia: She underwent EGD and colonoscopy in 07/2020 with Dr. Bryan Lemma.  She was being treated with iron twice daily per GI.  She didn't f/u with GI. She continued to take iron BID through March. She is due for recheck of CBC and iron studies (last ferritin was elevated in 06/2021, when still taking iron).  She denies any melena or hematochezia, or other bleeding. If she develops any recurrent iron deficiency, she will need to f/u with GI.   Hypertension follow-up: BP's aren't checked routinely, but always within the normal range. Denies dizziness, headaches, chest pain. Denies side effects of medications, no cough (rare) BP Readings from Last 3 Encounters:  08/23/21 118/62  06/12/21 130/70  11/24/20 124/82      Depression: Paxil dose was lowered from '40mg'$  to '20mg'$  in 10/2019, and did well at the '20mg'$  dose.  She felt ready to further taper the dose down to '10mg'$  at her physical in 05/2021.  UPDATE--went back to '20mg'$ , when/why?  Was back on 20 by her April follow-up (not mentioned in April note).  She has been camping in [place] and moods are good.    Hyperlipidemia follow-up: Patient is reportedly following a low-fat, low cholesterol diet. Compliant with medications (simvastatin and fish oil) and denies medication side effects. TG were slightly elevated at last visit. She continues to try and follow a lowfat diet. Lab Results  Component Value Date   CHOL 129 06/08/2021   HDL 45  06/08/2021   LDLCALC 54 06/08/2021   TRIG 179 (H) 06/08/2021   CHOLHDL 2.9 06/08/2021    Osteoporosis: She has been treated with Prolia without side effects, switched from fosamax after her 05/2017 DEXA showed T-2.7 at spine. Last DEXA was 10/2019, T-2.0 at spine (improved).  Last injection was in 04/2020. Her husband retired, and her insurance changed to [employer], which didn't cover Prolia.  She has been back on alendronate, and is tolerating this without side effects. She has been very good about remembering to take it every Friday. She now has [employer]. She had a f/u bone density test earlier this week--T -2.2 at spine (down from 2.0 in 10/2019).    Insomnia: requires Ambien CR nightly.  Ambien is effective in treating the insomnia--she doesn't sleep at all if she doesn't take it, and restless legs were much worse (last occurred when had issues with mail order supply getting lost/delayed). Denies unusual behavior (previously would ask husband for food, text people).  This was last filled a month ago for #90.   PMH, PSH, SH reviewed   ROS:  No fever, chills, URI symptoms, headaches, dizziness, chest pain, shortness of breath.  Denies GI or GU complaints, bleeding, bruising, rashes, breast concerns. Moods are good.   Joint pain--some knee pain (mild, relieved by Tylenol).  Insomnia is well controlled with Ambien CR       PHYSICAL EXAM:  There were no vitals taken for this visit.  Wt Readings from Last 3 Encounters:  08/23/21 151 lb 3.2 oz (68.6 kg)  06/12/21 152 lb (68.9 kg)  11/24/20 150 lb 12.8 oz (68.4 kg)   Well appearing, pleasant, obese female in no distress HEENT: conjunctiva and sclera are clear, EOMI.  Neck: no lymphadenopathy, thyromegaly, carotid bruit or mass Heart: regular rate and rhythm Lungs: clear bilaterally Back: no spinal or CVA tenderness Abdomen: soft, obese, nontender, no mass Extremities: no edema, 2+ pulses.   Psych: normal mood, affect,  hygiene and grooming Neuro: alert and oriented, normal gait   ASSESSMENT/PLAN:   A1c Cbc, ferritin  ?re-try Prolia if covered, vs cont with fosamax?  Alendronate RF due, along with Trulicity, lisinopril, metformin and Paxil '20mg'$

## 2021-12-19 NOTE — Patient Instructions (Incomplete)
I encourage you to get the updated bivalent COVID booster when it becomes available in the Fall  (Hopefully will be available at the same time as the flu shot, otherwise separate them by at least 2 weeks).  We elected to continue the alendronate weekly, since it is easier to get (and affordable), and since the drop in bone density was mild, and still not in the osteoporosis range. Be sure to take it as directed (empty stomach, separate from other medications and food).  You should be taking '1500mg'$  of calcium daily (in divided doses) as total daily intake, from all sources.  You should try and get as much as you can from dietary sources, and make up the difference with the pills.  Please pay attention to any chest discomfort.  Is it with activity, after meals, related to timing of when you took your alendronate, etc. Keep track of this, and if having pain with exertion (not related to meals or at rest), then we need to further evaluate this. The alendronate can cause esophageal irritation/inflammation. If your discomfort is following your dose, you can take an antacid, and going forwards either switch to a once monthly medication (actonel or boniva), vs try taking a prilosec the night before your dose.

## 2021-12-20 ENCOUNTER — Other Ambulatory Visit: Payer: Self-pay | Admitting: Family Medicine

## 2021-12-20 ENCOUNTER — Ambulatory Visit (INDEPENDENT_AMBULATORY_CARE_PROVIDER_SITE_OTHER): Payer: 59 | Admitting: Family Medicine

## 2021-12-20 ENCOUNTER — Encounter: Payer: Self-pay | Admitting: Family Medicine

## 2021-12-20 VITALS — BP 120/60 | HR 72 | Ht <= 58 in | Wt 146.6 lb

## 2021-12-20 DIAGNOSIS — I152 Hypertension secondary to endocrine disorders: Secondary | ICD-10-CM

## 2021-12-20 DIAGNOSIS — E1159 Type 2 diabetes mellitus with other circulatory complications: Secondary | ICD-10-CM | POA: Diagnosis not present

## 2021-12-20 DIAGNOSIS — B001 Herpesviral vesicular dermatitis: Secondary | ICD-10-CM | POA: Diagnosis not present

## 2021-12-20 DIAGNOSIS — I10 Essential (primary) hypertension: Secondary | ICD-10-CM | POA: Diagnosis not present

## 2021-12-20 DIAGNOSIS — M810 Age-related osteoporosis without current pathological fracture: Secondary | ICD-10-CM

## 2021-12-20 DIAGNOSIS — D509 Iron deficiency anemia, unspecified: Secondary | ICD-10-CM

## 2021-12-20 DIAGNOSIS — E1169 Type 2 diabetes mellitus with other specified complication: Secondary | ICD-10-CM | POA: Diagnosis not present

## 2021-12-20 DIAGNOSIS — E785 Hyperlipidemia, unspecified: Secondary | ICD-10-CM | POA: Diagnosis not present

## 2021-12-20 DIAGNOSIS — R69 Illness, unspecified: Secondary | ICD-10-CM | POA: Diagnosis not present

## 2021-12-20 DIAGNOSIS — F325 Major depressive disorder, single episode, in full remission: Secondary | ICD-10-CM

## 2021-12-20 LAB — POCT GLYCOSYLATED HEMOGLOBIN (HGB A1C): Hemoglobin A1C: 6 % — AB (ref 4.0–5.6)

## 2021-12-20 MED ORDER — METFORMIN HCL ER 500 MG PO TB24: 1000.0000 mg | ORAL_TABLET | Freq: Every day | ORAL | 1 refills | Status: DC

## 2021-12-20 MED ORDER — LISINOPRIL 10 MG PO TABS: 10.0000 mg | ORAL_TABLET | Freq: Every day | ORAL | 1 refills | Status: DC

## 2021-12-20 MED ORDER — TRULICITY 1.5 MG/0.5ML ~~LOC~~ SOAJ
1.5000 mg | SUBCUTANEOUS | 5 refills | Status: DC
  Filled 2022-06-02: qty 2, 28d supply, fill #0

## 2021-12-20 MED ORDER — VALACYCLOVIR HCL 1 G PO TABS: ORAL_TABLET | ORAL | 0 refills | Status: AC

## 2021-12-20 MED ORDER — ALENDRONATE SODIUM 70 MG PO TABS: ORAL_TABLET | ORAL | 3 refills | Status: DC

## 2021-12-21 ENCOUNTER — Other Ambulatory Visit: Payer: 59

## 2021-12-21 DIAGNOSIS — Z5181 Encounter for therapeutic drug level monitoring: Secondary | ICD-10-CM

## 2021-12-21 DIAGNOSIS — D509 Iron deficiency anemia, unspecified: Secondary | ICD-10-CM

## 2021-12-22 LAB — IRON,TIBC AND FERRITIN PANEL
Ferritin: 144 ng/mL (ref 15–150)
Iron Saturation: 28 % (ref 15–55)
Iron: 88 ug/dL (ref 27–139)
Total Iron Binding Capacity: 320 ug/dL (ref 250–450)
UIBC: 232 ug/dL (ref 118–369)

## 2021-12-22 LAB — CBC WITH DIFFERENTIAL/PLATELET
Basophils Absolute: 0 10*3/uL (ref 0.0–0.2)
Basos: 1 %
EOS (ABSOLUTE): 0.2 10*3/uL (ref 0.0–0.4)
Eos: 3 %
Hematocrit: 37.3 % (ref 34.0–46.6)
Hemoglobin: 12.5 g/dL (ref 11.1–15.9)
Immature Grans (Abs): 0 10*3/uL (ref 0.0–0.1)
Immature Granulocytes: 0 %
Lymphocytes Absolute: 2.6 10*3/uL (ref 0.7–3.1)
Lymphs: 37 %
MCH: 30 pg (ref 26.6–33.0)
MCHC: 33.5 g/dL (ref 31.5–35.7)
MCV: 89 fL (ref 79–97)
Monocytes Absolute: 0.6 10*3/uL (ref 0.1–0.9)
Monocytes: 9 %
Neutrophils Absolute: 3.6 10*3/uL (ref 1.4–7.0)
Neutrophils: 50 %
Platelets: 191 10*3/uL (ref 150–450)
RBC: 4.17 x10E6/uL (ref 3.77–5.28)
RDW: 12.4 % (ref 11.7–15.4)
WBC: 7.1 10*3/uL (ref 3.4–10.8)

## 2022-01-24 ENCOUNTER — Encounter: Payer: Self-pay | Admitting: Internal Medicine

## 2022-02-06 ENCOUNTER — Other Ambulatory Visit: Payer: Self-pay | Admitting: Family Medicine

## 2022-02-06 NOTE — Telephone Encounter (Signed)
Pt has upcoming appt in January

## 2022-02-21 ENCOUNTER — Encounter: Payer: Self-pay | Admitting: Family Medicine

## 2022-02-21 DIAGNOSIS — G4700 Insomnia, unspecified: Secondary | ICD-10-CM

## 2022-02-21 MED ORDER — ZOLPIDEM TARTRATE ER 12.5 MG PO TBCR: 12.5000 mg | EXTENDED_RELEASE_TABLET | Freq: Every day | ORAL | 0 refills | Status: DC

## 2022-03-13 DIAGNOSIS — H1045 Other chronic allergic conjunctivitis: Secondary | ICD-10-CM | POA: Diagnosis not present

## 2022-03-13 DIAGNOSIS — H02842 Edema of right lower eyelid: Secondary | ICD-10-CM | POA: Diagnosis not present

## 2022-03-13 DIAGNOSIS — H2513 Age-related nuclear cataract, bilateral: Secondary | ICD-10-CM | POA: Diagnosis not present

## 2022-03-20 ENCOUNTER — Other Ambulatory Visit (INDEPENDENT_AMBULATORY_CARE_PROVIDER_SITE_OTHER): Payer: 59

## 2022-03-20 DIAGNOSIS — Z23 Encounter for immunization: Secondary | ICD-10-CM | POA: Diagnosis not present

## 2022-03-23 ENCOUNTER — Encounter: Payer: Self-pay | Admitting: Family Medicine

## 2022-03-23 DIAGNOSIS — G4700 Insomnia, unspecified: Secondary | ICD-10-CM

## 2022-03-26 MED ORDER — ZOLPIDEM TARTRATE ER 12.5 MG PO TBCR: 12.5000 mg | EXTENDED_RELEASE_TABLET | Freq: Every day | ORAL | 0 refills | Status: DC

## 2022-05-20 ENCOUNTER — Other Ambulatory Visit: Payer: Self-pay | Admitting: Family Medicine

## 2022-05-21 NOTE — Telephone Encounter (Signed)
Pt has appt at the end of the month. Not sure why she had gotten #30d supplies vs #90 on last RF.  See what she wants

## 2022-05-22 NOTE — Telephone Encounter (Signed)
Patient does need a refill on this and she asked for only #30 right now.

## 2022-05-30 ENCOUNTER — Encounter: Payer: Self-pay | Admitting: Family Medicine

## 2022-06-02 ENCOUNTER — Other Ambulatory Visit (HOSPITAL_BASED_OUTPATIENT_CLINIC_OR_DEPARTMENT_OTHER): Payer: Self-pay

## 2022-06-14 ENCOUNTER — Telehealth: Payer: Self-pay | Admitting: *Deleted

## 2022-06-14 DIAGNOSIS — I10 Essential (primary) hypertension: Secondary | ICD-10-CM

## 2022-06-14 DIAGNOSIS — E785 Hyperlipidemia, unspecified: Secondary | ICD-10-CM

## 2022-06-14 DIAGNOSIS — Z9884 Bariatric surgery status: Secondary | ICD-10-CM

## 2022-06-14 DIAGNOSIS — E1169 Type 2 diabetes mellitus with other specified complication: Secondary | ICD-10-CM

## 2022-06-14 DIAGNOSIS — Z5181 Encounter for therapeutic drug level monitoring: Secondary | ICD-10-CM

## 2022-06-14 DIAGNOSIS — Z0000 Encounter for general adult medical examination without abnormal findings: Secondary | ICD-10-CM

## 2022-06-14 NOTE — Telephone Encounter (Signed)
ordered

## 2022-06-14 NOTE — Telephone Encounter (Signed)
Patient has CPE at 1:30 on Monday (I did call and ask her to please come no later than 1:15) since it was schedule at 1:30pm. She would like to come tomorrow for fasting labs, need orders please and thanks.

## 2022-06-15 ENCOUNTER — Ambulatory Visit
Admission: RE | Admit: 2022-06-15 | Discharge: 2022-06-15 | Disposition: A | Discharge status: Home / Self Care | Payer: 59 | Source: Ambulatory Visit | Attending: Family Medicine | Admitting: Family Medicine

## 2022-06-15 ENCOUNTER — Other Ambulatory Visit: Payer: 59

## 2022-06-15 ENCOUNTER — Other Ambulatory Visit: Payer: Self-pay | Admitting: Family Medicine

## 2022-06-15 DIAGNOSIS — Z5181 Encounter for therapeutic drug level monitoring: Secondary | ICD-10-CM | POA: Diagnosis not present

## 2022-06-15 DIAGNOSIS — Z1231 Encounter for screening mammogram for malignant neoplasm of breast: Secondary | ICD-10-CM | POA: Diagnosis not present

## 2022-06-15 DIAGNOSIS — Z0000 Encounter for general adult medical examination without abnormal findings: Secondary | ICD-10-CM

## 2022-06-15 DIAGNOSIS — Z9884 Bariatric surgery status: Secondary | ICD-10-CM | POA: Diagnosis not present

## 2022-06-15 DIAGNOSIS — E1169 Type 2 diabetes mellitus with other specified complication: Secondary | ICD-10-CM

## 2022-06-15 DIAGNOSIS — I10 Essential (primary) hypertension: Secondary | ICD-10-CM | POA: Diagnosis not present

## 2022-06-15 DIAGNOSIS — E785 Hyperlipidemia, unspecified: Secondary | ICD-10-CM

## 2022-06-17 NOTE — Patient Instructions (Incomplete)
  HEALTH MAINTENANCE RECOMMENDATIONS:  It is recommended that you get at least 30 minutes of aerobic exercise at least 5 days/week (for weight loss, you may need as much as 60-90 minutes). This can be any activity that gets your heart rate up. This can be divided in 10-15 minute intervals if needed, but try and build up your endurance at least once a week.  Weight bearing exercise is also recommended twice weekly.  Eat a healthy diet with lots of vegetables, fruits and fiber.  "Colorful" foods have a lot of vitamins (ie green vegetables, tomatoes, red peppers, etc).  Limit sweet tea, regular sodas and alcoholic beverages, all of which has a lot of calories and sugar.  Up to 1 alcoholic drink daily may be beneficial for women (unless trying to lose weight, watch sugars).  Drink a lot of water.  Calcium recommendations are 1200-1500 mg daily (1500 mg for postmenopausal women or women without ovaries), and vitamin D 1000 IU daily.  This should be obtained from diet and/or supplements (vitamins), and calcium should not be taken all at once, but in divided doses.  Monthly self breast exams and yearly mammograms for women over the age of 51 is recommended.  Sunscreen of at least SPF 30 should be used on all sun-exposed parts of the skin when outside between the hours of 10 am and 4 pm (not just when at beach or pool, but even with exercise, golf, tennis, and yard work!)  Use a sunscreen that says "broad spectrum" so it covers both UVA and UVB rays, and make sure to reapply every 1-2 hours.  Remember to change the batteries in your smoke detectors when changing your clock times in the spring and fall. Carbon monoxide detectors are recommended for your home.  Use your seat belt every time you are in a car, and please drive safely and not be distracted with cell phones and texting while driving.  Bone density test is due again 12/2023  We are going to increase the Trulicity to '3mg'$  weekly. We also discussed  potentially switching to Ozempic instead (starting at '1mg'$ , and then further increasing, if needed) or switching to [facility].  In addition, you are going to cut back on sweets, try and increase exercise, and check your sugars more regularly (so that you can KNOW when you've  eaten something that raises your sugar, and can then avoid eating that again).  We discussed your insomnia, and concerns regarding your eating and walking around while on the medication. Let's start with simply using the lower dose (which we will have to do at age 62 anyway), and see if this improves things--I will rely on your husband for an update regarding this medication.  If this isn't effective or you still have undesired behaviors, then we discussed possibly trying Belsomnra, or Trazodone for sleep.  Let's plan to follow up on this within 3-4 weeks of the change and see how you're doing.  We discussed RSV vaccine.  You need to get this from the pharmacy, and you can find out the cost/coverage from them.   We discussed giving you prevnar-20 (pneumonia vaccine)--at some point. We can do this at a different visit.  I recommend using Mucinex (plain or DM) for the thick mucus, which contributes to cough.  If the guaifenesin alone doesn't help, then add DM (dextromethorphan, a cough suppressant). Avoid Dayquil--it overlaps ingredients and contains a decongestant that can raise blood pressure.

## 2022-06-17 NOTE — Progress Notes (Unsigned)
No chief complaint on file.   Julie Fischer is a 62 y.o. female who presents for a complete physical and follow-up on chronic problems.  See below for labs done prior to visit.   Diabetes follow-up:  A1c was 6.5% in 05/2021 on metformin and 0.'75mg'$  dose of Trulicity. We increased the Trulicity dose to 1.'5mg'$  at that time, hoping that the higher dose would help with weight, as well as improve sugars. Her A1c went up to 6.7% (08/2021), and she hadn't really lost any weight (<1#). In 12/2021, A1c was better at 6.0%--when very active while in [place] (splitting/bundling/wrapping wood), and she had lost 5#.  She continues to tolerate these medications, no side effects. Had an issue getting her Trulicity this past month, switched to [facility] pharmacy. Sugars have been running   She had normal diabetic eye exam in March 2023.    H/o iron deficiency anemia: She underwent EGD and colonoscopy in 07/2020 with Dr. Bryan Lemma.  She was being treated with iron twice daily per GI.  She didn't f/u with GI, continued taking iron.  Her ferritin was elevated, we had her stop taking iron.  F/u CBC and iron studies in 12/2021 were normal, after being off iron. She denies any melena or hematochezia, or other bleeding.   Hypertension follow-up: BP's aren't checked routinely, but always within the normal range when checked. . Denies dizziness, headaches, chest pain. Denies side effects of medications, no cough. BP Readings from Last 3 Encounters:  12/20/21 120/60  08/23/21 118/62  06/12/21 130/70      Depression: Paxil dose was lowered from '40mg'$  to '20mg'$  in 10/2019, and did well at the '20mg'$  dose.  She felt ready to further taper the dose down to '10mg'$  at her physical in 05/2021. She tapered to 10. They report she was having weird dreams, didn't feel like herself, and so went back up to '20mg'$ . She has been doing well on the '20mg'$  and prefers to continue this dose.   Hyperlipidemia follow-up: Patient is reportedly following a  low-fat, low cholesterol diet. Compliant with medications (simvastatin and fish oil) and denies medication side effects. TG were slightly elevated at last visit. She continues to try and follow a lowfat diet.   Osteoporosis: She had been treated with Prolia without side effects, switched from fosamax after her 05/2017 DEXA showed T-2.7 at spine. DEXA 10/2019-- T-2.0 at spine (improved). Last injection was in 04/2020. Her husband retired, and her insurance changed to [employer], which didn't cover Prolia, so she switched back to alendronate. Follow-up bone density 12/2021--T -2.2 at spine (down from 2.0 in 10/2019).  We discussed the slight drop, and she preferred to stay on alendronate (due to the hassle of Prolia re: insurance/cost).  She understands if bone density continues to drop, may need to switch back to Prolia. Calcium, D,  Weight-bearing exercise?? ***UPDATE  Insomnia: requires Ambien CR nightly.  Ambien is effective in treating the insomnia--she doesn't sleep at all if she doesn't take it, and restless legs were much worse (last occurred when had issues with mail order supply getting lost/delayed).  This was last filled for #30 on 05/22/22. She needs refill.   OSA--doing well on CPAP. Compliant with daily use. Feels refreshed in the mornings, denies daytime somnolence.   Insomnia: requires Ambien CR nightly.  Ambien is effective in treating the insomnia--she doesn't sleep at all if she doesn't take it, and restless legs were much worse (last occurred when had issues with mail order supply getting  lost/delayed). Denies unusual behavior (previously would ask husband for food, text people).  This was last filled 04/25/21 for #90.  H/o breast cancer:  She is no longer under the care of oncologist (previously saw Dr. Truddie Coco.)  Last mammogram was this past Friday.  She checks her breasts regularly.  She has some intermittent tenderness around her scar (chronic, unchanged). She denies any changes or  breast concerns.   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 She has had some more flares last year (used to just get once a year).  She has been taking Lysine prn, which also helps.   Immunization History  Administered Date(s) Administered   COVID-19, mRNA, vaccine(Comirnaty)12 years and older 03/20/2022   Influenza Split 02/07/2009, 04/10/2010, 04/19/2011, 01/28/2012   Influenza,inj,Quad PF,6+ Mos 01/28/2013, 01/28/2014, 02/03/2015, 02/23/2016, 03/13/2017, 02/10/2018, 03/09/2019, 03/29/2020, 03/28/2021, 03/20/2022   Influenza,inj,quad, With Preservative 02/18/2017   PFIZER(Purple Top)SARS-COV-2 Vaccination 08/13/2019, 09/07/2019, 04/13/2020, 11/24/2020   Pfizer Covid-19 Vaccine Bivalent Booster 73yr & up 03/28/2021   Pneumococcal Polysaccharide-23 06/04/2001, 11/15/2010, 03/15/2016   Td 06/04/2001   Tdap 11/15/2010, 06/09/2020   Zoster Recombinat (Shingrix) 04/01/2018, 06/02/2018   Last Pap smear: s/p hysterectomy   Last mammogram:03/2021 Last colonoscopy: 07/2020, Dr. CBryan Lemma 2 mm rectal hyperplastic polyp, small ascending colon lipoma, grade 2 hemorrhoids. Repeat in 5 years due to family history Last DEXA: 10/2019 T-2.0 at spine Ophtho: yearly, 03/2020 Dentist: twice yearly   Exercise: once or twice a week, walking 20-25 minutes. Walks more frequently in the summer.   PMH, PSH, SH and FH reviewed and updated   ROS: The patient denies anorexia, fever, headaches, vision changes, ear pain, sore throat, chest pain, palpitations, dizziness, syncope, dyspnea on exertion, cough, swelling, nausea, vomiting, constipation, abdominal pain, melena, hematochezia, hematuria, incontinence, dysuria, vaginal bleeding, discharge, odor or itch, genital lesions, numbness, tingling, weakness, tremor, suspicious skin lesions, abnormal bleeding/bruising, or enlarged lymph nodes.   Chronic insomnia, controlled with nightly ambien.   Moods well controlled as per HPI. Bilateral knee pain  (stairs, hills, walks too much)   Rare heartburn. weight   PHYSICAL EXAM:  There were no vitals taken for this visit.  Wt Readings from Last 3 Encounters:  12/20/21 146 lb 9.6 oz (66.5 kg)  08/23/21 151 lb 3.2 oz (68.6 kg)  06/12/21 152 lb (68.9 kg)    General Appearance:   Alert, cooperative, no distress, appears stated age    Head:   Normocephalic, without obvious abnormality, atraumatic    Eyes:   PERRL, conjunctiva/corneas clear, EOM's intact, fundi benign    Ears:   Normal TM's and external ear canals. Extruded blue PE tube noted in the right canal, unchanged, surrounded by cerumen, partially occluding. L TM and EAC is normal.  Nose:   No drainage or sinus tenderness  Throat:   Normal mucosa, no lesions, moist mucus membranes  Neck:   Supple, no lymphadenopathy; thyroid: no enlargement/ tenderness/nodules; no carotid bruit or JVD    Back:   Spine nontender, no curvature, ROM normal. 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 No focal mass. No nipple discharge or inversion. L breast exam  is entirely normal. No axillary lymphadenopathy.  Abdomen:   Soft, nondistended, normoactive bowel sounds, no masses, no hepatosplenomegaly    Genitalia:   Normal external genitalia without lesions. Mild atrophic changes are noted, no erythema. BUS and vagina normal; Bimanual exam revealed surgically absent uterus. No adnexal masses.  Rectal:   Normal tone, no masses or tenderness; guaiac negative stool   Extremities:   No clubbing, cyanosis or edema. No lesions. Normal diabetic foot exam. WHSS B knees.   Pulses:   2+ and symmetric all extremities    Skin:    Skin color, texture, turgor normal, no rashes or lesions    Lymph nodes:   Cervical, supraclavicular, inguinal and axillary nodes normal    Neurologic:   CNII-XII intact, normal strength, sensation and gait; reflexes 2+ and symmetric throughout                             Psych:  Normal mood, affect, hygiene and grooming  Normal diabetic foot exam  ***UPDATE EARS--PE tube on R?  Update breast   Lab Results  Component Value Date   HGBA1C 7.2 (H) 06/15/2022   Fasting glucose 132    Chemistry      Component Value Date/Time   NA 139 06/15/2022 0848   K 4.9 06/15/2022 0848   CL 102 06/15/2022 0848   CO2 21 06/15/2022 0848   BUN 18 06/15/2022 0848   CREATININE 0.62 06/15/2022 0848   CREATININE 0.55 03/18/2017 1006      Component Value Date/Time   CALCIUM 9.9 06/15/2022 0848   ALKPHOS 51 06/15/2022 0848   AST 28 06/15/2022 0848   ALT 33 (H) 06/15/2022 0848   BILITOT <0.2 06/15/2022 0848     Lab Results  Component Value Date   CHOL 140 06/15/2022   HDL 40 06/15/2022   LDLCALC 68 06/15/2022   TRIG 191 (H) 06/15/2022   CHOLHDL 3.5 06/15/2022   Lab Results  Component Value Date   WBC 7.8 06/15/2022   HGB 11.7 06/15/2022   HCT 35.8 06/15/2022   MCV 91 06/15/2022   PLT 240 06/15/2022   Lab Results  Component Value Date   TSH 1.190 06/15/2022   Urine microalb/Cr ratio PENDING ***    ASSESSMENT/PLAN:  Did she have cataract surgery in the Fall?  If so, please get dates and enter under surgical history DM eye exam due in March  Consider prevnar-20 (ok <65 bc DM, last pneumovax 2017)  A1c higher--WHY? Diet, missed meds, weight gain?? Adjust meds/try for mounjaro, esp if wt up?  DIABETIC FOOT EXAM  RF ambien w/2RF vs 90? RF lisinopril, metformin, Paxil and simvastatin  Discussed monthly self breast exams and yearly mammograms; at least 30 minutes of aerobic activity at least 5 days/week, weight-bearing exercise at least 2x/wk; proper sunscreen use reviewed;  healthy diet, including goals of calcium and vitamin D intake and alcohol recommendations (less than or equal to 1 drink/day) reviewed; regular seatbelt use; changing batteries in smoke detectors. Immunization recommendations discussed--UTD; continue yearly flu shots.   Prevnar-20 Colonoscopy recommendations reviewed, UTD.  Due again 07/2025. DEXA due again 12/2023

## 2022-06-18 ENCOUNTER — Encounter: Payer: Self-pay | Admitting: Family Medicine

## 2022-06-18 ENCOUNTER — Ambulatory Visit (INDEPENDENT_AMBULATORY_CARE_PROVIDER_SITE_OTHER): Payer: 59 | Admitting: Family Medicine

## 2022-06-18 VITALS — BP 120/70 | HR 76 | Temp 98.8°F | Ht <= 58 in | Wt 148.2 lb

## 2022-06-18 DIAGNOSIS — M810 Age-related osteoporosis without current pathological fracture: Secondary | ICD-10-CM

## 2022-06-18 DIAGNOSIS — G4733 Obstructive sleep apnea (adult) (pediatric): Secondary | ICD-10-CM

## 2022-06-18 DIAGNOSIS — Z0000 Encounter for general adult medical examination without abnormal findings: Secondary | ICD-10-CM

## 2022-06-18 DIAGNOSIS — E1169 Type 2 diabetes mellitus with other specified complication: Secondary | ICD-10-CM

## 2022-06-18 DIAGNOSIS — E1159 Type 2 diabetes mellitus with other circulatory complications: Secondary | ICD-10-CM

## 2022-06-18 DIAGNOSIS — E785 Hyperlipidemia, unspecified: Secondary | ICD-10-CM | POA: Diagnosis not present

## 2022-06-18 DIAGNOSIS — R052 Subacute cough: Secondary | ICD-10-CM

## 2022-06-18 DIAGNOSIS — I10 Essential (primary) hypertension: Secondary | ICD-10-CM

## 2022-06-18 DIAGNOSIS — E7800 Pure hypercholesterolemia, unspecified: Secondary | ICD-10-CM

## 2022-06-18 DIAGNOSIS — B001 Herpesviral vesicular dermatitis: Secondary | ICD-10-CM | POA: Diagnosis not present

## 2022-06-18 DIAGNOSIS — G4700 Insomnia, unspecified: Secondary | ICD-10-CM | POA: Diagnosis not present

## 2022-06-18 DIAGNOSIS — I152 Hypertension secondary to endocrine disorders: Secondary | ICD-10-CM | POA: Diagnosis not present

## 2022-06-18 DIAGNOSIS — F325 Major depressive disorder, single episode, in full remission: Secondary | ICD-10-CM

## 2022-06-18 DIAGNOSIS — R69 Illness, unspecified: Secondary | ICD-10-CM | POA: Diagnosis not present

## 2022-06-18 LAB — COMPREHENSIVE METABOLIC PANEL
ALT: 33 IU/L — ABNORMAL HIGH (ref 0–32)
AST: 28 IU/L (ref 0–40)
Albumin/Globulin Ratio: 2.4 — ABNORMAL HIGH (ref 1.2–2.2)
Albumin: 4.6 g/dL (ref 3.9–4.9)
Alkaline Phosphatase: 51 IU/L (ref 44–121)
BUN/Creatinine Ratio: 29 — ABNORMAL HIGH (ref 12–28)
BUN: 18 mg/dL (ref 8–27)
Bilirubin Total: <0.2 mg/dL (ref 0.0–1.2)
CO2: 21 mmol/L (ref 20–29)
Calcium: 9.9 mg/dL (ref 8.7–10.3)
Chloride: 102 mmol/L (ref 96–106)
Creatinine, Ser: 0.62 mg/dL (ref 0.57–1.00)
Globulin, Total: 1.9 g/dL (ref 1.5–4.5)
Glucose: 132 mg/dL — ABNORMAL HIGH (ref 70–99)
Potassium: 4.9 mmol/L (ref 3.5–5.2)
Sodium: 139 mmol/L (ref 134–144)
Total Protein: 6.5 g/dL (ref 6.0–8.5)
eGFR: 100 mL/min/{1.73_m2} (ref >59)

## 2022-06-18 LAB — LIPID PANEL
Chol/HDL Ratio: 3.5 ratio (ref 0.0–4.4)
Cholesterol, Total: 140 mg/dL (ref 100–199)
HDL: 40 mg/dL (ref >39)
LDL Chol Calc (NIH): 68 mg/dL (ref 0–99)
Triglycerides: 191 mg/dL — ABNORMAL HIGH (ref 0–149)
VLDL Cholesterol Cal: 32 mg/dL (ref 5–40)

## 2022-06-18 LAB — CBC WITH DIFFERENTIAL/PLATELET
Basophils Absolute: 0 10*3/uL (ref 0.0–0.2)
Basos: 1 %
EOS (ABSOLUTE): 0.3 10*3/uL (ref 0.0–0.4)
Eos: 4 %
Hematocrit: 35.8 % (ref 34.0–46.6)
Hemoglobin: 11.7 g/dL (ref 11.1–15.9)
Immature Grans (Abs): 0 10*3/uL (ref 0.0–0.1)
Immature Granulocytes: 0 %
Lymphocytes Absolute: 3 10*3/uL (ref 0.7–3.1)
Lymphs: 39 %
MCH: 29.6 pg (ref 26.6–33.0)
MCHC: 32.7 g/dL (ref 31.5–35.7)
MCV: 91 fL (ref 79–97)
Monocytes Absolute: 0.6 10*3/uL (ref 0.1–0.9)
Monocytes: 7 %
Neutrophils Absolute: 3.8 10*3/uL (ref 1.4–7.0)
Neutrophils: 49 %
Platelets: 240 10*3/uL (ref 150–450)
RBC: 3.95 x10E6/uL (ref 3.77–5.28)
RDW: 12.6 % (ref 11.7–15.4)
WBC: 7.8 10*3/uL (ref 3.4–10.8)

## 2022-06-18 LAB — MICROALBUMIN / CREATININE URINE RATIO
Creatinine, Urine: 107.8 mg/dL
Microalb/Creat Ratio: 28 mg/g creat (ref 0–29)
Microalbumin, Urine: 30.1 ug/mL

## 2022-06-18 LAB — HEMOGLOBIN A1C
Est. average glucose Bld gHb Est-mCnc: 160 mg/dL
Hgb A1c MFr Bld: 7.2 % — ABNORMAL HIGH (ref 4.8–5.6)

## 2022-06-18 LAB — TSH: TSH: 1.19 u[IU]/mL (ref 0.450–4.500)

## 2022-06-18 MED ORDER — SIMVASTATIN 20 MG PO TABS: ORAL_TABLET | ORAL | 3 refills | Status: DC

## 2022-06-18 MED ORDER — PAROXETINE HCL 20 MG PO TABS: 20.0000 mg | ORAL_TABLET | Freq: Every day | ORAL | 3 refills | Status: DC

## 2022-06-18 MED ORDER — LISINOPRIL 10 MG PO TABS: 10.0000 mg | ORAL_TABLET | Freq: Every day | ORAL | 3 refills | Status: DC

## 2022-06-18 MED ORDER — TRULICITY 3 MG/0.5ML ~~LOC~~ SOAJ: 3.0000 mg | SUBCUTANEOUS | 2 refills | Status: DC

## 2022-06-18 MED ORDER — METFORMIN HCL ER 500 MG PO TB24: 1000.0000 mg | ORAL_TABLET | Freq: Every day | ORAL | 1 refills | Status: DC

## 2022-06-18 MED ORDER — ZOLPIDEM TARTRATE ER 6.25 MG PO TBCR: 12.5000 mg | EXTENDED_RELEASE_TABLET | Freq: Every evening | ORAL | 0 refills | Status: DC | PRN

## 2022-06-19 ENCOUNTER — Other Ambulatory Visit: Payer: Self-pay | Admitting: *Deleted

## 2022-06-19 DIAGNOSIS — G4733 Obstructive sleep apnea (adult) (pediatric): Secondary | ICD-10-CM

## 2022-06-20 ENCOUNTER — Other Ambulatory Visit: Payer: Self-pay | Admitting: Family Medicine

## 2022-06-20 DIAGNOSIS — R928 Other abnormal and inconclusive findings on diagnostic imaging of breast: Secondary | ICD-10-CM

## 2022-06-29 ENCOUNTER — Ambulatory Visit: Payer: 59

## 2022-06-29 ENCOUNTER — Ambulatory Visit
Admission: RE | Admit: 2022-06-29 | Discharge: 2022-06-29 | Disposition: A | Discharge status: Home / Self Care | Payer: 59 | Source: Ambulatory Visit | Attending: Family Medicine | Admitting: Family Medicine

## 2022-06-29 DIAGNOSIS — R928 Other abnormal and inconclusive findings on diagnostic imaging of breast: Secondary | ICD-10-CM

## 2022-06-29 DIAGNOSIS — R922 Inconclusive mammogram: Secondary | ICD-10-CM | POA: Diagnosis not present

## 2022-07-02 DIAGNOSIS — M1710 Unilateral primary osteoarthritis, unspecified knee: Secondary | ICD-10-CM | POA: Diagnosis not present

## 2022-07-02 DIAGNOSIS — G4733 Obstructive sleep apnea (adult) (pediatric): Secondary | ICD-10-CM | POA: Diagnosis not present

## 2022-07-08 NOTE — Progress Notes (Deleted)
No chief complaint on file.  Patient presents for follow-up on hip pain and insomnia.  At her recent physical, she complained of bilateral hip pain, R>L, similar to prior bursitis.  She presents today for evaluation (and treatment) of her hip pain.  Insomnia: She had been on Ambien CR nightly 12.63m for quite a while.  At her recent physical, her husband reported she was walking around "clueless", staggers around the house, and he was worried about her falling.  She only asked him once or twice for food, and noted that sometimes she would go down and eat.  She was scared to try something else, states she doesn't sleep at all if she doesn't take the [place] and that her restless legs were much worse.  We decided to try switching to the 6.234mdose.   Today she reports Sleeping well on the lower dose. Denies unusual behavior or restless legs.  Diabetes: her A1c was elevated at recent physical. She hadn't been checking her sugars. She had been eating more sweets (when playing cards), not as careful with her diet. Today she reports her sugars have been running  Lab Results  Component Value Date   HGBA1C 7.2 (H) 06/15/2022     PMH, PSH, SH reviewed   ROS: no fever, chills, URI symptoms, headaches, dizziness, chest pain, shortness of breath. No n/v/d. Insomnia per HPI Hip pain per HPI   PHYSICAL EXAM:  There were no vitals taken for this visit.  Wt Readings from Last 3 Encounters:  06/18/22 148 lb 3.2 oz (67.2 kg)  12/20/21 146 lb 9.6 oz (66.5 kg)  08/23/21 151 lb 3.2 oz (68.6 kg)      ASSESSMENT/PLAN:   Prevnar-20?

## 2022-07-11 ENCOUNTER — Telehealth: Payer: Self-pay | Admitting: *Deleted

## 2022-07-11 ENCOUNTER — Encounter: Payer: Self-pay | Admitting: *Deleted

## 2022-07-11 ENCOUNTER — Encounter: Payer: 59 | Admitting: Family Medicine

## 2022-07-11 DIAGNOSIS — G4700 Insomnia, unspecified: Secondary | ICD-10-CM

## 2022-07-11 NOTE — Telephone Encounter (Signed)
So, the one in March was r/s from this week (she cancelled appt to f/u on her insomnia this week, made the March visit). She scheduled the April med check when leaving from her physical.  I guess just leave the April one so that we can do the CPAP documentation as well--keep the paperwork with the visit so I can make sure to document properly.  If having ongoing sleep issues, she can be seen sooner, but husband reported she seemed to be doing okay on the lower ambien dose when he was recently here.

## 2022-07-11 NOTE — Telephone Encounter (Signed)
I was going to call patient to schedule appt for CPAP compliance sometime between 08/02/22 and 09/30/22. She has two med checks scheduled. One for 3/13 and another for 08/22/22. Does she need both of these? If not, which would you like her to keep and also do you need a separate for the CPAP?

## 2022-07-17 ENCOUNTER — Other Ambulatory Visit: Payer: Self-pay | Admitting: Family Medicine

## 2022-07-17 DIAGNOSIS — G4700 Insomnia, unspecified: Secondary | ICD-10-CM

## 2022-07-18 NOTE — Telephone Encounter (Signed)
Patient is doing well on 6.'25mg'$ . Insurance will only pay for #30 of the 6.'25mg'$ . She is okay to wait until you return for refill.

## 2022-07-31 DIAGNOSIS — M1710 Unilateral primary osteoarthritis, unspecified knee: Secondary | ICD-10-CM | POA: Diagnosis not present

## 2022-07-31 DIAGNOSIS — G4733 Obstructive sleep apnea (adult) (pediatric): Secondary | ICD-10-CM | POA: Diagnosis not present

## 2022-08-01 ENCOUNTER — Encounter: Payer: 59 | Admitting: Family Medicine

## 2022-08-06 ENCOUNTER — Encounter: Payer: Self-pay | Admitting: Family Medicine

## 2022-08-06 DIAGNOSIS — E1169 Type 2 diabetes mellitus with other specified complication: Secondary | ICD-10-CM

## 2022-08-06 DIAGNOSIS — G4700 Insomnia, unspecified: Secondary | ICD-10-CM

## 2022-08-06 NOTE — Telephone Encounter (Signed)
This isn't what I'm seeing on my end from the Rx.  Said take 6.25 nightly, #30 with 2 refills.  I do see on PDMP that they only gave #15, but I'm not sure why, based on the directions.  Please check with pharmacy

## 2022-08-07 DIAGNOSIS — H2513 Age-related nuclear cataract, bilateral: Secondary | ICD-10-CM | POA: Diagnosis not present

## 2022-08-07 DIAGNOSIS — H16223 Keratoconjunctivitis sicca, not specified as Sjogren's, bilateral: Secondary | ICD-10-CM | POA: Diagnosis not present

## 2022-08-07 DIAGNOSIS — H43811 Vitreous degeneration, right eye: Secondary | ICD-10-CM | POA: Diagnosis not present

## 2022-08-07 DIAGNOSIS — E119 Type 2 diabetes mellitus without complications: Secondary | ICD-10-CM | POA: Diagnosis not present

## 2022-08-07 LAB — HM DIABETES EYE EXAM

## 2022-08-07 NOTE — Telephone Encounter (Signed)
Called patient and let her know to call pharmacy and ask them to fill the remaining #15 and she can pay out of pocket and ask for discount card.

## 2022-08-20 ENCOUNTER — Telehealth: Payer: Self-pay | Admitting: *Deleted

## 2022-08-20 NOTE — Telephone Encounter (Signed)
Called patient to let her know PA from 07/20/22 was done today for zolpidem ER 6.25mg  tablets. She can now have #30 per month-approved from today 08/20/22 to 08/20/23.

## 2022-08-21 ENCOUNTER — Telehealth: Payer: Self-pay | Admitting: Family Medicine

## 2022-08-21 NOTE — Telephone Encounter (Signed)
The original prescription written 3/1 had 2 additional refills on it. She should be able to request these refills from her pharmacy.  Please advise.

## 2022-08-21 NOTE — Progress Notes (Unsigned)
No chief complaint on file.  Patient presents for follow-up on chronic problems, as well as CPAP compliance visit.  At her physical in January she reported bilateral hip pain, R>L, similar to prior bursitis. This was not assessed at her visit, was advised to schedule a separate visit (2-3 weeks later), but never did. Today she reports   Diabetes follow-up:  Her A1c was up to 7.2% at her physical in January.  Her A1c had been very good when active in [place]. She admitted to poor diet, eating more sweets, especially when playing cards, and eating larger portions. She wasn't checking sugars regularly at that time. We increased the Trulicity dose to 3mg  weekly. Encouraged smaller portions, limiting sweets, and to monitor her sugars more regularly. We also discussed potentially switching to Ozempic instead (starting at 1mg , and then further increasing, if needed) or switching to [facility].  She made the following dietary changes  Sugars are running  She denies side effects to the medications (trulicity and metformin).   She had normal diabetic eye exam in March 2023.  UPDATE--I think had recently??     Hypertension follow-up: BP's aren't checked routinely, but always within the normal range when checked. She denies dizziness, headaches, chest pain. Denies side effects of medications, no cough. BP Readings from Last 3 Encounters:  06/18/22 120/70  12/20/21 120/60  08/23/21 118/62     Depression: Paxil dose was lowered from 40mg  to 20mg  in 10/2019, and did well at the 20mg  dose.  She tried decreasing further to 10mg  after physical in 05/2021, but she had werd dreams, didn't feel like herself.  She is still doing well on the 20mg  dose, and prefers to continue at this dose.   Hyperlipidemia follow-up: Patient is reportedly following a low-fat, low cholesterol diet. Compliant with medications (simvastatin and fish oil) and denies medication side effects. TG were slightly elevated at last visit  (when sugars/diet wasn't as well controlled).. She continues to try and follow a lowfat diet. Lab Results  Component Value Date   CHOL 140 06/15/2022   HDL 40 06/15/2022   LDLCALC 68 06/15/2022   TRIG 191 (H) 06/15/2022   CHOLHDL 3.5 06/15/2022     Insomnia: requires Ambien CR nightly.  Her dose was decreased to the 6.25mg  dose (from 12.5) at her January visit, based on concerns mentioned by her husband. He had reported staggering around the house, "walking around clueless", was concerned about falls.  They report that she is sleeping well, and is less sedated, doing well on the 6.25mg  dose. (She was scared to try something else).   OSA--doing well on CPAP. Compliant with daily use. Feels refreshed in the mornings, denies daytime somnolence. She had gotten an end of life warning on her machine, so got a new one through [employer]. She denies morning headaches, unrefreshed sleep, daytime somnolence, or any discomfort/problems related to the CPAP machine.     PMH, PSH, SH reviewed   ROS:   PHYSICAL EXAM:  There were no vitals taken for this visit.  Wt Readings from Last 3 Encounters:  06/18/22 148 lb 3.2 oz (67.2 kg)  12/20/21 146 lb 9.6 oz (66.5 kg)  08/23/21 151 lb 3.2 oz (68.6 kg)       ASSESSMENT/PLAN:  I think I might have seen her DM eye exam come across my folders?? Not yet abstracted.  A1c  Prevnar-20  She had mentioned hip pain at her physical, which we said couldn't be addressed, to f/u in 2-3 weeks  just for hip (and f/u on insomnia, since we lowered the dose)--they scheduled the visit much later than a few weeks, and we consolidated them, but also added CPAP compliance to visit. I will NOT be able to address bursitis/hip pain at today's visit. Needs a separate appt (as I previously stated)

## 2022-08-21 NOTE — Telephone Encounter (Signed)
Marthas PA was approved for [employer] for 30 days per month and [id number] [place], [address]. AT [address]

## 2022-08-22 ENCOUNTER — Ambulatory Visit (INDEPENDENT_AMBULATORY_CARE_PROVIDER_SITE_OTHER): Payer: 59 | Admitting: Family Medicine

## 2022-08-22 ENCOUNTER — Encounter: Payer: Self-pay | Admitting: Family Medicine

## 2022-08-22 ENCOUNTER — Encounter: Payer: Self-pay | Admitting: *Deleted

## 2022-08-22 VITALS — BP 118/68 | HR 68 | Ht <= 58 in | Wt 146.0 lb

## 2022-08-22 DIAGNOSIS — Z23 Encounter for immunization: Secondary | ICD-10-CM | POA: Diagnosis not present

## 2022-08-22 DIAGNOSIS — F325 Major depressive disorder, single episode, in full remission: Secondary | ICD-10-CM

## 2022-08-22 DIAGNOSIS — I152 Hypertension secondary to endocrine disorders: Secondary | ICD-10-CM

## 2022-08-22 DIAGNOSIS — G4700 Insomnia, unspecified: Secondary | ICD-10-CM

## 2022-08-22 DIAGNOSIS — M7061 Trochanteric bursitis, right hip: Secondary | ICD-10-CM | POA: Diagnosis not present

## 2022-08-22 DIAGNOSIS — E1159 Type 2 diabetes mellitus with other circulatory complications: Secondary | ICD-10-CM | POA: Diagnosis not present

## 2022-08-22 DIAGNOSIS — E1169 Type 2 diabetes mellitus with other specified complication: Secondary | ICD-10-CM

## 2022-08-22 DIAGNOSIS — G4733 Obstructive sleep apnea (adult) (pediatric): Secondary | ICD-10-CM

## 2022-08-22 DIAGNOSIS — E785 Hyperlipidemia, unspecified: Secondary | ICD-10-CM

## 2022-08-22 LAB — POCT GLYCOSYLATED HEMOGLOBIN (HGB A1C): Hemoglobin A1C: 6.7 % — AB (ref 4.0–5.6)

## 2022-08-22 MED ORDER — TRULICITY 3 MG/0.5ML ~~LOC~~ SOAJ: 3.0000 mg | SUBCUTANEOUS | 5 refills | Status: DC

## 2022-08-22 NOTE — Patient Instructions (Addendum)
Please periodically check your blood sugars. If checking in the morning, sugars below 120 are the goal. I also recommend periodically checking either 2 hours after eating, or at bedtime (this better reflects your food choices).  These values can be up to 160. Please record these on the sheet provided, and bring this sugar log to your visits.  You can also send it to [place] (through [place]) if you have any concerns.  Continue the 3 mg of Trulicity and your metformin.  Work on eating healthier snacks--we discussed snacks high in protein and fiber, lower in sugar and carbs.

## 2022-08-25 NOTE — Progress Notes (Unsigned)
No chief complaint on file.  Patient presents for cortisone injection for treatment of right trochanteric bursitis.  She had been using advil nightly (which is not recommended due to her prior bariatric surgery), as the pain interferes with her sleep. She last had injection for R trochanteric bursitis in 07/2020 She understands the potential risks and alternatives, and wishes to proceed with injection   PMH, PSH, SH reviewed    ROS: no fever, chills, URI symptoms, numbness, tingling, back pain, weakness, bleeding, bruising, rash or other concerns.    PHYSICAL EXAM:  There were no vitals taken for this visit.  Well appearing, pleasant female in no distress. Tender at right trochanteric bursa.  Nontender along IT band, at ASIS or elsewhere in [place].  Verbal consent obtained, understands risks and alternatives, wishes to proceed. R lateral hip was cleansed with alcohol x 3.  Injected 20mg  kenalog, and 1.5 cc each of bupivicaine and 2% lidocaine.  She tolerated the procedure well, without complications.  Nontender on repeat exam after injection  ASSESSMENT/PLAN

## 2022-08-27 ENCOUNTER — Ambulatory Visit (INDEPENDENT_AMBULATORY_CARE_PROVIDER_SITE_OTHER): Payer: 59 | Admitting: Family Medicine

## 2022-08-27 ENCOUNTER — Encounter: Payer: Self-pay | Admitting: Family Medicine

## 2022-08-27 VITALS — BP 120/70 | HR 80 | Ht <= 58 in | Wt 146.0 lb

## 2022-08-27 DIAGNOSIS — M7061 Trochanteric bursitis, right hip: Secondary | ICD-10-CM | POA: Diagnosis not present

## 2022-08-27 MED ORDER — TRIAMCINOLONE ACETONIDE 40 MG/ML IJ SUSP
20.0000 mg | Freq: Once | INTRAMUSCULAR | Status: AC
  Administered 2022-08-27: 20 mg via INTRAMUSCULAR

## 2022-08-27 MED ORDER — LIDOCAINE HCL 2 % IJ SOLN
1.5000 mL | Freq: Once | INTRAMUSCULAR | Status: AC
  Administered 2022-08-27: 30 mg

## 2022-08-31 DIAGNOSIS — M1710 Unilateral primary osteoarthritis, unspecified knee: Secondary | ICD-10-CM | POA: Diagnosis not present

## 2022-08-31 DIAGNOSIS — G4733 Obstructive sleep apnea (adult) (pediatric): Secondary | ICD-10-CM | POA: Diagnosis not present

## 2022-09-06 ENCOUNTER — Telehealth: Payer: Self-pay | Admitting: Family Medicine

## 2022-09-06 NOTE — Telephone Encounter (Signed)
PA sent thru cover my meds for Zolpidem await response

## 2022-09-30 DIAGNOSIS — G4733 Obstructive sleep apnea (adult) (pediatric): Secondary | ICD-10-CM | POA: Diagnosis not present

## 2022-09-30 DIAGNOSIS — M1710 Unilateral primary osteoarthritis, unspecified knee: Secondary | ICD-10-CM | POA: Diagnosis not present

## 2022-10-11 DIAGNOSIS — G4733 Obstructive sleep apnea (adult) (pediatric): Secondary | ICD-10-CM | POA: Diagnosis not present

## 2022-10-16 ENCOUNTER — Telehealth: Payer: Self-pay | Admitting: Family Medicine

## 2022-10-16 NOTE — Telephone Encounter (Signed)
Called [facility] & Rx went thru for $25 & also checked on Zolpidem & it went thru in May.

## 2022-10-16 NOTE — Telephone Encounter (Signed)
[facility] #: [id number] Your PA has been resolved, no additional PA is required. For further inquiries please contact the number on the back of the member prescription card. (Message [phone]) Drug Trulicity 3MG /0.5ML pen-injectors ePA cloud Optician, dispensing PA [facility] [place] [facility] to [facility].

## 2022-10-18 MED ORDER — TRULICITY 1.5 MG/0.5ML ~~LOC~~ SOAJ: 1.5000 mg | SUBCUTANEOUS | 0 refills | Status: DC

## 2022-10-18 MED ORDER — ZOLPIDEM TARTRATE ER 6.25 MG PO TBCR
6.2500 mg | EXTENDED_RELEASE_TABLET | Freq: Every day | ORAL | 2 refills | Status: DC
Start: 2022-10-18 — End: 2023-01-14

## 2022-10-31 DIAGNOSIS — G4733 Obstructive sleep apnea (adult) (pediatric): Secondary | ICD-10-CM | POA: Diagnosis not present

## 2022-10-31 DIAGNOSIS — M1710 Unilateral primary osteoarthritis, unspecified knee: Secondary | ICD-10-CM | POA: Diagnosis not present

## 2022-11-09 ENCOUNTER — Other Ambulatory Visit: Payer: Self-pay | Admitting: Family Medicine

## 2022-11-09 DIAGNOSIS — E1169 Type 2 diabetes mellitus with other specified complication: Secondary | ICD-10-CM

## 2022-11-11 DIAGNOSIS — G4733 Obstructive sleep apnea (adult) (pediatric): Secondary | ICD-10-CM | POA: Diagnosis not present

## 2022-11-12 ENCOUNTER — Telehealth: Payer: Self-pay | Admitting: *Deleted

## 2022-11-12 ENCOUNTER — Other Ambulatory Visit: Payer: Self-pay | Admitting: *Deleted

## 2022-11-12 DIAGNOSIS — E1169 Type 2 diabetes mellitus with other specified complication: Secondary | ICD-10-CM

## 2022-11-12 MED ORDER — TRULICITY 1.5 MG/0.5ML ~~LOC~~ SOAJ
1.5000 mg | SUBCUTANEOUS | 2 refills | Status: DC
Start: 2022-11-12 — End: 2023-03-18

## 2022-11-12 NOTE — Telephone Encounter (Signed)
Lab Results  Component Value Date   HGBA1C 6.7 (A) 08/22/2022   Yes--A1c was okay.  Can continue on the 1.5 mg for now. The higher dose was to get better control and see if it helped more with weight loss also.

## 2022-11-12 NOTE — Telephone Encounter (Signed)
Ok, will send

## 2022-11-12 NOTE — Telephone Encounter (Signed)
I spoke with the pharmacist and they are not able to get the 3mg  at the moment and have not been for a while. He does have the rx from 5/28 that was transferred from [facility]. He can get the 1.5mg . I asked her to call around to other pharmacies to check on the 3mg  and she said that last time her preference was to wait and continue on the 1.5mg . Are you ok if I send this again so she can wait on the 3mg ?

## 2022-11-12 NOTE — Telephone Encounter (Signed)
Looks like PA on 3mg  of Trulicity was approved. I can send in for her. Should I send 30 days or 90?

## 2022-11-12 NOTE — Telephone Encounter (Signed)
90d.  Spoke to [facility] verify with pharmacy, apparently it was transferred, per 5/28 PA message. Pt will check with pharmacy and advise pt  FYI to [name]  This was prescribed at her 4/3 visit.  Curious as to why it took until 5/28 for approval, when rx'd 4/3. Apparently the rx was transferred to [place].  Nobody ever contacted the patient to let her know it was approved (she called today asking about for the 3mg , hadn't started it yet).

## 2022-11-30 DIAGNOSIS — M1710 Unilateral primary osteoarthritis, unspecified knee: Secondary | ICD-10-CM | POA: Diagnosis not present

## 2022-11-30 DIAGNOSIS — G4733 Obstructive sleep apnea (adult) (pediatric): Secondary | ICD-10-CM | POA: Diagnosis not present

## 2022-12-11 DIAGNOSIS — G4733 Obstructive sleep apnea (adult) (pediatric): Secondary | ICD-10-CM | POA: Diagnosis not present

## 2022-12-13 ENCOUNTER — Other Ambulatory Visit: Payer: Self-pay | Admitting: Family Medicine

## 2022-12-13 DIAGNOSIS — E1169 Type 2 diabetes mellitus with other specified complication: Secondary | ICD-10-CM

## 2022-12-18 ENCOUNTER — Other Ambulatory Visit (HOSPITAL_COMMUNITY): Payer: Self-pay

## 2022-12-31 DIAGNOSIS — G4733 Obstructive sleep apnea (adult) (pediatric): Secondary | ICD-10-CM | POA: Diagnosis not present

## 2022-12-31 DIAGNOSIS — M1710 Unilateral primary osteoarthritis, unspecified knee: Secondary | ICD-10-CM | POA: Diagnosis not present

## 2023-01-10 ENCOUNTER — Other Ambulatory Visit: Payer: Self-pay | Admitting: Family Medicine

## 2023-01-10 DIAGNOSIS — E1169 Type 2 diabetes mellitus with other specified complication: Secondary | ICD-10-CM

## 2023-01-11 NOTE — Telephone Encounter (Signed)
Has an appt in October

## 2023-01-14 ENCOUNTER — Encounter: Payer: Self-pay | Admitting: Family Medicine

## 2023-01-14 DIAGNOSIS — G4700 Insomnia, unspecified: Secondary | ICD-10-CM

## 2023-01-14 MED ORDER — ZOLPIDEM TARTRATE ER 6.25 MG PO TBCR: 6.2500 mg | EXTENDED_RELEASE_TABLET | Freq: Every day | ORAL | 0 refills | Status: DC

## 2023-01-31 DIAGNOSIS — G4733 Obstructive sleep apnea (adult) (pediatric): Secondary | ICD-10-CM | POA: Diagnosis not present

## 2023-01-31 DIAGNOSIS — M1710 Unilateral primary osteoarthritis, unspecified knee: Secondary | ICD-10-CM | POA: Diagnosis not present

## 2023-02-14 ENCOUNTER — Encounter: Payer: Self-pay | Admitting: Family Medicine

## 2023-02-14 DIAGNOSIS — G4700 Insomnia, unspecified: Secondary | ICD-10-CM

## 2023-02-14 MED ORDER — ZOLPIDEM TARTRATE ER 6.25 MG PO TBCR
6.2500 mg | EXTENDED_RELEASE_TABLET | Freq: Every day | ORAL | 0 refills | Status: DC
Start: 2023-02-14 — End: 2023-03-14

## 2023-03-02 DIAGNOSIS — G4733 Obstructive sleep apnea (adult) (pediatric): Secondary | ICD-10-CM | POA: Diagnosis not present

## 2023-03-02 DIAGNOSIS — M1710 Unilateral primary osteoarthritis, unspecified knee: Secondary | ICD-10-CM | POA: Diagnosis not present

## 2023-03-14 ENCOUNTER — Encounter: Payer: Self-pay | Admitting: Family Medicine

## 2023-03-15 ENCOUNTER — Other Ambulatory Visit: Payer: Self-pay

## 2023-03-15 DIAGNOSIS — E1169 Type 2 diabetes mellitus with other specified complication: Secondary | ICD-10-CM

## 2023-03-15 MED ORDER — ALENDRONATE SODIUM 70 MG PO TABS: ORAL_TABLET | ORAL | 0 refills | Status: DC

## 2023-03-17 NOTE — Progress Notes (Unsigned)
No chief complaint on file.  Patient presents for 6 month follow-up on chronic problems. She was last seen for injection of right trochanteric bursitis in April.  Hip pain??   Diabetes follow-up:  Last A1c was 6.7% in April (down from 7.2% in 05/2022, after increasing Trulicity dose to 3mg ). She reports compliance with Trulicity 3 mg weekly and Metformin ER 1000 mg daily, and denies side effects.  She limits sweets, has smaller portions (since has decreased appetite with Trulicity).  Candy? Sweets? Soda/juice/tea?   She denies hypoglycemia, polydipsia, polyuria.  Last diabetic eye exam was 07/2022. She checks her feet regularly and denies concerns.    Hypertension follow-up: BP's aren't checked routinely elsewhere.  She denies dizziness, headaches, chest pain. Denies side effects of medications, no cough.  BP Readings from Last 3 Encounters:  08/27/22 120/70  08/22/22 118/68  06/18/22 120/70     Depression: Paxil dose was lowered from 40mg  to 20mg  in 10/2019, and did well at the 20mg  dose. She tried decreasing further to 10mg  after physical in 05/2021, but she had werd dreams, didn't feel like herself.   She continues to do well on the 20mg  dose, and prefers to continue at this dose.   Hyperlipidemia follow-up: Patient is reportedly following a low-fat, low cholesterol diet. Compliant with medications (simvastatin and fish oil) and denies medication side effects. TG were slightly elevated at last visit (when sugars/diet wasn't as well controlled).. She continues to try and follow a lowfat diet.  Lab Results  Component Value Date   CHOL 140 06/15/2022   HDL 40 06/15/2022   LDLCALC 68 06/15/2022   TRIG 191 (H) 06/15/2022   CHOLHDL 3.5 06/15/2022     Insomnia: requires Ambien CR nightly.  Her dose was decreased to the 6.25mg  dose (from 12.5) at her January 2024 visit, based on concerns mentioned by her husband (staggering around the house, "walking around clueless", was concerned  about falls).  She is sleeping well on the lower dose, and is less sedated.   OSA--doing well on CPAP. Compliant with daily use. She denies morning headaches, unrefreshed sleep, daytime somnolence, or any discomfort/problems related to the CPAP machine.     PMH, PSH, SH reviewed   ROS: no f/c, no URI symptoms,  no cough, shortness of breath, wheezing, no chest pain, palpitations, edema, headaches or dizziness. Mild allergies ?? Moods are good. Refreshed with sleep. R hip pain??    PHYSICAL EXAM:  There were no vitals taken for this visit.  Wt Readings from Last 3 Encounters:  08/27/22 146 lb (66.2 kg)  08/22/22 146 lb (66.2 kg)  06/18/22 148 lb 3.2 oz (67.2 kg)    Pleasant, well-appearing female, in good spirits HEENT: conjunctiva and sclera are clear, EOMI Neck: no lymphadenopathy, thyromegaly or mass Heart: regular rate and rhythm Lungs: clear bilaterally Back: no spinal or CVA tenderness Abdomen: soft, nontender Extremities:  no edema.Tender over R trochanteric bursa Psych: normal mood, affect, hygiene and grooming Neuro: alert and oriented, cranial nerves grossly intact. Normal gait   ***UPDATE_-tender at hip bursa??  ASSESSMENT/PLAN:   A1c Flu, covid  CPE is due 05/2023, not scheduled. Needs to be scheduled

## 2023-03-18 ENCOUNTER — Encounter: Payer: Self-pay | Admitting: Family Medicine

## 2023-03-18 ENCOUNTER — Ambulatory Visit (INDEPENDENT_AMBULATORY_CARE_PROVIDER_SITE_OTHER): Payer: 59 | Admitting: Family Medicine

## 2023-03-18 VITALS — BP 118/72 | HR 76 | Ht <= 58 in | Wt 143.6 lb

## 2023-03-18 DIAGNOSIS — E785 Hyperlipidemia, unspecified: Secondary | ICD-10-CM | POA: Diagnosis not present

## 2023-03-18 DIAGNOSIS — E1169 Type 2 diabetes mellitus with other specified complication: Secondary | ICD-10-CM

## 2023-03-18 DIAGNOSIS — G4700 Insomnia, unspecified: Secondary | ICD-10-CM | POA: Diagnosis not present

## 2023-03-18 DIAGNOSIS — Z23 Encounter for immunization: Secondary | ICD-10-CM

## 2023-03-18 DIAGNOSIS — I10 Essential (primary) hypertension: Secondary | ICD-10-CM | POA: Diagnosis not present

## 2023-03-18 DIAGNOSIS — M810 Age-related osteoporosis without current pathological fracture: Secondary | ICD-10-CM

## 2023-03-18 DIAGNOSIS — E7800 Pure hypercholesterolemia, unspecified: Secondary | ICD-10-CM

## 2023-03-18 DIAGNOSIS — E1159 Type 2 diabetes mellitus with other circulatory complications: Secondary | ICD-10-CM

## 2023-03-18 DIAGNOSIS — M7061 Trochanteric bursitis, right hip: Secondary | ICD-10-CM | POA: Diagnosis not present

## 2023-03-18 DIAGNOSIS — M25551 Pain in right hip: Secondary | ICD-10-CM

## 2023-03-18 DIAGNOSIS — G4733 Obstructive sleep apnea (adult) (pediatric): Secondary | ICD-10-CM | POA: Diagnosis not present

## 2023-03-18 DIAGNOSIS — F325 Major depressive disorder, single episode, in full remission: Secondary | ICD-10-CM | POA: Diagnosis not present

## 2023-03-18 DIAGNOSIS — I152 Hypertension secondary to endocrine disorders: Secondary | ICD-10-CM

## 2023-03-18 LAB — POCT GLYCOSYLATED HEMOGLOBIN (HGB A1C): Hemoglobin A1C: 7.1 % — AB (ref 4.0–5.6)

## 2023-03-18 MED ORDER — METFORMIN HCL ER 500 MG PO TB24: 1000.0000 mg | ORAL_TABLET | Freq: Every day | ORAL | 1 refills | Status: DC

## 2023-03-18 MED ORDER — ZOLPIDEM TARTRATE ER 6.25 MG PO TBCR: 6.2500 mg | EXTENDED_RELEASE_TABLET | Freq: Every day | ORAL | 2 refills | Status: DC

## 2023-03-18 MED ORDER — LISINOPRIL 10 MG PO TABS: 10.0000 mg | ORAL_TABLET | Freq: Every day | ORAL | 1 refills | Status: DC

## 2023-03-18 MED ORDER — SIMVASTATIN 20 MG PO TABS: ORAL_TABLET | ORAL | 1 refills | Status: DC

## 2023-03-18 MED ORDER — PAROXETINE HCL 20 MG PO TABS: 20.0000 mg | ORAL_TABLET | Freq: Every day | ORAL | 1 refills | Status: DC

## 2023-03-18 NOTE — Patient Instructions (Addendum)
Please try and eat more vegetables. Cut back on the "white" carbs--try and eat more whole grains (these raise your sugar less, and have more fiber). Try and NOT eat bagels daily.  If you have them, have 1/2 bagel (and ideally whole grain). Consider eating more eggs (whites are the best), oatmeal, fiber-rich cereal.  If you A1c remains >7 despite working on cutting back on your carb intake, then we can further increase the Trulicity. We are sending you back to the diabetes educator for additional nutrition assistance.  Please be sure to exercise 30 minutes/day (even if in 10-15 minutes intervals, inside/outside).  Consider getting the RSV vaccine from the pharmacy in 2 weeks (waiting due to the vaccines given today).  If it isn't covered by your insurance, we can do it next year.  Stop taking Aleve--not good for  your kidneys or stomach. If you have to take something at night to prevent pain, try Tylenol (Tylenol Arthritis lasts longer than plain tylenol).

## 2023-03-25 NOTE — Progress Notes (Unsigned)
   Rubin Payor, PhD, LAT, ATC acting as a scribe for Clementeen Graham, MD.  Julie Fischer is a 62 y.o. female who presents to [facility] today for R hip pain x ***. Pt locates pain to ***  Radiates: Aggravates: Treatments tried:  Pertinent review of systems: ***  Relevant historical information: ***   Exam:  There were no vitals taken for this visit. General: Well Developed, well nourished, and in no acute distress.   MSK: ***    Lab and Radiology Results No results found for this or any previous visit (from the past 72 hour(s)). No results found.     Assessment and Plan: 62 y.o. female with ***   PDMP not reviewed this encounter. No orders of the defined types were placed in this encounter.  No orders of the defined types were placed in this encounter.    Discussed warning signs or symptoms. Please see discharge instructions. Patient expresses understanding.   ***

## 2023-03-26 ENCOUNTER — Encounter: Payer: Self-pay | Admitting: Family Medicine

## 2023-03-26 ENCOUNTER — Ambulatory Visit (INDEPENDENT_AMBULATORY_CARE_PROVIDER_SITE_OTHER): Payer: 59

## 2023-03-26 ENCOUNTER — Ambulatory Visit: Payer: 59 | Admitting: Family Medicine

## 2023-03-26 ENCOUNTER — Other Ambulatory Visit: Payer: Self-pay

## 2023-03-26 VITALS — BP 102/76 | HR 74 | Ht <= 58 in | Wt 141.0 lb

## 2023-03-26 DIAGNOSIS — M1611 Unilateral primary osteoarthritis, right hip: Secondary | ICD-10-CM | POA: Diagnosis not present

## 2023-03-26 DIAGNOSIS — M25551 Pain in right hip: Secondary | ICD-10-CM | POA: Diagnosis not present

## 2023-03-26 NOTE — Patient Instructions (Addendum)
Thank you for coming in today.  You received an injection today. Seek immediate medical attention if the joint becomes red, extremely painful, or is oozing fluid.  Please get an Xray today before you leave  

## 2023-04-02 DIAGNOSIS — M1710 Unilateral primary osteoarthritis, unspecified knee: Secondary | ICD-10-CM | POA: Diagnosis not present

## 2023-04-02 DIAGNOSIS — G4733 Obstructive sleep apnea (adult) (pediatric): Secondary | ICD-10-CM | POA: Diagnosis not present

## 2023-04-04 ENCOUNTER — Other Ambulatory Visit: Payer: Self-pay | Admitting: Family Medicine

## 2023-04-04 DIAGNOSIS — E1169 Type 2 diabetes mellitus with other specified complication: Secondary | ICD-10-CM

## 2023-04-05 NOTE — Telephone Encounter (Signed)
Has an appt beginning of next year

## 2023-04-10 ENCOUNTER — Encounter: Payer: Self-pay | Admitting: Physical Therapy

## 2023-04-10 ENCOUNTER — Ambulatory Visit: Payer: 59 | Admitting: Physical Therapy

## 2023-04-10 DIAGNOSIS — M25652 Stiffness of left hip, not elsewhere classified: Secondary | ICD-10-CM | POA: Diagnosis not present

## 2023-04-10 DIAGNOSIS — M25651 Stiffness of right hip, not elsewhere classified: Secondary | ICD-10-CM | POA: Diagnosis not present

## 2023-04-10 DIAGNOSIS — M6281 Muscle weakness (generalized): Secondary | ICD-10-CM

## 2023-04-10 NOTE — Therapy (Signed)
OUTPATIENT PHYSICAL THERAPY LOWER EXTREMITY EVALUATION   Patient Name: Julie Fischer MRN: [id number] DOB:1960/09/22, 62 y.o., female Today's Date: 04/10/2023  END OF SESSION:  PT End of Session - 04/10/23 0936     Visit Number 1    Number of Visits 16    Date for PT Re-Evaluation 06/05/23    Authorization Type [employer]    PT Start Time 0848    PT Stop Time 0928    PT Time Calculation (min) 40 min    Activity Tolerance Patient tolerated treatment well;No increased pain    Behavior During Therapy [facility] for tasks assessed/performed             Past Medical History:  Diagnosis Date   Allergy    fall seasonal   Anxiety    Blood transfusion    Platelet transfusion when on heparin   Breast cancer (HCC) 2001   R breast(lumpectomy,chemo,radiation,tamoxifen)   C. difficile diarrhea 02/2016   treated with Flagyl   Complication of anesthesia    gets Blanca Friend going under she has been told   COVID-19 09/2020   Depression    treated   Diabetes mellitus    type 2   GERD (gastroesophageal reflux disease)    Hyperlipidemia    Hypertension    resolved after weight loss surgery; recurred 2013   Insomnia chronic   Iron deficiency    h/o   Microalbuminuria    h/o   OA (osteoarthritis) of knee    Osteoporosis    Personal history of chemotherapy    Personal history of radiation therapy    Plantar fasciitis (07') DrRegal   s/p surgical release 01/2011   Sleep apnea    resolved after weight loss surgery; recurrent with weight gain; on CPAP   Vitamin D deficiency    Past Surgical History:  Procedure Laterality Date   APPENDECTOMY     BREAST LUMPECTOMY Right 11/1999   COLONOSCOPY  2002, 2012   cuboid stress fracture Right 12/2002   FOOT SURGERY Left 2008   GASTRIC ROUX-EN-Y  (DrNewman) 3/04   left shoulder fracture repair  (DrMortensen) 12/05   MYRINGOTOMY     tubes B/L, multiple sets   plantar fasciitis release Right 01/30/2011   Dr. Charlsie Merles   TONSILLECTOMY AND  ADENOIDECTOMY     TOTAL KNEE ARTHROPLASTY Left 04/08/2017   Procedure: LEFT TOTAL KNEE ARTHROPLASTY;  Surgeon: Ollen Gross, MD;  Location: [facility];  Service: Orthopedics;  Laterality: Left;   TOTAL KNEE ARTHROPLASTY Right 01/06/2018   Procedure: RIGHT TOTAL KNEE ARTHROPLASTY;  Surgeon: Ollen Gross, MD;  Location: [facility];  Service: Orthopedics;  Laterality: Right;   TOTAL VAGINAL HYSTERECTOMY  2000   and RSO(endometriosis)   Patient Active Problem List   Diagnosis Date Noted   Right hip pain 05/25/2020   Fall 05/25/2020   Frequent urination 04/10/2019   Dysuria 04/10/2019   Vaginal dryness 04/10/2019   Vaginitis, atrophic 04/10/2019   Post-menopausal 04/10/2019   S/P hysterectomy 04/10/2019   Stiffness of right knee 01/10/2018   Family hx of colon cancer 06/05/2017   S/P TKR (total knee replacement), left 06/05/2017   Depressive disorder 05/24/2017   OA (osteoarthritis) of knee 04/08/2017   Eustachian tube dysfunction, right 03/19/2017   Mixed conductive and sensorineural hearing loss of right ear with restricted hearing of left ear 03/19/2017   History of bariatric surgery 07/29/2013   Obesity (BMI 30-39.9) 07/29/2013   Osteoporosis 01/28/2013   Allergic rhinitis 01/28/2013   Anemia, iron  deficiency 07/30/2012   Impaired fasting glucose 12/10/2011   Essential hypertension, benign 12/10/2011   Pure hypercholesterolemia 11/15/2010   Insomnia 11/15/2010   Depression, major, in remission (HCC) 11/15/2010   Osteopenia 11/15/2010   History of breast cancer 11/15/2010    PCP: Joselyn Arrow, MD  REFERRING PROVIDER: Rodolph Bong, MD  REFERRING DIAG: Right hip pain  THERAPY DIAG:  Stiffness of right hip, not elsewhere classified  Stiffness of left hip, not elsewhere classified  Muscle weakness (generalized)  Rationale for Evaluation and Treatment: Rehabilitation  ONSET DATE: Two years   SUBJECTIVE:   SUBJECTIVE STATEMENT: Pt states she has had hip pain for 2 years.  Pt states she does her best to not let her pain stop her from activity but it does limit her ability to complete ADLs at her optimal level. Pt is retired and enjoys walking, staying active, and volunteering in her church. Pt has difficulty with walking, navigating stairs, walking her dog, mopping, vacuuming, laying on it, and carrying >1-2#. Pt denies N/T.  PERTINENT HISTORY: OA R hip, osteopenia, osteoporosis, DM, HTN,   PAIN:  Are you having pain? Yes: NPRS scale: 6 constantly /10 Pain location: Bil hips R>L Pain description: stabbing R>L  Aggravating factors: Walking, navigating stairs, walking her dog, mopping, vacuuming, sleeping on her R hip, and carrying > 2# Relieving factors: Heat and ice which help for 30 mins max; Aleve which helps for a few hours  PRECAUTIONS: None  RED FLAGS: None   WEIGHT BEARING RESTRICTIONS: No  FALLS:  Has patient fallen in last 6 months? No  LIVING ENVIRONMENT: Lives with: lives with their family and lives with their spouse Lives in: House/apartment Stairs: Yes: Internal: 1 flight steps;    OCCUPATION: Retired   PLOF: Independent  PATIENT GOALS: Reduce pain with walking, carrying items >2#, navigating stairs, mopping, vacuuming, and walking her dog.  NEXT MD VISIT: January 2025  OBJECTIVE:  Note: Objective measures were completed at Evaluation unless otherwise noted.  DIAGNOSTIC FINDINGS: X-ray showed R hip OA;   PATIENT SURVEYS:  FOTO 52  COGNITION: Overall cognitive status: Within functional limits for tasks assessed       MUSCLE LENGTH: Hamstrings: Right WFL deg; Left WFL deg Piriformis test: Right Min restriction ; Left WFL  Quad: Right Min restriction w/p!; Left min restriction   POSTURE: decreased lumbar lordosis and weight shift left  PALPATION: TTP R iliac crest/glute & R greater troc  LOWER EXTREMITY MMT:  MMT Right eval Left eval  Hip flexion 4+ 4-  Hip extension    Hip abduction 4- 4-  Hip adduction     Hip internal rotation 3+ 3+  Hip external rotation 3+ 3+  Knee flexion 4+ 4+  Knee extension 5 4+  Ankle dorsiflexion    Ankle plantarflexion    Ankle inversion    Ankle eversion     (Blank rows = not tested) LUMBAR ROM:   Active  A/PROM  eval  Flexion WFL  Extension WFL with p! in R hip  Right lateral flexion WFL with p! in R hip  Left lateral flexion WFL  Right rotation WFL  Left rotation WFL   (Blank rows = not tested)   LOWER EXTREMITY SPECIAL TESTS:    FUNCTIONAL TESTS:  5 times sit to stand: Future session 2 minute walk test: future session  GAIT: Distance walked: 50 ft Assistive device utilized: None Level of assistance: Complete Independence Comments: Min increased frontal plane translation to the L, min reduced  trunk rotation   TODAY'S TREATMENT:                                                                                                                              DATE: 04/10/2023   Therapeutic Exercise: Aerobic: Supine: Prone:  Seated: Standing: Stretches: Prone quad stretch R 2x40"; Leg over bil 2x30"; Supine piriformis stretch Bil 2x30" Neuromuscular Re-education: Manual Therapy: Therapeutic Activity: Self Care:     PATIENT EDUCATION:  PATIENT EDUCATION:  Education details: PT POC, Exam findings, HEP Person educated: Patient Education method: Explanation, Demonstration, Tactile cues, Verbal cues, and Handouts Education comprehension: verbalized understanding, returned demonstration, verbal cues required, tactile cues required, and needs further education    HOME EXERCISE PROGRAM: Access Code: [email or web address] URL: [email or web address] Date: 04/10/2023 Prepared by: Nechama Guard  Exercises - Supine Piriformis Stretch with Foot on Ground  - 1-2 x daily - 7 x weekly - 2 reps - 30 secs hold - Leg Overs  - 1-2 x daily - 7 x weekly - 3 reps - 30 secs hold - Prone Quadriceps Stretch with Strap  - 1-2 x daily - 7 x weekly - 2  reps - 30 secs hold  ASSESSMENT:  CLINICAL IMPRESSION: Patient is a 62 y.o.f who was seen today for physical therapy evaluation and treatment for bilateral hip pain (R>L) with signs and symptoms consistent with greater trochanteric pain syndrome. Her primary impairments include hip mobility deficits, hip muscle performance deficits, hip muscle length deficits, and balance deficits. These deficits limit her ability to walk, walk her dog, navigate stairs, carrying items > 2#, mop, and vacuum without pain/limitation. Pt would benefit from skilled PT to address the above impairments.   OBJECTIVE IMPAIRMENTS: Abnormal gait, decreased balance, decreased coordination, decreased endurance, difficulty walking, decreased ROM, decreased strength, hypomobility, impaired flexibility, and pain.   ACTIVITY LIMITATIONS: carrying, lifting, bending, sitting, sleeping, stairs, and locomotion level  PARTICIPATION LIMITATIONS: cleaning, laundry, driving, and community activity  PERSONAL FACTORS: Past/current experiences and 3+ comorbidities: R hip OA, osteopenia, osteoporosis, DM, and HTN,  are also affecting patient's functional outcome.   REHAB POTENTIAL: Good  CLINICAL DECISION MAKING: Stable/uncomplicated  EVALUATION COMPLEXITY: Low   GOALS: Goals reviewed with patient? Yes  SHORT TERM GOALS: Target date: 05/01/2023 Pt will be independent in initial HEP  Goal status: INITIAL  2.  Pt will report 50% improvement in her ability to mop/vacuum. Goal status: INITIAL   LONG TERM GOALS: Target date: 06/05/2023  Pt will be independent in final HEP.   Goal status: INITIAL  2.  Pt will demo ability to carry at least 10# with pain less than 3/10 to promote return to ADLs. Goal status: INITIAL  3.  Pt will report 75% improvement in the ability to walk for at least 20 mins.  Goal status: INITIAL  4.  Pt will demo improved lumbar AROM to [facility] and without pain/limitation to improve ability to complete  ADLs. Goal status: INITIAL  5.  Pt will improve FOTO score by at least 10 points to promote return to ADLs.  Baseline:  Goal status: INITIAL  PLAN:  PT FREQUENCY: 1-2x/week  PT DURATION: 8 weeks  PLANNED INTERVENTIONS: 97110-Therapeutic exercises, 97530- Therapeutic activity, [id number]- Neuromuscular re-education, 97535- Self Care, [id number]- Manual therapy, [phone]- Gait training, [phone]- Aquatic Therapy, Patient/Family education, Balance training, Stair training, Taping, Dry Needling, Joint mobilization, Spinal mobilization, Vestibular training, Cryotherapy, and Moist heat  PLAN FOR NEXT SESSION: Assess response to HEP, STM to hips, continue lumbopelvic mobility interventions.   Sedalia Muta, PT  This entire session was performed under direct supervision and direction of a licensed therapist/therapist assistant . I have personally read, edited and approve of the note as written.   04/10/2023, 1:31 PM

## 2023-04-15 NOTE — Progress Notes (Signed)
Right hip x-ray shows mild to minimal arthritis

## 2023-04-16 ENCOUNTER — Ambulatory Visit: Payer: 59 | Admitting: Physical Therapy

## 2023-04-16 ENCOUNTER — Encounter: Payer: Self-pay | Admitting: Physical Therapy

## 2023-04-16 DIAGNOSIS — M25552 Pain in left hip: Secondary | ICD-10-CM | POA: Diagnosis not present

## 2023-04-16 DIAGNOSIS — M25651 Stiffness of right hip, not elsewhere classified: Secondary | ICD-10-CM | POA: Diagnosis not present

## 2023-04-16 DIAGNOSIS — M25551 Pain in right hip: Secondary | ICD-10-CM | POA: Diagnosis not present

## 2023-04-16 DIAGNOSIS — M25652 Stiffness of left hip, not elsewhere classified: Secondary | ICD-10-CM | POA: Diagnosis not present

## 2023-04-16 DIAGNOSIS — M6281 Muscle weakness (generalized): Secondary | ICD-10-CM | POA: Diagnosis not present

## 2023-04-16 NOTE — Therapy (Signed)
OUTPATIENT PHYSICAL THERAPY LOWER EXTREMITY Treatment   Patient Name: Julie Fischer MRN: [id number] DOB:15-Sep-1960, 62 y.o., female Today's Date: 04/16/2023  END OF SESSION:  PT End of Session - 04/16/23 1008     Visit Number 2    Number of Visits 16    Date for PT Re-Evaluation 06/05/23    Authorization Type Aet[employer]    PT Start Time 1018    PT Stop Time 1100    PT Time Calculation (min) 42 min    Activity Tolerance Patient tolerated treatment well;No increased pain    Behavior During Therapy [facility] for tasks assessed/performed             Past Medical History:  Diagnosis Date   Allergy    fall seasonal   Anxiety    Blood transfusion    Platelet transfusion when on heparin   Breast cancer (HCC) 2001   R breast(lumpectomy,chemo,radiation,tamoxifen)   C. difficile diarrhea 02/2016   treated with Flagyl   Complication of anesthesia    gets Blanca Friend going under she has been told   COVID-19 09/2020   Depression    treated   Diabetes mellitus    type 2   GERD (gastroesophageal reflux disease)    Hyperlipidemia    Hypertension    resolved after weight loss surgery; recurred 2013   Insomnia chronic   Iron deficiency    h/o   Microalbuminuria    h/o   OA (osteoarthritis) of knee    Osteoporosis    Personal history of chemotherapy    Personal history of radiation therapy    Plantar fasciitis (07') DrRegal   s/p surgical release 01/2011   Sleep apnea    resolved after weight loss surgery; recurrent with weight gain; on CPAP   Vitamin D deficiency    Past Surgical History:  Procedure Laterality Date   APPENDECTOMY     BREAST LUMPECTOMY Right 11/1999   COLONOSCOPY  2002, 2012   cuboid stress fracture Right 12/2002   FOOT SURGERY Left 2008   GASTRIC ROUX-EN-Y  (DrNewman) 3/04   left shoulder fracture repair  (DrMortensen) 12/05   MYRINGOTOMY     tubes B/L, multiple sets   plantar fasciitis release Right 01/30/2011   Dr. Charlsie Merles   TONSILLECTOMY AND  ADENOIDECTOMY     TOTAL KNEE ARTHROPLASTY Left 04/08/2017   Procedure: LEFT TOTAL KNEE ARTHROPLASTY;  Surgeon: Ollen Gross, MD;  Location: [facility];  Service: Orthopedics;  Laterality: Left;   TOTAL KNEE ARTHROPLASTY Right 01/06/2018   Procedure: RIGHT TOTAL KNEE ARTHROPLASTY;  Surgeon: Ollen Gross, MD;  Location: [facility];  Service: Orthopedics;  Laterality: Right;   TOTAL VAGINAL HYSTERECTOMY  2000   and RSO(endometriosis)   Patient Active Problem List   Diagnosis Date Noted   Right hip pain 05/25/2020   Fall 05/25/2020   Frequent urination 04/10/2019   Dysuria 04/10/2019   Vaginal dryness 04/10/2019   Vaginitis, atrophic 04/10/2019   Post-menopausal 04/10/2019   S/P hysterectomy 04/10/2019   Stiffness of right knee 01/10/2018   Family hx of colon cancer 06/05/2017   S/P TKR (total knee replacement), left 06/05/2017   Depressive disorder 05/24/2017   OA (osteoarthritis) of knee 04/08/2017   Eustachian tube dysfunction, right 03/19/2017   Mixed conductive and sensorineural hearing loss of right ear with restricted hearing of left ear 03/19/2017   History of bariatric surgery 07/29/2013   Obesity (BMI 30-39.9) 07/29/2013   Osteoporosis 01/28/2013   Allergic rhinitis 01/28/2013   Anemia, iron  deficiency 07/30/2012   Impaired fasting glucose 12/10/2011   Essential hypertension, benign 12/10/2011   Pure hypercholesterolemia 11/15/2010   Insomnia 11/15/2010   Depression, major, in remission (HCC) 11/15/2010   Osteopenia 11/15/2010   History of breast cancer 11/15/2010    PCP: Joselyn Arrow, MD  REFERRING PROVIDER: Rodolph Bong, MD  REFERRING DIAG: Right hip pain  THERAPY DIAG:  Stiffness of right hip, not elsewhere classified  Pain in right hip  Pain in left hip  Stiffness of left hip, not elsewhere classified  Muscle weakness (generalized)  Rationale for Evaluation and Treatment: Rehabilitation  ONSET DATE: Two years   SUBJECTIVE:   SUBJECTIVE STATEMENT: Pt  states minimal change in hips so far. She has mild pain in R QL region today, thinks she slept funny. Has been doing HEP.   Eval: Pt states she has had hip pain for 2 years. Pt states she does her best to not let her pain stop her from activity but it does limit her ability to complete ADLs at her optimal level. Pt is retired and enjoys walking, staying active, and volunteering in her church. Pt has difficulty with walking, navigating stairs, walking her dog, mopping, vacuuming, laying on it, and carrying >1-2#. Pt denies N/T.  PERTINENT HISTORY: OA R hip, osteopenia, osteoporosis, DM, HTN,   PAIN:  Are you having pain? Yes: NPRS scale: 6 constantly /10 Pain location: Bil hips R>L Pain description: stabbing R>L  Aggravating factors: Walking, navigating stairs, walking her dog, mopping, vacuuming, sleeping on her R hip, and carrying > 2# Relieving factors: Heat and ice which help for 30 mins max; Aleve which helps for a few hours  PRECAUTIONS: None  RED FLAGS: None   WEIGHT BEARING RESTRICTIONS: No  FALLS:  Has patient fallen in last 6 months? No  LIVING ENVIRONMENT: Lives with: lives with their family and lives with their spouse Lives in: House/apartment Stairs: Yes: Internal: 1 flight steps;    OCCUPATION: Retired   PLOF: Independent  PATIENT GOALS: Reduce pain with walking, carrying items >2#, navigating stairs, mopping, vacuuming, and walking her dog.  NEXT MD VISIT: January 2025  OBJECTIVE:  Note: Objective measures were completed at Evaluation unless otherwise noted.  DIAGNOSTIC FINDINGS: X-ray showed R hip OA;   PATIENT SURVEYS:  FOTO 52  COGNITION: Overall cognitive status: Within functional limits for tasks assessed       MUSCLE LENGTH: Hamstrings: Right WFL deg; Left WFL deg Piriformis test: Right Min restriction ; Left WFL  Quad: Right Min restriction w/p!; Left min restriction   POSTURE: decreased lumbar lordosis and weight shift  left  PALPATION: TTP R iliac crest/glute & R greater troc  LOWER EXTREMITY MMT:  MMT Right eval Left eval  Hip flexion 4+ 4-  Hip extension    Hip abduction 4- 4-  Hip adduction    Hip internal rotation 3+ 3+  Hip external rotation 3+ 3+  Knee flexion 4+ 4+  Knee extension 5 4+  Ankle dorsiflexion    Ankle plantarflexion    Ankle inversion    Ankle eversion     (Blank rows = not tested) LUMBAR ROM:   Active  A/PROM  eval  Flexion WFL  Extension WFL with p! in R hip  Right lateral flexion WFL with p! in R hip  Left lateral flexion WFL  Right rotation WFL  Left rotation WFL   (Blank rows = not tested)   LOWER EXTREMITY SPECIAL TESTS:    FUNCTIONAL TESTS:  5  times sit to stand: Future session 2 minute walk test: future session  GAIT: Distance walked: 50 ft Assistive device utilized: None Level of assistance: Complete Independence Comments: Min increased frontal plane translation to the L, min reduced trunk rotation   TODAY'S TREATMENT:                                                                                                                              DATE:   04/16/2023 Therapeutic Exercise: Aerobic: Supine:  SLR x 10 bil;  S/L: hip abd 2 x 10 bil;   clams x 10 bil;  Prone: hip ext x 10 bil;  Seated: Standing: Stretches:  Prone quad stretch bil  2x40";  LTR 20 sec x 4;    Supine piriformis stretch Bil 2x30" Neuromuscular Re-education: Manual Therapy: STM/IASTM to R hip, gr troch and glute med/min,  Therapeutic Activity: Self Care:   Therapeutic Exercise: Aerobic: Supine: Prone:  Seated: Standing: Stretches: Prone quad stretch R 2x40"; Leg over bil 2x30"; Supine piriformis stretch Bil 2x30" Neuromuscular Re-education: Manual Therapy: Therapeutic Activity: Self Care:     PATIENT EDUCATION:  PATIENT EDUCATION:  Education details: PT POC, Exam findings, HEP Person educated: Patient Education method: Explanation, Demonstration,  Tactile cues, Verbal cues, and Handouts Education comprehension: verbalized understanding, returned demonstration, verbal cues required, tactile cues required, and needs further education    HOME EXERCISE PROGRAM: Access Code: [email or web address] URL: [email or web address] Date: 04/10/2023 Prepared by: Nechama Guard  Exercises - Supine Piriformis Stretch with Foot on Ground  - 1-2 x daily - 7 x weekly - 2 reps - 30 secs hold - Lower trunk rotation  - 7 x weekly - 3 reps - 30 secs hold - Prone Quadriceps Stretch with Strap  - 1-2 x daily - 7 x weekly - 2 reps - 30 secs hold  ASSESSMENT:  CLINICAL IMPRESSION:  04/16/2023  Pt educated on light strengthening for hips today. Challenged with exercises due to weakness, but with minimal pain. Updated HEP to include. Tenderness In lateral hip with manual today, more in glute min, med, vs gr troch. Plan to progress as tolerated .   Eval: Patient is a 62 y.o.f who was seen today for physical therapy evaluation and treatment for bilateral hip pain (R>L) with signs and symptoms consistent with greater trochanteric pain syndrome. Her primary impairments include hip mobility deficits, hip muscle performance deficits, hip muscle length deficits, and balance deficits. These deficits limit her ability to walk, walk her dog, navigate stairs, carrying items > 2#, mop, and vacuum without pain/limitation. Pt would benefit from skilled PT to address the above impairments.   OBJECTIVE IMPAIRMENTS: Abnormal gait, decreased balance, decreased coordination, decreased endurance, difficulty walking, decreased ROM, decreased strength, hypomobility, impaired flexibility, and pain.   ACTIVITY LIMITATIONS: carrying, lifting, bending, sitting, sleeping, stairs, and locomotion level  PARTICIPATION LIMITATIONS: cleaning, laundry, driving, and community activity  PERSONAL FACTORS: Past/current experiences and 3+ comorbidities: R hip OA, osteopenia, osteoporosis, DM,  and HTN,  are also affecting patient's functional outcome.   REHAB POTENTIAL: Good  CLINICAL DECISION MAKING: Stable/uncomplicated  EVALUATION COMPLEXITY: Low   GOALS: Goals reviewed with patient? Yes  SHORT TERM GOALS: Target date: 05/01/2023 Pt will be independent in initial HEP  Goal status: INITIAL  2.  Pt will report 50% improvement in her ability to mop/vacuum. Goal status: INITIAL   LONG TERM GOALS: Target date: 06/05/2023  Pt will be independent in final HEP.   Goal status: INITIAL  2.  Pt will demo ability to carry at least 10# with pain less than 3/10 to promote return to ADLs. Goal status: INITIAL  3.  Pt will report 75% improvement in the ability to walk for at least 20 mins.  Goal status: INITIAL  4.  Pt will demo improved lumbar AROM to [facility] and without pain/limitation to improve ability to complete ADLs. Goal status: INITIAL  5.  Pt will improve FOTO score by at least 10 points to promote return to ADLs.  Baseline:  Goal status: INITIAL  PLAN:  PT FREQUENCY: 1-2x/week  PT DURATION: 8 weeks  PLANNED INTERVENTIONS: 97110-Therapeutic exercises, 97530- Therapeutic activity, [id number]- Neuromuscular re-education, 97535- Self Care, 16109- Manual therapy, [phone]- Gait training, [phone]- Aquatic Therapy, Patient/Family education, Balance training, Stair training, Taping, Dry Needling, Joint mobilization, Spinal mobilization, Vestibular training, Cryotherapy, and Moist heat  PLAN FOR NEXT SESSION: Assess response to HEP, STM to hips, continue lumbopelvic mobility interventions.   Sedalia Muta, PT 04/16/2023, 10:08 AM

## 2023-04-22 ENCOUNTER — Ambulatory Visit: Payer: 59 | Admitting: Physical Therapy

## 2023-04-22 ENCOUNTER — Encounter: Payer: Self-pay | Admitting: Physical Therapy

## 2023-04-22 DIAGNOSIS — M25652 Stiffness of left hip, not elsewhere classified: Secondary | ICD-10-CM | POA: Diagnosis not present

## 2023-04-22 DIAGNOSIS — M25551 Pain in right hip: Secondary | ICD-10-CM

## 2023-04-22 DIAGNOSIS — M25651 Stiffness of right hip, not elsewhere classified: Secondary | ICD-10-CM

## 2023-04-22 DIAGNOSIS — M6281 Muscle weakness (generalized): Secondary | ICD-10-CM

## 2023-04-22 NOTE — Therapy (Cosign Needed)
OUTPATIENT PHYSICAL THERAPY LOWER EXTREMITY Treatment   Patient Name: Julie Fischer MRN: [id number] DOB:07/19/1960, 62 y.o., female Today's Date: 04/22/2023  END OF SESSION:  PT End of Session - 04/22/23 1011     Visit Number 3    Number of Visits 16    Date for PT Re-Evaluation 06/05/23    Authorization Type Aetna    PT Start Time 1013    PT Stop Time 1055    PT Time Calculation (min) 42 min    Activity Tolerance Patient tolerated treatment well;No increased pain    Behavior During Therapy [facility] for tasks assessed/performed              Past Medical History:  Diagnosis Date   Allergy    fall seasonal   Anxiety    Blood transfusion    Platelet transfusion when on heparin   Breast cancer (HCC) 2001   R breast(lumpectomy,chemo,radiation,tamoxifen)   C. difficile diarrhea 02/2016   treated with Flagyl   Complication of anesthesia    gets Blanca Friend going under she has been told   COVID-19 09/2020   Depression    treated   Diabetes mellitus    type 2   GERD (gastroesophageal reflux disease)    Hyperlipidemia    Hypertension    resolved after weight loss surgery; recurred 2013   Insomnia chronic   Iron deficiency    h/o   Microalbuminuria    h/o   OA (osteoarthritis) of knee    Osteoporosis    Personal history of chemotherapy    Personal history of radiation therapy    Plantar fasciitis (07') DrRegal   s/p surgical release 01/2011   Sleep apnea    resolved after weight loss surgery; recurrent with weight gain; on CPAP   Vitamin D deficiency    Past Surgical History:  Procedure Laterality Date   APPENDECTOMY     BREAST LUMPECTOMY Right 11/1999   COLONOSCOPY  2002, 2012   cuboid stress fracture Right 12/2002   FOOT SURGERY Left 2008   GASTRIC ROUX-EN-Y  (DrNewman) 3/04   left shoulder fracture repair  (DrMortensen) 12/05   MYRINGOTOMY     tubes B/L, multiple sets   plantar fasciitis release Right 01/30/2011   Dr. Charlsie Merles   TONSILLECTOMY AND  ADENOIDECTOMY     TOTAL KNEE ARTHROPLASTY Left 04/08/2017   Procedure: LEFT TOTAL KNEE ARTHROPLASTY;  Surgeon: Ollen Gross, MD;  Location: [facility];  Service: Orthopedics;  Laterality: Left;   TOTAL KNEE ARTHROPLASTY Right 01/06/2018   Procedure: RIGHT TOTAL KNEE ARTHROPLASTY;  Surgeon: Ollen Gross, MD;  Location: [facility];  Service: Orthopedics;  Laterality: Right;   TOTAL VAGINAL HYSTERECTOMY  2000   and RSO(endometriosis)   Patient Active Problem List   Diagnosis Date Noted   Right hip pain 05/25/2020   Fall 05/25/2020   Frequent urination 04/10/2019   Dysuria 04/10/2019   Vaginal dryness 04/10/2019   Vaginitis, atrophic 04/10/2019   Post-menopausal 04/10/2019   S/P hysterectomy 04/10/2019   Stiffness of right knee 01/10/2018   Family hx of colon cancer 06/05/2017   S/P TKR (total knee replacement), left 06/05/2017   Depressive disorder 05/24/2017   OA (osteoarthritis) of knee 04/08/2017   Eustachian tube dysfunction, right 03/19/2017   Mixed conductive and sensorineural hearing loss of right ear with restricted hearing of left ear 03/19/2017   History of bariatric surgery 07/29/2013   Obesity (BMI 30-39.9) 07/29/2013   Osteoporosis 01/28/2013   Allergic rhinitis 01/28/2013   Anemia,  iron deficiency 07/30/2012   Impaired fasting glucose 12/10/2011   Essential hypertension, benign 12/10/2011   Pure hypercholesterolemia 11/15/2010   Insomnia 11/15/2010   Depression, major, in remission (HCC) 11/15/2010   Osteopenia 11/15/2010   History of breast cancer 11/15/2010    PCP: Joselyn Arrow, MD  REFERRING PROVIDER: Rodolph Bong, MD  REFERRING DIAG: Right hip pain  THERAPY DIAG:  Pain in right hip  Stiffness of left hip, not elsewhere classified  Muscle weakness (generalized)  Stiffness of right hip, not elsewhere classified  Rationale for Evaluation and Treatment: Rehabilitation  ONSET DATE: Two years   SUBJECTIVE:   SUBJECTIVE STATEMENT:  Pt states she has  pain in her mid R thoracic spine and her hips. Pt ranks her R hip pain 6/10 and her L 2/10 pain. Has been using a heating pad at home to decrease R thoracic back pain.    Eval: Pt states she has had hip pain for 2 years. Pt states she does her best to not let her pain stop her from activity but it does limit her ability to complete ADLs at her optimal level. Pt is retired and enjoys walking, staying active, and volunteering in her church. Pt has difficulty with walking, navigating stairs, walking her dog, mopping, vacuuming, laying on it, and carrying >1-2#. Pt denies N/T.  PERTINENT HISTORY: OA R hip, osteopenia, osteoporosis, DM, HTN,   PAIN:  Are you having pain? Yes: NPRS scale: 6 constantly /10 Pain location: Bil hips R>L Pain description: stabbing R>L  Aggravating factors: Walking, navigating stairs, walking her dog, mopping, vacuuming, sleeping on her R hip, and carrying > 2# Relieving factors: Heat and ice which help for 30 mins max; Aleve which helps for a few hours  PRECAUTIONS: None  RED FLAGS: None   WEIGHT BEARING RESTRICTIONS: No  FALLS:  Has patient fallen in last 6 months? No  LIVING ENVIRONMENT: Lives with: lives with their family and lives with their spouse Lives in: House/apartment Stairs: Yes: Internal: 1 flight steps;    OCCUPATION: Retired   PLOF: Independent  PATIENT GOALS: Reduce pain with walking, carrying items >2#, navigating stairs, mopping, vacuuming, and walking her dog.  NEXT MD VISIT: January 2025  OBJECTIVE:  Note: Objective measures were completed at Evaluation unless otherwise noted.  DIAGNOSTIC FINDINGS: X-ray showed R hip OA;   PATIENT SURVEYS:  FOTO 52  COGNITION: Overall cognitive status: Within functional limits for tasks assessed      MUSCLE LENGTH: Hamstrings: Right WFL deg; Left WFL deg Piriformis test: Right Min restriction ; Left WFL  Quad: Right Min restriction w/p!; Left min restriction   POSTURE: decreased lumbar  lordosis and weight shift left  PALPATION: TTP R iliac crest/glute & R greater troc  LOWER EXTREMITY MMT:  MMT Right eval Left eval  Hip flexion 4+ 4-  Hip extension    Hip abduction 4- 4-  Hip adduction    Hip internal rotation 3+ 3+  Hip external rotation 3+ 3+  Knee flexion 4+ 4+  Knee extension 5 4+  Ankle dorsiflexion    Ankle plantarflexion    Ankle inversion    Ankle eversion     (Blank rows = not tested) LUMBAR ROM:   Active  A/PROM  eval  Flexion WFL  Extension WFL with p! in R hip  Right lateral flexion WFL with p! in R hip  Left lateral flexion WFL  Right rotation WFL  Left rotation WFL   (Blank rows = not tested)  LOWER EXTREMITY SPECIAL TESTS:    FUNCTIONAL TESTS:  5 times sit to stand: Future session 2 minute walk test: future session  GAIT: Distance walked: 50 ft Assistive device utilized: None Level of assistance: Complete Independence Comments: Min increased frontal plane translation to the L, min reduced trunk rotation   TODAY'S TREATMENT:                                                                                                                              DATE:   04/22/2023  Therapeutic Exercise: Aerobic: Supine:  SLR x 15 bil;  S/L: hip abd 2 x 10 bil;   Clams x 10 bil;  Prone: Hip ext x 10 bil;  Seated: Standing: Standing QL stretch R, 3x30" Stretches:  Prone quad stretch bil  2x40";  LTR 20 sec x 4;    Supine piriformis stretch Bil 2x30" Neuromuscular Re-education: Manual Therapy: STM/IASTM to R hip, gr troch and glute med/min,  Therapeutic Activity: Self Care:      Previous  Therapeutic Exercise: Aerobic: Supine:  SLR x 10 bil;  S/L: hip abd 2 x 10 bil;   clams x 10 bil;  Prone: hip ext x 10 bil;  Seated: Standing: Stretches:  Prone quad stretch bil  2x40";  LTR 20 sec x 4;    Supine piriformis stretch Bil 2x30" Neuromuscular Re-education: Manual Therapy: STM/IASTM to R hip, gr troch and glute med/min,   Therapeutic Activity: Self Care:   Therapeutic Exercise: Aerobic: Supine: Prone:  Seated: Standing: Stretches: Prone quad stretch R 2x40"; Leg over bil 2x30"; Supine piriformis stretch Bil 2x30" Neuromuscular Re-education: Manual Therapy: Therapeutic Activity: Self Care:     PATIENT EDUCATION:  PATIENT EDUCATION:  Education details: PT POC, Exam findings, HEP Person educated: Patient Education method: Explanation, Demonstration, Tactile cues, Verbal cues, and Handouts Education comprehension: verbalized understanding, returned demonstration, verbal cues required, tactile cues required, and needs further education    HOME EXERCISE PROGRAM: Access Code: [email or web address] URL: [email or web address] Date: 04/10/2023 Prepared by: Nechama Guard  Exercises - Supine Piriformis Stretch with Foot on Ground  - 1-2 x daily - 7 x weekly - 2 reps - 30 secs hold - Lower trunk rotation  - 7 x weekly - 3 reps - 30 secs hold - Prone Quadriceps Stretch with Strap  - 1-2 x daily - 7 x weekly - 2 reps - 30 secs hold  ASSESSMENT:  CLINICAL IMPRESSION:  04/22/2023  Session focused on manual therapy and ther ex to promote improved lumbopelvic mobility. Pt able to progress SLR by increasing her number of reps without compensations. Pt had some difficulty maintaining proper positioning during sagittal and frontal hip NMRE and required TC/VC secondary to decreased muscular control and strength. Pt continues to present with tenderness in lateral hip and R mid thoracic which decreased slightly following manual therapy. Continue to address lumbopelvic mobility, NMRE, and strengthening as tolerated.  .   Eval: Patient is a 62 y.o.f  who was seen today for physical therapy evaluation and treatment for bilateral hip pain (R>L) with signs and symptoms consistent with greater trochanteric pain syndrome. Her primary impairments include hip mobility deficits, hip muscle performance deficits, hip muscle  length deficits, and balance deficits. These deficits limit her ability to walk, walk her dog, navigate stairs, carrying items > 2#, mop, and vacuum without pain/limitation. Pt would benefit from skilled PT to address the above impairments.   OBJECTIVE IMPAIRMENTS: Abnormal gait, decreased balance, decreased coordination, decreased endurance, difficulty walking, decreased ROM, decreased strength, hypomobility, impaired flexibility, and pain.   ACTIVITY LIMITATIONS: carrying, lifting, bending, sitting, sleeping, stairs, and locomotion level  PARTICIPATION LIMITATIONS: cleaning, laundry, driving, and community activity  PERSONAL FACTORS: Past/current experiences and 3+ comorbidities: R hip OA, osteopenia, osteoporosis, DM, and HTN,  are also affecting patient's functional outcome.   REHAB POTENTIAL: Good  CLINICAL DECISION MAKING: Stable/uncomplicated  EVALUATION COMPLEXITY: Low   GOALS: Goals reviewed with patient? Yes  SHORT TERM GOALS: Target date: 05/01/2023 Pt will be independent in initial HEP  Goal status: INITIAL  2.  Pt will report 50% improvement in her ability to mop/vacuum. Goal status: INITIAL   LONG TERM GOALS: Target date: 06/05/2023  Pt will be independent in final HEP.   Goal status: INITIAL  2.  Pt will demo ability to carry at least 10# with pain less than 3/10 to promote return to ADLs. Goal status: INITIAL  3.  Pt will report 75% improvement in the ability to walk for at least 20 mins.  Goal status: INITIAL  4.  Pt will demo improved lumbar AROM to [facility] and without pain/limitation to improve ability to complete ADLs. Goal status: INITIAL  5.  Pt will improve FOTO score by at least 10 points to promote return to ADLs.  Baseline:  Goal status: INITIAL  PLAN:  PT FREQUENCY: 1-2x/week  PT DURATION: 8 weeks  PLANNED INTERVENTIONS: 97110-Therapeutic exercises, 97530- Therapeutic activity, [id number]- Neuromuscular re-education, 97535- Self Care, [id number]- Manual  therapy, [phone]- Gait training, [phone]- Aquatic Therapy, Patient/Family education, Balance training, Stair training, Taping, Dry Needling, Joint mobilization, Spinal mobilization, Vestibular training, Cryotherapy, and Moist heat  PLAN FOR NEXT SESSION: Assess response to HEP, STM to hips, continue lumbopelvic mobility interventions.   [address] [place], Student-PT 04/22/2023, 11:18 AM   This entire session was performed under direct supervision and direction of a licensed therapist/therapist assistant . I have personally read, edited and approve of the note as written.  Sedalia Muta, PT, DPT 2:51 PM  04/22/23

## 2023-04-24 ENCOUNTER — Encounter: Payer: Self-pay | Admitting: Physical Therapy

## 2023-04-24 ENCOUNTER — Ambulatory Visit: Payer: 59 | Admitting: Physical Therapy

## 2023-04-24 DIAGNOSIS — M6281 Muscle weakness (generalized): Secondary | ICD-10-CM

## 2023-04-24 DIAGNOSIS — M25552 Pain in left hip: Secondary | ICD-10-CM

## 2023-04-24 DIAGNOSIS — M25551 Pain in right hip: Secondary | ICD-10-CM | POA: Diagnosis not present

## 2023-04-24 DIAGNOSIS — M25652 Stiffness of left hip, not elsewhere classified: Secondary | ICD-10-CM

## 2023-04-24 DIAGNOSIS — M25651 Stiffness of right hip, not elsewhere classified: Secondary | ICD-10-CM | POA: Diagnosis not present

## 2023-04-24 NOTE — Therapy (Unsigned)
OUTPATIENT PHYSICAL THERAPY LOWER EXTREMITY Treatment   Patient Name: Julie Fischer MRN: [id number] DOB:[birth year], 62 y.o., female Today's Date: 04/24/2023  END OF SESSION:  PT End of Session - 04/24/23 1101     Visit Number 4    Number of Visits 16    Date for PT Re-Evaluation 06/05/23    Authorization Type Aet[employer]    PT Start Time 1104    PT Stop Time 1145    PT Time Calculation (min) 41 min    Activity Tolerance Patient tolerated treatment well;No increased pain    Behavior During Therapy [facility] for tasks assessed/performed               Past Medical History:  Diagnosis Date   Allergy    fall seasonal   Anxiety    Blood transfusion    Platelet transfusion when on heparin   Breast cancer (HCC) 2001   R breast(lumpectomy,chemo,radiation,tamoxifen)   C. difficile diarrhea 02/2016   treated with Flagyl   Complication of anesthesia    gets Blanca Friend going under she has been told   COVID-19 09/2020   Depression    treated   Diabetes mellitus    type 2   GERD (gastroesophageal reflux disease)    Hyperlipidemia    Hypertension    resolved after weight loss surgery; recurred 2013   Insomnia chronic   Iron deficiency    h/o   Microalbuminuria    h/o   OA (osteoarthritis) of knee    Osteoporosis    Personal history of chemotherapy    Personal history of radiation therapy    Plantar fasciitis (07') DrRegal   s/p surgical release 01/2011   Sleep apnea    resolved after weight loss surgery; recurrent with weight gain; on CPAP   Vitamin D deficiency    Past Surgical History:  Procedure Laterality Date   APPENDECTOMY     BREAST LUMPECTOMY Right 11/1999   COLONOSCOPY  2002, 2012   cuboid stress fracture Right 12/2002   FOOT SURGERY Left 2008   GASTRIC ROUX-EN-Y  (DrNewman) 3/04   left shoulder fracture repair  (DrMortensen) 12/05   MYRINGOTOMY     tubes B/L, multiple sets   plantar fasciitis release Right 01/30/2011   Dr. Charlsie Merles   TONSILLECTOMY AND  ADENOIDECTOMY     TOTAL KNEE ARTHROPLASTY Left 04/08/2017   Procedure: LEFT TOTAL KNEE ARTHROPLASTY;  Surgeon: Ollen Gross, MD;  Location: [facility];  Service: Orthopedics;  Laterality: Left;   TOTAL KNEE ARTHROPLASTY Right 01/06/2018   Procedure: RIGHT TOTAL KNEE ARTHROPLASTY;  Surgeon: Ollen Gross, MD;  Location: [facility];  Service: Orthopedics;  Laterality: Right;   TOTAL VAGINAL HYSTERECTOMY  2000   and RSO(endometriosis)   Patient Active Problem List   Diagnosis Date Noted   Right hip pain 05/25/2020   Fall 05/25/2020   Frequent urination 04/10/2019   Dysuria 04/10/2019   Vaginal dryness 04/10/2019   Vaginitis, atrophic 04/10/2019   Post-menopausal 04/10/2019   S/P hysterectomy 04/10/2019   Stiffness of right knee 01/10/2018   Family hx of colon cancer 06/05/2017   S/P TKR (total knee replacement), left 06/05/2017   Depressive disorder 05/24/2017   OA (osteoarthritis) of knee 04/08/2017   Eustachian tube dysfunction, right 03/19/2017   Mixed conductive and sensorineural hearing loss of right ear with restricted hearing of left ear 03/19/2017   History of bariatric surgery 07/29/2013   Obesity (BMI 30-39.9) 07/29/2013   Osteoporosis 01/28/2013   Allergic rhinitis 01/28/2013  Anemia, iron deficiency 07/30/2012   Impaired fasting glucose 12/10/2011   Essential hypertension, benign 12/10/2011   Pure hypercholesterolemia 11/15/2010   Insomnia 11/15/2010   Depression, major, in remission (HCC) 11/15/2010   Osteopenia 11/15/2010   History of breast cancer 11/15/2010    PCP: Joselyn Arrow, MD  REFERRING PROVIDER: Rodolph Bong, MD  REFERRING DIAG: Right hip pain  THERAPY DIAG:  Pain in right hip  Stiffness of left hip, not elsewhere classified  Stiffness of right hip, not elsewhere classified  Muscle weakness (generalized)  Pain in left hip  Rationale for Evaluation and Treatment: Rehabilitation  ONSET DATE: Two years   SUBJECTIVE:   SUBJECTIVE STATEMENT:   Pt states she was carrying water in her L UE and had pain in her R hip. Says her R hip pain has decreased to 5/10    Eval: Pt states she has had hip pain for 2 years. Pt states she does her best to not let her pain stop her from activity but it does limit her ability to complete ADLs at her optimal level. Pt is retired and enjoys walking, staying active, and volunteering in her church. Pt has difficulty with walking, navigating stairs, walking her dog, mopping, vacuuming, laying on it, and carrying >1-2#. Pt denies N/T.  PERTINENT HISTORY: OA R hip, osteopenia, osteoporosis, DM, HTN,   PAIN:  Are you having pain? Yes: NPRS scale: 6 constantly /10 Pain location: Bil hips R>L Pain description: stabbing R>L  Aggravating factors: Walking, navigating stairs, walking her dog, mopping, vacuuming, sleeping on her R hip, and carrying > 2# Relieving factors: Heat and ice which help for 30 mins max; Aleve which helps for a few hours  PRECAUTIONS: None  RED FLAGS: None   WEIGHT BEARING RESTRICTIONS: No  FALLS:  Has patient fallen in last 6 months? No  LIVING ENVIRONMENT: Lives with: lives with their family and lives with their spouse Lives in: House/apartment Stairs: Yes: Internal: 1 flight steps;    OCCUPATION: Retired   PLOF: Independent  PATIENT GOALS: Reduce pain with walking, carrying items >2#, navigating stairs, mopping, vacuuming, and walking her dog.  NEXT MD VISIT: January 2025  OBJECTIVE:  Note: Objective measures were completed at Evaluation unless otherwise noted.  DIAGNOSTIC FINDINGS: X-ray showed R hip OA;   PATIENT SURVEYS:  FOTO 52  COGNITION: Overall cognitive status: Within functional limits for tasks assessed      MUSCLE LENGTH: Hamstrings: Right WFL deg; Left WFL deg Piriformis test: Right Min restriction ; Left WFL  Quad: Right Min restriction w/p!; Left min restriction   POSTURE: decreased lumbar lordosis and weight shift left  PALPATION: TTP R  iliac crest/glute & R greater troc  LOWER EXTREMITY MMT:  MMT Right eval Left eval  Hip flexion 4+ 4-  Hip extension    Hip abduction 4- 4-  Hip adduction    Hip internal rotation 3+ 3+  Hip external rotation 3+ 3+  Knee flexion 4+ 4+  Knee extension 5 4+  Ankle dorsiflexion    Ankle plantarflexion    Ankle inversion    Ankle eversion     (Blank rows = not tested) LUMBAR ROM:   Active  A/PROM  eval  Flexion WFL  Extension WFL with p! in R hip  Right lateral flexion WFL with p! in R hip  Left lateral flexion WFL  Right rotation WFL  Left rotation WFL   (Blank rows = not tested)   LOWER EXTREMITY SPECIAL TESTS:  FUNCTIONAL TESTS:  5 times sit to stand: Future session 2 minute walk test: future session  GAIT: Distance walked: 50 ft Assistive device utilized: None Level of assistance: Complete Independence Comments: Min increased frontal plane translation to the L, min reduced trunk rotation   TODAY'S TREATMENT:                                                                                                                              DATE:   04/24/2023  Therapeutic Exercise: Aerobic: Supine:  SLR x 15 bil;  S/L: hip abd 2 x 10 bil;    Prone: Hip ext x 10 bil;  Seated: Standing: Standing QL stretch R, 3x30" Stretches:   LTR 20 sec x 4;   Supine piriformis stretch Bil 2x30"; SKTC bil 2x30" Neuromuscular Re-education: Bridges 2x12; Clams 2x10 bil;  Manual Therapy: STM/IASTM to R hip, gr troch and glute med/min,  Therapeutic Activity: Self Care:      Previous  Therapeutic Exercise: Aerobic: Supine:  SLR x 15 bil;  S/L: hip abd 2 x 10 bil;   Clams 2x10 bil;  Prone: Hip ext x 10 bil;  Seated: Standing: Standing QL stretch R, 3x30" Stretches:  Prone quad stretch bil  2x40";  LTR 20 sec x 4;    Supine piriformis stretch Bil 2x30" Neuromuscular Re-education: Manual Therapy: STM/IASTM to R hip, gr troch and glute med/min,  Therapeutic  Activity: Self Care:   Therapeutic Exercise: Aerobic: Supine:  SLR x 10 bil;  S/L: hip abd 2 x 10 bil;   clams x 10 bil;  Prone: hip ext x 10 bil;  Seated: Standing: Stretches:  Prone quad stretch bil  2x40";  LTR 20 sec x 4;    Supine piriformis stretch Bil 2x30" Neuromuscular Re-education: Manual Therapy: STM/IASTM to R hip, gr troch and glute med/min,  Therapeutic Activity: Self Care:   Therapeutic Exercise: Aerobic: Supine: Prone:  Seated: Standing: Stretches: Prone quad stretch R 2x40"; Leg over bil 2x30"; Supine piriformis stretch Bil 2x30" Neuromuscular Re-education: Manual Therapy: Therapeutic Activity: Self Care:     PATIENT EDUCATION:  PATIENT EDUCATION:  Education details: PT POC, Exam findings, HEP Person educated: Patient Education method: Explanation, Demonstration, Tactile cues, Verbal cues, and Handouts Education comprehension: verbalized understanding, returned demonstration, verbal cues required, tactile cues required, and needs further education    HOME EXERCISE PROGRAM: Access Code: [email or web address] URL: [email or web address] Date: 04/24/2023 Prepared by: Nechama Guard  Exercises - Supine Piriformis Stretch with Foot on Ground  - 1-2 x daily - 7 x weekly - 2 reps - 30 secs hold - Supine Lower Trunk Rotation  - 2 x daily - 10 reps - 5 hold - Prone Quadriceps Stretch with Strap  - 1-2 x daily - 7 x weekly - 2 reps - 30 secs hold - Straight Leg Raise  - 1 x daily - 1-2 sets - 10 reps - Sidelying Hip Abduction  - 1 x daily - 1-2  sets - 10 reps - Clamshell  - 1 x daily - 1 sets - 10 reps  ASSESSMENT:  CLINICAL IMPRESSION:  04/24/2023  Session focused on ther ex, NMRE to promote glute activation required for standing/dynamic tasks, and manual therapy. Pt presented with decreased tenderness in R hip and thoracic. Pt able to progress clam shells by increasing the number of reps and eccentric control. Pt continues to have difficulty with  eccentric control during sidelying hip abd & glutes secondary to muscular control, strength, and fatigue -although steady progress is being made. Continue to progress global LE strengthening and NMRE to weightbearing positions as tolerated.    Eval: Patient is a 62 y.o.f who was seen today for physical therapy evaluation and treatment for bilateral hip pain (R>L) with signs and symptoms consistent with greater trochanteric pain syndrome. Her primary impairments include hip mobility deficits, hip muscle performance deficits, hip muscle length deficits, and balance deficits. These deficits limit her ability to walk, walk her dog, navigate stairs, carrying items > 2#, mop, and vacuum without pain/limitation. Pt would benefit from skilled PT to address the above impairments.   OBJECTIVE IMPAIRMENTS: Abnormal gait, decreased balance, decreased coordination, decreased endurance, difficulty walking, decreased ROM, decreased strength, hypomobility, impaired flexibility, and pain.   ACTIVITY LIMITATIONS: carrying, lifting, bending, sitting, sleeping, stairs, and locomotion level  PARTICIPATION LIMITATIONS: cleaning, laundry, driving, and community activity  PERSONAL FACTORS: Past/current experiences and 3+ comorbidities: R hip OA, osteopenia, osteoporosis, DM, and HTN,  are also affecting patient's functional outcome.   REHAB POTENTIAL: Good  CLINICAL DECISION MAKING: Stable/uncomplicated  EVALUATION COMPLEXITY: Low   GOALS: Goals reviewed with patient? Yes  SHORT TERM GOALS: Target date: 05/01/2023 Pt will be independent in initial HEP  Goal status: INITIAL  2.  Pt will report 50% improvement in her ability to mop/vacuum. Goal status: INITIAL   LONG TERM GOALS: Target date: 06/05/2023  Pt will be independent in final HEP.   Goal status: INITIAL  2.  Pt will demo ability to carry at least 10# with pain less than 3/10 to promote return to ADLs. Goal status: INITIAL  3.  Pt will report 75%  improvement in the ability to walk for at least 20 mins.  Goal status: INITIAL  4.  Pt will demo improved lumbar AROM to [facility] and without pain/limitation to improve ability to complete ADLs. Goal status: INITIAL  5.  Pt will improve FOTO score by at least 10 points to promote return to ADLs.  Baseline:  Goal status: INITIAL  PLAN:  PT FREQUENCY: 1-2x/week  PT DURATION: 8 weeks  PLANNED INTERVENTIONS: 97110-Therapeutic exercises, 97530- Therapeutic activity, [id number]- Neuromuscular re-education, 97535- Self Care, 16109- Manual therapy, [phone]- Gait training, [phone]- Aquatic Therapy, Patient/Family education, Balance training, Stair training, Taping, Dry Needling, Joint mobilization, Spinal mobilization, Vestibular training, Cryotherapy, and Moist heat  PLAN FOR NEXT SESSION: Assess response to HEP, STM to hips, continue lumbopelvic mobility interventions.   [address] [place], Student-PT 04/24/2023, 11:06 AM   This entire session was performed under direct supervision and direction of a licensed therapist/therapist assistant . I have personally read, edited and approve of the note as written.  Sedalia Muta, PT, DPT 11:06 AM  04/24/23

## 2023-04-29 ENCOUNTER — Encounter: Payer: Self-pay | Admitting: Physical Therapy

## 2023-04-29 ENCOUNTER — Ambulatory Visit: Payer: 59 | Admitting: Physical Therapy

## 2023-04-29 DIAGNOSIS — M25652 Stiffness of left hip, not elsewhere classified: Secondary | ICD-10-CM | POA: Diagnosis not present

## 2023-04-29 DIAGNOSIS — M25551 Pain in right hip: Secondary | ICD-10-CM

## 2023-04-29 DIAGNOSIS — M25651 Stiffness of right hip, not elsewhere classified: Secondary | ICD-10-CM | POA: Diagnosis not present

## 2023-04-29 NOTE — Therapy (Signed)
OUTPATIENT PHYSICAL THERAPY LOWER EXTREMITY Treatment   Patient Name: Julie Fischer MRN: [id number] DOB:1961/05/14, 62 y.o., female Today's Date: 04/29/2023  END OF SESSION:  PT End of Session - 04/29/23 0942     Visit Number 5    Number of Visits 16    Date for PT Re-Evaluation 06/05/23    Authorization Type Aetna    PT Start Time [phone]    PT Stop Time 1016    PT Time Calculation (min) 38 min    Activity Tolerance Patient tolerated treatment well;No increased pain    Behavior During Therapy [facility] for tasks assessed/performed                Past Medical History:  Diagnosis Date   Allergy    fall seasonal   Anxiety    Blood transfusion    Platelet transfusion when on heparin   Breast cancer (HCC) 2001   R breast(lumpectomy,chemo,radiation,tamoxifen)   C. difficile diarrhea 02/2016   treated with Flagyl   Complication of anesthesia    gets Blanca Friend going under she has been told   COVID-19 09/2020   Depression    treated   Diabetes mellitus    type 2   GERD (gastroesophageal reflux disease)    Hyperlipidemia    Hypertension    resolved after weight loss surgery; recurred 2013   Insomnia chronic   Iron deficiency    h/o   Microalbuminuria    h/o   OA (osteoarthritis) of knee    Osteoporosis    Personal history of chemotherapy    Personal history of radiation therapy    Plantar fasciitis (07') DrRegal   s/p surgical release 01/2011   Sleep apnea    resolved after weight loss surgery; recurrent with weight gain; on CPAP   Vitamin D deficiency    Past Surgical History:  Procedure Laterality Date   APPENDECTOMY     BREAST LUMPECTOMY Right 11/1999   COLONOSCOPY  2002, 2012   cuboid stress fracture Right 12/2002   FOOT SURGERY Left 2008   GASTRIC ROUX-EN-Y  (DrNewman) 3/04   left shoulder fracture repair  (DrMortensen) 12/05   MYRINGOTOMY     tubes B/L, multiple sets   plantar fasciitis release Right 01/30/2011   Dr. Charlsie Merles   TONSILLECTOMY AND  ADENOIDECTOMY     TOTAL KNEE ARTHROPLASTY Left 04/08/2017   Procedure: LEFT TOTAL KNEE ARTHROPLASTY;  Surgeon: Ollen Gross, MD;  Location: [facility];  Service: Orthopedics;  Laterality: Left;   TOTAL KNEE ARTHROPLASTY Right 01/06/2018   Procedure: RIGHT TOTAL KNEE ARTHROPLASTY;  Surgeon: Ollen Gross, MD;  Location: [facility];  Service: Orthopedics;  Laterality: Right;   TOTAL VAGINAL HYSTERECTOMY  2000   and RSO(endometriosis)   Patient Active Problem List   Diagnosis Date Noted   Right hip pain 05/25/2020   Fall 05/25/2020   Frequent urination 04/10/2019   Dysuria 04/10/2019   Vaginal dryness 04/10/2019   Vaginitis, atrophic 04/10/2019   Post-menopausal 04/10/2019   S/P hysterectomy 04/10/2019   Stiffness of right knee 01/10/2018   Family hx of colon cancer 06/05/2017   S/P TKR (total knee replacement), left 06/05/2017   Depressive disorder 05/24/2017   OA (osteoarthritis) of knee 04/08/2017   Eustachian tube dysfunction, right 03/19/2017   Mixed conductive and sensorineural hearing loss of right ear with restricted hearing of left ear 03/19/2017   History of bariatric surgery 07/29/2013   Obesity (BMI 30-39.9) 07/29/2013   Osteoporosis 01/28/2013   Allergic rhinitis 01/28/2013  Anemia, iron deficiency 07/30/2012   Impaired fasting glucose 12/10/2011   Essential hypertension, benign 12/10/2011   Pure hypercholesterolemia 11/15/2010   Insomnia 11/15/2010   Depression, major, in remission (HCC) 11/15/2010   Osteopenia 11/15/2010   History of breast cancer 11/15/2010    PCP: Joselyn Arrow, MD  REFERRING PROVIDER: Rodolph Bong, MD  REFERRING DIAG: Right hip pain  THERAPY DIAG:  Pain in right hip  Stiffness of left hip, not elsewhere classified  Stiffness of right hip, not elsewhere classified  Rationale for Evaluation and Treatment: Rehabilitation  ONSET DATE: Two years   SUBJECTIVE:   SUBJECTIVE STATEMENT:  Pt states increased pain on R side of t-spine/into R  ribs. Has been sore, but increased last night and this AM.    Eval: Pt states she has had hip pain for 2 years. Pt states she does her best to not let her pain stop her from activity but it does limit her ability to complete ADLs at her optimal level. Pt is retired and enjoys walking, staying active, and volunteering in her church. Pt has difficulty with walking, navigating stairs, walking her dog, mopping, vacuuming, laying on it, and carrying >1-2#. Pt denies N/T.  PERTINENT HISTORY: OA R hip, osteopenia, osteoporosis, DM, HTN,   PAIN:  Are you having pain? Yes: NPRS scale: 6 constantly /10 Pain location: Bil hips R>L Pain description: stabbing R>L  Aggravating factors: Walking, navigating stairs, walking her dog, mopping, vacuuming, sleeping on her R hip, and carrying > 2# Relieving factors: Heat and ice which help for 30 mins max; Aleve which helps for a few hours  PRECAUTIONS: None  RED FLAGS: None   WEIGHT BEARING RESTRICTIONS: No  FALLS:  Has patient fallen in last 6 months? No  LIVING ENVIRONMENT: Lives with: lives with their family and lives with their spouse Lives in: House/apartment Stairs: Yes: Internal: 1 flight steps;    OCCUPATION: Retired   PLOF: Independent  PATIENT GOALS: Reduce pain with walking, carrying items >2#, navigating stairs, mopping, vacuuming, and walking her dog.  NEXT MD VISIT: January 2025  OBJECTIVE:  Note: Objective measures were completed at Evaluation unless otherwise noted.  DIAGNOSTIC FINDINGS: X-ray showed R hip OA;   PATIENT SURVEYS:  FOTO 52  COGNITION: Overall cognitive status: Within functional limits for tasks assessed      MUSCLE LENGTH: Hamstrings: Right WFL deg; Left WFL deg Piriformis test: Right Min restriction ; Left WFL  Quad: Right Min restriction w/p!; Left min restriction   POSTURE: decreased lumbar lordosis and weight shift left  PALPATION: TTP R iliac crest/glute & R greater troc  LOWER EXTREMITY  MMT:  MMT Right eval Left eval  Hip flexion 4+ 4-  Hip extension    Hip abduction 4- 4-  Hip adduction    Hip internal rotation 3+ 3+  Hip external rotation 3+ 3+  Knee flexion 4+ 4+  Knee extension 5 4+  Ankle dorsiflexion    Ankle plantarflexion    Ankle inversion    Ankle eversion     (Blank rows = not tested) LUMBAR ROM:   Active  A/PROM  eval  Flexion WFL  Extension WFL with p! in R hip  Right lateral flexion WFL with p! in R hip  Left lateral flexion WFL  Right rotation WFL  Left rotation WFL   (Blank rows = not tested)   LOWER EXTREMITY SPECIAL TESTS:    FUNCTIONAL TESTS:  5 times sit to stand: Future session 2 minute walk test:  future session  GAIT: Distance walked: 50 ft Assistive device utilized: None Level of assistance: Complete Independence Comments: Min increased frontal plane translation to the L, min reduced trunk rotation   TODAY'S TREATMENT:                                                                                                                              DATE:   04/29/2023 Therapeutic Exercise: Aerobic: Supine:  S/L:  hip abd 2 x 10 bil;   clams 2 x 10 bil;  Prone:  Seated: thoracic extension with towel roll x 10/slow ;  seated thoracic/lumbar flexion 20 sec x 3;  Standing: Side stepping 10 ft x 6 bil;  hip abd and ext 2 x 10 ea bil;  Stretches:   LTR 20 sec x 4;   Supine piriformis stretch Bil 2x30"; SKTC bil 2x30" Neuromuscular Re-education:    Manual Therapy:   PA mobs to t-spine; R lower rib mobs; Seated: therapist assisted thoracic extension x 10;  Therapeutic Activity: Self Care:    Previous Therapeutic Exercise: Aerobic: Supine:  SLR x 15 bil;  S/L: hip abd 2 x 10 bil;   Clams 2x10 bil;  Prone: Hip ext x 10 bil;  Seated: Standing: Standing QL stretch R, 3x30" Stretches:  Prone quad stretch bil  2x40";  LTR 20 sec x 4;    Supine piriformis stretch Bil 2x30" Neuromuscular Re-education: Manual Therapy: STM/IASTM  to R hip, gr troch and glute med/min  Therapeutic Activity: Self Care:   Therapeutic Exercise: Aerobic: Supine:  SLR x 10 bil;  S/L: hip abd 2 x 10 bil;   clams x 10 bil;  Prone: hip ext x 10 bil;  Seated: Standing: Stretches:  Prone quad stretch bil  2x40";  LTR 20 sec x 4;    Supine piriformis stretch Bil 2x30" Neuromuscular Re-education: Manual Therapy: STM/IASTM to R hip, gr troch and glute med/min,  Therapeutic Activity: Self Care:   PATIENT EDUCATION:  PATIENT EDUCATION:  Education details: PT POC, Exam findings, HEP Person educated: Patient Education method: Programmer, multimedia, Demonstration, Tactile cues, Verbal cues, and Handouts Education comprehension: verbalized understanding, returned demonstration, verbal cues required, tactile cues required, and needs further education    HOME EXERCISE PROGRAM: Access Code: [email or web address]   ASSESSMENT:  CLINICAL IMPRESSION:  04/29/2023  Pt continues to have soreness in lateral hip, but decreasing in intensity. She is doing well with ability for strengthening and able to progress standing strength today, without increased pain in hip. She also has increased pain in R lower ribs. Educated on stretching for this today, more comfortable with flexion vs extension today. Will continue to monitor thoracic pain.   Eval: Patient is a 62 y.o.f who was seen today for physical therapy evaluation and treatment for bilateral hip pain (R>L) with signs and symptoms consistent with greater trochanteric pain syndrome. Her primary impairments include hip mobility deficits, hip muscle performance deficits, hip muscle length deficits, and balance deficits.  These deficits limit her ability to walk, walk her dog, navigate stairs, carrying items > 2#, mop, and vacuum without pain/limitation. Pt would benefit from skilled PT to address the above impairments.   OBJECTIVE IMPAIRMENTS: Abnormal gait, decreased balance, decreased coordination, decreased endurance,  difficulty walking, decreased ROM, decreased strength, hypomobility, impaired flexibility, and pain.   ACTIVITY LIMITATIONS: carrying, lifting, bending, sitting, sleeping, stairs, and locomotion level  PARTICIPATION LIMITATIONS: cleaning, laundry, driving, and community activity  PERSONAL FACTORS: Past/current experiences and 3+ comorbidities: R hip OA, osteopenia, osteoporosis, DM, and HTN,  are also affecting patient's functional outcome.   REHAB POTENTIAL: Good  CLINICAL DECISION MAKING: Stable/uncomplicated  EVALUATION COMPLEXITY: Low   GOALS: Goals reviewed with patient? Yes  SHORT TERM GOALS: Target date: 05/01/2023 Pt will be independent in initial HEP  Goal status: INITIAL  2.  Pt will report 50% improvement in her ability to mop/vacuum. Goal status: INITIAL   LONG TERM GOALS: Target date: 06/05/2023  Pt will be independent in final HEP.   Goal status: INITIAL  2.  Pt will demo ability to carry at least 10# with pain less than 3/10 to promote return to ADLs. Goal status: INITIAL  3.  Pt will report 75% improvement in the ability to walk for at least 20 mins.  Goal status: INITIAL  4.  Pt will demo improved lumbar AROM to [facility] and without pain/limitation to improve ability to complete ADLs. Goal status: INITIAL  5.  Pt will improve FOTO score by at least 10 points to promote return to ADLs.  Baseline:  Goal status: INITIAL  PLAN:  PT FREQUENCY: 1-2x/week  PT DURATION: 8 weeks  PLANNED INTERVENTIONS: 97110-Therapeutic exercises, 97530- Therapeutic activity, 97112- Neuromuscular re-education, 97535- Self Care, [id number]- Manual therapy, [phone]- Gait training, [phone]- Aquatic Therapy, Patient/Family education, Balance training, Stair training, Taping, Dry Needling, Joint mobilization, Spinal mobilization, Vestibular training, Cryotherapy, and Moist heat  PLAN FOR NEXT SESSION:   Sedalia Muta, PT, DPT 10:44 AM  04/29/23

## 2023-05-01 ENCOUNTER — Ambulatory Visit: Payer: 59 | Admitting: Physical Therapy

## 2023-05-01 ENCOUNTER — Encounter: Payer: Self-pay | Admitting: Physical Therapy

## 2023-05-01 DIAGNOSIS — M25551 Pain in right hip: Secondary | ICD-10-CM | POA: Diagnosis not present

## 2023-05-01 DIAGNOSIS — M6281 Muscle weakness (generalized): Secondary | ICD-10-CM | POA: Diagnosis not present

## 2023-05-01 DIAGNOSIS — M25651 Stiffness of right hip, not elsewhere classified: Secondary | ICD-10-CM | POA: Diagnosis not present

## 2023-05-01 DIAGNOSIS — M25552 Pain in left hip: Secondary | ICD-10-CM | POA: Diagnosis not present

## 2023-05-01 DIAGNOSIS — H02882 Meibomian gland dysfunction right lower eyelid: Secondary | ICD-10-CM | POA: Diagnosis not present

## 2023-05-01 DIAGNOSIS — M25652 Stiffness of left hip, not elsewhere classified: Secondary | ICD-10-CM | POA: Diagnosis not present

## 2023-05-01 DIAGNOSIS — H16223 Keratoconjunctivitis sicca, not specified as Sjogren's, bilateral: Secondary | ICD-10-CM | POA: Diagnosis not present

## 2023-05-01 DIAGNOSIS — H02885 Meibomian gland dysfunction left lower eyelid: Secondary | ICD-10-CM | POA: Diagnosis not present

## 2023-05-01 NOTE — Therapy (Signed)
OUTPATIENT PHYSICAL THERAPY LOWER EXTREMITY Treatment   Patient Name: Julie Fischer MRN: [id number] DOB:04-28-1961, 62 y.o., female Today's Date: 05/01/2023  END OF SESSION:  PT End of Session - 05/01/23 1005     Visit Number 6    Number of Visits 16    Date for PT Re-Evaluation 06/05/23    Authorization Type [employer]    PT Start Time 1020    PT Stop Time 1058    PT Time Calculation (min) 38 min    Activity Tolerance Patient tolerated treatment well;No increased pain    Behavior During Therapy [facility] for tasks assessed/performed                 Past Medical History:  Diagnosis Date   Allergy    fall seasonal   Anxiety    Blood transfusion    Platelet transfusion when on heparin   Breast cancer (HCC) 2001   R breast(lumpectomy,chemo,radiation,tamoxifen)   C. difficile diarrhea 02/2016   treated with Flagyl   Complication of anesthesia    gets Blanca Friend going under she has been told   COVID-19 09/2020   Depression    treated   Diabetes mellitus    type 2   GERD (gastroesophageal reflux disease)    Hyperlipidemia    Hypertension    resolved after weight loss surgery; recurred 2013   Insomnia chronic   Iron deficiency    h/o   Microalbuminuria    h/o   OA (osteoarthritis) of knee    Osteoporosis    Personal history of chemotherapy    Personal history of radiation therapy    Plantar fasciitis (07') DrRegal   s/p surgical release 01/2011   Sleep apnea    resolved after weight loss surgery; recurrent with weight gain; on CPAP   Vitamin D deficiency    Past Surgical History:  Procedure Laterality Date   APPENDECTOMY     BREAST LUMPECTOMY Right 11/1999   COLONOSCOPY  2002, 2012   cuboid stress fracture Right 12/2002   FOOT SURGERY Left 2008   GASTRIC ROUX-EN-Y  (DrNewman) 3/04   left shoulder fracture repair  (DrMortensen) 12/05   MYRINGOTOMY     tubes B/L, multiple sets   plantar fasciitis release Right 01/30/2011   Dr. Charlsie Merles   TONSILLECTOMY AND  ADENOIDECTOMY     TOTAL KNEE ARTHROPLASTY Left 04/08/2017   Procedure: LEFT TOTAL KNEE ARTHROPLASTY;  Surgeon: Ollen Gross, MD;  Location: [facility];  Service: Orthopedics;  Laterality: Left;   TOTAL KNEE ARTHROPLASTY Right 01/06/2018   Procedure: RIGHT TOTAL KNEE ARTHROPLASTY;  Surgeon: Ollen Gross, MD;  Location: [facility];  Service: Orthopedics;  Laterality: Right;   TOTAL VAGINAL HYSTERECTOMY  2000   and RSO(endometriosis)   Patient Active Problem List   Diagnosis Date Noted   Right hip pain 05/25/2020   Fall 05/25/2020   Frequent urination 04/10/2019   Dysuria 04/10/2019   Vaginal dryness 04/10/2019   Vaginitis, atrophic 04/10/2019   Post-menopausal 04/10/2019   S/P hysterectomy 04/10/2019   Stiffness of right knee 01/10/2018   Family hx of colon cancer 06/05/2017   S/P TKR (total knee replacement), left 06/05/2017   Depressive disorder 05/24/2017   OA (osteoarthritis) of knee 04/08/2017   Eustachian tube dysfunction, right 03/19/2017   Mixed conductive and sensorineural hearing loss of right ear with restricted hearing of left ear 03/19/2017   History of bariatric surgery 07/29/2013   Obesity (BMI 30-39.9) 07/29/2013   Osteoporosis 01/28/2013   Allergic rhinitis 01/28/2013  Anemia, iron deficiency 07/30/2012   Impaired fasting glucose 12/10/2011   Essential hypertension, benign 12/10/2011   Pure hypercholesterolemia 11/15/2010   Insomnia 11/15/2010   Depression, major, in remission (HCC) 11/15/2010   Osteopenia 11/15/2010   History of breast cancer 11/15/2010    PCP: Joselyn Arrow, MD  REFERRING PROVIDER: Rodolph Bong, MD  REFERRING DIAG: Right hip pain  THERAPY DIAG:  Pain in right hip  Stiffness of right hip, not elsewhere classified  Pain in left hip  Muscle weakness (generalized)  Stiffness of left hip, not elsewhere classified  Rationale for Evaluation and Treatment: Rehabilitation  ONSET DATE: Two years   SUBJECTIVE:   SUBJECTIVE STATEMENT:   Pt states the thoracic pain has been decreasing and her hip pain has decreased to a 2/10.    Eval: Pt states she has had hip pain for 2 years. Pt states she does her best to not let her pain stop her from activity but it does limit her ability to complete ADLs at her optimal level. Pt is retired and enjoys walking, staying active, and volunteering in her church. Pt has difficulty with walking, navigating stairs, walking her dog, mopping, vacuuming, laying on it, and carrying >1-2#. Pt denies N/T.  PERTINENT HISTORY: OA R hip, osteopenia, osteoporosis, DM, HTN,   PAIN:  Are you having pain? Yes: NPRS scale eval: 6 constantly /10- 05/01/23: 2/10 pain Pain location: Bil hips R>L Pain description: stabbing R>L  Aggravating factors: Walking, navigating stairs, walking her dog, mopping, vacuuming, sleeping on her R hip, and carrying > 2# Relieving factors: Heat and ice which help for 30 mins max; Aleve which helps for a few hours  PRECAUTIONS: None  RED FLAGS: None   WEIGHT BEARING RESTRICTIONS: No  FALLS:  Has patient fallen in last 6 months? No  LIVING ENVIRONMENT: Lives with: lives with their family and lives with their spouse Lives in: House/apartment Stairs: Yes: Internal: 1 flight steps;    OCCUPATION: Retired   PLOF: Independent  PATIENT GOALS: Reduce pain with walking, carrying items >2#, navigating stairs, mopping, vacuuming, and walking her dog.  NEXT MD VISIT: January 2025  OBJECTIVE:  Note: Objective measures were completed at Evaluation unless otherwise noted.  DIAGNOSTIC FINDINGS: X-ray showed R hip OA;   PATIENT SURVEYS:  FOTO 52  COGNITION: Overall cognitive status: Within functional limits for tasks assessed      MUSCLE LENGTH: Hamstrings: Right WFL deg; Left WFL deg Piriformis test: Right Min restriction ; Left WFL  Quad: Right Min restriction w/p!; Left min restriction   POSTURE: decreased lumbar lordosis and weight shift left  PALPATION: TTP  R iliac crest/glute & R greater troc  LOWER EXTREMITY MMT:  MMT Right eval Left eval  Hip flexion 4+ 4-  Hip extension    Hip abduction 4- 4-  Hip adduction    Hip internal rotation 3+ 3+  Hip external rotation 3+ 3+  Knee flexion 4+ 4+  Knee extension 5 4+  Ankle dorsiflexion    Ankle plantarflexion    Ankle inversion    Ankle eversion     (Blank rows = not tested) LUMBAR ROM:   Active  A/PROM  eval  Flexion WFL  Extension WFL with p! in R hip  Right lateral flexion WFL with p! in R hip  Left lateral flexion WFL  Right rotation WFL  Left rotation WFL   (Blank rows = not tested)   LOWER EXTREMITY SPECIAL TESTS:    FUNCTIONAL TESTS:  5 times  sit to stand: Future session 2 minute walk test: future session  GAIT: Distance walked: 50 ft Assistive device utilized: None Level of assistance: Complete Independence Comments: Min increased frontal plane translation to the L, min reduced trunk rotation   TODAY'S TREATMENT:                                                                                                                              DATE:   05/01/2023 Therapeutic Exercise: Aerobic: Supine: SA reaches 15x5"; Bridges x12 S/L:  hip abd 2 x 12 bil;   Clam shells 2 x 10 bil;  Prone:  Seated: Ball rollouts, center and Left, 15x5"  Standing: Side stepping 10 ft x 6 bil;  hip abd and ext 2 x 10 ea bil; Walking carries, weight in UE, x6 laps, 5# Stretches:  LTR 20 sec x 4; Neuromuscular Re-education:    Manual Therapy:   Therapeutic Activity: Self Care: Modalities: MHP x10 mins to thoracic spine in supine while completing supine and S/L exercises.     Previous Therapeutic Exercise: Aerobic: Supine:  S/L:  hip abd 2 x 10 bil;   clams 2 x 10 bil;  Prone:  Seated: thoracic extension with towel roll x 10/slow ;  seated thoracic/lumbar flexion 20 sec x 3;  Standing: Side stepping 10 ft x 6 bil;  hip abd and ext 2 x 10 ea bil;  Stretches:   LTR 20 sec x  4;   Supine piriformis stretch Bil 2x30"; SKTC bil 2x30" Neuromuscular Re-education:    Manual Therapy:   PA mobs to t-spine; R lower rib mobs; Seated: therapist assisted thoracic extension x 10;  Therapeutic Activity: Self Care:   Therapeutic Exercise: Aerobic: Supine:  SLR x 15 bil;  S/L: hip abd 2 x 10 bil;   Clams 2x10 bil;  Prone: Hip ext x 10 bil;  Seated: Standing: Standing QL stretch R, 3x30" Stretches:  Prone quad stretch bil  2x40";  LTR 20 sec x 4;    Supine piriformis stretch Bil 2x30" Neuromuscular Re-education: Manual Therapy: STM/IASTM to R hip, gr troch and glute med/min  Therapeutic Activity: Self Care:   Therapeutic Exercise: Aerobic: Supine:  SLR x 10 bil;  S/L: hip abd 2 x 10 bil;   clams x 10 bil;  Prone: hip ext x 10 bil;  Seated: Standing: Stretches:  Prone quad stretch bil  2x40";  LTR 20 sec x 4;    Supine piriformis stretch Bil 2x30" Neuromuscular Re-education: Manual Therapy: STM/IASTM to R hip, gr troch and glute med/min,  Therapeutic Activity: Self Care:   PATIENT EDUCATION:  PATIENT EDUCATION:  Education details: PT POC, Exam findings, HEP Person educated: Patient Education method: Programmer, multimedia, Demonstration, Tactile cues, Verbal cues, and Handouts Education comprehension: verbalized understanding, returned demonstration, verbal cues required, tactile cues required, and needs further education    HOME EXERCISE PROGRAM: Access Code: [email or web address]   ASSESSMENT:  CLINICAL IMPRESSION:  05/01/2023  Session focused  on ther ex to promote global strengthening and flexibility. Pt continues to display hip strength improves by increasing the number of reps and fewer compensations during exercises. Pt continues to have some difficulty with hip hiking on carrying items in her L UE and felt her thoracic pain when carrying items in either hand. Continue to promote strengthening interventions in weight bearing positions and monitor thoracic pain. Pt able  to perform all exercises without increased pain or modification. Pt reported no discomfort when using modality.   Eval: Patient is a 62 y.o.f who was seen today for physical therapy evaluation and treatment for bilateral hip pain (R>L) with signs and symptoms consistent with greater trochanteric pain syndrome. Her primary impairments include hip mobility deficits, hip muscle performance deficits, hip muscle length deficits, and balance deficits. These deficits limit her ability to walk, walk her dog, navigate stairs, carrying items > 2#, mop, and vacuum without pain/limitation. Pt would benefit from skilled PT to address the above impairments.   OBJECTIVE IMPAIRMENTS: Abnormal gait, decreased balance, decreased coordination, decreased endurance, difficulty walking, decreased ROM, decreased strength, hypomobility, impaired flexibility, and pain.   ACTIVITY LIMITATIONS: carrying, lifting, bending, sitting, sleeping, stairs, and locomotion level  PARTICIPATION LIMITATIONS: cleaning, laundry, driving, and community activity  PERSONAL FACTORS: Past/current experiences and 3+ comorbidities: R hip OA, osteopenia, osteoporosis, DM, and HTN,  are also affecting patient's functional outcome.   REHAB POTENTIAL: Good  CLINICAL DECISION MAKING: Stable/uncomplicated  EVALUATION COMPLEXITY: Low   GOALS: Goals reviewed with patient? Yes  SHORT TERM GOALS: Target date: 05/01/2023 Pt will be independent in initial HEP  Goal status: INITIAL  2.  Pt will report 50% improvement in her ability to mop/vacuum. Goal status: INITIAL   LONG TERM GOALS: Target date: 06/05/2023  Pt will be independent in final HEP.   Goal status: INITIAL  2.  Pt will demo ability to carry at least 10# with pain less than 3/10 to promote return to ADLs. Goal status: INITIAL  3.  Pt will report 75% improvement in the ability to walk for at least 20 mins.  Goal status: INITIAL  4.  Pt will demo improved lumbar AROM to [facility]  and without pain/limitation to improve ability to complete ADLs. Goal status: INITIAL  5.  Pt will improve FOTO score by at least 10 points to promote return to ADLs.  Baseline:  Goal status: INITIAL  PLAN:  PT FREQUENCY: 1-2x/week  PT DURATION: 8 weeks  PLANNED INTERVENTIONS: 97110-Therapeutic exercises, 97530- Therapeutic activity, 97112- Neuromuscular re-education, 97535- Self Care, 69629- Manual therapy, [phone]- Gait training, [phone]- Aquatic Therapy, Patient/Family education, Balance training, Stair training, Taping, Dry Needling, Joint mobilization, Spinal mobilization, Vestibular training, Cryotherapy, and Moist heat  PLAN FOR NEXT SESSION: Continue to promote weighted carries and LE strengthening  [place], SPT   This entire session was performed under direct supervision and direction of a licensed Estate agent . I have personally read, edited and approve of the note as written.  Sedalia Muta, PT, DPT 10:38 AM  05/01/23

## 2023-05-05 DIAGNOSIS — G4733 Obstructive sleep apnea (adult) (pediatric): Secondary | ICD-10-CM | POA: Diagnosis not present

## 2023-05-06 ENCOUNTER — Ambulatory Visit: Payer: 59 | Admitting: Physical Therapy

## 2023-05-06 ENCOUNTER — Encounter: Payer: Self-pay | Admitting: Physical Therapy

## 2023-05-06 DIAGNOSIS — M25552 Pain in left hip: Secondary | ICD-10-CM | POA: Diagnosis not present

## 2023-05-06 DIAGNOSIS — M25551 Pain in right hip: Secondary | ICD-10-CM | POA: Diagnosis not present

## 2023-05-06 DIAGNOSIS — M25651 Stiffness of right hip, not elsewhere classified: Secondary | ICD-10-CM | POA: Diagnosis not present

## 2023-05-06 NOTE — Therapy (Signed)
OUTPATIENT PHYSICAL THERAPY LOWER EXTREMITY Treatment   Patient Name: Julie Fischer MRN: [id number] DOB:[birth year], 62 y.o., female Today's Date: 05/06/2023  END OF SESSION:  PT End of Session - 05/06/23 1033     Visit Number 7    Number of Visits 16    Date for PT Re-Evaluation 06/05/23    Authorization Type [employer]    PT Start Time 1015    PT Stop Time 1055    PT Time Calculation (min) 40 min    Activity Tolerance Patient tolerated treatment well;No increased pain    Behavior During Therapy [facility] for tasks assessed/performed                  Past Medical History:  Diagnosis Date   Allergy    fall seasonal   Anxiety    Blood transfusion    Platelet transfusion when on heparin   Breast cancer (HCC) 2001   R breast(lumpectomy,chemo,radiation,tamoxifen)   C. difficile diarrhea 02/2016   treated with Flagyl   Complication of anesthesia    gets Blanca Friend going under she has been told   COVID-19 09/2020   Depression    treated   Diabetes mellitus    type 2   GERD (gastroesophageal reflux disease)    Hyperlipidemia    Hypertension    resolved after weight loss surgery; recurred 2013   Insomnia chronic   Iron deficiency    h/o   Microalbuminuria    h/o   OA (osteoarthritis) of knee    Osteoporosis    Personal history of chemotherapy    Personal history of radiation therapy    Plantar fasciitis (07') [facility]Regal   s/p surgical release 01/2011   Sleep apnea    resolved after weight loss surgery; recurrent with weight gain; on CPAP   Vitamin D deficiency    Past Surgical History:  Procedure Laterality Date   APPENDECTOMY     BREAST LUMPECTOMY Right 11/1999   COLONOSCOPY  2002, 2012   cuboid stress fracture Right 12/2002   FOOT SURGERY Left 2008   GASTRIC ROUX-EN-Y  (DrNewman) 3/04   left shoulder fracture repair  (DrMortensen) 12/05   MYRINGOTOMY     tubes B/L, multiple sets   plantar fasciitis release Right 01/30/2011   Dr. Charlsie Merles   TONSILLECTOMY AND  ADENOIDECTOMY     TOTAL KNEE ARTHROPLASTY Left 04/08/2017   Procedure: LEFT TOTAL KNEE ARTHROPLASTY;  Surgeon: Ollen Gross, MD;  Location: [facility];  Service: Orthopedics;  Laterality: Left;   TOTAL KNEE ARTHROPLASTY Right 01/06/2018   Procedure: RIGHT TOTAL KNEE ARTHROPLASTY;  Surgeon: Ollen Gross, MD;  Location: [facility];  Service: Orthopedics;  Laterality: Right;   TOTAL VAGINAL HYSTERECTOMY  2000   and RSO(endometriosis)   Patient Active Problem List   Diagnosis Date Noted   Right hip pain 05/25/2020   Fall 05/25/2020   Frequent urination 04/10/2019   Dysuria 04/10/2019   Vaginal dryness 04/10/2019   Vaginitis, atrophic 04/10/2019   Post-menopausal 04/10/2019   S/P hysterectomy 04/10/2019   Stiffness of right knee 01/10/2018   Family hx of colon cancer 06/05/2017   S/P TKR (total knee replacement), left 06/05/2017   Depressive disorder 05/24/2017   OA (osteoarthritis) of knee 04/08/2017   Eustachian tube dysfunction, right 03/19/2017   Mixed conductive and sensorineural hearing loss of right ear with restricted hearing of left ear 03/19/2017   History of bariatric surgery 07/29/2013   Obesity (BMI 30-39.9) 07/29/2013   Osteoporosis 01/28/2013   Allergic rhinitis  01/28/2013   Anemia, iron deficiency 07/30/2012   Impaired fasting glucose 12/10/2011   Essential hypertension, benign 12/10/2011   Pure hypercholesterolemia 11/15/2010   Insomnia 11/15/2010   Depression, major, in remission (HCC) 11/15/2010   Osteopenia 11/15/2010   History of breast cancer 11/15/2010    PCP: Joselyn Arrow, MD  REFERRING PROVIDER: Rodolph Bong, MD  REFERRING DIAG: Right hip pain  THERAPY DIAG:  Pain in right hip  Stiffness of right hip, not elsewhere classified  Pain in left hip  Rationale for Evaluation and Treatment: Rehabilitation  ONSET DATE: Two years   SUBJECTIVE:   SUBJECTIVE STATEMENT:  Pt states hip getting better, still feeling bruised, but lower pain level, and mostly  only feels sore to touch.  Back also feeling better (1/10). She did quite a bit of standing in kitchen this weekend.    Eval: Pt states she has had hip pain for 2 years. Pt states she does her best to not let her pain stop her from activity but it does limit her ability to complete ADLs at her optimal level. Pt is retired and enjoys walking, staying active, and volunteering in her church. Pt has difficulty with walking, navigating stairs, walking her dog, mopping, vacuuming, laying on it, and carrying >1-2#. Pt denies N/T.  PERTINENT HISTORY: OA R hip, osteopenia, osteoporosis, DM, HTN,   PAIN:  Are you having pain? Yes: NPRS scale eval: up to 5/10 with walking over the weekend.  Pain location: Bil hips R>L Pain description: dull /bruise Aggravating factors: Walking, navigating stairs, walking her dog, mopping, vacuuming, sleeping on her R hip, and carrying > 2# Relieving factors: Heat and ice which help for 30 mins max; Aleve which helps for a few hours  PRECAUTIONS: None  RED FLAGS: None   WEIGHT BEARING RESTRICTIONS: No  FALLS:  Has patient fallen in last 6 months? No  LIVING ENVIRONMENT: Lives with: lives with their family and lives with their spouse Lives in: House/apartment Stairs: Yes: Internal: 1 flight steps;    OCCUPATION: Retired   PLOF: Independent  PATIENT GOALS: Reduce pain with walking, carrying items >2#, navigating stairs, mopping, vacuuming, and walking her dog.  NEXT MD VISIT: January 2025  OBJECTIVE:  Note: Objective measures were completed at Evaluation unless otherwise noted.  DIAGNOSTIC FINDINGS: X-ray showed R hip OA;   PATIENT SURVEYS:  FOTO 52  COGNITION: Overall cognitive status: Within functional limits for tasks assessed      MUSCLE LENGTH: Hamstrings: Right WFL deg; Left WFL deg Piriformis test: Right Min restriction ; Left WFL  Quad: Right Min restriction w/p!; Left min restriction   POSTURE: decreased lumbar lordosis and weight  shift left  PALPATION: TTP R iliac crest/glute & R greater troc  LOWER EXTREMITY MMT:  MMT Right eval Left eval  Hip flexion 4+ 4-  Hip extension    Hip abduction 4- 4-  Hip adduction    Hip internal rotation 3+ 3+  Hip external rotation 3+ 3+  Knee flexion 4+ 4+  Knee extension 5 4+  Ankle dorsiflexion    Ankle plantarflexion    Ankle inversion    Ankle eversion     (Blank rows = not tested) LUMBAR ROM:   Active  A/PROM  eval  Flexion WFL  Extension WFL with p! in R hip  Right lateral flexion WFL with p! in R hip  Left lateral flexion WFL  Right rotation WFL  Left rotation WFL   (Blank rows = not tested)  LOWER EXTREMITY SPECIAL TESTS:    FUNCTIONAL TESTS:  5 times sit to stand: Future session 2 minute walk test: future session  GAIT: Distance walked: 50 ft Assistive device utilized: None Level of assistance: Complete Independence Comments: Min increased frontal plane translation to the L, min reduced trunk rotation   TODAY'S TREATMENT:                                                                                                                              DATE:   05/06/2023 Therapeutic Exercise: Aerobic: Supine:  S/L:  hip abd 2 x 12 bil;    Prone:  Seated: Ball rollouts, center and Left, 15x5"  Standing: Side stepping 10 ft x 6 bil;   hip abd 2 x 10 ea bil;  Walking carries, weight in UE, x4 laps, 5#, then 7# ;  lateral step ups 4 in x 10 bil no hands;   marching/slow 2 x 15 Tandem stance 30 sec x 2 bil;  Stretches:  Neuromuscular Re-education:    Manual Therapy:  STM/DTM with tennis ball  to L glute in s/l.    Previous Therapeutic Exercise: Aerobic: Supine:  S/L:  hip abd 2 x 10 bil;   clams 2 x 10 bil;  Prone:  Seated: thoracic extension with towel roll x 10/slow ;  seated thoracic/lumbar flexion 20 sec x 3;  Standing: Side stepping 10 ft x 6 bil;  hip abd and ext 2 x 10 ea bil;  Stretches:   LTR 20 sec x 4;   Supine piriformis  stretch Bil 2x30"; SKTC bil 2x30" Neuromuscular Re-education:    Manual Therapy:   PA mobs to t-spine; R lower rib mobs; Seated: therapist assisted thoracic extension x 10;  Therapeutic Activity: Self Care:   Therapeutic Exercise: Aerobic: Supine:  SLR x 15 bil;  S/L: hip abd 2 x 10 bil;   Clams 2x10 bil;  Prone: Hip ext x 10 bil;  Seated: Standing: Standing QL stretch R, 3x30" Stretches:  Prone quad stretch bil  2x40";  LTR 20 sec x 4;    Supine piriformis stretch Bil 2x30" Neuromuscular Re-education: Manual Therapy: STM/IASTM to R hip, gr troch and glute med/min  Therapeutic Activity: Self Care:   PATIENT EDUCATION:  PATIENT EDUCATION:  Education details: PT POC, Exam findings, HEP Person educated: Patient Education method: Programmer, multimedia, Demonstration, Tactile cues, Verbal cues, and Handouts Education comprehension: verbalized understanding, returned demonstration, verbal cues required, tactile cues required, and needs further education    HOME EXERCISE PROGRAM: Access Code: [email or web address]   ASSESSMENT:  CLINICAL IMPRESSION:  05/06/2023  Pt with improving pain overall. She continues to have mild pain with palpation and manual, but pain improving with activity. She has less pain with carrying weight or object in clinic and at home and improving strength and stability in hip seen with ther ex. Her thoracic pain is also better today. Updated HEP, and discussed continuing PT for a couple more weeks,  to further strengthen and continue decreasing pain in hip.    Eval: Patient is a 62 y.o.f who was seen today for physical therapy evaluation and treatment for bilateral hip pain (R>L) with signs and symptoms consistent with greater trochanteric pain syndrome. Her primary impairments include hip mobility deficits, hip muscle performance deficits, hip muscle length deficits, and balance deficits. These deficits limit her ability to walk, walk her dog, navigate stairs, carrying items > 2#,  mop, and vacuum without pain/limitation. Pt would benefit from skilled PT to address the above impairments.   OBJECTIVE IMPAIRMENTS: Abnormal gait, decreased balance, decreased coordination, decreased endurance, difficulty walking, decreased ROM, decreased strength, hypomobility, impaired flexibility, and pain.   ACTIVITY LIMITATIONS: carrying, lifting, bending, sitting, sleeping, stairs, and locomotion level  PARTICIPATION LIMITATIONS: cleaning, laundry, driving, and community activity  PERSONAL FACTORS: Past/current experiences and 3+ comorbidities: R hip OA, osteopenia, osteoporosis, DM, and HTN,  are also affecting patient's functional outcome.   REHAB POTENTIAL: Good  CLINICAL DECISION MAKING: Stable/uncomplicated  EVALUATION COMPLEXITY: Low   GOALS: Goals reviewed with patient? Yes  SHORT TERM GOALS: Target date: 05/01/2023 Pt will be independent in initial HEP  Goal status:MET  2.  Pt will report 50% improvement in her ability to mop/vacuum. Goal status: MET   LONG TERM GOALS: Target date: 06/05/2023  Pt will be independent in final HEP.   Goal status: INITIAL  2.  Pt will demo ability to carry at least 10# with pain less than 3/10 to promote return to ADLs. Goal status: INITIAL  3.  Pt will report 75% improvement in the ability to walk for at least 20 mins.  Goal status: INITIAL  4.  Pt will demo improved lumbar AROM to [facility] and without pain/limitation to improve ability to complete ADLs. Goal status: INITIAL  5.  Pt will improve FOTO score by at least 10 points to promote return to ADLs.  Baseline:  Goal status: INITIAL  PLAN:  PT FREQUENCY: 1-2x/week  PT DURATION: 8 weeks  PLANNED INTERVENTIONS: 97110-Therapeutic exercises, 97530- Therapeutic activity, 97112- Neuromuscular re-education, 97535- Self Care, [id number]- Manual therapy, [phone]- Gait training, [phone]- Aquatic Therapy, Patient/Family education, Balance training, Stair training, Taping, Dry Needling,  Joint mobilization, Spinal mobilization, Vestibular training, Cryotherapy, and Moist heat  PLAN FOR NEXT SESSION: Continue to promote weighted carries and LE strengthening F/u foto    Sedalia Muta, PT, DPT 11:58 AM  05/06/23

## 2023-05-07 ENCOUNTER — Encounter: Payer: Self-pay | Admitting: Family Medicine

## 2023-05-07 ENCOUNTER — Ambulatory Visit: Payer: 59 | Admitting: Family Medicine

## 2023-05-07 VITALS — BP 102/70 | HR 81 | Ht <= 58 in | Wt 141.0 lb

## 2023-05-07 DIAGNOSIS — M8589 Other specified disorders of bone density and structure, multiple sites: Secondary | ICD-10-CM | POA: Diagnosis not present

## 2023-05-07 DIAGNOSIS — M25551 Pain in right hip: Secondary | ICD-10-CM

## 2023-05-07 NOTE — Patient Instructions (Signed)
Thank you for coming in today. Keep up the exercises.  Return as needed.

## 2023-05-07 NOTE — Progress Notes (Signed)
   I, Stevenson Clinch, CMA acting as a scribe for Clementeen Graham, MD.  Julie Fischer is a 62 y.o. female who presents to [facility] today for f/u R hip pain. Pt was last seen by Dr. Denyse Amass on 03/26/23 and was given a R GT steroid injection and was referred to PT, completing 7 visits.  Today, pt reports some improvement of right hip sx. Sx exacerbated by prolonged standing or walking. PT has been helpful, compliant with HEP. Taking Tylenol prn. Denies new or worsening sx.   Dx imaging: 03/26/23 R hip XR  Pertinent review of systems: No fevers or chills  Relevant historical information: Hypertension and osteopenia.   Exam:  BP 102/70   Pulse 81   Ht 4' 8.5" (1.435 m)   Wt 141 lb (64 kg)   SpO2 96%   BMI 31.05 kg/m  General: Well Developed, well nourished, and in no acute distress.   MSK: Right hip normal motion.  Normal gait.    Lab and Radiology Results  EXAM: DG HIP (WITH OR WITHOUT PELVIS) 2-3V RIGHT   COMPARISON:  Right hip x-ray 05/25/2020   FINDINGS: There is no evidence of hip fracture or dislocation. Joint spaces are maintained. There are minimal degenerative osteophytes seen along the superolateral aspect of the right hip. Soft tissues are within normal limits.   IMPRESSION: Minimal degenerative changes of the right hip.     Electronically Signed   By: Darliss Cheney M.D.   On: 04/15/2023 00:31   I, Clementeen Graham, personally (independently) visualized and performed the interpretation of the images attached in this note.    Assessment and Plan: 62 y.o. 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  Improving with physical therapy and home exercise program.  Plan to continue home exercise program and recheck back as needed.  We talked about bone density.  DEXA scan should be repeated August 2025.  PDMP not reviewed this encounter. No orders of the defined types were placed in this encounter.  No  orders of the defined types were placed in this encounter.    Discussed warning signs or symptoms. Please see discharge instructions. Patient expresses understanding.   The above documentation has been reviewed and is accurate and complete Clementeen Graham, M.D.

## 2023-05-08 ENCOUNTER — Encounter: Payer: Self-pay | Admitting: Physical Therapy

## 2023-05-08 ENCOUNTER — Ambulatory Visit: Payer: 59 | Admitting: Physical Therapy

## 2023-05-08 DIAGNOSIS — M25552 Pain in left hip: Secondary | ICD-10-CM

## 2023-05-08 DIAGNOSIS — M25551 Pain in right hip: Secondary | ICD-10-CM

## 2023-05-08 DIAGNOSIS — M25651 Stiffness of right hip, not elsewhere classified: Secondary | ICD-10-CM

## 2023-05-08 NOTE — Therapy (Signed)
OUTPATIENT PHYSICAL THERAPY LOWER EXTREMITY Treatment   Patient Name: Julie Fischer MRN: [id number] DOB:11-Jan-1961, 62 y.o., female Today's Date: 05/08/2023  END OF SESSION:  PT End of Session - 05/08/23 1045     Visit Number 8    Number of Visits 16    Date for PT Re-Evaluation 06/05/23    Authorization Type [employer]    PT Start Time 1022    PT Stop Time 1100    PT Time Calculation (min) 38 min    Activity Tolerance Patient tolerated treatment well;No increased pain    Behavior During Therapy [facility] for tasks assessed/performed                   Past Medical History:  Diagnosis Date   Allergy    fall seasonal   Anxiety    Blood transfusion    Platelet transfusion when on heparin   Breast cancer (HCC) 2001   R breast(lumpectomy,chemo,radiation,tamoxifen)   C. difficile diarrhea 02/2016   treated with Flagyl   Complication of anesthesia    gets Blanca Friend going under she has been told   COVID-19 09/2020   Depression    treated   Diabetes mellitus    type 2   GERD (gastroesophageal reflux disease)    Hyperlipidemia    Hypertension    resolved after weight loss surgery; recurred 2013   Insomnia chronic   Iron deficiency    h/o   Microalbuminuria    h/o   OA (osteoarthritis) of knee    Osteoporosis    Personal history of chemotherapy    Personal history of radiation therapy    Plantar fasciitis (07') DrRegal   s/p surgical release 01/2011   Sleep apnea    resolved after weight loss surgery; recurrent with weight gain; on CPAP   Vitamin D deficiency    Past Surgical History:  Procedure Laterality Date   APPENDECTOMY     BREAST LUMPECTOMY Right 11/1999   COLONOSCOPY  2002, 2012   cuboid stress fracture Right 12/2002   FOOT SURGERY Left 2008   GASTRIC ROUX-EN-Y  (DrNewman) 3/04   left shoulder fracture repair  (DrMortensen) 12/05   MYRINGOTOMY     tubes B/L, multiple sets   plantar fasciitis release Right 01/30/2011   Dr. Charlsie Merles   TONSILLECTOMY AND  ADENOIDECTOMY     TOTAL KNEE ARTHROPLASTY Left 04/08/2017   Procedure: LEFT TOTAL KNEE ARTHROPLASTY;  Surgeon: Ollen Gross, MD;  Location: [facility];  Service: Orthopedics;  Laterality: Left;   TOTAL KNEE ARTHROPLASTY Right 01/06/2018   Procedure: RIGHT TOTAL KNEE ARTHROPLASTY;  Surgeon: Ollen Gross, MD;  Location: [facility];  Service: Orthopedics;  Laterality: Right;   TOTAL VAGINAL HYSTERECTOMY  2000   and RSO(endometriosis)   Patient Active Problem List   Diagnosis Date Noted   Right hip pain 05/25/2020   Fall 05/25/2020   Frequent urination 04/10/2019   Dysuria 04/10/2019   Vaginal dryness 04/10/2019   Vaginitis, atrophic 04/10/2019   Post-menopausal 04/10/2019   S/P hysterectomy 04/10/2019   Stiffness of right knee 01/10/2018   Family hx of colon cancer 06/05/2017   S/P TKR (total knee replacement), left 06/05/2017   Depressive disorder 05/24/2017   OA (osteoarthritis) of knee 04/08/2017   Eustachian tube dysfunction, right 03/19/2017   Mixed conductive and sensorineural hearing loss of right ear with restricted hearing of left ear 03/19/2017   History of bariatric surgery 07/29/2013   Obesity (BMI 30-39.9) 07/29/2013   Osteoporosis 01/28/2013   Allergic  rhinitis 01/28/2013   Anemia, iron deficiency 07/30/2012   Impaired fasting glucose 12/10/2011   Essential hypertension, benign 12/10/2011   Pure hypercholesterolemia 11/15/2010   Insomnia 11/15/2010   Depression, major, in remission (HCC) 11/15/2010   Osteopenia 11/15/2010   History of breast cancer 11/15/2010    PCP: Joselyn Arrow, MD  REFERRING PROVIDER: Rodolph Bong, MD  REFERRING DIAG: Right hip pain  THERAPY DIAG:  Pain in right hip  Stiffness of right hip, not elsewhere classified  Pain in left hip  Rationale for Evaluation and Treatment: Rehabilitation  ONSET DATE: Two years   SUBJECTIVE:   SUBJECTIVE STATEMENT:  Pt states hip getting better, still feeling sore with activity. Back feeling  better/good today.   Eval: Pt states she has had hip pain for 2 years. Pt states she does her best to not let her pain stop her from activity but it does limit her ability to complete ADLs at her optimal level. Pt is retired and enjoys walking, staying active, and volunteering in her church. Pt has difficulty with walking, navigating stairs, walking her dog, mopping, vacuuming, laying on it, and carrying >1-2#. Pt denies N/T.  PERTINENT HISTORY: OA R hip, osteopenia, osteoporosis, DM, HTN,   PAIN:  Are you having pain? Yes: NPRS scale eval: up to 5/10 with walking over the weekend.  Pain location: Bil hips R>L Pain description: dull /bruise Aggravating factors: Walking, navigating stairs, walking her dog, mopping, vacuuming, sleeping on her R hip, and carrying > 2# Relieving factors: Heat and ice which help for 30 mins max; Aleve which helps for a few hours  PRECAUTIONS: None  RED FLAGS: None   WEIGHT BEARING RESTRICTIONS: No  FALLS:  Has patient fallen in last 6 months? No  LIVING ENVIRONMENT: Lives with: lives with their family and lives with their spouse Lives in: House/apartment Stairs: Yes: Internal: 1 flight steps;    OCCUPATION: Retired   PLOF: Independent  PATIENT GOALS: Reduce pain with walking, carrying items >2#, navigating stairs, mopping, vacuuming, and walking her dog.  NEXT MD VISIT: January 2025  OBJECTIVE:  Note: Objective measures were completed at Evaluation unless otherwise noted.  DIAGNOSTIC FINDINGS: X-ray showed R hip OA;   PATIENT SURVEYS:  FOTO 52 Visit: 8:    62  COGNITION: Overall cognitive status: Within functional limits for tasks assessed      MUSCLE LENGTH: Hamstrings: Right WFL deg; Left WFL deg Piriformis test: Right Min restriction ; Left WFL  Quad: Right Min restriction w/p!; Left min restriction   POSTURE: decreased lumbar lordosis and weight shift left  PALPATION: TTP R iliac crest/glute & R greater troc  LOWER  EXTREMITY MMT:  MMT Right eval Left eval  Hip flexion 4+ 4-  Hip extension    Hip abduction 4- 4-  Hip adduction    Hip internal rotation 3+ 3+  Hip external rotation 3+ 3+  Knee flexion 4+ 4+  Knee extension 5 4+  Ankle dorsiflexion    Ankle plantarflexion    Ankle inversion    Ankle eversion     (Blank rows = not tested) LUMBAR ROM:   Active  A/PROM  eval  Flexion WFL  Extension WFL with p! in R hip  Right lateral flexion WFL with p! in R hip  Left lateral flexion WFL  Right rotation WFL  Left rotation WFL   (Blank rows = not tested)   LOWER EXTREMITY SPECIAL TESTS:    FUNCTIONAL TESTS:  5 times sit to stand: Future  session 2 minute walk test: future session  GAIT: Distance walked: 50 ft Assistive device utilized: None Level of assistance: Complete Independence Comments: Min increased frontal plane translation to the L, min reduced trunk rotation   TODAY'S TREATMENT:                                                                                                                              DATE:   05/08/2023 Therapeutic Exercise: Aerobic: bike L1 x 6 min Supine:  S/L:   Prone:  Seated:  Standing: Side stepping 20 ft x 6 bil;   hip abd 2 x 10 ea bil;  fwd (6in) and   lateral (4in )step ups  x 10 ea bil no hands;   marching/slow 2 x 15 Tandem stance 30 sec x 2 bil;  Stretches:  Neuromuscular Re-education:    Manual Therapy:  STM/DTM with tennis ball  and roller to L glute in s/l.    Previous Therapeutic Exercise: Aerobic: Supine:  S/L:  hip abd 2 x 10 bil;   clams 2 x 10 bil;  Prone:  Seated: thoracic extension with towel roll x 10/slow ;  seated thoracic/lumbar flexion 20 sec x 3;  Standing: Side stepping 10 ft x 6 bil;  hip abd and ext 2 x 10 ea bil;  Stretches:   LTR 20 sec x 4;   Supine piriformis stretch Bil 2x30"; SKTC bil 2x30" Neuromuscular Re-education:    Manual Therapy:   PA mobs to t-spine; R lower rib mobs; Seated: therapist  assisted thoracic extension x 10;  Therapeutic Activity: Self Care:   Therapeutic Exercise: Aerobic: Supine:  SLR x 15 bil;  S/L: hip abd 2 x 10 bil;   Clams 2x10 bil;  Prone: Hip ext x 10 bil;  Seated: Standing: Standing QL stretch R, 3x30" Stretches:  Prone quad stretch bil  2x40";  LTR 20 sec x 4;    Supine piriformis stretch Bil 2x30" Neuromuscular Re-education: Manual Therapy: STM/IASTM to R hip, gr troch and glute med/min  Therapeutic Activity: Self Care:   PATIENT EDUCATION:  PATIENT EDUCATION:  Education details: PT POC, Exam findings, HEP Person educated: Patient Education method: Programmer, multimedia, Demonstration, Tactile cues, Verbal cues, and Handouts Education comprehension: verbalized understanding, returned demonstration, verbal cues required, tactile cues required, and needs further education    HOME EXERCISE PROGRAM: Access Code: [email or web address]   ASSESSMENT:  CLINICAL IMPRESSION:  05/08/2023  Pt with improving pain . She continues to have mild pain with palpation and manual, but pain improving with activity. She has mild weakness seen with ther ex, with Single leg activity, and mild hip drop on R compared to L. She gets mild soreness with increased standing ther ex today. Will benefit from continued strength and stabilization for hip.   Eval: Patient is a 63 y.o.f who was seen today for physical therapy evaluation and treatment for bilateral hip pain (R>L) with signs and symptoms consistent with greater trochanteric pain  syndrome. Her primary impairments include hip mobility deficits, hip muscle performance deficits, hip muscle length deficits, and balance deficits. These deficits limit her ability to walk, walk her dog, navigate stairs, carrying items > 2#, mop, and vacuum without pain/limitation. Pt would benefit from skilled PT to address the above impairments.   OBJECTIVE IMPAIRMENTS: Abnormal gait, decreased balance, decreased coordination, decreased endurance,  difficulty walking, decreased ROM, decreased strength, hypomobility, impaired flexibility, and pain.   ACTIVITY LIMITATIONS: carrying, lifting, bending, sitting, sleeping, stairs, and locomotion level  PARTICIPATION LIMITATIONS: cleaning, laundry, driving, and community activity  PERSONAL FACTORS: Past/current experiences and 3+ comorbidities: R hip OA, osteopenia, osteoporosis, DM, and HTN,  are also affecting patient's functional outcome.   REHAB POTENTIAL: Good  CLINICAL DECISION MAKING: Stable/uncomplicated  EVALUATION COMPLEXITY: Low   GOALS: Goals reviewed with patient? Yes  SHORT TERM GOALS: Target date: 05/01/2023 Pt will be independent in initial HEP  Goal status:MET  2.  Pt will report 50% improvement in her ability to mop/vacuum. Goal status: MET   LONG TERM GOALS: Target date: 06/05/2023  Pt will be independent in final HEP.   Goal status: INITIAL  2.  Pt will demo ability to carry at least 10# with pain less than 3/10 to promote return to ADLs. Goal status: INITIAL  3.  Pt will report 75% improvement in the ability to walk for at least 20 mins.  Goal status: INITIAL  4.  Pt will demo improved lumbar AROM to [facility] and without pain/limitation to improve ability to complete ADLs. Goal status: INITIAL  5.  Pt will improve FOTO score by at least 10 points to promote return to ADLs.  Baseline:  Goal status: INITIAL  PLAN:  PT FREQUENCY: 1-2x/week  PT DURATION: 8 weeks  PLANNED INTERVENTIONS: 97110-Therapeutic exercises, 97530- Therapeutic activity, 97112- Neuromuscular re-education, 97535- Self Care, 16109- Manual therapy, [phone]- Gait training, [phone]- Aquatic Therapy, Patient/Family education, Balance training, Stair training, Taping, Dry Needling, Joint mobilization, Spinal mobilization, Vestibular training, Cryotherapy, and Moist heat  PLAN FOR NEXT SESSION: Continue to promote weighted carries and LE strengthening    Sedalia Muta, PT, DPT 2:05 PM   05/08/23

## 2023-05-19 ENCOUNTER — Other Ambulatory Visit: Payer: Self-pay | Admitting: Family Medicine

## 2023-05-19 DIAGNOSIS — E1169 Type 2 diabetes mellitus with other specified complication: Secondary | ICD-10-CM

## 2023-05-27 ENCOUNTER — Ambulatory Visit: Payer: 59 | Admitting: Physical Therapy

## 2023-05-27 ENCOUNTER — Encounter: Payer: Self-pay | Admitting: Physical Therapy

## 2023-05-27 DIAGNOSIS — M25651 Stiffness of right hip, not elsewhere classified: Secondary | ICD-10-CM

## 2023-05-27 DIAGNOSIS — M25551 Pain in right hip: Secondary | ICD-10-CM | POA: Diagnosis not present

## 2023-05-27 NOTE — Therapy (Signed)
 OUTPATIENT PHYSICAL THERAPY LOWER EXTREMITY Treatment   Patient Name: PAISLYNN HEGSTROM MRN: [id number] DOB:January 27, 1961, 63 y.o., female Today's Date: 05/27/2023  END OF SESSION:  PT End of Session - 05/27/23 1016     Visit Number 9    Number of Visits 16    Date for PT Re-Evaluation 06/05/23    Authorization Type [employer]et[employer]    PT Start Time 1016    PT Stop Time 1100    PT Time Calculation (min) 44 min    Activity Tolerance Patient tolerated treatment well;No increased pain    Behavior During Therapy [facility] for tasks assessed/performed                   Past Medical History:  Diagnosis Date   Allergy    fall seasonal   Anxiety    Blood transfusion    Platelet transfusion when on heparin   Breast cancer (HCC) 2001   R breast(lumpectomy,chemo,radiation,tamoxifen)   C. difficile diarrhea 02/2016   treated with Flagyl    Complication of anesthesia    gets Lavone going under she has been told   COVID-19 09/2020   Depression    treated   Diabetes mellitus    type 2   GERD (gastroesophageal reflux disease)    Hyperlipidemia    Hypertension    resolved after weight loss surgery; recurred 2013   Insomnia chronic   Iron deficiency    h/o   Microalbuminuria    h/o   OA (osteoarthritis) of knee    Osteoporosis    Personal history of chemotherapy    Personal history of radiation therapy    Plantar fasciitis (07') DrRegal   s/p surgical release 01/2011   Sleep apnea    resolved after weight loss surgery; recurrent with weight gain; on CPAP   Vitamin D  deficiency    Past Surgical History:  Procedure Laterality Date   APPENDECTOMY     BREAST LUMPECTOMY Right 11/1999   COLONOSCOPY  2002, 2012   cuboid stress fracture Right 12/2002   FOOT SURGERY Left 2008   GASTRIC ROUX-EN-Y  (DrNewman) 3/04   left shoulder fracture repair  (DrMortensen) 12/05   MYRINGOTOMY     tubes B/L, multiple sets   plantar fasciitis release Right 01/30/2011   Dr. Magdalen   TONSILLECTOMY AND  ADENOIDECTOMY     TOTAL KNEE ARTHROPLASTY Left 04/08/2017   Procedure: LEFT TOTAL KNEE ARTHROPLASTY;  Surgeon: Melodi Lerner, MD;  Location: [facility];  Service: Orthopedics;  Laterality: Left;   TOTAL KNEE ARTHROPLASTY Right 01/06/2018   Procedure: RIGHT TOTAL KNEE ARTHROPLASTY;  Surgeon: Melodi Lerner, MD;  Location: [facility];  Service: Orthopedics;  Laterality: Right;   TOTAL VAGINAL HYSTERECTOMY  2000   and RSO(endometriosis)   Patient Active Problem List   Diagnosis Date Noted   Right hip pain 05/25/2020   Fall 05/25/2020   Frequent urination 04/10/2019   Dysuria 04/10/2019   Vaginal dryness 04/10/2019   Vaginitis, atrophic 04/10/2019   Post-menopausal 04/10/2019   S/P hysterectomy 04/10/2019   Stiffness of right knee 01/10/2018   Family hx of colon cancer 06/05/2017   S/P TKR (total knee replacement), left 06/05/2017   Depressive disorder 05/24/2017   OA (osteoarthritis) of knee 04/08/2017   Eustachian tube dysfunction, right 03/19/2017   Mixed conductive and sensorineural hearing loss of right ear with restricted hearing of left ear 03/19/2017   History of bariatric surgery 07/29/2013   Obesity (BMI 30-39.9) 07/29/2013   Osteoporosis 01/28/2013   Allergic  rhinitis 01/28/2013   Anemia, iron deficiency 07/30/2012   Impaired fasting glucose 12/10/2011   Essential hypertension, benign 12/10/2011   Pure hypercholesterolemia 11/15/2010   Insomnia 11/15/2010   Depression, major, in remission (HCC) 11/15/2010   Osteopenia 11/15/2010   History of breast cancer 11/15/2010    PCP: Randol Dawes, MD  REFERRING PROVIDER: Joane Artist RAMAN, MD  REFERRING DIAG: Right hip pain  THERAPY DIAG:  Pain in right hip  Stiffness of right hip, not elsewhere classified  Rationale for Evaluation and Treatment: Rehabilitation  ONSET DATE: Two years   SUBJECTIVE:   SUBJECTIVE STATEMENT:  Pt states back is doing fine. R hip still sore in the evening, not really painful, but feeling soreness  daily, with increased activity. Also sore with laying on it.   Eval: Pt states she has had hip pain for 2 years. Pt states she does her best to not let her pain stop her from activity but it does limit her ability to complete ADLs at her optimal level. Pt is retired and enjoys walking, staying active, and volunteering in her church. Pt has difficulty with walking, navigating stairs, walking her dog, mopping, vacuuming, laying on it, and carrying >1-2#. Pt denies N/T.  PERTINENT HISTORY: OA R hip, osteopenia, osteoporosis, DM, HTN,   PAIN:  Are you having pain? Yes: NPRS scale eval: up to 0-5/10 with activity   Pain location: Bil hips R>L Pain description: dull /bruise Aggravating factors: Walking, navigating stairs, walking her dog, mopping, vacuuming, sleeping on her R hip, and carrying > 2# Relieving factors: Heat and ice which help for 30 mins max; Aleve  which helps for a few hours  PRECAUTIONS: None  RED FLAGS: None   WEIGHT BEARING RESTRICTIONS: No  FALLS:  Has patient fallen in last 6 months? No  LIVING ENVIRONMENT: Lives with: lives with their family and lives with their spouse Lives in: House/apartment Stairs: Yes: Internal: 1 flight steps;    OCCUPATION: Retired   PLOF: Independent  PATIENT GOALS: Reduce pain with walking, carrying items >2#, navigating stairs, mopping, vacuuming, and walking her dog.  NEXT MD VISIT: January 2025  OBJECTIVE:  Note: Objective measures were completed at Evaluation unless otherwise noted.  DIAGNOSTIC FINDINGS: X-ray showed R hip OA;   PATIENT SURVEYS:  FOTO 52 Visit: 8:    3  COGNITION: Overall cognitive status: Within functional limits for tasks assessed      MUSCLE LENGTH: Hamstrings: Right WFL deg; Left WFL deg Piriformis test: Right Min restriction ; Left WFL  Quad: Right Min restriction w/p!; Left min restriction   POSTURE: decreased lumbar lordosis and weight shift left  PALPATION: TTP R iliac crest/glute & R  greater troc  LOWER EXTREMITY MMT:  MMT Right eval Left eval  Hip flexion 4+ 4-  Hip extension    Hip abduction 4- 4-  Hip adduction    Hip internal rotation 3+ 3+  Hip external rotation 3+ 3+  Knee flexion 4+ 4+  Knee extension 5 4+  Ankle dorsiflexion    Ankle plantarflexion    Ankle inversion    Ankle eversion     (Blank rows = not tested) LUMBAR ROM:   Active  A/PROM  eval  Flexion WFL  Extension WFL with p! in R hip  Right lateral flexion WFL with p! in R hip  Left lateral flexion WFL  Right rotation WFL  Left rotation WFL   (Blank rows = not tested)   LOWER EXTREMITY SPECIAL TESTS:    FUNCTIONAL  TESTS:  5 times sit to stand: Future session 2 minute walk test: future session  GAIT: Distance walked: 50 ft Assistive device utilized: None Level of assistance: Complete Independence Comments: Min increased frontal plane translation to the L, min reduced trunk rotation   TODAY'S TREATMENT:                                                                                                                              DATE:   05/27/2023 Therapeutic Exercise: Aerobic:  bike L1 x 6 min Supine:  S/L:   hip abd 2 x 10 bil;   Prone:  Seated:  Standing:    hip abd and ext  2 x 10 ea bil;   fwd (6in) and   lateral (4in )step ups  x 10 ea bil no hands;  marching/slow 2 x 15 Tandem stance 30 sec x 2 bil; with head turns 2 x 10 bil;  Stretches:  seated piriformis 20 sec x 3 bil;   Neuromuscular Re-education:    Manual Therapy:     Previous Therapeutic Exercise: Aerobic: Supine:  S/L:  hip abd 2 x 10 bil;   clams 2 x 10 bil;  Prone:  Seated: thoracic extension with towel roll x 10/slow ;  seated thoracic/lumbar flexion 20 sec x 3;  Standing: Side stepping 10 ft x 6 bil;  hip abd and ext 2 x 10 ea bil;  Stretches:   LTR 20 sec x 4;   Supine piriformis stretch Bil 2x30; SKTC bil 2x30 Neuromuscular Re-education:    Manual Therapy:   PA mobs to t-spine; R lower  rib mobs; Seated: therapist assisted thoracic extension x 10;  Therapeutic Activity: Self Care:   Therapeutic Exercise: Aerobic: Supine:  SLR x 15 bil;  S/L: hip abd 2 x 10 bil;   Clams 2x10 bil;  Prone: Hip ext x 10 bil;  Seated: Standing: Standing QL stretch R, 3x30 Stretches:  Prone quad stretch bil  2x40;  LTR 20 sec x 4;    Supine piriformis stretch Bil 2x30 Neuromuscular Re-education: Manual Therapy: STM/IASTM to R hip, gr troch and glute med/min  Therapeutic Activity: Self Care:   PATIENT EDUCATION:  PATIENT EDUCATION:  Education details: PT POC, Exam findings, HEP Person educated: Patient Education method: Programmer, Multimedia, Demonstration, Tactile cues, Verbal cues, and Handouts Education comprehension: verbalized understanding, returned demonstration, verbal cues required, tactile cues required, and needs further education    HOME EXERCISE PROGRAM: Access Code: [email or web address]   ASSESSMENT:  CLINICAL IMPRESSION:  05/27/2023  Pt with improving pain . She continues to have mild pain at end of day and soreness to palpate but overall improving. She has difficulty with strengthening exercises today, and requires max cuing for speed and mechanics. Pt to benefit from continued strengthening for R hip, especially in weight bearing positions.    Eval: Patient is a 63 y.o.f who was seen today for physical therapy evaluation and treatment for bilateral hip pain (  R>L) with signs and symptoms consistent with greater trochanteric pain syndrome. Her primary impairments include hip mobility deficits, hip muscle performance deficits, hip muscle length deficits, and balance deficits. These deficits limit her ability to walk, walk her dog, navigate stairs, carrying items > 2#, mop, and vacuum without pain/limitation. Pt would benefit from skilled PT to address the above impairments.   OBJECTIVE IMPAIRMENTS: Abnormal gait, decreased balance, decreased coordination, decreased endurance, difficulty  walking, decreased ROM, decreased strength, hypomobility, impaired flexibility, and pain.   ACTIVITY LIMITATIONS: carrying, lifting, bending, sitting, sleeping, stairs, and locomotion level  PARTICIPATION LIMITATIONS: cleaning, laundry, driving, and community activity  PERSONAL FACTORS: Past/current experiences and 3+ comorbidities: R hip OA, osteopenia, osteoporosis, DM, and HTN,  are also affecting patient's functional outcome.   REHAB POTENTIAL: Good  CLINICAL DECISION MAKING: Stable/uncomplicated  EVALUATION COMPLEXITY: Low   GOALS: Goals reviewed with patient? Yes  SHORT TERM GOALS: Target date: 05/01/2023 Pt will be independent in initial HEP  Goal status:MET  2.  Pt will report 50% improvement in her ability to mop/vacuum. Goal status: MET   LONG TERM GOALS: Target date: 06/05/2023  Pt will be independent in final HEP.   Goal status: INITIAL  2.  Pt will demo ability to carry at least 10# with pain less than 3/10 to promote return to ADLs. Goal status: INITIAL  3.  Pt will report 75% improvement in the ability to walk for at least 20 mins.  Goal status: INITIAL  4.  Pt will demo improved lumbar AROM to [facility] and without pain/limitation to improve ability to complete ADLs. Goal status: INITIAL  5.  Pt will improve FOTO score by at least 10 points to promote return to ADLs.  Baseline:  Goal status: INITIAL  PLAN:  PT FREQUENCY: 1-2x/week  PT DURATION: 8 weeks  PLANNED INTERVENTIONS: 97110-Therapeutic exercises, 97530- Therapeutic activity, 97112- Neuromuscular re-education, 97535- Self Care, [id number]- Manual therapy, [phone]- Gait training, [phone]- Aquatic Therapy, Patient/Family education, Balance training, Stair training, Taping, Dry Needling, Joint mobilization, Spinal mobilization, Vestibular training, Cryotherapy, and Moist heat  PLAN FOR NEXT SESSION: Continue to promote weighted carries and LE strengthening    Tinnie Don, PT, DPT 10:16 AM  05/27/23

## 2023-05-29 ENCOUNTER — Encounter: Payer: Self-pay | Admitting: Dietician

## 2023-05-29 ENCOUNTER — Encounter: Payer: 59 | Attending: Family Medicine | Admitting: Dietician

## 2023-05-29 DIAGNOSIS — Z713 Dietary counseling and surveillance: Secondary | ICD-10-CM | POA: Diagnosis not present

## 2023-05-29 DIAGNOSIS — E1169 Type 2 diabetes mellitus with other specified complication: Secondary | ICD-10-CM | POA: Diagnosis not present

## 2023-05-29 DIAGNOSIS — Z794 Long term (current) use of insulin: Secondary | ICD-10-CM | POA: Diagnosis not present

## 2023-05-29 NOTE — Patient Instructions (Signed)
 Pt states she would like to use nutrition labels for carb consistent intake

## 2023-05-29 NOTE — Progress Notes (Signed)
 Diabetes Self-Management Education  Visit Type: First/Initial  Appt. Start Time: 1630 Appt. End Time: 1733  05/29/2023  [name], identified by name and date of birth, is a 63 y.o. female with a diagnosis of Diabetes: Type 2.   ASSESSMENT   Patient is here today with her husband Marcey.  Patient would like to learn about carbs blood sugar monitoring and physical activity. Patient lives with her husband.  Pt reports shared shopping and Pt does the cooking.Pt reports she has made the following changes including reducing carb intake of specific foods stating bread, bagels and pasta.   History includes:   Past Medical History:  Diagnosis Date   Allergy    fall seasonal   Anxiety    Blood transfusion    Platelet transfusion when on heparin   Breast cancer (HCC) 2001   R breast(lumpectomy,chemo,radiation,tamoxifen)   C. difficile diarrhea 02/2016   treated with Flagyl    Complication of anesthesia    gets Lavone going under she has been told   COVID-19 09/2020   Depression    treated   Diabetes mellitus    type 2   GERD (gastroesophageal reflux disease)    Hyperlipidemia    Hypertension    resolved after weight loss surgery; recurred 2013   Insomnia chronic   Iron deficiency    h/o   Microalbuminuria    h/o   OA (osteoarthritis) of knee    Osteoporosis    Personal history of chemotherapy    Personal history of radiation therapy    Plantar fasciitis (07') DrRegal   s/p surgical release 01/2011   Sleep apnea    resolved after weight loss surgery; recurrent with weight gain; on CPAP   Vitamin D  deficiency     Labs noted:   Lab Results  Component Value Date   HGBA1C 7.1 (A) 03/18/2023    Medications include:   Current Outpatient Medications:    acetaminophen  (TYLENOL ) 500 MG tablet, Take 1,000 mg by mouth every 6 (six) hours as needed., Disp: , Rfl:    alendronate  (FOSAMAX ) 70 MG tablet, TAKE 1 TABLET BY MOUTH EVERY 7 DAYS. TAKE WITH A FULL GLASS OF WATER   ON AN EMPTY STOMACH., Disp: 4 tablet, Rfl: 2   Alpha-D-Galactosidase (BEANO PO), Take 1 tablet by mouth daily., Disp: , Rfl:    ascorbic acid (VITAMIN C) 500 MG tablet, Take 500 mg by mouth daily., Disp: , Rfl:    Calcium Carb-Cholecalciferol (CALCIUM 600 + D PO), Take 1 tablet by mouth 2 (two) times daily., Disp: , Rfl:    cetirizine (ZYRTEC) 10 MG tablet, Take 10 mg by mouth daily., Disp: , Rfl:    Dulaglutide  (TRULICITY ) 3 MG/0.5ML SOAJ, INJECT 3 MG AS DIRECTED ONCE A WEEK., Disp: 2 mL, Rfl: 0   lisinopril  (ZESTRIL ) 10 MG tablet, Take 1 tablet (10 mg total) by mouth daily., Disp: 90 tablet, Rfl: 1   LYSINE PO, Take 1 tablet by mouth daily as needed (cold sore)., Disp: , Rfl:    metFORMIN  (GLUCOPHAGE -XR) 500 MG 24 hr tablet, Take 2 tablets (1,000 mg total) by mouth daily., Disp: 180 tablet, Rfl: 1   Multiple Vitamin (MULTIVITAMIN WITH MINERALS) TABS tablet, Take 1 tablet by mouth 2 (two) times daily., Disp: , Rfl:    Multiple Vitamins-Minerals (HAIR SKIN AND NAILS FORMULA PO), Take 1 tablet by mouth at bedtime., Disp: , Rfl:    naproxen  sodium (ALEVE ) 220 MG tablet, Take 440 mg by mouth daily as needed., Disp: ,  Rfl:    Omega-3 1000 MG CAPS, Take 1,000 mg by mouth 2 (two) times daily., Disp: , Rfl:    omeprazole  (PRILOSEC  OTC) 20 MG tablet, Take 1 capsule up to twice daily for the next 1-2 weeks, Disp: 28 tablet, Rfl: 0   PARoxetine  (PAXIL ) 20 MG tablet, Take 1 tablet (20 mg total) by mouth daily., Disp: 90 tablet, Rfl: 1   simethicone (MYLICON) 125 MG chewable tablet, Chew 125 mg by mouth every 6 (six) hours as needed for flatulence., Disp: , Rfl:    simvastatin  (ZOCOR ) 20 MG tablet, TAKE 1 TABLET BY MOUTH EVERYDAY AT BEDTIME, Disp: 90 tablet, Rfl: 1   valACYclovir  (VALTREX ) 1000 MG tablet, TAKE 2 TABLETS AT ONSET OF COLD SORE AND REPEAT IN 12 HOURS (4 TABLETS PER EPISODE OF COLD SORE), Disp: 28 tablet, Rfl: 0   vitamin B-12 (CYANOCOBALAMIN) 500 MCG tablet, Take 500 mcg by mouth daily., Disp: ,  Rfl:    zolpidem  (AMBIEN  CR) 6.25 MG CR tablet, Take 1 tablet (6.25 mg total) by mouth at bedtime., Disp: 30 tablet, Rfl: 2   blood glucose meter kit and supplies KIT, Dispense based on patient and insurance preference. Use daily as directed. (Patient not taking: Reported on 05/29/2023), Disp: 1 each, Rfl: 0   glucose blood test strip, 1 each by Other route daily. Use as instructed, Disp: 50 each, Rfl: 2    There were no vitals taken for this visit. There is no height or weight on file to calculate BMI.   Diabetes Self-Management Education - 05/29/23 1641       Visit Information   Visit Type First/Initial      Initial Visit   Diabetes Type Type 2    Date Diagnosed 20 years ago    Are you currently following a meal plan? No    Are you taking your medications as prescribed? Yes      Psychosocial Assessment   Patient Belief/Attitude about Diabetes Motivated to manage diabetes    What is the hardest part about your diabetes right now, causing you the most concern, or is the most worrisome to you about your diabetes?   Being active    Self-care barriers None    Self-management support Doctor's office    Other persons present Patient;Spouse/SO    Patient Concerns Weight Control;Healthy Lifestyle;Nutrition/Meal planning;Monitoring    Special Needs None    Preferred Learning Style No preference indicated    Learning Readiness Change in progress    How often do you need to have someone help you when you read instructions, pamphlets, or other written materials from your doctor or pharmacy? 1 - Never    What is the last grade level you completed in school? college      Pre-Education Assessment   Patient understands the diabetes disease and treatment process. Needs Instruction    Patient understands incorporating nutritional management into lifestyle. Needs Instruction    Patient undertands incorporating physical activity into lifestyle. Needs Instruction    Patient understands using  medications safely. Needs Instruction    Patient understands monitoring blood glucose, interpreting and using results Needs Instruction    Patient understands prevention, detection, and treatment of acute complications. Needs Instruction    Patient understands prevention, detection, and treatment of chronic complications. Needs Instruction    Patient understands how to develop strategies to address psychosocial issues. Needs Instruction    Patient understands how to develop strategies to promote health/change behavior. Needs Instruction  Complications   Last HgB A1C per patient/outside source 7.1 %    How often do you check your blood sugar? 0 times/day (not testing)    Have you had a dilated eye exam in the past 12 months? Yes    Have you had a dental exam in the past 12 months? Yes    Are you checking your feet? Yes    How many days per week are you checking your feet? 7      Dietary Intake   Breakfast bacon, egg, cheese, flour tortilla warp , coffee plain    Lunch leftovers or olive garden or pizza or 2 slices of whole wheat, mayo, ham and cheese    Snack (afternoon) cheese, cheezits, crackers or chipr or fritos    Advice worker or beef and mushrooms pizza or fish sticks with tater tots with corn or green beans or tacos with lettuce, cheese, beef,    Beverage(s) coffee plain, water       Activity / Exercise   Activity / Exercise Type Light (walking / raking leaves)    How many days per week do you exercise? 2    How many minutes per day do you exercise? 5    Total minutes per week of exercise 10      Patient Education   Previous Diabetes Education Yes (please comment)    Disease Pathophysiology Definition of diabetes, type 1 and 2, and the diagnosis of diabetes    Healthy Eating Carbohydrate counting;Plate Method;Reviewed blood glucose goals for pre and post meals and how to evaluate the patients' food intake on their blood glucose level.;Role of diet in the  treatment of diabetes and the relationship between the three main macronutrients and blood glucose level    Being Active Helped patient identify appropriate exercises in relation to his/her diabetes, diabetes complications and other health issue.    Medications Reviewed patients medication for diabetes, action, purpose, timing of dose and side effects.    Monitoring Identified appropriate SMBG and/or A1C goals.    Chronic complications Relationship between chronic complications and blood glucose control    Diabetes Stress and Support Role of stress on diabetes    Preconception care Other (comment)   n/a   Lifestyle and Health Coping Helped patient develop diabetes management plan for (enter comment)      Individualized Goals (developed by patient)   Nutrition Follow meal plan discussed    Physical Activity 15 minutes per day;Exercise 3-5 times per week    Medications take my medication as prescribed    Monitoring  Test my blood glucose as discussed    Problem Solving Eating Pattern;Addressing barriers to behavior change    Reducing Risk do foot checks daily      Post-Education Assessment   Patient understands the diabetes disease and treatment process. Needs Review    Patient understands incorporating nutritional management into lifestyle. Needs Review    Patient undertands incorporating physical activity into lifestyle. Needs Review    Patient understands using medications safely. Needs Review    Patient understands monitoring blood glucose, interpreting and using results Needs Review    Patient understands prevention, detection, and treatment of acute complications. Needs Review    Patient understands prevention, detection, and treatment of chronic complications. Needs Review    Patient understands how to develop strategies to address psychosocial issues. Needs Review    Patient understands how to develop strategies to promote health/change behavior. Needs Review      Outcomes  Expected Outcomes Demonstrated interest in learning. Expect positive outcomes    Future DMSE 3-4 months    Program Status Not Completed             Individualized Plan for Diabetes Self-Management Training:   Learning Objective:  Patient will have a greater understanding of diabetes self-management. Patient education plan is to attend individual and/or group sessions per assessed needs and concerns.   Plan:   Patient Instructions  Pt states she would like to use nutrition labels for carb consistent intake  Expected Outcomes:  Demonstrated interest in learning. Expect positive outcomes  Education material provided: ADA - How to Thrive: A Guide for Your Journey with Diabetes, My Plate, and Snack sheet  If problems or questions, patient to contact team via:  Phone  Future DSME appointment: 3-4 months

## 2023-06-03 ENCOUNTER — Encounter: Payer: Self-pay | Admitting: Physical Therapy

## 2023-06-03 ENCOUNTER — Ambulatory Visit: Payer: 59 | Admitting: Physical Therapy

## 2023-06-03 DIAGNOSIS — M6281 Muscle weakness (generalized): Secondary | ICD-10-CM | POA: Diagnosis not present

## 2023-06-03 DIAGNOSIS — M25552 Pain in left hip: Secondary | ICD-10-CM | POA: Diagnosis not present

## 2023-06-03 DIAGNOSIS — M25551 Pain in right hip: Secondary | ICD-10-CM

## 2023-06-03 DIAGNOSIS — M25651 Stiffness of right hip, not elsewhere classified: Secondary | ICD-10-CM

## 2023-06-03 NOTE — Therapy (Signed)
 OUTPATIENT PHYSICAL THERAPY LOWER EXTREMITY Treatment/Re-Cert    Patient Name: Julie Fischer MRN: [id number] DOB:07-28-1960, 63 y.o., female Today's Date: 06/03/2023  END OF SESSION:  PT End of Session - 06/03/23 1055     Visit Number 10    Number of Visits 16    Date for PT Re-Evaluation 07/15/23    Authorization Type [employer], recert at visit 10.    PT Start Time 1021    PT Stop Time 1100    PT Time Calculation (min) 39 min    Activity Tolerance Patient tolerated treatment well;No increased pain    Behavior During Therapy [facility] for tasks assessed/performed                    Past Medical History:  Diagnosis Date   Allergy    fall seasonal   Anxiety    Blood transfusion    Platelet transfusion when on heparin   Breast cancer (HCC) 2001   R breast(lumpectomy,chemo,radiation,tamoxifen)   C. difficile diarrhea 02/2016   treated with Flagyl    Complication of anesthesia    gets Lavone going under she has been told   COVID-19 09/2020   Depression    treated   Diabetes mellitus    type 2   GERD (gastroesophageal reflux disease)    Hyperlipidemia    Hypertension    resolved after weight loss surgery; recurred 2013   Insomnia chronic   Iron deficiency    h/o   Microalbuminuria    h/o   OA (osteoarthritis) of knee    Osteoporosis    Personal history of chemotherapy    Personal history of radiation therapy    Plantar fasciitis (07') DrRegal   s/p surgical release 01/2011   Sleep apnea    resolved after weight loss surgery; recurrent with weight gain; on CPAP   Vitamin D  deficiency    Past Surgical History:  Procedure Laterality Date   APPENDECTOMY     BREAST LUMPECTOMY Right 11/1999   COLONOSCOPY  2002, 2012   cuboid stress fracture Right 12/2002   FOOT SURGERY Left 2008   GASTRIC ROUX-EN-Y  (DrNewman) 3/04   left shoulder fracture repair  (DrMortensen) 12/05   MYRINGOTOMY     tubes B/L, multiple sets   plantar fasciitis release Right 01/30/2011    Dr. Magdalen   TONSILLECTOMY AND ADENOIDECTOMY     TOTAL KNEE ARTHROPLASTY Left 04/08/2017   Procedure: LEFT TOTAL KNEE ARTHROPLASTY;  Surgeon: Melodi Lerner, MD;  Location: [facility];  Service: Orthopedics;  Laterality: Left;   TOTAL KNEE ARTHROPLASTY Right 01/06/2018   Procedure: RIGHT TOTAL KNEE ARTHROPLASTY;  Surgeon: Melodi Lerner, MD;  Location: [facility];  Service: Orthopedics;  Laterality: Right;   TOTAL VAGINAL HYSTERECTOMY  2000   and RSO(endometriosis)   Patient Active Problem List   Diagnosis Date Noted   Right hip pain 05/25/2020   Fall 05/25/2020   Frequent urination 04/10/2019   Dysuria 04/10/2019   Vaginal dryness 04/10/2019   Vaginitis, atrophic 04/10/2019   Post-menopausal 04/10/2019   S/P hysterectomy 04/10/2019   Stiffness of right knee 01/10/2018   Family hx of colon cancer 06/05/2017   S/P TKR (total knee replacement), left 06/05/2017   Depressive disorder 05/24/2017   OA (osteoarthritis) of knee 04/08/2017   Eustachian tube dysfunction, right 03/19/2017   Mixed conductive and sensorineural hearing loss of right ear with restricted hearing of left ear 03/19/2017   History of bariatric surgery 07/29/2013   Obesity (BMI 30-39.9) 07/29/2013  Osteoporosis 01/28/2013   Allergic rhinitis 01/28/2013   Anemia, iron deficiency 07/30/2012   Impaired fasting glucose 12/10/2011   Essential hypertension, benign 12/10/2011   Pure hypercholesterolemia 11/15/2010   Insomnia 11/15/2010   Depression, major, in remission (HCC) 11/15/2010   Osteopenia 11/15/2010   History of breast cancer 11/15/2010    PCP: Randol Dawes, MD  REFERRING PROVIDER: Joane Artist RAMAN, MD  REFERRING DIAG: Right hip pain  THERAPY DIAG:  Pain in right hip  Stiffness of right hip, not elsewhere classified  Pain in left hip  Muscle weakness (generalized)  Rationale for Evaluation and Treatment: Rehabilitation  ONSET DATE: Two years   SUBJECTIVE:   SUBJECTIVE STATEMENT:  Pt states R hip  feeling quite a bit better. She was able to walk around a show this weekend, with very little pain. Still feeling soreness at times,( in hip)  mostly when carrying heavier objects.  Has been seen for 10 visits   Eval: Pt states she has had hip pain for 2 years. Pt states she does her best to not let her pain stop her from activity but it does limit her ability to complete ADLs at her optimal level. Pt is retired and enjoys walking, staying active, and volunteering in her church. Pt has difficulty with walking, navigating stairs, walking her dog, mopping, vacuuming, laying on it, and carrying >1-2#. Pt denies N/T.  PERTINENT HISTORY: OA R hip, osteopenia, osteoporosis, DM, HTN,   PAIN:  Are you having pain? Yes: NPRS scale eval: up to 0-5/10 with activity   Pain location: Bil hips R>L Pain description: dull /bruise Aggravating factors: Walking, navigating stairs, walking her dog, mopping, vacuuming, sleeping on her R hip, and carrying > 2# Relieving factors: Heat and ice which help for 30 mins max; Aleve  which helps for a few hours  PRECAUTIONS: None  RED FLAGS: None   WEIGHT BEARING RESTRICTIONS: No  FALLS:  Has patient fallen in last 6 months? No  LIVING ENVIRONMENT: Lives with: lives with their family and lives with their spouse Lives in: House/apartment Stairs: Yes: Internal: 1 flight steps;   OCCUPATION: Retired   PLOF: Independent  PATIENT GOALS: Reduce pain with walking, carrying items >2#, navigating stairs, mopping, vacuuming, and walking her dog.  NEXT MD VISIT: January 2025  OBJECTIVE:   DIAGNOSTIC FINDINGS: X-ray showed R hip OA;   PATIENT SURVEYS:  FOTO 52 Visit: 8:    22   COGNITION: Overall cognitive status: Within functional limits for tasks assessed      MUSCLE LENGTH: Hamstrings: Right WFL deg; Left WFL deg  POSTURE:   PALPATION: Mild tenderness with palpation on R.   LOWER EXTREMITY MMT:  MMT Right 06/03/23 Left 06/03/23  Hip flexion 5 5   Hip extension    Hip abduction 4 4  Hip adduction    Hip internal rotation 4 4  Hip external rotation 4 4  Knee flexion 5 5  Knee extension 5 5  Ankle dorsiflexion    Ankle plantarflexion    Ankle inversion    Ankle eversion     (Blank rows = not tested) LUMBAR ROM:   Active  A/PROM  eval 06/03/23  Flexion WFL wfl  Extension WFL with p! in R hip Wfl, mild pinch/pain into R glute   Right lateral flexion WFL with p! in R hip WFL  Left lateral flexion [facility] WFL  Right rotation WFL   Left rotation WFL    (Blank rows = not tested)   LOWER EXTREMITY  SPECIAL TESTS:    FUNCTIONAL TESTS:    GAIT: Distance walked: 50 ft Assistive device utilized: None Level of assistance: Complete Independence Comments: Min increased frontal plane translation to the L, min reduced trunk rotation   TODAY'S TREATMENT:                                                                                                                              DATE:   06/03/2023 Therapeutic Exercise: Aerobic:  bike L2 x 5  min Supine:   bridging x 15 S/L:   hip abd 2 x 10 bil;   Prone:  Seated:  sit to stand 5lb x 10;   Standing:  hip abd and ext  2 x 10 ea bil;  Step ups:  lateral (6in )step ups  x 10 ea bil no hands;  Marching no UE support x 25;   Stretches:  seated piriformis 20 sec x 3 bil;   Neuromuscular Re-education:    Manual Therapy:     Previous Therapeutic Exercise: Aerobic: Supine:  S/L:  hip abd 2 x 10 bil;   clams 2 x 10 bil;  Prone:  Seated: thoracic extension with towel roll x 10/slow ;  seated thoracic/lumbar flexion 20 sec x 3;  Standing: Side stepping 10 ft x 6 bil;  hip abd and ext 2 x 10 ea bil;  Stretches:   LTR 20 sec x 4;   Supine piriformis stretch Bil 2x30; SKTC bil 2x30 Neuromuscular Re-education:    Manual Therapy:   PA mobs to t-spine; R lower rib mobs; Seated: therapist assisted thoracic extension x 10;  Therapeutic Activity: Self Care:   Therapeutic  Exercise: Aerobic: Supine:  SLR x 15 bil;  S/L: hip abd 2 x 10 bil;   Clams 2x10 bil;  Prone: Hip ext x 10 bil;  Seated: Standing: Standing QL stretch R, 3x30 Stretches:  Prone quad stretch bil  2x40;  LTR 20 sec x 4;    Supine piriformis stretch Bil 2x30 Neuromuscular Re-education: Manual Therapy: STM/IASTM to R hip, gr troch and glute med/min  Therapeutic Activity: Self Care:   PATIENT EDUCATION:  PATIENT EDUCATION:   Education details: PT POC, Exam findings, HEP Person educated: Patient Education method: Programmer, Multimedia, Demonstration, Tactile cues, Verbal cues, and Handouts Education comprehension: verbalized understanding, returned demonstration, verbal cues required, tactile cues required, and needs further education   HOME EXERCISE PROGRAM: Access Code: [email or web address]   ASSESSMENT:  CLINICAL IMPRESSION:  8/86/7974  Re-Cert: Pt progressing well. She has much decreased pain in R hip. She has improving strength as well, seen with measurements today, as well as improved hip drop when performing single leg activities. She will benefit from continued care for 1-3 more visits, to continue hip strength for improving mechanics and ability for functional activity without pain, as well as to meet LTGs.   Eval: Patient is a 63 y.o.f who was seen today for physical therapy evaluation and treatment for bilateral  hip pain (R>L) with signs and symptoms consistent with greater trochanteric pain syndrome. Her primary impairments include hip mobility deficits, hip muscle performance deficits, hip muscle length deficits, and balance deficits. These deficits limit her ability to walk, walk her dog, navigate stairs, carrying items > 2#, mop, and vacuum without pain/limitation. Pt would benefit from skilled PT to address the above impairments.   OBJECTIVE IMPAIRMENTS: Abnormal gait, decreased balance, decreased coordination, decreased endurance, difficulty walking, decreased ROM, decreased strength,  hypomobility, impaired flexibility, and pain.   ACTIVITY LIMITATIONS: carrying, lifting, bending, sitting, sleeping, stairs, and locomotion level  PARTICIPATION LIMITATIONS: cleaning, laundry, driving, and community activity  PERSONAL FACTORS: Past/current experiences and 3+ comorbidities: R hip OA, osteopenia, osteoporosis, DM, and HTN,  are also affecting patient's functional outcome.   REHAB POTENTIAL: Good  CLINICAL DECISION MAKING: Stable/uncomplicated  EVALUATION COMPLEXITY: Low   GOALS: Goals reviewed with patient? Yes  SHORT TERM GOALS: Target date: 05/01/2023 Pt will be independent in initial HEP  Goal status:MET  2.  Pt will report 50% improvement in her ability to mop/vacuum. Goal status: MET   LONG TERM GOALS: Target date: 2/ 24/25  Pt will be independent in final HEP.   Goal status: partially MET   2.  Pt will demo ability to carry at least 10# with pain less than 3/10 to promote return to ADLs. Goal status: ongoing   3.  Pt will report 75% improvement in the ability to walk for at least 20 mins.  Goal status: MET  4.  Pt will demo improved lumbar AROM to [facility] and without pain/limitation to improve ability to complete ADLs. Goal status: MET  5.  Pt will improve FOTO score by at least 10 points to promote return to ADLs.  Baseline:  Goal status: MET  PLAN:  PT FREQUENCY: 1-2x/week  PT DURATION: 6 weeks  PLANNED INTERVENTIONS: 97110-Therapeutic exercises, 97530- Therapeutic activity, 97112- Neuromuscular re-education, 97535- Self Care, [id number]- Manual therapy, [phone]- Gait training, [phone]- Aquatic Therapy, Patient/Family education, Balance training, Stair training, Taping, Dry Needling, Joint mobilization, Spinal mobilization, Vestibular training, Cryotherapy, and Moist heat  PLAN FOR NEXT SESSION: review bridging (not on HEP), and weighted carries    Tinnie Don, PT, DPT 12:45 PM  06/03/23

## 2023-06-04 ENCOUNTER — Encounter: Payer: Self-pay | Admitting: Family Medicine

## 2023-06-04 ENCOUNTER — Other Ambulatory Visit: Payer: Self-pay | Admitting: *Deleted

## 2023-06-04 DIAGNOSIS — E1169 Type 2 diabetes mellitus with other specified complication: Secondary | ICD-10-CM

## 2023-06-04 MED ORDER — GLUCOSE BLOOD VI STRP: 1.0000 | ORAL_STRIP | Freq: Two times a day (BID) | 5 refills | Status: AC

## 2023-06-04 MED ORDER — BLOOD GLUCOSE MONITORING SUPPL DEVI: 0 refills | Status: AC

## 2023-06-04 MED ORDER — LANCETS 33G MISC: 5 refills | Status: AC

## 2023-06-13 ENCOUNTER — Encounter: Payer: Self-pay | Admitting: Family Medicine

## 2023-06-13 DIAGNOSIS — G4700 Insomnia, unspecified: Secondary | ICD-10-CM

## 2023-06-13 MED ORDER — ZOLPIDEM TARTRATE ER 6.25 MG PO TBCR: 6.2500 mg | EXTENDED_RELEASE_TABLET | Freq: Every day | ORAL | 0 refills | Status: DC

## 2023-06-17 ENCOUNTER — Ambulatory Visit: Payer: 59 | Admitting: Physical Therapy

## 2023-06-17 DIAGNOSIS — M25551 Pain in right hip: Secondary | ICD-10-CM | POA: Diagnosis not present

## 2023-06-17 NOTE — Therapy (Signed)
OUTPATIENT PHYSICAL THERAPY LOWER EXTREMITY Treatment   Patient Name: Julie Fischer MRN: [id number] DOB:[birth year], 63 y.o., female Today's Date: 06/17/2023  END OF SESSION:  PT End of Session - 06/17/23 1610     Visit Number 11    Number of Visits 16    Date for PT Re-Evaluation 07/15/23    Authorization Type [employer], recert at visit 10.    PT Start Time 1609    PT Stop Time 1647    PT Time Calculation (min) 38 min    Activity Tolerance Patient tolerated treatment well;No increased pain    Behavior During Therapy [facility] for tasks assessed/performed                     Past Medical History:  Diagnosis Date   Allergy    fall seasonal   Anxiety    Blood transfusion    Platelet transfusion when on heparin   Breast cancer (HCC) 2001   R breast(lumpectomy,chemo,radiation,tamoxifen)   C. difficile diarrhea 02/2016   treated with Flagyl   Complication of anesthesia    gets Blanca Friend going under she has been told   COVID-19 09/2020   Depression    treated   Diabetes mellitus    type 2   GERD (gastroesophageal reflux disease)    Hyperlipidemia    Hypertension    resolved after weight loss surgery; recurred 2013   Insomnia chronic   Iron deficiency    h/o   Microalbuminuria    h/o   OA (osteoarthritis) of knee    Osteoporosis    Personal history of chemotherapy    Personal history of radiation therapy    Plantar fasciitis (07') DrRegal   s/p surgical release 01/2011   Sleep apnea    resolved after weight loss surgery; recurrent with weight gain; on CPAP   Vitamin D deficiency    Past Surgical History:  Procedure Laterality Date   APPENDECTOMY     BREAST LUMPECTOMY Right 11/1999   COLONOSCOPY  2002, 2012   cuboid stress fracture Right 12/2002   FOOT SURGERY Left 2008   GASTRIC ROUX-EN-Y  (DrNewman) 3/04   left shoulder fracture repair  (DrMortensen) 12/05   MYRINGOTOMY     tubes B/L, multiple sets   plantar fasciitis release Right 01/30/2011   Dr.  Charlsie Merles   TONSILLECTOMY AND ADENOIDECTOMY     TOTAL KNEE ARTHROPLASTY Left 04/08/2017   Procedure: LEFT TOTAL KNEE ARTHROPLASTY;  Surgeon: Ollen Gross, MD;  Location: [facility];  Service: Orthopedics;  Laterality: Left;   TOTAL KNEE ARTHROPLASTY Right 01/06/2018   Procedure: RIGHT TOTAL KNEE ARTHROPLASTY;  Surgeon: Ollen Gross, MD;  Location: [facility];  Service: Orthopedics;  Laterality: Right;   TOTAL VAGINAL HYSTERECTOMY  2000   and RSO(endometriosis)   Patient Active Problem List   Diagnosis Date Noted   Right hip pain 05/25/2020   Fall 05/25/2020   Frequent urination 04/10/2019   Dysuria 04/10/2019   Vaginal dryness 04/10/2019   Vaginitis, atrophic 04/10/2019   Post-menopausal 04/10/2019   S/P hysterectomy 04/10/2019   Stiffness of right knee 01/10/2018   Family hx of colon cancer 06/05/2017   S/P TKR (total knee replacement), left 06/05/2017   Depressive disorder 05/24/2017   OA (osteoarthritis) of knee 04/08/2017   Eustachian tube dysfunction, right 03/19/2017   Mixed conductive and sensorineural hearing loss of right ear with restricted hearing of left ear 03/19/2017   History of bariatric surgery 07/29/2013   Obesity (BMI 30-39.9) 07/29/2013  Osteoporosis 01/28/2013   Allergic rhinitis 01/28/2013   Anemia, iron deficiency 07/30/2012   Impaired fasting glucose 12/10/2011   Essential hypertension, benign 12/10/2011   Pure hypercholesterolemia 11/15/2010   Insomnia 11/15/2010   Depression, major, in remission (HCC) 11/15/2010   Osteopenia 11/15/2010   History of breast cancer 11/15/2010    PCP: Joselyn Arrow, MD  REFERRING PROVIDER: Rodolph Bong, MD  REFERRING DIAG: Right hip pain  THERAPY DIAG:  Pain in right hip  Rationale for Evaluation and Treatment: Rehabilitation  ONSET DATE: Two years   SUBJECTIVE:   SUBJECTIVE STATEMENT:  Pt states doing very well. Thinks hip is much better, feels it at times, mostly if she lays on it. Has been diligent with  HEP  Eval: Pt states she has had hip pain for 2 years. Pt states she does her best to not let her pain stop her from activity but it does limit her ability to complete ADLs at her optimal level. Pt is retired and enjoys walking, staying active, and volunteering in her church. Pt has difficulty with walking, navigating stairs, walking her dog, mopping, vacuuming, laying on it, and carrying >1-2#. Pt denies N/T.  PERTINENT HISTORY: OA R hip, osteopenia, osteoporosis, DM, HTN,   PAIN:  Are you having pain? Yes: NPRS scale eval: up to 0-2/10 with activity   Pain location: Bil hips R>L Pain description: dull /bruise Aggravating factors: Walking, navigating stairs, walking her dog, mopping, vacuuming, sleeping on her R hip, and carrying > 2# Relieving factors: Heat and ice which help for 30 mins max; Aleve which helps for a few hours  PRECAUTIONS: None  RED FLAGS: None   WEIGHT BEARING RESTRICTIONS: No  FALLS:  Has patient fallen in last 6 months? No  LIVING ENVIRONMENT: Lives with: lives with their family and lives with their spouse Lives in: House/apartment Stairs: Yes: Internal: 1 flight steps;   OCCUPATION: Retired   PLOF: Independent  PATIENT GOALS: Reduce pain with walking, carrying items >2#, navigating stairs, mopping, vacuuming, and walking her dog.  NEXT MD VISIT: January 2025  OBJECTIVE:   DIAGNOSTIC FINDINGS: X-ray showed R hip OA;   PATIENT SURVEYS:  FOTO 52 Visit: 8:    64 Visit 11:d/c  89.6    COGNITION: Overall cognitive status: Within functional limits for tasks assessed      MUSCLE LENGTH: Hamstrings: Right WFL deg; Left WFL deg  POSTURE:   PALPATION:   LOWER EXTREMITY MMT:  MMT Right 06/03/23 Left 06/03/23  Hip flexion 5 5  Hip extension    Hip abduction 4 4  Hip adduction    Hip internal rotation 4 4  Hip external rotation 4 4  Knee flexion 5 5  Knee extension 5 5  Ankle dorsiflexion    Ankle plantarflexion    Ankle inversion     Ankle eversion     (Blank rows = not tested) LUMBAR ROM:   Active  A/PROM  eval 06/03/23  Flexion WFL wfl  Extension WFL with p! in R hip Wfl, mild pinch/pain into R glute   Right lateral flexion WFL with p! in R hip WFL  Left lateral flexion [facility] WFL  Right rotation WFL   Left rotation WFL    (Blank rows = not tested)   LOWER EXTREMITY SPECIAL TESTS:    FUNCTIONAL TESTS:    GAIT: Distance walked: 50 ft Assistive device utilized: None Level of assistance: Complete Independence Comments: Min increased frontal plane translation to the L, min reduced trunk rotation  TODAY'S TREATMENT:                                                                                                                              DATE:   06/17/2023 Therapeutic Exercise: Aerobic:  bike L2 x 8  min Supine:   bridging  2 x 10 S/L:   Prone:  Seated:  sit to stand 6lb x 20;   Standing:  hip abd and ext  2 x 10 ea bil;  Step ups: fwd and lateral (6in )step ups  x 10 ea bil no hands;   Carrys 8lb 45 ft x 2 bil;   Stretches:  seated piriformis 20 sec x 3 bil;   Neuromuscular Re-education:    Manual Therapy:     Previous Therapeutic Exercise: Aerobic: Supine:  S/L:  hip abd 2 x 10 bil;   clams 2 x 10 bil;  Prone:  Seated: thoracic extension with towel roll x 10/slow ;  seated thoracic/lumbar flexion 20 sec x 3;  Standing: Side stepping 10 ft x 6 bil;  hip abd and ext 2 x 10 ea bil;  Stretches:   LTR 20 sec x 4;   Supine piriformis stretch Bil 2x30"; SKTC bil 2x30" Neuromuscular Re-education:    Manual Therapy:   PA mobs to t-spine; R lower rib mobs; Seated: therapist assisted thoracic extension x 10;  Therapeutic Activity: Self Care:    PATIENT EDUCATION:  PATIENT EDUCATION:   Education details: reviewed and updated HEP Person educated: Patient Education method: Explanation, Demonstration, Tactile cues, Verbal cues, and Handouts Education comprehension: verbalized understanding,  returned demonstration, verbal cues required, tactile cues required, and needs further education   HOME EXERCISE PROGRAM: Access Code: [email or web address]   ASSESSMENT:  CLINICAL IMPRESSION:  06/17/2023  Pt has made good progress, and has met goals at this time. She is performing all regular functional activities without pain. Recommended f/u with sports med in future as needed if pain returns. Updated and reviewed final HEP today. Pt in agreement with d/c plan.   Eval: Patient is a 63 y.o.f who was seen today for physical therapy evaluation and treatment for bilateral hip pain (R>L) with signs and symptoms consistent with greater trochanteric pain syndrome. Her primary impairments include hip mobility deficits, hip muscle performance deficits, hip muscle length deficits, and balance deficits. These deficits limit her ability to walk, walk her dog, navigate stairs, carrying items > 2#, mop, and vacuum without pain/limitation. Pt would benefit from skilled PT to address the above impairments.   OBJECTIVE IMPAIRMENTS: Abnormal gait, decreased balance, decreased coordination, decreased endurance, difficulty walking, decreased ROM, decreased strength, hypomobility, impaired flexibility, and pain.   ACTIVITY LIMITATIONS: carrying, lifting, bending, sitting, sleeping, stairs, and locomotion level  PARTICIPATION LIMITATIONS: cleaning, laundry, driving, and community activity  PERSONAL FACTORS: Past/current experiences and 3+ comorbidities: R hip OA, osteopenia, osteoporosis, DM, and HTN,  are also affecting patient's functional outcome.   REHAB POTENTIAL: Good  CLINICAL DECISION  MAKING: Stable/uncomplicated  EVALUATION COMPLEXITY: Low   GOALS: Goals reviewed with patient? Yes  SHORT TERM GOALS: Target date: 05/01/2023 Pt will be independent in initial HEP  Goal status:MET  2.  Pt will report 50% improvement in her ability to mop/vacuum. Goal status: MET   LONG TERM GOALS: Target date: 2/  24/25  Pt will be independent in final HEP.   Goal status: partially MET   2.  Pt will demo ability to carry at least 10# with pain less than 3/10 to promote return to ADLs. Goal status: partially MET   3.  Pt will report 75% improvement in the ability to walk for at least 20 mins.  Goal status: MET  4.  Pt will demo improved lumbar AROM to [facility] and without pain/limitation to improve ability to complete ADLs. Goal status: MET  5.  Pt will improve FOTO score by at least 10 points to promote return to ADLs.  Baseline:  Goal status: MET  PLAN:  PT FREQUENCY: 1-2x/week  PT DURATION: 6 weeks  PLANNED INTERVENTIONS: 97110-Therapeutic exercises, 97530- Therapeutic activity, 97112- Neuromuscular re-education, 97535- Self Care, 16109- Manual therapy, [phone]- Gait training, [phone]- Aquatic Therapy, Patient/Family education, Balance training, Stair training, Taping, Dry Needling, Joint mobilization, Spinal mobilization, Vestibular training, Cryotherapy, and Moist heat  PLAN FOR NEXT SESSION:    Sedalia Muta, PT, DPT 4:18 PM  06/17/23  PHYSICAL THERAPY DISCHARGE SUMMARY  Visits from Start of Care: 11   Plan: Patient agrees to discharge.  Patient goals were  met. Patient is being discharged due to meeting the stated rehab goals.

## 2023-06-18 NOTE — Patient Instructions (Incomplete)
  HEALTH MAINTENANCE RECOMMENDATIONS:  It is recommended that you get at least 30 minutes of aerobic exercise at least 5 days/week (for weight loss, you may need as much as 60-90 minutes). This can be any activity that gets your heart rate up. This can be divided in 10-15 minute intervals if needed, but try and build up your endurance at least once a week.  Weight bearing exercise is also recommended twice weekly.  Eat a healthy diet with lots of vegetables, fruits and fiber.  "Colorful" foods have a lot of vitamins (ie green vegetables, tomatoes, red peppers, etc).  Limit sweet tea, regular sodas and alcoholic beverages, all of which has a lot of calories and sugar.  Up to 1 alcoholic drink daily may be beneficial for women (unless trying to lose weight, watch sugars).  Drink a lot of water.  Calcium recommendations are 1200-1500 mg daily (1500 mg for postmenopausal women or women without ovaries), and vitamin D 1000 IU daily.  This should be obtained from diet and/or supplements (vitamins), and calcium should not be taken all at once, but in divided doses.  Monthly self breast exams and yearly mammograms for women over the age of 83 is recommended.  Sunscreen of at least SPF 30 should be used on all sun-exposed parts of the skin when outside between the hours of 10 am and 4 pm (not just when at beach or pool, but even with exercise, golf, tennis, and yard work!)  Use a sunscreen that says "broad spectrum" so it covers both UVA and UVB rays, and make sure to reapply every 1-2 hours.  Remember to change the batteries in your smoke detectors when changing your clock times in the spring and fall. Carbon monoxide detectors are recommended for your home.  Use your seat belt every time you are in a car, and please drive safely and not be distracted with cell phones and texting while driving.  Please schedule your yearly mammogram. Your diabetic eye exam is due in March. Please schedule your bone density  test for August.

## 2023-06-18 NOTE — Progress Notes (Unsigned)
No chief complaint on file.   Julie Fischer is a 63 y.o. female who presents for a complete physical and follow-up on chronic problems.  She is accompanied by her husband.    Right trochanteric bursitis--s/p injection in 08/2022, which helped x 3-4 mos. At her visit in October she reported having pain with walking, carrying things. She was taking 2 Aleve nightly, so didn't bother her when sleeping. On exam at that time she was tender at R trochanteric bursa, but also had some tenderness posteriorly to this and into the deep gluteal muscles.  She was referred to sports medicine. She saw Dr. Denyse Amass in November, was given another cortisone injection, and was referred to PT. She had PT from late November until earlier this week, when she was discharged for having met goals.    Diabetes follow-up:  Last A1c was 7.1 in 02/2023, up from 6.7% in April (down from 7.2% in 05/2022, after increasing Trulicity dose to 3mg ). She reports compliance with Trulicity 3 mg weekly and Metformin ER 1000 mg daily, and denies side effects. At October visit she reported hadn't been good--snacking on licorice, and eating a lot of carbs--breads and pasta (white, not whole grain, eating a full plain bagel with cream cheese for breakfast daily).  She was referred to DM education to further help with improving diet and DM control. She had a visit in early January, with recommended f/u in 3-4 months.  She drinks mostly water. Changes made to diet-- ***UPDATE   Sugars?? *** Exercise?  She denies hypoglycemia, polydipsia, polyuria.  Last diabetic eye exam was 07/2022. She checks her feet regularly and denies concerns.   Hypertension: BP's aren't checked routinely elsewhere.  Reports compliance with lisinopril 10 mg daily.  Denies cough, SE. She denies dizziness, headaches, chest pain, shortness of breath, edema.   BP Readings from Last 3 Encounters:  05/07/23 102/70  03/26/23 102/76  03/18/23 118/72    Depression:  Paxil dose was lowered from 40mg  to 20mg  in 10/2019, and did well at the 20mg  dose. She tried decreasing further to 10mg  after physical in 05/2021, but she had weird dreams, didn't feel like herself.   She continues to do well on the 20mg  dose, and prefers to continue at this dose.   Hyperlipidemia follow-up: Patient is reportedly following a low-fat, low cholesterol diet. She eats mostly chicken. Compliant with medications (simvastatin and fish oil) and denies medication side effects. TG were above goal last year. She continues to try and follow a lowfat diet. Had recent visit with dietician.  Lab Results  Component Value Date   CHOL 140 06/15/2022   HDL 40 06/15/2022   LDLCALC 68 06/15/2022   TRIG 191 (H) 06/15/2022   CHOLHDL 3.5 06/15/2022    Insomnia: requires Ambien CR nightly.  Her dose was decreased to the 6.25mg  dose (from 12.5) at her January 2024 visit, based on concerns mentioned by her husband (staggering around the house, "walking around clueless", was concerned about falls).  She is sleeping well on the lower dose, and is less sedated.   OSA--doing well on CPAP. Compliant with daily use. She denies morning headaches, unrefreshed sleep, daytime somnolence, or any discomfort/problems related to the CPAP machine.  Osteoporosis: She had been treated with Prolia without side effects, switched from fosamax after her 05/2017 DEXA showed T-2.7 at spine. DEXA 10/2019-- T-2.0 at spine (improved). Last injection was in 04/2020. Her husband retired, and her insurance changed to [employer], which didn't cover Prolia, so  she switched back to alendronate. Follow-up bone density 12/2021--T -2.2 at spine (down from 2.0 in 10/2019).  We discussed the slight drop, and she preferred to stay on alendronate (due to the hassle of Prolia re: insurance/cost).  She understands if bone density continues to drop, may need to switch back to Prolia. She reports compliance with taking alendronate on Fridays. She  continues to take Calcium, D,  Weight-bearing exercise--minimal (just laundry, groceries).  H/o breast cancer:  She is no longer under the care of oncologist (previously saw Dr. Donnie Coffin.)  Last mammogram was this past Friday.  She checks her breasts regularly.  She has some intermittent tenderness around her scar (chronic, unchanged). She has noted some tenderness at the right medial breast for about a month. She reports she did not let them know this when she had her mammogram.  She has been coughing for over a month.   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 She had 2-3 flares in the last year. She has been taking Lysine prn, which also helps.   Immunization History  Administered Date(s) Administered   Influenza Split 02/07/2009, 04/10/2010, 04/19/2011, 01/28/2012   Influenza, Seasonal, Injecte, Preservative Fre 03/18/2023   Influenza,inj,Quad PF,6+ Mos 01/28/2013, 01/28/2014, 02/03/2015, 02/23/2016, 03/13/2017, 02/10/2018, 03/09/2019, 03/29/2020, 03/28/2021, 03/20/2022   Influenza,inj,quad, With Preservative 02/18/2017   PFIZER(Purple Top)SARS-COV-2 Vaccination 08/13/2019, 09/07/2019, 04/13/2020, 11/24/2020   PNEUMOCOCCAL CONJUGATE-20 08/22/2022   Pfizer Covid-19 Vaccine Bivalent Booster 40yrs & up 03/28/2021   Pfizer(Comirnaty)Fall Seasonal Vaccine 12 years and older 03/20/2022, 03/18/2023   Pneumococcal Polysaccharide-23 06/04/2001, 11/15/2010, 03/15/2016   Respiratory Syncytial Virus Vaccine,Recomb Aduvanted(Arexvy) 07/10/2022   Td 06/04/2001   Tdap 11/15/2010, 06/09/2020   Zoster Recombinant(Shingrix) 04/01/2018, 06/02/2018   Last Pap smear: s/p hysterectomy   Last mammogram:  05/2022 (required diagnostic on R, done 06/2022). Last colonoscopy: 07/2020, Dr. Barron Alvine: 2 mm rectal hyperplastic polyp, small ascending colon lipoma, grade 2 hemorrhoids. Repeat in 5 years due to family history Last DEXA: 12/2021--T -2.2 at spine  Ophtho: yearly Dentist: twice yearly   Exercise:    Not much exercise--she states she is limited by her bursitis and knee pain.   PMH, PSH, SH and FH reviewed and updated    ROS: The patient denies anorexia, fever, headaches, vision changes, ear pain, chest pain, palpitations, dizziness, syncope, dyspnea on exertion, swelling, nausea, vomiting, constipation, abdominal pain, melena, hematochezia, hematuria, incontinence, dysuria, vaginal bleeding, discharge, odor or itch, genital lesions, numbness, tingling, weakness, tremor, suspicious skin lesions, abnormal bleeding/bruising, or enlarged lymph nodes.   Some dysuria when she was sick with a cold, resolved now. Discolored under her breast, not itchy or changing. Chronic insomnia per HPI Moods well controlled as per HPI. Bilateral knee pain (stairs, hills, walks too much)   Hip pain has been worse since the Fall. Rare heartburn. Weight stable. Cough per HPI.   PHYSICAL EXAM:  There were no vitals taken for this visit.  Wt Readings from Last 3 Encounters:  05/07/23 141 lb (64 kg)  03/26/23 141 lb (64 kg)  03/18/23 143 lb 9.6 oz (65.1 kg)    General Appearance:   Alert, cooperative, no distress, appears stated age    Head:   Normocephalic, without obvious abnormality, atraumatic    Eyes:   PERRL, conjunctiva/corneas clear, EOM's intact, fundi benign    Ears:   Normal TM's and external ear canals. Extruded blue PE tube noted in the right canal, unchanged, surrounded by cerumen, partially occluding. L TM and EAC is normal.  Nose:   Mod edema of nasal mucosa, L>R, no erythema no drainage. Sinuses nontender  Throat:   Normal mucosa, no lesions or erythema  Neck:   Supple, no lymphadenopathy; thyroid: no enlargement/ tenderness/nodules; no carotid bruit or JVD    Back:   Spine nontender, no curvature, ROM normal. No CVA tenderness  Lungs:   Clear to auscultation bilaterally without wheezes, rales or ronchi; respirations unlabored    Chest Wall:   No deformity. There is tenderness at  CC junction on the R side, at inferior breast, and tenderness extends along the rib.  Heart:   Regular rate and rhythm, S1 and S2 normal, no murmur, rub or gallop    Breast Exam:   R breast--s/p surgery with scarring and some skin changes related to previous radiation (telangiectasias) above the nipple. No focal mass. No nipple discharge or inversion. Tenderness at the R medial breast/ribs per chest exam. L breast exam is entirely normal. No axillary lymphadenopathy.  Abdomen:   Soft, nondistended, normoactive bowel sounds, no masses, no hepatosplenomegaly    Genitalia:   Normal external genitalia without lesions. Mild atrophic changes are noted, no erythema. BUS and vagina normal; Bimanual exam revealed surgically absent uterus. No adnexal masses.  Rectal:   Normal tone, no masses or tenderness; guaiac negative stool   Extremities:   No clubbing, cyanosis or edema. No lesions. Normal diabetic foot exam. WHSS B knees.   Pulses:   2+ and symmetric all extremities    Skin:   Skin color, texture, turgor normal, no rashes or lesions. There is a discolored lesion below R breast--appears to be a fairly flat SK. Uniform in color.  Only slight hyperpigmentation below R breast (in skin fold), no rash  Lymph nodes:   Cervical, supraclavicular, inguinal and axillary nodes normal    Neurologic:   CNII-XII intact, normal strength, sensation and gait; reflexes 1+ and symmetric throughout                          Psych:   Normal mood, affect, hygiene and grooming  Normal diabetic foot exam  Lab Results  Component Value Date   HGBA1C 7.1 (A) 03/18/2023      05/29/2023    4:37 PM 06/18/2022    1:33 PM 06/12/2021    2:07 PM 06/09/2020    1:46 PM 05/18/2019    2:10 PM  Depression screen PHQ 2/9  Decreased Interest 0 0 1 0 0  Down, Depressed, Hopeless 0 0 0 0 0  PHQ - 2 Score 0 0 1 0 0  Altered sleeping  0 0 2 0  Tired, decreased energy  0 1 0 0  Change in appetite  1 0 0 0  Feeling bad or failure about  yourself   0 0 0 1  Trouble concentrating  0 0 0 0  Moving slowly or fidgety/restless  0 0 0 0  Suicidal thoughts  0 0 0 0  PHQ-9 Score  1 2 2 1   Difficult doing work/chores  Not difficult at all Not difficult at all Not difficult at all      ASSESSMENT/PLAN:   Needs PHQ-9 (on treatment)  Ensure mammo scheduled (due now) Please order DEXA for 12/2023  Reminded DM eye exam due in March  DIABETIC FOOT EXAM TODAY ***  Discussed monthly self breast exams and yearly mammograms; at least 30 minutes of aerobic activity at least 5 days/week, weight-bearing exercise at least 2x/wk; proper  sunscreen use reviewed; healthy diet, including goals of calcium and vitamin D intake and alcohol recommendations (less than or equal to 1 drink/day) reviewed; regular seatbelt use; changing batteries in smoke detectors. Immunization recommendations discussed--UTD; continue yearly flu shots.  Colonoscopy recommendations reviewed, UTD.  Due again 07/2025. DEXA due again 12/2023

## 2023-06-19 ENCOUNTER — Ambulatory Visit: Payer: 59 | Admitting: Family Medicine

## 2023-06-19 ENCOUNTER — Encounter: Payer: Self-pay | Admitting: Family Medicine

## 2023-06-19 ENCOUNTER — Encounter: Payer: 59 | Admitting: Physical Therapy

## 2023-06-19 VITALS — BP 104/60 | HR 80 | Ht <= 58 in | Wt 140.0 lb

## 2023-06-19 DIAGNOSIS — E785 Hyperlipidemia, unspecified: Secondary | ICD-10-CM

## 2023-06-19 DIAGNOSIS — I152 Hypertension secondary to endocrine disorders: Secondary | ICD-10-CM | POA: Diagnosis not present

## 2023-06-19 DIAGNOSIS — M810 Age-related osteoporosis without current pathological fracture: Secondary | ICD-10-CM

## 2023-06-19 DIAGNOSIS — E1159 Type 2 diabetes mellitus with other circulatory complications: Secondary | ICD-10-CM

## 2023-06-19 DIAGNOSIS — F325 Major depressive disorder, single episode, in full remission: Secondary | ICD-10-CM | POA: Diagnosis not present

## 2023-06-19 DIAGNOSIS — Z5181 Encounter for therapeutic drug level monitoring: Secondary | ICD-10-CM | POA: Diagnosis not present

## 2023-06-19 DIAGNOSIS — Z0000 Encounter for general adult medical examination without abnormal findings: Secondary | ICD-10-CM | POA: Diagnosis not present

## 2023-06-19 DIAGNOSIS — E1169 Type 2 diabetes mellitus with other specified complication: Secondary | ICD-10-CM | POA: Diagnosis not present

## 2023-06-19 DIAGNOSIS — G4700 Insomnia, unspecified: Secondary | ICD-10-CM | POA: Diagnosis not present

## 2023-06-19 DIAGNOSIS — G4733 Obstructive sleep apnea (adult) (pediatric): Secondary | ICD-10-CM

## 2023-06-19 LAB — POCT GLYCOSYLATED HEMOGLOBIN (HGB A1C): Hemoglobin A1C: 5.7 % — AB (ref 4.0–5.6)

## 2023-06-19 MED ORDER — ALENDRONATE SODIUM 70 MG PO TABS: ORAL_TABLET | ORAL | 1 refills | Status: DC

## 2023-06-19 MED ORDER — TRULICITY 3 MG/0.5ML ~~LOC~~ SOAJ: 3.0000 mg | SUBCUTANEOUS | 5 refills | Status: DC

## 2023-06-20 ENCOUNTER — Encounter: Payer: Self-pay | Admitting: Family Medicine

## 2023-06-20 ENCOUNTER — Other Ambulatory Visit: Payer: 59

## 2023-06-20 DIAGNOSIS — Z0000 Encounter for general adult medical examination without abnormal findings: Secondary | ICD-10-CM

## 2023-06-20 DIAGNOSIS — E1169 Type 2 diabetes mellitus with other specified complication: Secondary | ICD-10-CM | POA: Diagnosis not present

## 2023-06-20 DIAGNOSIS — Z5181 Encounter for therapeutic drug level monitoring: Secondary | ICD-10-CM | POA: Diagnosis not present

## 2023-06-20 DIAGNOSIS — E1159 Type 2 diabetes mellitus with other circulatory complications: Secondary | ICD-10-CM | POA: Diagnosis not present

## 2023-06-20 DIAGNOSIS — I152 Hypertension secondary to endocrine disorders: Secondary | ICD-10-CM | POA: Diagnosis not present

## 2023-06-20 DIAGNOSIS — E785 Hyperlipidemia, unspecified: Secondary | ICD-10-CM | POA: Diagnosis not present

## 2023-06-20 LAB — MICROALBUMIN / CREATININE URINE RATIO
Creatinine, Urine: 42.6 mg/dL
Microalb/Creat Ratio: 7 mg/g{creat} (ref 0–29)
Microalbumin, Urine: 3.1 ug/mL

## 2023-06-21 ENCOUNTER — Encounter: Payer: Self-pay | Admitting: Family Medicine

## 2023-06-21 LAB — CBC WITH DIFFERENTIAL/PLATELET
Basophils Absolute: 0.1 10*3/uL (ref 0.0–0.2)
Basos: 1 %
EOS (ABSOLUTE): 0.2 10*3/uL (ref 0.0–0.4)
Eos: 3 %
Hematocrit: 37.5 % (ref 34.0–46.6)
Hemoglobin: 12.5 g/dL (ref 11.1–15.9)
Immature Grans (Abs): 0 10*3/uL (ref 0.0–0.1)
Immature Granulocytes: 0 %
Lymphocytes Absolute: 2.7 10*3/uL (ref 0.7–3.1)
Lymphs: 38 %
MCH: 29.9 pg (ref 26.6–33.0)
MCHC: 33.3 g/dL (ref 31.5–35.7)
MCV: 90 fL (ref 79–97)
Monocytes Absolute: 0.5 10*3/uL (ref 0.1–0.9)
Monocytes: 7 %
Neutrophils Absolute: 3.6 10*3/uL (ref 1.4–7.0)
Neutrophils: 51 %
Platelets: 229 10*3/uL (ref 150–450)
RBC: 4.18 x10E6/uL (ref 3.77–5.28)
RDW: 12.9 % (ref 11.7–15.4)
WBC: 7.1 10*3/uL (ref 3.4–10.8)

## 2023-06-21 LAB — CMP14+EGFR
ALT: 38 [IU]/L — ABNORMAL HIGH (ref 0–32)
AST: 27 [IU]/L (ref 0–40)
Albumin: 4.7 g/dL (ref 3.9–4.9)
Alkaline Phosphatase: 53 [IU]/L (ref 44–121)
BUN/Creatinine Ratio: 29 — ABNORMAL HIGH (ref 12–28)
BUN: 19 mg/dL (ref 8–27)
Bilirubin Total: 0.2 mg/dL (ref 0.0–1.2)
CO2: 17 mmol/L — ABNORMAL LOW (ref 20–29)
Calcium: 10.3 mg/dL (ref 8.7–10.3)
Chloride: 103 mmol/L (ref 96–106)
Creatinine, Ser: 0.66 mg/dL (ref 0.57–1.00)
Globulin, Total: 2.3 g/dL (ref 1.5–4.5)
Glucose: 131 mg/dL — ABNORMAL HIGH (ref 70–99)
Potassium: 4.9 mmol/L (ref 3.5–5.2)
Sodium: 139 mmol/L (ref 134–144)
Total Protein: 7 g/dL (ref 6.0–8.5)
eGFR: 98 mL/min/{1.73_m2} (ref >59)

## 2023-06-21 LAB — LIPID PANEL
Chol/HDL Ratio: 3.5 {ratio} (ref 0.0–4.4)
Cholesterol, Total: 137 mg/dL (ref 100–199)
HDL: 39 mg/dL — ABNORMAL LOW (ref >39)
LDL Chol Calc (NIH): 71 mg/dL (ref 0–99)
Triglycerides: 154 mg/dL — ABNORMAL HIGH (ref 0–149)
VLDL Cholesterol Cal: 27 mg/dL (ref 5–40)

## 2023-06-21 LAB — TSH: TSH: 1.24 u[IU]/mL (ref 0.450–4.500)

## 2023-06-24 ENCOUNTER — Other Ambulatory Visit: Payer: Self-pay | Admitting: Family Medicine

## 2023-06-24 DIAGNOSIS — Z1231 Encounter for screening mammogram for malignant neoplasm of breast: Secondary | ICD-10-CM

## 2023-07-01 ENCOUNTER — Ambulatory Visit
Admission: RE | Admit: 2023-07-01 | Discharge: 2023-07-01 | Disposition: A | Discharge status: Home / Self Care | Payer: 59 | Source: Ambulatory Visit | Attending: Family Medicine | Admitting: Family Medicine

## 2023-07-01 DIAGNOSIS — Z1231 Encounter for screening mammogram for malignant neoplasm of breast: Secondary | ICD-10-CM

## 2023-07-15 MED ORDER — ZOLPIDEM TARTRATE ER 6.25 MG PO TBCR: 6.2500 mg | EXTENDED_RELEASE_TABLET | Freq: Every day | ORAL | 2 refills | Status: DC

## 2023-07-15 NOTE — Addendum Note (Signed)
 Addended by: Joselyn Arrow on: 07/15/2023 04:34 PM   Modules accepted: Orders

## 2023-08-01 DIAGNOSIS — H02885 Meibomian gland dysfunction left lower eyelid: Secondary | ICD-10-CM | POA: Diagnosis not present

## 2023-08-01 DIAGNOSIS — H16223 Keratoconjunctivitis sicca, not specified as Sjogren's, bilateral: Secondary | ICD-10-CM | POA: Diagnosis not present

## 2023-08-01 DIAGNOSIS — H02882 Meibomian gland dysfunction right lower eyelid: Secondary | ICD-10-CM | POA: Diagnosis not present

## 2023-08-04 DIAGNOSIS — G4733 Obstructive sleep apnea (adult) (pediatric): Secondary | ICD-10-CM | POA: Diagnosis not present

## 2023-10-08 ENCOUNTER — Encounter: Payer: Self-pay | Admitting: Family Medicine

## 2023-10-08 DIAGNOSIS — G4700 Insomnia, unspecified: Secondary | ICD-10-CM

## 2023-10-09 MED ORDER — ZOLPIDEM TARTRATE ER 6.25 MG PO TBCR: 6.2500 mg | EXTENDED_RELEASE_TABLET | Freq: Every day | ORAL | 0 refills | Status: DC

## 2023-10-21 ENCOUNTER — Other Ambulatory Visit: Payer: Self-pay | Admitting: *Deleted

## 2023-10-21 ENCOUNTER — Encounter: Payer: Self-pay | Admitting: Family Medicine

## 2023-10-21 DIAGNOSIS — E1169 Type 2 diabetes mellitus with other specified complication: Secondary | ICD-10-CM

## 2023-10-21 MED ORDER — METFORMIN HCL ER 500 MG PO TB24: 1000.0000 mg | ORAL_TABLET | Freq: Every day | ORAL | 0 refills | Status: DC

## 2023-11-11 ENCOUNTER — Encounter: Payer: Self-pay | Admitting: Family Medicine

## 2023-11-11 DIAGNOSIS — G4700 Insomnia, unspecified: Secondary | ICD-10-CM

## 2023-11-11 MED ORDER — ZOLPIDEM TARTRATE ER 6.25 MG PO TBCR: 6.2500 mg | EXTENDED_RELEASE_TABLET | Freq: Every day | ORAL | 0 refills | Status: DC

## 2023-11-13 LAB — HM DIABETES EYE EXAM

## 2023-11-21 ENCOUNTER — Encounter: Payer: Self-pay | Admitting: *Deleted

## 2023-12-09 ENCOUNTER — Encounter: Payer: Self-pay | Admitting: Family Medicine

## 2023-12-09 DIAGNOSIS — G4700 Insomnia, unspecified: Secondary | ICD-10-CM

## 2023-12-09 MED ORDER — ZOLPIDEM TARTRATE ER 6.25 MG PO TBCR: 6.2500 mg | EXTENDED_RELEASE_TABLET | Freq: Every day | ORAL | 0 refills | Status: DC

## 2023-12-11 ENCOUNTER — Other Ambulatory Visit: Payer: Self-pay | Admitting: Family Medicine

## 2023-12-11 DIAGNOSIS — E1169 Type 2 diabetes mellitus with other specified complication: Secondary | ICD-10-CM

## 2023-12-12 ENCOUNTER — Other Ambulatory Visit: Payer: Self-pay | Admitting: Family Medicine

## 2023-12-12 DIAGNOSIS — E1169 Type 2 diabetes mellitus with other specified complication: Secondary | ICD-10-CM

## 2023-12-12 MED ORDER — TRULICITY 3 MG/0.5ML ~~LOC~~ SOAJ: 3.0000 mg | SUBCUTANEOUS | 0 refills | Status: DC

## 2023-12-12 NOTE — Telephone Encounter (Signed)
 Left message asking patient if she needs this prior to appt on 12/16/23.

## 2023-12-12 NOTE — Telephone Encounter (Signed)
 Copied from CRM [id number]. Topic: Clinical - Medication Refill >> Dec 12, 2023  9:08 AM Carlatta H wrote: Medication: Dulaglutide  (TRULICITY ) 3 MG/0.5ML SOAJ  Has the patient contacted their pharmacy? No (Agent: If no, request that the patient contact the pharmacy for the refill. If patient does not wish to contact the pharmacy document the reason why and proceed with request.) (Agent: If yes, when and what did the pharmacy advise?)  This is the patient's preferred pharmacy:    [id number] - [place], [place] - [address] [place] [address] Phone: [phone] Fax: [phone]  Is this the correct pharmacy for this prescription? Yes If no, delete pharmacy and type the correct one.   Has the prescription been filled recently? No  Is the patient out of the medication? Yes  Has the patient been seen for an appointment in the last year OR does the patient have an upcoming appointment? Yes  Can we respond through MyChart? Yes  Agent: Please be advised that Rx refills may take up to 3 business days. We ask that you follow-up with your pharmacy.

## 2023-12-14 ENCOUNTER — Other Ambulatory Visit: Payer: Self-pay | Admitting: Family Medicine

## 2023-12-14 DIAGNOSIS — I10 Essential (primary) hypertension: Secondary | ICD-10-CM

## 2023-12-14 DIAGNOSIS — E7800 Pure hypercholesterolemia, unspecified: Secondary | ICD-10-CM

## 2023-12-14 DIAGNOSIS — M810 Age-related osteoporosis without current pathological fracture: Secondary | ICD-10-CM

## 2023-12-14 DIAGNOSIS — F325 Major depressive disorder, single episode, in full remission: Secondary | ICD-10-CM

## 2023-12-14 NOTE — Progress Notes (Unsigned)
 No chief complaint on file.  Patient presents for 6 month follow-up on chronic problems.  Diabetes follow-up:  She reports compliance with Trulicity  3 mg weekly and Metformin  ER 1000 mg daily, and denies side effects. Last A1c was 5.7% in 05/2023 on this regimen.  A1c had been up to 7.1 (on same regimen) in 02/2023, when diet wasn't as good (more carbs--break, pasta, bagel daily, licorice for snacks).  She had seen dietician and made improvements in diet,  She continues to limit bread (and whole wheat when she has it), has yogurt, protein drink or a wrap for breakfast, no longer having bagels regularly. She mostly drinks water .   Sugars are running ***  Exercise ***   She denies hypoglycemia, polydipsia, polyuria.  Last diabetic eye exam was 10/2023.  She checks her feet regularly and denies concerns.  Component Ref Range & Units (hover) 5 mo ago (06/19/23) 9 mo ago (03/18/23) 1 yr ago (08/22/22) 1 yr ago (06/15/22) 1 yr ago (12/20/21) 2 yr ago (08/23/21) 2 yr ago (06/08/21)  Hemoglobin A1C 5.7 Abnormal  7.1 Abnormal  6.7 Abnormal  7.2 High  R, CM 6.0 Abnormal  6.7 Abnormal  6.5 High  R, CM     Hypertension: BP's are checked occasionally at home, running *** Reports compliance with lisinopril  10 mg daily.  Denies cough, SE. She denies dizziness, headaches, chest pain, shortness of breath, edema.    BP Readings from Last 3 Encounters:  06/19/23 104/60  05/07/23 102/70  03/26/23 102/76     Depression: Paxil  dose was lowered from 40mg  to 20mg  in 10/2019, and did well at the 20mg  dose. She tried decreasing further to 10mg  after physical in 05/2021, but she had weird dreams, didn't feel like herself.   She continues to do well on the 20mg  dose, and prefers to continue at this dose. Her moods remain good on this dose, denies side effects.   Hyperlipidemia follow-up:  Compliant with medications (simvastatin  and fish oil) and denies medication side effects.  She continues to try and follow a  lowfat diet She continues to mostly eat baked chicken, occasional pork or beef. +mayo on sandwiches. TG just mildly elevated on last check in January.   Lab Results  Component Value Date   CHOL 137 06/20/2023   HDL 39 (L) 06/20/2023   LDLCALC 71 06/20/2023   TRIG 154 (H) 06/20/2023   CHOLHDL 3.5 06/20/2023     Insomnia: requires Ambien  CR nightly.  Her dose was decreased to the 6.25mg  dose (from 12.5) at her January 2024 visit, based on concerns mentioned by her husband (staggering around the house, walking around clueless, was concerned about falls).  She is sleeping well on the lower dose, and is less sedated.   OSA--doing well on CPAP. Compliant with daily use. She denies morning headaches, unrefreshed sleep, daytime somnolence, or any discomfort/problems related to the CPAP machine.   Osteoporosis: She had been changed from fosamax  to Prolia  in 05/2017 when DEXA showed T-2.7 at spine. F/u DEXA showed improvement (T-2.0 at spine in 10/2019) but ultimately Prolia  was stopped (last injection 04/2020) related to insurance issues. She went back on alendronate . Last DEXA was 12/2021, T-2.2 at spine.  She preferred to stay on alendronate  (despite slight drop) and recheck in 2 years. She is due for f/u DEXA in August.  She reports compliance with taking alendronate  on Fridays. She denies any dysphagia or chest pain. She continues to take Calcium, D,  Weight-bearing exercise--minimal (just laundry, groceries). Has  some weights at home, hasn't been using.  ***UPDATE    PMH, PSH, SH reviewed   ROS: no f/c, no URI or allergy symptoms,  no cough, shortness of breath, wheezing, no chest pain, palpitations, edema, headaches or dizziness. No n/v/d/heartburn. Moods are good. Refreshed with sleep.  Hip pain?? R?    PHYSICAL EXAM:  There were no vitals taken for this visit.  Wt Readings from Last 3 Encounters:  06/19/23 140 lb (63.5 kg)  05/07/23 141 lb (64 kg)  03/26/23 141 lb (64 kg)     Pleasant, well-appearing female, in good spirits HEENT: conjunctiva and sclera are clear, EOMI Neck: no lymphadenopathy, thyromegaly or mass Heart: regular rate and rhythm Lungs: clear bilaterally Back: no spinal or CVA tenderness Abdomen: soft, nontender Extremities:  no edema. Normal pulses.  Psych: normal mood, affect, hygiene and grooming Neuro: alert and oriented, cranial nerves grossly intact. Normal gait  ***UPDATE--troch bursa tenderness??   ASSESSMENT/PLAN:   A1c only   DEXA was due in August--does she have scheduled anywhere?  Had been ordered, but likely for [facility] and needs to be changed. Will it need to be re-ordered??  You [facility] [place]  [facility]--were they there then to pick it up?? Looks like it was done twice??   F/u 6 mos for CPE--will that be Welcome to Medicare??  Flu (now high dose) and COVID booster in the Fall

## 2023-12-16 ENCOUNTER — Ambulatory Visit: Payer: 59 | Admitting: Family Medicine

## 2023-12-16 ENCOUNTER — Telehealth: Payer: Self-pay | Admitting: Family Medicine

## 2023-12-16 ENCOUNTER — Encounter: Payer: Self-pay | Admitting: Family Medicine

## 2023-12-16 VITALS — BP 108/68 | HR 76 | Ht <= 58 in | Wt 141.6 lb

## 2023-12-16 DIAGNOSIS — L989 Disorder of the skin and subcutaneous tissue, unspecified: Secondary | ICD-10-CM

## 2023-12-16 DIAGNOSIS — E1159 Type 2 diabetes mellitus with other circulatory complications: Secondary | ICD-10-CM | POA: Diagnosis not present

## 2023-12-16 DIAGNOSIS — E7800 Pure hypercholesterolemia, unspecified: Secondary | ICD-10-CM

## 2023-12-16 DIAGNOSIS — E66811 Other obesity due to excess calories: Secondary | ICD-10-CM

## 2023-12-16 DIAGNOSIS — M810 Age-related osteoporosis without current pathological fracture: Secondary | ICD-10-CM

## 2023-12-16 DIAGNOSIS — I10 Essential (primary) hypertension: Secondary | ICD-10-CM

## 2023-12-16 DIAGNOSIS — F325 Major depressive disorder, single episode, in full remission: Secondary | ICD-10-CM

## 2023-12-16 DIAGNOSIS — E785 Hyperlipidemia, unspecified: Secondary | ICD-10-CM

## 2023-12-16 DIAGNOSIS — G4733 Obstructive sleep apnea (adult) (pediatric): Secondary | ICD-10-CM

## 2023-12-16 DIAGNOSIS — E1169 Type 2 diabetes mellitus with other specified complication: Secondary | ICD-10-CM

## 2023-12-16 DIAGNOSIS — I152 Hypertension secondary to endocrine disorders: Secondary | ICD-10-CM

## 2023-12-16 DIAGNOSIS — Z6831 Body mass index (BMI) 31.0-31.9, adult: Secondary | ICD-10-CM

## 2023-12-16 DIAGNOSIS — G4700 Insomnia, unspecified: Secondary | ICD-10-CM

## 2023-12-16 DIAGNOSIS — E6609 Other obesity due to excess calories: Secondary | ICD-10-CM

## 2023-12-16 DIAGNOSIS — Z5181 Encounter for therapeutic drug level monitoring: Secondary | ICD-10-CM

## 2023-12-16 LAB — POCT GLYCOSYLATED HEMOGLOBIN (HGB A1C): Hemoglobin A1C: 6.7 % — AB (ref 4.0–5.6)

## 2023-12-16 MED ORDER — ALENDRONATE SODIUM 70 MG PO TABS: ORAL_TABLET | ORAL | 1 refills | Status: DC

## 2023-12-16 MED ORDER — PAROXETINE HCL 20 MG PO TABS: 20.0000 mg | ORAL_TABLET | Freq: Every day | ORAL | 1 refills | Status: AC

## 2023-12-16 MED ORDER — SIMVASTATIN 20 MG PO TABS: ORAL_TABLET | ORAL | 1 refills | Status: AC

## 2023-12-16 MED ORDER — LISINOPRIL 10 MG PO TABS: 10.0000 mg | ORAL_TABLET | Freq: Every day | ORAL | 1 refills | Status: AC

## 2023-12-16 NOTE — Addendum Note (Signed)
 Addended by: Martasia Talamante on: 12/16/2023 08:43 PM   Modules accepted: Orders

## 2023-12-16 NOTE — Patient Instructions (Addendum)
 Try and limit your carbs (breads in general--wraps, english muffins, bagels, bread) and use whole grain versions, when possible, and limit portions. Your sugars were better after you met with the nutritionist, and had been eating yogurt, protein drink or a wrap for breakfast (in place of the bagels).  Please check your sugars periodically--this help you learn which foods cause your sugars to be higher, and to try and limit those foods (or limit the portions of those foods).  You should try and limit your cheese to see if this helps with your diarrhea. If so, you can try taking Lactaid tablets prior to having any dairy. You can also add metamucil (fiber can help bulk up the stools to make them less loose).  Try and get weight-bearing exercise at least 2x/week.  Schedule your follow-up bone density test. If that shows further decline in your bone density, we may check to see if the Prolia  is affordable since you will be going onto Medicare.  Resume your home exercise program for your hip bursitis that you got from the physical therapist. If that isn't helping, you can see sports medicine (or me) for another injection.  Please get the high dose flu shot and the updated COVID booster when it becomes available in he Fall.  Try some hydrocortisone 1% cream 2-3 times daily if needed to the bumps on your L ring finger.  Use the cream only if needed for itching.

## 2023-12-16 NOTE — Telephone Encounter (Signed)
 Pt scheduled WTM March  in the afternoon, ? Ok to schedule fasting labs prior

## 2024-01-06 MED ORDER — ZOLPIDEM TARTRATE ER 6.25 MG PO TBCR: 6.2500 mg | EXTENDED_RELEASE_TABLET | Freq: Every day | ORAL | 0 refills | Status: DC

## 2024-01-06 NOTE — Addendum Note (Signed)
 Addended by: Mikael Skoda on: 01/06/2024 06:12 PM   Modules accepted: Orders

## 2024-01-12 ENCOUNTER — Other Ambulatory Visit: Payer: Self-pay | Admitting: Family Medicine

## 2024-01-12 DIAGNOSIS — E1169 Type 2 diabetes mellitus with other specified complication: Secondary | ICD-10-CM

## 2024-01-17 ENCOUNTER — Encounter: Payer: Self-pay | Admitting: Family Medicine

## 2024-01-21 ENCOUNTER — Other Ambulatory Visit: Payer: Self-pay | Admitting: *Deleted

## 2024-01-21 DIAGNOSIS — E1169 Type 2 diabetes mellitus with other specified complication: Secondary | ICD-10-CM

## 2024-01-21 MED ORDER — TRULICITY 3 MG/0.5ML ~~LOC~~ SOAJ: 3.0000 mg | SUBCUTANEOUS | 1 refills | Status: AC

## 2024-02-03 ENCOUNTER — Encounter: Payer: Self-pay | Admitting: Family Medicine

## 2024-02-03 DIAGNOSIS — G4700 Insomnia, unspecified: Secondary | ICD-10-CM

## 2024-02-04 MED ORDER — ZOLPIDEM TARTRATE ER 6.25 MG PO TBCR
6.2500 mg | EXTENDED_RELEASE_TABLET | Freq: Every day | ORAL | 0 refills | Status: DC
Start: 2024-02-04 — End: 2024-03-09

## 2024-02-13 ENCOUNTER — Other Ambulatory Visit: Payer: 59

## 2024-02-14 ENCOUNTER — Telehealth: Payer: Self-pay | Admitting: Family Medicine

## 2024-02-14 NOTE — Telephone Encounter (Signed)
 Received letter from [facility] stating Zolpidem  ER not on formulary.  Called [facility] t# [phone], formulary alternatives are Belsomra, Eszopiclone & Temazepam (requires P.A).  None of the Zolpidems are covered.  They stated after she tried all the formulary alternatives then we could send in a formulary exception to try and get Zolpidem  approved.  Also checked [facility] and she can [facility] or [facility]18 at [facility]

## 2024-02-17 NOTE — Telephone Encounter (Signed)
 Called pt & informed, she does want to stay with current medication & use GoodRx, I texted her discount card info

## 2024-02-24 LAB — HM DEXA SCAN

## 2024-02-24 LAB — HM MAMMOGRAPHY

## 2024-02-26 ENCOUNTER — Encounter: Payer: Self-pay | Admitting: *Deleted

## 2024-02-27 ENCOUNTER — Ambulatory Visit: Payer: Self-pay | Admitting: Family Medicine

## 2024-03-05 ENCOUNTER — Encounter: Payer: Self-pay | Admitting: Family Medicine

## 2024-03-09 ENCOUNTER — Ambulatory Visit: Admitting: Family Medicine

## 2024-03-09 ENCOUNTER — Other Ambulatory Visit: Payer: Self-pay

## 2024-03-09 ENCOUNTER — Encounter: Payer: Self-pay | Admitting: Family Medicine

## 2024-03-09 VITALS — BP 102/68 | HR 76 | Ht <= 58 in | Wt 141.0 lb

## 2024-03-09 DIAGNOSIS — M25551 Pain in right hip: Secondary | ICD-10-CM | POA: Diagnosis not present

## 2024-03-09 DIAGNOSIS — G4700 Insomnia, unspecified: Secondary | ICD-10-CM

## 2024-03-09 MED ORDER — ZOLPIDEM TARTRATE ER 6.25 MG PO TBCR: 6.2500 mg | EXTENDED_RELEASE_TABLET | Freq: Every day | ORAL | 2 refills | Status: DC

## 2024-03-09 NOTE — Patient Instructions (Signed)
 Thank you for coming in today.   You received an injection today. Seek immediate medical attention if the joint becomes red, extremely painful, or is oozing fluid.

## 2024-03-09 NOTE — Progress Notes (Signed)
   LILLETTE Ileana Collet, PhD, LAT, ATC acting as a scribe for Artist Lloyd, MD.  Julie Fischer is a 63 y.o. female who presents to [facility] today for exacerbation of her  hip pain. Pt was last seen by Dr. Lloyd on 05/07/23 and was advised to cont HEP. Last R GT steroid injection, 03/26/23.  Today, pt reports R hip pain returned around March. Pain is still located along the lateral aspect of her R hip. No radiating pain.   Dx imaging: 03/26/23 R hip XR  Pertinent review of systems: No fevers or chills  Relevant historical information: Hypertension and osteoporosis   Exam:  BP 102/68   Pulse 76   Ht 4' 8.5 (1.435 m)   Wt 141 lb (64 kg)   SpO2 98%   BMI 31.05 kg/m  General: Well Developed, well nourished, and in no acute distress.   MSK: Right hip tender palpation greater trochanter.  Pain with resisted hip abduction.    Lab and Radiology Results  Procedure: Real-time Ultrasound Guided Injection of right lateral hip greater trochanter bursa Device: Philips Affiniti 50G/GE Logiq Images permanently stored and available for review in PACS Verbal informed consent obtained.  Discussed risks and benefits of procedure. Warned about infection, bleeding, hyperglycemia damage to structures among others. Patient expresses understanding and agreement Time-out conducted.   Noted no overlying erythema, induration, or other signs of local infection.   Skin prepped in a sterile fashion.   Local anesthesia: Topical Ethyl chloride.   With sterile technique and under real time ultrasound guidance: 40 mg of Kenalog  and 2 mL of Marcaine  injected into greater trochanter bursa. Fluid seen entering the bursa.   Completed without difficulty   Pain immediately resolved suggesting accurate placement of the medication.   Advised to call if fevers/chills, erythema, induration, drainage, or persistent bleeding.   Images permanently stored and available for review in the  ultrasound unit.  Impression: Technically successful ultrasound guided injection.         Assessment and Plan: 63 y.o. female with right lateral hip pain due to greater trochanteric bursitis.  Plan for repeat injection today.  Continue home exercise program.   PDMP not reviewed this encounter. Orders Placed This Encounter  Procedures   US  LIMITED JOINT SPACE STRUCTURES LOW RIGHT(NO LINKED CHARGES)    Reason for Exam (SYMPTOM  OR DIAGNOSIS REQUIRED):   right hip pain    Preferred imaging location?:   [facility] [place]   No orders of the defined types were placed in this encounter.    Discussed warning signs or symptoms. Please see discharge instructions. Patient expresses understanding.   The above documentation has been reviewed and is accurate and complete Artist Lloyd, M.D.

## 2024-04-24 ENCOUNTER — Ambulatory Visit: Payer: Self-pay | Admitting: Internal Medicine

## 2024-04-24 ENCOUNTER — Ambulatory Visit: Admitting: Internal Medicine

## 2024-04-24 ENCOUNTER — Encounter: Payer: Self-pay | Admitting: Internal Medicine

## 2024-04-24 ENCOUNTER — Ambulatory Visit: Payer: Self-pay

## 2024-04-24 VITALS — HR 97 | Temp 100.7°F | Ht <= 58 in | Wt 141.0 lb

## 2024-04-24 DIAGNOSIS — J101 Influenza due to other identified influenza virus with other respiratory manifestations: Secondary | ICD-10-CM

## 2024-04-24 DIAGNOSIS — R509 Fever, unspecified: Secondary | ICD-10-CM

## 2024-04-24 LAB — POC COVID19/FLU A&B COMBO
Covid Antigen, POC: NEGATIVE
Influenza A Antigen, POC: POSITIVE — AB
Influenza B Antigen, POC: NEGATIVE

## 2024-04-24 MED ORDER — HYDROCODONE BIT-HOMATROP MBR 5-1.5 MG/5ML PO SOLN: 5.0000 mL | Freq: Three times a day (TID) | ORAL | 0 refills | Status: DC | PRN

## 2024-04-24 MED ORDER — AZITHROMYCIN 250 MG PO TABS: ORAL_TABLET | ORAL | 0 refills | Status: DC

## 2024-04-24 MED ORDER — OSELTAMIVIR PHOSPHATE 75 MG PO CAPS: 75.0000 mg | ORAL_CAPSULE | Freq: Two times a day (BID) | ORAL | 0 refills | Status: DC

## 2024-04-24 NOTE — Progress Notes (Signed)
 Patient Care Team: Randol Dawes, MD as PCP - General (Family Medicine)  Visit Date: 04/24/24  Subjective:    Patient ID: Julie Fischer , Female   DOB: 05-08-1961, 63 y.o.    MRN: [id number]   63 y.o. Female presents today for cough. Patient has a past medical history of Hypertension, Osteopenia, insomnia.  She began to feel ill on Wednesday. She is experiencing a cough, runny nose, sore throat and myalgias. She denies having  shaking chills. No vomiting. In the office she had a temp of 100.7. Influenza test was positive for Influenza A. Her husband is also ill with similar symptoms.  Had flu vaccine 02/26/24.  Past Medical History:  Diagnosis Date   Allergy    fall seasonal   Anxiety    Blood transfusion    Platelet transfusion when on heparin   Breast cancer (HCC) 2001   R breast(lumpectomy,chemo,radiation,tamoxifen)   C. difficile diarrhea 02/2016   treated with Flagyl    Complication of anesthesia    gets Lavone going under she has been told   COVID-19 09/2020   Depression    treated   Diabetes mellitus    type 2   GERD (gastroesophageal reflux disease)    Hyperlipidemia    Hypertension    resolved after weight loss surgery; recurred 2013   Insomnia chronic   Iron deficiency    h/o   Microalbuminuria    h/o   OA (osteoarthritis) of knee    Osteoporosis    Personal history of chemotherapy    Personal history of radiation therapy    Plantar fasciitis (07') DrRegal   s/p surgical release 01/2011   Sleep apnea    resolved after weight loss surgery; recurrent with weight gain; on CPAP   Vitamin D  deficiency      Family History  Problem Relation Age of Onset   Dementia Mother    Stroke Mother 46       due to aneurysm   Macular degeneration Mother    Colon cancer Mother 19   Lung cancer Father    Pancreatic cancer Father    Early death Father    Hyperlipidemia Sister    Graves' disease Sister    Hyperlipidemia Sister    Cancer Sister    Diabetes  Maternal Grandmother    Breast cancer Cousin 36 - 15   Breast cancer Cousin 23 - 59   Hyperlipidemia Brother    Diabetes Brother    Hypertension Brother    Hepatitis C Brother    Alcohol abuse Brother    Drug abuse Brother     Social History   Social History Narrative   Married. Lives with husband, 1 dog (boxer-lab mix, Deidre).   Not currently working.   Husband retired.   Spends summers camping in [place] .      Updated 05/2023      Review of Systems  Constitutional:  Positive for fever and malaise/fatigue. Negative for chills.  HENT:  Positive for congestion and sore throat.   Respiratory:  Positive for cough. Negative for sputum production.         Objective:   Vitals: Pulse 97   Temp (!) 100.7 F (38.2 C)   Ht 4' 8.5 (1.435 m)   Wt 141 lb (64 kg)   SpO2 96%   BMI 31.05 kg/m    Physical Exam Vitals and nursing note reviewed.  Constitutional:      General: She is not in acute distress.  Appearance: Normal appearance. She is not ill-appearing.  HENT:     Head: Normocephalic and atraumatic.     Right Ear: Tympanic membrane, ear canal and external ear normal.     Left Ear: Tympanic membrane, ear canal and external ear normal.     Mouth/Throat:     Mouth: Mucous membranes are moist.     Pharynx: Oropharynx is clear. No oropharyngeal exudate or posterior oropharyngeal erythema.  Pulmonary:     Effort: Pulmonary effort is normal.     Breath sounds: Normal breath sounds. No wheezing, rhonchi or rales.  Lymphadenopathy:     Cervical: No cervical adenopathy.  Skin:    General: Skin is warm and dry.  Neurological:     Mental Status: She is alert and oriented to person, place, and time. Mental status is at baseline.  Psychiatric:        Mood and Affect: Mood normal.        Behavior: Behavior normal.        Thought Content: Thought content normal.        Judgment: Judgment normal.       Results:    Labs:       Component Value Date/Time   NA  139 06/20/2023 0000   K 4.9 06/20/2023 0000   CL 103 06/20/2023 0000   CO2 17 (L) 06/20/2023 0000   GLUCOSE 131 (H) 06/20/2023 0000   GLUCOSE 133 (H) 01/08/2018 0526   BUN 19 06/20/2023 0000   CREATININE 0.66 06/20/2023 0000   CREATININE 0.55 03/18/2017 1006   CALCIUM 10.3 06/20/2023 0000   PROT 7.0 06/20/2023 0000   ALBUMIN 4.7 06/20/2023 0000   AST 27 06/20/2023 0000   ALT 38 (H) 06/20/2023 0000   ALKPHOS 53 06/20/2023 0000   BILITOT 0.2 06/20/2023 0000   GFRNONAA 98 07/05/2020 0829   GFRAA 113 07/05/2020 0829     Lab Results  Component Value Date   WBC 7.1 06/20/2023   HGB 12.5 06/20/2023   HCT 37.5 06/20/2023   MCV 90 06/20/2023   PLT 229 06/20/2023    Lab Results  Component Value Date   CHOL 137 06/20/2023   HDL 39 (L) 06/20/2023   LDLCALC 71 06/20/2023   TRIG 154 (H) 06/20/2023   CHOLHDL 3.5 06/20/2023    Lab Results  Component Value Date   HGBA1C 6.7 (A) 12/16/2023     Lab Results  Component Value Date   TSH 1.240 06/20/2023        Assessment & Plan:   Meds ordered this encounter  Medications   DISCONTD: azithromycin  (ZITHROMAX ) 250 MG tablet    Sig: Take 2 tablets on day 1, then 1 tablet daily on days 2 through 5    Dispense:  6 tablet    Refill:  0   HYDROcodone  bit-homatropine (HYCODAN) 5-1.5 MG/5ML syrup    Sig: Take 5 mLs by mouth every 8 (eight) hours as needed for cough.    Dispense:  120 mL    Refill:  0   oseltamivir  (TAMIFLU ) 75 MG capsule    Sig: Take 1 capsule (75 mg total) by mouth 2 (two) times daily.    Dispense:  10 capsule    Refill:  0   Orders Placed This Encounter  Procedures   POC Covid19/Flu A&B Antigen    Influenza A: She began to feel ill on Wednesday. She is experiencing a cough, runny nose, sore throat and body aches. She denies having any shaking chills. In  the office she had a fever of 100.7. Influenza test was positive for Influenza A. Her husband is also ill. She had flu vaccine 02/26/2024.  Hycodan 5 mL every  8 hours as needed for cough prescribed.   Tamiflu  75 mg twice daily for 5 days prescribed.             Quarantine at home at least 48 hours. Rest and stay well hydrated. Call Dr. Randol if not improving in 2 days or sooner if worse.    I,Makayla C Reid,acting as a scribe for Ronal JINNY Hailstone, MD.,have documented all relevant documentation on the behalf of Ronal JINNY Hailstone, MD,as directed by  Ronal JINNY Hailstone, MD while in the presence of Ronal JINNY Hailstone, MD.  I, Ronal JINNY Hailstone, MD, have reviewed all documentation for this visit. The documentation on 04/24/2024 for the exam, diagnosis, procedures, and orders are all accurate and complete.

## 2024-04-24 NOTE — Patient Instructions (Addendum)
 You have been diagnosed with Influenza A.  Please quarantine at home for at least 48 hours.  Tamiflu  75 mg twice daily by mouth for 5 days prescribed.  May take Hycodan 1 teaspoon every 8 hours as needed for cough.  May take Tylenol  for fever.  Rest and stay well-hydrated.  Call Dr. Randol if not improving in 48 hours or sooner if worse.

## 2024-04-24 NOTE — Telephone Encounter (Signed)
 FYI Only or Action Required?: FYI only for provider: appointment scheduled on 04/24/24.  Patient was last seen in primary care on 12/16/2023 by Randol Dawes, MD.  Called Nurse Triage reporting Shortness of Breath and Cough.  Symptoms began several days ago.  Interventions attempted: OTC medications: Nyquil, Dayquil, tylenol . vicks.  Symptoms are: gradually worsening.  Triage Disposition: See HCP Within 4 Hours (Or PCP Triage)  Patient/caregiver understands and will follow disposition?: Yes            Copied from CRM [id number]. Topic: Clinical - Red Word Triage >> Apr 24, 2024 12:58 PM Hadassah PARAS wrote: Red Word that prompted transfer to Nurse Triage: Terrible cold, bad cough that ribs hurt, muscular pain from coughing so muchsinus, fever (not sure #), sinus, irriated nose from blowing so much, hard to breathe Reason for Disposition  [1] MILD difficulty breathing (e.g., minimal/no SOB at rest, SOB with walking, pulse < 100) AND [2] NEW-onset or WORSE than normal  Answer Assessment - Initial Assessment Questions 1. RESPIRATORY STATUS: Describe your breathing? (e.g., wheezing, shortness of breath, unable to speak, severe coughing)      SOB, dry hacking cough. Patient able to speak in full sentences, no wheezing. She states her muscles and ribs hurt from coughing.  2. ONSET: When did this breathing problem begin?      2 days ago, Wednesday.  3. PATTERN Does the difficult breathing come and go, or has it been constant since it started?      Constant.  4. SEVERITY: How bad is your breathing? (e.g., mild, moderate, severe)      Mild.  5. RECURRENT SYMPTOM: Have you had difficulty breathing before? If Yes, ask: When was the last time? and What happened that time?      Yes, she states she is sure she has felt this way before from a cold.  6. CARDIAC HISTORY: Do you have any history of heart disease? (e.g., heart attack, angina, bypass surgery, angioplasty)       No.  7. LUNG HISTORY: Do you have any history of lung disease?  (e.g., pulmonary embolus, asthma, emphysema)     No.  8. CAUSE: What do you think is causing the breathing problem?      Cold/URI.  9. OTHER SYMPTOMS: Do you have any other symptoms? (e.g., chest pain, cough, dizziness, fever, runny nose)     Sore throat, sinus/nasal congestion and discharge (clear). No loss of taste or smell, nausea, vomiting, diarrhea, chest pain.  10. O2 SATURATION MONITOR:  Do you use an oxygen saturation monitor (pulse oximeter) at home? If Yes, ask: What is your reading (oxygen level) today? What is your usual oxygen saturation reading? (e.g., 95%)       No.  11. PREGNANCY: Is there any chance you are pregnant? When was your last menstrual period?       N/a.  12. TRAVEL: Have you traveled out of the country in the last month? (e.g., travel history, exposures)       No. Patient has not checked home flu or COVID test.  Protocols used: Breathing Difficulty-A-AH

## 2024-05-03 ENCOUNTER — Other Ambulatory Visit: Payer: Self-pay | Admitting: *Deleted

## 2024-05-03 ENCOUNTER — Encounter: Payer: Self-pay | Admitting: Family Medicine

## 2024-05-03 DIAGNOSIS — E1169 Type 2 diabetes mellitus with other specified complication: Secondary | ICD-10-CM

## 2024-05-03 MED ORDER — METFORMIN HCL ER 500 MG PO TB24: 1000.0000 mg | ORAL_TABLET | Freq: Every day | ORAL | 0 refills | Status: AC

## 2024-05-24 ENCOUNTER — Other Ambulatory Visit: Payer: Self-pay | Admitting: Family Medicine

## 2024-05-24 DIAGNOSIS — M810 Age-related osteoporosis without current pathological fracture: Secondary | ICD-10-CM

## 2024-05-25 ENCOUNTER — Encounter: Payer: Self-pay | Admitting: Family Medicine

## 2024-05-25 ENCOUNTER — Ambulatory Visit (INDEPENDENT_AMBULATORY_CARE_PROVIDER_SITE_OTHER): Admitting: Family Medicine

## 2024-05-25 VITALS — BP 128/74 | HR 68 | Temp 98.2°F | Ht <= 58 in | Wt 141.4 lb

## 2024-05-25 DIAGNOSIS — H6991 Unspecified Eustachian tube disorder, right ear: Secondary | ICD-10-CM

## 2024-05-25 DIAGNOSIS — H938X1 Other specified disorders of right ear: Secondary | ICD-10-CM

## 2024-05-25 NOTE — Patient Instructions (Signed)
 Start using flonase , 2 gentle sniffs into each nostril, once daily. If after 2 weeks, you don't see any improvement, please follow-up with the ENT. Your tube is in the canal, and it is hard to evaluate the eardrum.  Your symptoms are likely contributed by eustachian tube dysfunction (related to the congestion), but the tube might also be contributing.

## 2024-05-25 NOTE — Progress Notes (Signed)
 Chief Complaint  Patient presents with   Ear Pain    Patient diagnosed with Flu a 04/24/24 now having right ear fullness.    She and her husband had the flu in early December.  Treated with Tamiflu , and is feeling better. She still has some mild ongoing congestion--occasional sore throat, runny nose (drainage is now clear). She has occasional PND, slight cough at night.  She presents today with complaint of her right ear feeling full. If she lays on the right side, she gets the sensation like there is drainage, but nothing drains. She hears some crackles in the right ear. Yawning, pulling on the ear--nothing seems to help open the ear up.  She doesn't use q-tips. Denies ear pain. Denies feeling like an ear infection (has had many in the past). She has had PE tubes placed many times in the past, and knows that one is still in the R ear canal (has been for a long time).  She has tried some coricidin periodically, doesn't help. Dayquil helped a little   PMH, PSH, SH reviewed  Outpatient Encounter Medications as of 05/25/2024  Medication Sig Note   acetaminophen  (TYLENOL ) 500 MG tablet Take 1,000 mg by mouth every 6 (six) hours as needed. 05/25/2024: Takes two every am   alendronate  (FOSAMAX ) 70 MG tablet TAKE 1 TABLET BY MOUTH EVERY 7 DAYS. TAKE WITH A FULL GLASS OF WATER  ON AN EMPTY STOMACH.    Alpha-D-Galactosidase (BEANO PO) Take 1 tablet by mouth daily.    ascorbic acid (VITAMIN C) 500 MG tablet Take 500 mg by mouth daily.    Blood Glucose Monitoring Suppl DEVI Use twice daily    Calcium Carb-Cholecalciferol (CALCIUM 600 + D PO) Take 1 tablet by mouth 2 (two) times daily.    cetirizine (ZYRTEC) 10 MG tablet Take 10 mg by mouth daily.    diphenhydramine -acetaminophen  (TYLENOL  PM) 25-500 MG TABS tablet Take 2 tablets by mouth at bedtime as needed. 05/25/2024: Takes 2 each night   Dulaglutide  (TRULICITY ) 3 MG/0.5ML SOAJ Inject 3 mg as directed once a week.    glucose blood test strip 1 each by  Other route in the morning and at bedtime. Use as instructed    Lancets 33G MISC Patient tests BID DX E11.9    lisinopril  (ZESTRIL ) 10 MG tablet Take 1 tablet (10 mg total) by mouth daily.    LYSINE PO Take 1 tablet by mouth daily as needed (cold sore). 05/25/2024: As needed   metFORMIN  (GLUCOPHAGE -XR) 500 MG 24 hr tablet Take 2 tablets (1,000 mg total) by mouth daily.    Multiple Vitamin (MULTIVITAMIN WITH MINERALS) TABS tablet Take 1 tablet by mouth 2 (two) times daily.    Multiple Vitamins-Minerals (HAIR SKIN AND NAILS FORMULA PO) Take 1 tablet by mouth at bedtime.    naproxen  sodium (ALEVE ) 220 MG tablet Take 440 mg by mouth daily as needed. 05/25/2024: As needed   Omega-3 1000 MG CAPS Take 1,000 mg by mouth 2 (two) times daily.    omeprazole  (PRILOSEC  OTC) 20 MG tablet Take 1 capsule up to twice daily for the next 1-2 weeks    PARoxetine  (PAXIL ) 20 MG tablet Take 1 tablet (20 mg total) by mouth daily.    simethicone (MYLICON) 125 MG chewable tablet Chew 125 mg by mouth every 6 (six) hours as needed for flatulence. 05/25/2024: Takes one every night   simvastatin  (ZOCOR ) 20 MG tablet TAKE 1 TABLET BY MOUTH EVERYDAY AT BEDTIME    valACYclovir  (VALTREX ) 1000  MG tablet TAKE 2 TABLETS AT ONSET OF COLD SORE AND REPEAT IN 12 HOURS (4 TABLETS PER EPISODE OF COLD SORE) 05/25/2024: As needed, did have cold sore with flu   vitamin B-12 (CYANOCOBALAMIN) 500 MCG tablet Take 500 mcg by mouth daily.    zolpidem  (AMBIEN  CR) 6.25 MG CR tablet Take 1 tablet (6.25 mg total) by mouth at bedtime.    [DISCONTINUED] diphenhydramine -acetaminophen  (TYLENOL  PM) 25-500 MG TABS tablet Take 2 tablets by mouth at bedtime as needed.    [DISCONTINUED] Dulaglutide  (TRULICITY ) 3 MG/0.5ML SOAJ Inject 3 mg as directed once a week.    [DISCONTINUED] HYDROcodone  bit-homatropine (HYCODAN) 5-1.5 MG/5ML syrup Take 5 mLs by mouth every 8 (eight) hours as needed for cough.    [DISCONTINUED] oseltamivir  (TAMIFLU ) 75 MG capsule Take 1 capsule  (75 mg total) by mouth 2 (two) times daily.    No facility-administered encounter medications on file as of 05/25/2024.   Allergies  Allergen Reactions   Heparin Other (See Comments)    Low platelets   Niacin And Related Other (See Comments)    Blotchy/itchy   Tessalon  [Benzonatate ] Diarrhea   Penicillins Rash and Other (See Comments)    Has patient had a PCN reaction causing immediate rash, facial/tongue/throat swelling, SOB or lightheadedness with hypotension: Unknown Has patient had a PCN reaction causing severe rash involving mucus membranes or skin necrosis: Unknown Has patient had a PCN reaction that required hospitalization: No Has patient had a PCN reaction occurring within the last 10 years: No If all of the above answers are NO, then may proceed with Cephalosporin use.     ROS: no f/c, URI symptoms were much worse with the flu, still has slight congestion. No shortness of breath, chest pain. No n/v/d.     PHYSICAL EXAM:  BP 128/74   Pulse 68   Temp 98.2 F (36.8 C) (Tympanic)   Ht 4' 8.5 (1.435 m)   Wt 141 lb 6.4 oz (64.1 kg)   BMI 31.14 kg/m   Wt Readings from Last 3 Encounters:  05/25/24 141 lb 6.4 oz (64.1 kg)  04/24/24 141 lb (64 kg)  03/09/24 141 lb (64 kg)   Pleasant, well-appearing female in no distress. Occ throat-clearing noted during visit HEENT: conjunctiva and sclera are clear, EOMI. OP is clear.  Nasal mucosa is only mildly edematous, no drainage. Sinuses are nontender L TM and EAC is normal. R ear--blue tube is present in the canal--more of it is visible compared to previously (the length of the canal). Only a small amount of cerumen.  Unable to truly visualize the TM.  EAC is normal. Neck: no lymphadenopathy or mass Heart: regular rate and rhythm Lungs: clear bilaterally Psych: normal mood, affect, hygiene and grooming Neuro: alert and oriented, cranial nerves grossly intact. Normal gait   ASSESSMENT/PLAN:  Plugged feeling in ear,  right - suspect related to ETD after recent illness.  Extruded tube can be contributing.  To start flonase , and f/u with ENT if sx persist/worsen  Eustachian tube dysfunction, right   Start using flonase , 2 gentle sniffs into each nostril, once daily. If after 2 weeks, you don't see any improvement, please follow-up with the ENT. Your tube is in the canal, and it is hard to evaluate the eardrum.  Your symptoms are likely contributed by eustachian tube dysfunction (related to the congestion), but the tube might also be contributing.

## 2024-05-27 ENCOUNTER — Ambulatory Visit: Admitting: Family Medicine

## 2024-06-02 ENCOUNTER — Ambulatory Visit (INDEPENDENT_AMBULATORY_CARE_PROVIDER_SITE_OTHER): Admitting: Family Medicine

## 2024-06-02 ENCOUNTER — Encounter: Payer: Self-pay | Admitting: Family Medicine

## 2024-06-02 ENCOUNTER — Ambulatory Visit

## 2024-06-02 VITALS — BP 102/74 | HR 82 | Ht <= 58 in | Wt 140.0 lb

## 2024-06-02 DIAGNOSIS — G8929 Other chronic pain: Secondary | ICD-10-CM

## 2024-06-02 DIAGNOSIS — M25551 Pain in right hip: Secondary | ICD-10-CM

## 2024-06-02 DIAGNOSIS — M545 Low back pain, unspecified: Secondary | ICD-10-CM

## 2024-06-02 NOTE — Patient Instructions (Addendum)
 Thank you for coming in today.   Please get an Xray today before you leave   A referral for physical therapy has been submitted. A representative from the physical therapy office will contact you to coordinate scheduling after confirming your benefits with your insurance provider. If you do not hear from the physical therapy office within the next 1-2 weeks, please let us  know.   We are ordering an MRI for you today.  The imaging office will be calling you to schedule your appointment after we obtain authorization from your insurance company.  If you have not heard from [facility] within a week, please call [phone] to schedule your MRI.   Please be sure you have signed up for MyChart so that we can get your results to you.  We will be in touch with you as soon as we can.  Please know, it can take up to 3-4 business days for the radiologist and Dr. Joane to have time to review the results and determine the best plan of care.  If there is something that appears to be surgical or needs a referral to other specialists we will let you know through MyChart or telephone.  Otherwise we will plan to schedule a follow up appointment with Dr. Joane once we have the results.    See you back to review MRI results.

## 2024-06-02 NOTE — Progress Notes (Signed)
 "        I, Leotis Batter, CMA acting as a scribe for Artist Lloyd, MD.  Julie Fischer is a 64 y.o. female who presents to [facility] today for exacerbation of her R hip pain. Pt was last seen by Dr. Lloyd on 03/09/24 and was given a R GT steroid injection and advised to cont HEP.  Today, pt reports a couple of weeks of relief after injection. Locates pain to lateral aspect of the hip. Denies radiating pai into the leg or the groin but does have occasional pain in the lower back. Notes occasional weakness in the leg but denies n/t. Taking tylenol  prn with minimal relief.   She has been doing home exercise program previously taught by physical therapy without much benefit.  Home exercise program ongoing for at least 6 weeks over the last 3 months.  Dx imaging: 03/26/23 R hip XR   Pertinent review of systems: No fevers or chills  Relevant historical information: Hypertension. Completed physical therapy for right hip pain a little over a year ago.   Exam:  BP 102/74   Pulse 82   Ht 4' 8.5 (1.435 m)   Wt 140 lb (63.5 kg)   SpO2 95%   BMI 30.83 kg/m  General: Well Developed, well nourished, and in no acute distress.   MSK: Right hip normal-appearing Normal motion. Tender palpation greater trochanter.  Hip abduction strength and external rotation strength are mildly diminished.    Lab and Radiology Results  X-ray images lumbar spine and right hip obtained today personally and independently interpreted.  Lumbar spine: DDD and anterior listhesis L5-S1.  No acute fractures are visible.  Right hip: Mild right hip DJD.  No acute fractures.  Await formal radiology review    Assessment and Plan: 64 y.o. female with chronic right lateral hip pain.  This is a chronic ongoing issue.  She did physical therapy formally about a year ago and has been doing home exercise program since.  About 3 months ago she had a greater trochanter injection that  unfortunately did not provide much benefit.  At this point she is failing typical conservative management.  Plan for updated x-rays and MRI right hip.  Anticipate recheck and potentially injection or even referral to orthopedic surgery based on results of MRI.  Is likely that she will need a tune up with physical therapy some going to go ahead and place a referral now but this may change based on MRI results.   PDMP not reviewed this encounter. Orders Placed This Encounter  Procedures   US  LIMITED JOINT SPACE STRUCTURES LOW RIGHT(NO LINKED CHARGES)    Reason for Exam (SYMPTOM  OR DIAGNOSIS REQUIRED):   right hip pain    Preferred imaging location?:   [facility] Lumbar Spine 2-3 Views    Standing Status:   Future    Number of Occurrences:   1    Expiration Date:   07/03/2024    Reason for Exam (SYMPTOM  OR DIAGNOSIS REQUIRED):   low back and right hip pain    Preferred imaging location?:   [facility]   DG HIP UNILAT W OR W/O PELVIS 2-3 VIEWS RIGHT    Standing Status:   Future    Number of Occurrences:   1    Expiration Date:   07/03/2024    Reason for Exam (SYMPTOM  OR DIAGNOSIS REQUIRED):   low back and right  hip pain    Preferred imaging location?:   [facility]   MR HIP RIGHT WO CONTRAST    Standing Status:   Future    Expiration Date:   06/02/2025    What is the patient's sedation requirement?:   No Sedation    Does the patient have a pacemaker or implanted devices?:   No    Preferred imaging location?:   [facility] (table limit-550lbs)   Ambulatory referral to Physical Therapy    Referral Priority:   Routine    Referral Type:   Physical Medicine    Referral Reason:   Specialty Services Required    Requested Specialty:   Physical Therapy    Number of Visits Requested:   1   No orders of the defined types were placed in this encounter.    Discussed warning signs or symptoms. Please see discharge instructions. Patient  expresses understanding.   The above documentation has been reviewed and is accurate and complete Artist Lloyd, M.D.   "

## 2024-06-04 ENCOUNTER — Encounter: Payer: Self-pay | Admitting: Family Medicine

## 2024-06-04 DIAGNOSIS — G4700 Insomnia, unspecified: Secondary | ICD-10-CM

## 2024-06-04 MED ORDER — ZOLPIDEM TARTRATE ER 6.25 MG PO TBCR: 6.2500 mg | EXTENDED_RELEASE_TABLET | Freq: Every day | ORAL | 2 refills | Status: AC

## 2024-06-09 ENCOUNTER — Ambulatory Visit: Payer: Self-pay | Admitting: Family Medicine

## 2024-06-09 ENCOUNTER — Encounter: Payer: Self-pay | Admitting: Family Medicine

## 2024-06-09 ENCOUNTER — Telehealth: Payer: Self-pay | Admitting: *Deleted

## 2024-06-09 NOTE — Telephone Encounter (Signed)
 Copied from CRM [id number]. Topic: Clinical - Prescription Issue >> Jun 09, 2024 10:44 AM [name] wrote: Reason for CRM: Burnard from [facility] pharmacy called to informed the provider that the pt insurance is rejecting zolpidem  (AMBIEN  CR) 6.25 MG CR tablet . The rejection does not provide an option for the pharmacy to submit a prior authorization. Burnard advised that the provider office will need to initiate the authorization. Burnard can be reached at [phone]   PA needed for patient's ambien  CR.

## 2024-06-09 NOTE — Progress Notes (Signed)
 Low back x-ray shows worsening arthritis.

## 2024-06-09 NOTE — Progress Notes (Signed)
 Hip x-ray shows medium arthritis in both hips

## 2024-06-10 ENCOUNTER — Telehealth: Payer: Self-pay

## 2024-06-10 ENCOUNTER — Other Ambulatory Visit (HOSPITAL_COMMUNITY): Payer: Self-pay

## 2024-06-10 NOTE — Telephone Encounter (Signed)
 Pharmacy Patient Advocate Encounter   Received notification from Physician's Office that prior authorization for Zolpidem  Tartrate ER 6.25MG  er tablets is required/requested.   Insurance verification completed.   The patient is insured through [facility].   Per test claim: PA required; PA submitted to above mentioned insurance via Latent Key/confirmation #/EOC [id number] Status is pending

## 2024-06-10 NOTE — Telephone Encounter (Signed)
 Noted.

## 2024-06-10 NOTE — Telephone Encounter (Signed)
 Pharmacy Patient Advocate Encounter  Received notification from [facility] that Prior Authorization for  Zolpidem  Tartrate ER 6.25MG  er tablets  has been APPROVED from 05/27/2024 to 05/27/2097. Ran test claim, Copay is $3.28. This test claim was processed through [facility]- copay amounts may vary at other pharmacies due to pharmacy/plan contracts, or as the patient moves through the different stages of their insurance plan.   PA #/Case ID/Reference #: [id number]

## 2024-06-16 ENCOUNTER — Other Ambulatory Visit

## 2024-06-18 ENCOUNTER — Encounter: Payer: Self-pay | Admitting: Family Medicine

## 2024-06-19 ENCOUNTER — Ambulatory Visit: Admitting: Physical Therapy

## 2024-06-19 DIAGNOSIS — M25551 Pain in right hip: Secondary | ICD-10-CM

## 2024-06-19 NOTE — Therapy (Unsigned)
 " OUTPATIENT PHYSICAL THERAPY LOWER EXTREMITY Treatment   Patient Name: Julie Fischer MRN: [id number] DOB:19-Feb-1961, 64 y.o., female Today's Date: 06/22/2024  END OF SESSION:  PT End of Session - 06/22/24 1529     Visit Number 1    Number of Visits 16    Date for Recertification  08/14/24    Authorization Type Medicare    PT Start Time 1015    PT Stop Time 1055    PT Time Calculation (min) 40 min    Activity Tolerance Patient tolerated treatment well    Behavior During Therapy [facility] for tasks assessed/performed                   Past Medical History:  Diagnosis Date   Allergy    fall seasonal   Anxiety    Blood transfusion    Platelet transfusion when on heparin   Breast cancer (HCC) 2001   R breast(lumpectomy,chemo,radiation,tamoxifen)   C. difficile diarrhea 02/2016   treated with Flagyl    Complication of anesthesia    gets Lavone going under she has been told   COVID-19 09/2020   Depression    treated   Diabetes mellitus    type 2   GERD (gastroesophageal reflux disease)    Hyperlipidemia    Hypertension    resolved after weight loss surgery; recurred 2013   Insomnia chronic   Iron deficiency    h/o   Microalbuminuria    h/o   OA (osteoarthritis) of knee    Osteoporosis    Personal history of chemotherapy    Personal history of radiation therapy    Plantar fasciitis (07') DrRegal   s/p surgical release 01/2011   Sleep apnea    resolved after weight loss surgery; recurrent with weight gain; on CPAP   Vitamin D  deficiency    Past Surgical History:  Procedure Laterality Date   APPENDECTOMY     BREAST LUMPECTOMY Right 11/1999   COLONOSCOPY  2002, 2012   cuboid stress fracture Right 12/2002   FOOT SURGERY Left 2008   GASTRIC ROUX-EN-Y  (DrNewman) 3/04   left shoulder fracture repair  (DrMortensen) 12/05   MYRINGOTOMY     tubes B/L, multiple sets   plantar fasciitis release Right 01/30/2011   Dr. Magdalen   TONSILLECTOMY AND ADENOIDECTOMY      TOTAL KNEE ARTHROPLASTY Left 04/08/2017   Procedure: LEFT TOTAL KNEE ARTHROPLASTY;  Surgeon: Melodi Lerner, MD;  Location: [facility];  Service: Orthopedics;  Laterality: Left;   TOTAL KNEE ARTHROPLASTY Right 01/06/2018   Procedure: RIGHT TOTAL KNEE ARTHROPLASTY;  Surgeon: Melodi Lerner, MD;  Location: [facility];  Service: Orthopedics;  Laterality: Right;   TOTAL VAGINAL HYSTERECTOMY  2000   and RSO(endometriosis)   Patient Active Problem List   Diagnosis Date Noted   Right hip pain 05/25/2020   Fall 05/25/2020   Frequent urination 04/10/2019   Dysuria 04/10/2019   Vaginal dryness 04/10/2019   Vaginitis, atrophic 04/10/2019   Post-menopausal 04/10/2019   S/P hysterectomy 04/10/2019   Stiffness of right knee 01/10/2018   Family hx of colon cancer 06/05/2017   S/P TKR (total knee replacement), left 06/05/2017   Depressive disorder 05/24/2017   OA (osteoarthritis) of knee 04/08/2017   Eustachian tube dysfunction, right 03/19/2017   Mixed conductive and sensorineural hearing loss of right ear with restricted hearing of left ear 03/19/2017   History of bariatric surgery 07/29/2013   Obesity (BMI 30-39.9) 07/29/2013   Osteoporosis 01/28/2013   Allergic rhinitis  01/28/2013   Anemia, iron deficiency 07/30/2012   Impaired fasting glucose 12/10/2011   Essential hypertension, benign 12/10/2011   Pure hypercholesterolemia 11/15/2010   Insomnia 11/15/2010   Depression, major, in remission 11/15/2010   Osteopenia 11/15/2010   History of breast cancer 11/15/2010    PCP: Randol Dawes, MD  REFERRING PROVIDER: Joane Artist RAMAN, MD  REFERRING DIAG: Right hip pain,  THERAPY DIAG:  Pain in right hip  Rationale for Evaluation and Treatment: Rehabilitation  ONSET DATE: Two years   SUBJECTIVE:   SUBJECTIVE STATEMENT:  R hip continues to be bothersome. Pain with sleeping, pain with carrying any weight, with walking, pushing cart, vacuum, Stairs.  Has to go up full flight to bedroom 2-3  times/day.  Taking some alieve for pain at night.  She was seen in Pt previously, with mild relief.   PERTINENT HISTORY: OA R hip, osteopenia, osteoporosis, DM, HTN,   PAIN:  Are you having pain? Yes: NPRS scale eval: up to 0-8/10 with activity  , sitting: 2/10 Pain location:  R hip,  Pain description: sore Aggravating factors: Walking, navigating stairs, walking her dog, mopping, vacuuming, sleeping on her R hip, and carrying > 2# Relieving factors: none stated   Are you having pain? Yes: NPRS scale eval: 5/10 Pain location:  low back Pain description: sticking it with pins  Aggravating factors: Walking, navigating stairs, walking her dog, mopping, vacuuming,  Relieving factors: none stated   PRECAUTIONS: None  RED FLAGS: None   WEIGHT BEARING RESTRICTIONS: No  FALLS:  Has patient fallen in last 6 months? No  LIVING ENVIRONMENT: Lives with: lives with their family and lives with their spouse Lives in: House/apartment Stairs: Yes: Internal: 1 flight steps;   OCCUPATION: Retired   PLOF: Independent  PATIENT GOALS: Reduce pain with walking, carrying items >2#, navigating stairs, mopping, vacuuming,    NEXT MD VISIT: MRI 06/21/24,   OBJECTIVE:   DIAGNOSTIC FINDINGS:     PATIENT SURVEYS:    COGNITION: Overall cognitive status: Within functional limits for tasks assessed      POSTURE:   PALPATION: Tenderness in R glute med, piriformis. No pain in gr troch today.    LOWER EXTREMITY MMT:  MMT Right 06/19/24 Left 06/19/24  Hip flexion 4 4+  Hip extension    Hip abduction 4 4+  Hip adduction    Hip internal rotation 4 4  Hip external rotation 4 4  Knee flexion 5 5  Knee extension 5 5  Ankle dorsiflexion    Ankle plantarflexion    Ankle inversion    Ankle eversion     (Blank rows = not tested) LUMBAR ROM:   Active  A/PROM  eval   Flexion Mild limitation   Extension Mild limitation   Right lateral flexion WFL    Left lateral flexion WFL   Right  rotation WFL   Left rotation WFL    (Blank rows = not tested)   LOWER EXTREMITY SPECIAL TESTS:    FUNCTIONAL TESTS:    GAIT: Distance walked: 150 ft Assistive device utilized: None Level of assistance: Complete Independence Comments: reduced trunk rotation   TODAY'S TREATMENT:  DATE:   Eval:  Ther ex:  See below for HEP    PATIENT EDUCATION:  PATIENT EDUCATION:   Education details: reviewed and updated HEP Person educated: Patient Education method: Explanation, Demonstration, Tactile cues, Verbal cues, and Handouts Education comprehension: verbalized understanding, returned demonstration, verbal cues required, tactile cues required, and needs further education   HOME EXERCISE PROGRAM: Access Code: [email or web address]   ASSESSMENT:  CLINICAL IMPRESSION:  06/19/2024  Pt presents with primary complaint of ongoing pain in R hip. Previously, she had symptoms of bursitis, but pain has been ongoing and bothersome. Today she has most pain in glute musculature, with no pain in gr troch. She has decreased ability for standing, walking, and stair activities, due to pain.  Pt would benefit from skilled PT to address the above impairments. She will benefit from education on hip strength and core strength. She also some symptoms and pain stemming from low back. She is having hip MRI later this week. Pt to benefit from skilled PT to improve deficits and pain.   OBJECTIVE IMPAIRMENTS: Abnormal gait, decreased balance, decreased coordination, decreased endurance, difficulty walking, decreased ROM, decreased strength, hypomobility, impaired flexibility, and pain.   ACTIVITY LIMITATIONS: carrying, lifting, bending, sitting, sleeping, stairs, and locomotion level  PARTICIPATION LIMITATIONS: cleaning, laundry, driving, and community activity  PERSONAL FACTORS: Past/current  experiences and 3+ comorbidities: R hip OA, osteopenia, osteoporosis, DM, and HTN,  are also affecting patient's functional outcome.   REHAB POTENTIAL: Good  CLINICAL DECISION MAKING: Stable/uncomplicated  EVALUATION COMPLEXITY: Low   GOALS: Goals reviewed with patient? Yes  SHORT TERM GOALS:   Target date:  07/13/2024    Pt will be independent in initial HEP  Goal status: INITIAL  2.  Pt will report 25% improvement in her ability to mop/vacuum. Goal status: INITIAL    LONG TERM GOALS:            Target date: 08/17/2024    Pt will be independent in final HEP.   Goal status: INITIAL   2.  Pt will demo ability to carry at least 10# with pain less than 3/10 to promote return to ADLs. Goal status: INITIAL  3.  Pt will report 75% improvement in the ability to walk for at least 20 mins.  Goal status: INITIAL  4.  Pt will demo improved hip strength to be at least 4+/5 to improve pain and stability.  Goal status: INITIAL   5. Pt to report ability for IADLS with pain 0-3/10 .   Goal status: INITIAL      PLAN:  PT FREQUENCY: 1-2x/week  PT DURATION: 8 weeks   PLANNED INTERVENTIONS: 97110-Therapeutic exercises, 97530- Therapeutic activity, 97112- Neuromuscular re-education, 97535- Self Care, [id number]- Manual therapy, [phone]- Gait training, [phone]- Aquatic Therapy, Patient/Family education, Balance training, Stair training, Taping, Dry Needling, Joint mobilization, Spinal mobilization, Vestibular training, Cryotherapy, and Moist heat  PLAN FOR NEXT SESSION:  hip strength as tolerated, core strength, back mobility, await MRI results for R hip.    Tinnie Don, PT, DPT 3:30 PM  06/22/24    "

## 2024-06-21 ENCOUNTER — Other Ambulatory Visit

## 2024-06-22 ENCOUNTER — Encounter: Admitting: Physical Therapy

## 2024-06-22 ENCOUNTER — Encounter: Payer: Self-pay | Admitting: Physical Therapy

## 2024-06-23 ENCOUNTER — Ambulatory Visit: Admitting: Physical Therapy

## 2024-06-23 ENCOUNTER — Encounter: Payer: Self-pay | Admitting: Physical Therapy

## 2024-06-23 ENCOUNTER — Ambulatory Visit
Admission: RE | Admit: 2024-06-23 | Discharge: 2024-06-23 | Disposition: A | Source: Ambulatory Visit | Attending: Family Medicine | Admitting: Family Medicine

## 2024-06-23 DIAGNOSIS — M25551 Pain in right hip: Secondary | ICD-10-CM | POA: Diagnosis not present

## 2024-06-23 DIAGNOSIS — M25651 Stiffness of right hip, not elsewhere classified: Secondary | ICD-10-CM

## 2024-06-30 ENCOUNTER — Encounter: Admitting: Physical Therapy

## 2024-07-08 ENCOUNTER — Encounter: Admitting: Physical Therapy

## 2024-07-15 ENCOUNTER — Encounter: Admitting: Physical Therapy

## 2024-07-22 ENCOUNTER — Encounter: Admitting: Physical Therapy

## 2024-07-29 ENCOUNTER — Encounter: Admitting: Physical Therapy

## 2024-07-30 ENCOUNTER — Ambulatory Visit: Admitting: Family Medicine

## 2024-07-31 ENCOUNTER — Other Ambulatory Visit: Payer: Self-pay

## 2024-08-03 ENCOUNTER — Ambulatory Visit: Payer: Self-pay | Admitting: Family Medicine
